# Patient Record
Sex: Female | Born: 1940
Health system: Southern US, Community
[De-identification: ages and names within clinical notes are randomized; demographics above are authoritative.]

## PROBLEM LIST (undated history)

## (undated) DIAGNOSIS — C801 Malignant (primary) neoplasm, unspecified: Secondary | ICD-10-CM

## (undated) DIAGNOSIS — C189 Malignant neoplasm of colon, unspecified: Secondary | ICD-10-CM

## (undated) DIAGNOSIS — D649 Anemia, unspecified: Secondary | ICD-10-CM

## (undated) DIAGNOSIS — Z923 Personal history of irradiation: Secondary | ICD-10-CM

## (undated) DIAGNOSIS — C50919 Malignant neoplasm of unspecified site of unspecified female breast: Secondary | ICD-10-CM

## (undated) DIAGNOSIS — C20 Malignant neoplasm of rectum: Secondary | ICD-10-CM

## (undated) DIAGNOSIS — F329 Major depressive disorder, single episode, unspecified: Secondary | ICD-10-CM

## (undated) DIAGNOSIS — Z9221 Personal history of antineoplastic chemotherapy: Secondary | ICD-10-CM

## (undated) DIAGNOSIS — F32A Depression, unspecified: Secondary | ICD-10-CM

## (undated) DIAGNOSIS — M1990 Unspecified osteoarthritis, unspecified site: Secondary | ICD-10-CM

## (undated) DIAGNOSIS — I10 Essential (primary) hypertension: Secondary | ICD-10-CM

## (undated) HISTORY — DX: Depression, unspecified: F32.A

## (undated) HISTORY — DX: Essential (primary) hypertension: I10

## (undated) HISTORY — PX: TUMOR EXCISION: SHX421

## (undated) HISTORY — PX: ABDOMINAL HYSTERECTOMY: SHX81

## (undated) HISTORY — DX: Unspecified osteoarthritis, unspecified site: M19.90

## (undated) HISTORY — DX: Major depressive disorder, single episode, unspecified: F32.9

## (undated) HISTORY — DX: Malignant neoplasm of rectum: C20

## (undated) HISTORY — DX: Anemia, unspecified: D64.9

## (undated) HISTORY — DX: Malignant (primary) neoplasm, unspecified: C80.1

---

## 1999-08-14 HISTORY — PX: MASTECTOMY MODIFIED RADICAL: SUR848

## 1999-08-14 HISTORY — PX: BREAST SURGERY: SHX581

## 2000-03-13 DIAGNOSIS — C50919 Malignant neoplasm of unspecified site of unspecified female breast: Secondary | ICD-10-CM

## 2000-03-13 HISTORY — DX: Malignant neoplasm of unspecified site of unspecified female breast: C50.919

## 2001-08-13 HISTORY — PX: MENISCUS DEBRIDEMENT: SHX5178

## 2002-04-13 LAB — HM COLONOSCOPY

## 2002-08-13 HISTORY — PX: BREAST BIOPSY: SHX20

## 2004-12-28 LAB — HM DEXA SCAN

## 2014-08-13 DIAGNOSIS — Z87898 Personal history of other specified conditions: Secondary | ICD-10-CM | POA: Diagnosis not present

## 2014-09-13 DIAGNOSIS — Z87898 Personal history of other specified conditions: Secondary | ICD-10-CM | POA: Diagnosis not present

## 2014-10-12 DIAGNOSIS — Z87898 Personal history of other specified conditions: Secondary | ICD-10-CM | POA: Diagnosis not present

## 2014-11-12 DIAGNOSIS — Z87898 Personal history of other specified conditions: Secondary | ICD-10-CM | POA: Diagnosis not present

## 2014-12-03 DIAGNOSIS — F329 Major depressive disorder, single episode, unspecified: Secondary | ICD-10-CM | POA: Diagnosis not present

## 2014-12-03 DIAGNOSIS — F909 Attention-deficit hyperactivity disorder, unspecified type: Secondary | ICD-10-CM | POA: Diagnosis not present

## 2014-12-12 DIAGNOSIS — Z87898 Personal history of other specified conditions: Secondary | ICD-10-CM | POA: Diagnosis not present

## 2015-01-06 DIAGNOSIS — H2513 Age-related nuclear cataract, bilateral: Secondary | ICD-10-CM | POA: Diagnosis not present

## 2015-01-06 DIAGNOSIS — H02229 Mechanical lagophthalmos unspecified eye, unspecified eyelid: Secondary | ICD-10-CM | POA: Diagnosis not present

## 2015-01-06 DIAGNOSIS — H524 Presbyopia: Secondary | ICD-10-CM | POA: Diagnosis not present

## 2015-01-06 DIAGNOSIS — H16213 Exposure keratoconjunctivitis, bilateral: Secondary | ICD-10-CM | POA: Diagnosis not present

## 2015-01-12 DIAGNOSIS — Z87898 Personal history of other specified conditions: Secondary | ICD-10-CM | POA: Diagnosis not present

## 2015-02-11 DIAGNOSIS — Z87898 Personal history of other specified conditions: Secondary | ICD-10-CM | POA: Diagnosis not present

## 2015-03-14 DIAGNOSIS — Z87898 Personal history of other specified conditions: Secondary | ICD-10-CM | POA: Diagnosis not present

## 2015-04-14 DIAGNOSIS — Z87898 Personal history of other specified conditions: Secondary | ICD-10-CM | POA: Diagnosis not present

## 2015-05-14 DIAGNOSIS — Z87898 Personal history of other specified conditions: Secondary | ICD-10-CM | POA: Diagnosis not present

## 2015-06-14 DIAGNOSIS — Z87898 Personal history of other specified conditions: Secondary | ICD-10-CM | POA: Diagnosis not present

## 2015-07-14 DIAGNOSIS — Z87898 Personal history of other specified conditions: Secondary | ICD-10-CM | POA: Diagnosis not present

## 2015-08-14 HISTORY — PX: CATARACT EXTRACTION: SUR2

## 2016-02-21 DIAGNOSIS — H01009 Unspecified blepharitis unspecified eye, unspecified eyelid: Secondary | ICD-10-CM | POA: Diagnosis not present

## 2016-02-21 DIAGNOSIS — H25813 Combined forms of age-related cataract, bilateral: Secondary | ICD-10-CM | POA: Diagnosis not present

## 2016-02-21 DIAGNOSIS — H1131 Conjunctival hemorrhage, right eye: Secondary | ICD-10-CM | POA: Diagnosis not present

## 2016-02-21 DIAGNOSIS — H524 Presbyopia: Secondary | ICD-10-CM | POA: Diagnosis not present

## 2016-03-19 DIAGNOSIS — H2511 Age-related nuclear cataract, right eye: Secondary | ICD-10-CM | POA: Diagnosis not present

## 2016-03-19 DIAGNOSIS — H2513 Age-related nuclear cataract, bilateral: Secondary | ICD-10-CM | POA: Diagnosis not present

## 2016-05-01 DIAGNOSIS — Z961 Presence of intraocular lens: Secondary | ICD-10-CM | POA: Diagnosis not present

## 2016-05-01 DIAGNOSIS — H2511 Age-related nuclear cataract, right eye: Secondary | ICD-10-CM | POA: Diagnosis not present

## 2016-05-02 DIAGNOSIS — H2512 Age-related nuclear cataract, left eye: Secondary | ICD-10-CM | POA: Diagnosis not present

## 2016-05-08 DIAGNOSIS — Z961 Presence of intraocular lens: Secondary | ICD-10-CM | POA: Diagnosis not present

## 2016-05-08 DIAGNOSIS — H2512 Age-related nuclear cataract, left eye: Secondary | ICD-10-CM | POA: Diagnosis not present

## 2016-05-10 ENCOUNTER — Encounter: Payer: Self-pay | Admitting: Family Medicine

## 2016-05-10 ENCOUNTER — Ambulatory Visit (INDEPENDENT_AMBULATORY_CARE_PROVIDER_SITE_OTHER): Payer: Medicare Other | Admitting: Family Medicine

## 2016-05-10 VITALS — BP 168/95 | HR 88 | Ht 62.0 in | Wt 131.2 lb

## 2016-05-10 DIAGNOSIS — F329 Major depressive disorder, single episode, unspecified: Secondary | ICD-10-CM | POA: Diagnosis not present

## 2016-05-10 DIAGNOSIS — F32A Depression, unspecified: Secondary | ICD-10-CM

## 2016-05-10 DIAGNOSIS — C801 Malignant (primary) neoplasm, unspecified: Secondary | ICD-10-CM

## 2016-05-10 DIAGNOSIS — R030 Elevated blood-pressure reading, without diagnosis of hypertension: Secondary | ICD-10-CM

## 2016-05-10 DIAGNOSIS — Q8789 Other specified congenital malformation syndromes, not elsewhere classified: Secondary | ICD-10-CM

## 2016-05-10 DIAGNOSIS — H905 Unspecified sensorineural hearing loss: Secondary | ICD-10-CM | POA: Insufficient documentation

## 2016-05-10 DIAGNOSIS — I10 Essential (primary) hypertension: Secondary | ICD-10-CM | POA: Insufficient documentation

## 2016-05-10 DIAGNOSIS — G608 Other hereditary and idiopathic neuropathies: Secondary | ICD-10-CM | POA: Insufficient documentation

## 2016-05-10 DIAGNOSIS — M25511 Pain in right shoulder: Secondary | ICD-10-CM

## 2016-05-10 DIAGNOSIS — Q789 Osteochondrodysplasia, unspecified: Secondary | ICD-10-CM

## 2016-05-10 DIAGNOSIS — M25519 Pain in unspecified shoulder: Secondary | ICD-10-CM | POA: Insufficient documentation

## 2016-05-10 DIAGNOSIS — F339 Major depressive disorder, recurrent, unspecified: Secondary | ICD-10-CM | POA: Insufficient documentation

## 2016-05-10 DIAGNOSIS — Q120 Congenital cataract: Secondary | ICD-10-CM

## 2016-05-10 MED ORDER — AMPHETAMINE-DEXTROAMPHETAMINE 10 MG PO TABS: 10.0000 mg | ORAL_TABLET | Freq: Two times a day (BID) | ORAL | 0 refills | Status: DC

## 2016-05-10 MED ORDER — SERTRALINE HCL 25 MG PO TABS: 12.5000 mg | ORAL_TABLET | Freq: Every day | ORAL | 0 refills | Status: DC

## 2016-05-10 NOTE — Assessment & Plan Note (Signed)
She has a history of right shoulder pain-chronic. Had injections in the remote past. Not too bad now. We'll readdress in the future as needed.

## 2016-05-10 NOTE — Assessment & Plan Note (Addendum)
Meds refilled. We will start slow and go up as needed.  Verbal contract entered between [place] that she will not hurt herself. If she has any of these feelings show down 911 or call our office if we are available. She agrees not to hurt herself in any way or another.  Labs ordered.

## 2016-05-10 NOTE — Patient Instructions (Addendum)
[employer]- silver sneakers program.  Denise Wolfe.   Please check BP's at home if you can  Walk daily to a goal of 38min - even if it's 44min Twice daily.     Persistent Depressive Disorder Persistent Depressive Disorder (PDD), also known as long-term (chronic) depression, is depression that lasts for at least 2 years in adults and at least 1 year in children and adolescents. PDD varies in severity from mild chronic depression (previously called dysthymia) to more severe chronic depression (previously called chronic major depressive disorder).  Mild PDD usually starts in the teenage years. People with mild PDD usually enjoy some or most aspects of their life even though they have low self-esteem and some physical symptoms of depression. Mild PDD can interfere with personal relationships, performance at school or work, and physical health. Mild PDD is not associated with thoughts of harming oneself. However, more than one half of people with mild PDD develop more severe PDD. More severe PDD usually starts in middle adulthood. It often is triggered by a negative life event (for example, loss of a loved one or loss of a job). People with more severe PDD usually have trouble enjoying life and having fun. More severe PDD usually interferes with personal relationships, school or work, and physical health. More severe PDD is often associated with thoughts or attempts of harming oneself or killing oneself (suicide). Some people with the most severe PDD lose touch with reality (psychosis). They may have false beliefs that others are dangerous (paranoid delusions). They may also see or hear things that are not real (hallucinations). CAUSES The exact cause of PDD is not known. However, often one or more of the following factors are associated with people with PDD:   A family history of depression.  Abnormally low levels of certain brain chemicals.   Traumatic events in childhood, especially the loss of a  parent or emotional abuse.  Being under a lot of stress (in the form of upsetting life experiences or losses).   History of a chronic physical illness. SYMPTOMS Symptoms of PDD vary according to the severity. A diagnosis of mild PDD requires depressed mood, most of the day more days than not, and two or more of the following symptoms:   Eating too much or not eating enough.  Low energy or fatigue.   Sleeping too much or not sleeping enough.   Feelings of hopelessness.   Difficulty thinking clearly or making decisions.   Low self-esteem.  People with more severe PDD experience depressed mood, most of the day nearly every day, and also have one or more of the following symptoms:  Inability to have fun or experience pleasure.  Feeling of restless and anxiousness rather than tiredness or fatigue.   Feelings of worthlessness or excessive guilt.  Recurring thoughts of death or suicidal ideation with or without a specific plan. DIAGNOSIS The diagnosis of PDD is based on the length of your depressive symptoms. Your caregiver will ask you questions to determine the severity of your chronic depressive symptoms. Your caregiver also may order blood tests and ask questions about your medical history and use of drugs and alcohol to rule out specific causes of your depressive symptoms. Your caregiver may refer you to a mental health care professional for further evaluation and treatment. TREATMENT Most people with PDD get better with treatment. Treatment for PDD may include medicine or talk therapy (psychotherapy). People with mild PDD often get better with psychotherapy alone. People with more severe PDD usually  do better with a combination of medicine and psychotherapy.   This information is not intended to replace advice given to you by your health care provider. Make sure you discuss any questions you have with your health care provider.   Document Released: 07/16/2012 Document  Revised: 08/20/2014 Document Reviewed: 07/16/2012 Elsevier Interactive Patient Education [employer].

## 2016-05-10 NOTE — Progress Notes (Signed)
New patient office visit note:  Impression and Recommendations:    1. Elevated blood pressure (not hypertension)   2. History of breast Cancer- radical mastectomy left ([place])   3. Depression   4. Cataract   5. Pain in joint of right shoulder      History of breast Cancer mother- died breast Ca in 22's.  Patient had breast cancer, diagnosed at age 75- status post chemo and radiation- had a radical L mastectomy   Elevated blood pressure (not hypertension) Patient denies history of hypertension, asked to check at home and we will reassess at next office visit. Especially since patient is very emotional in the office today, likely mediated by emotions and sympathetic response.  Depression Meds refilled. We will start slow and go up as needed.  Labs ordered.  The patient was counseled, risk factors were discussed, anticipatory guidance given.  Gross side effects, risk and benefits, and alternatives of medications discussed with patient.  Patient is aware that all medications have potential side effects and we are unable to predict every side effect or drug-drug interaction that may occur.  Expresses verbal understanding and consents to current therapy plan and treatment regimen.  Orders Placed This Encounter  Procedures  . CBC with Differential/Platelet  . COMPLETE METABOLIC PANEL WITH GFR  . Lipid panel  . Hemoglobin A1c  . T4, free  . TSH  . VITAMIN D 25 Hydroxy (Vit-D Deficiency, Fractures)  . Vitamin B12    New Prescriptions   AMPHETAMINE-DEXTROAMPHETAMINE (ADDERALL) 10 MG TABLET    Take 1 tablet (10 mg total) by mouth 2 (two) times daily.   SERTRALINE (ZOLOFT) 25 MG TABLET    Take 0.5 tablets (12.5 mg total) by mouth daily. For one week then inc to 1 tab QD    Modified Medications   No medications on file    Discontinued Medications   No medications on file    Return in about 4 weeks (around 06/07/2016) for come for fastign wrk-bldwrk and then OV with  me 4wks current medical issues- reck BP and mood meds.  Please see AVS handed out to patient at the end of our visit for further patient instructions/ counseling done pertaining to today's office visit.    Note: This document was prepared using Dragon voice recognition software and may include unintentional dictation errors.  ----------------------------------------------------------------------------------------------------------------------    Subjective:   Chief complaint: Depression  HPI: Denise Wolfe is a pleasant 75 y.o. female who presents to [place] today to review their medical history with me and establish care.   I asked the patient to review their chronic problem list with me to ensure everything was updated and accurate.    From [place]- married 34 yrs- lost husband April '16; lives alone, retird fashion designer  Patient denies she ever had a history of hypertension in the past.  Severely Depressed: cbt- in past for 4-5 yrs---> many yrs ago.  Has a long history of depression --> multiple different meds---> been through several- not sure of the names.  Last she can recall she was on Sertraline 100mg  QD and adderall 10 mg twice a day to help with energy since she did not even feel like getting out of bed. At least the Adderall did help and motivate her to want to move/ to "get up and go".   None since APril '17.  She has been worsening in her depression especially within 8 weeks  after stopping the medicine. She actually did have one thought of killing herself, driving into the middle of a train track and waiting to the train cane. However this was fleeting and patient denies that she has these thoughts currently.  PHQ9 performed  Depression screen PHQ 2/9 05/10/2016  Decreased Interest 3  Down, Depressed, Hopeless 3  PHQ - 2 Score 6  Altered sleeping 3  Tired, decreased energy 3  Change in appetite 3  Feeling bad or failure about yourself  0    Trouble concentrating 3  Moving slowly or fidgety/restless 0  Suicidal thoughts 1  PHQ-9 Score 19  Difficult doing work/chores Somewhat difficult    PCP- Dr Sharilyn Sites- [place], [place],     Wt Readings from Last 3 Encounters:  05/10/16 131 lb 3.2 oz (59.5 kg)   BP Readings from Last 3 Encounters:  05/10/16 (!) 168/95   Pulse Readings from Last 3 Encounters:  05/10/16 88   BMI Readings from Last 3 Encounters:  05/10/16 24.00 kg/m    Patient Active Problem List   Diagnosis Date Noted  . History of breast Cancer 05/10/2016  . Depression 05/10/2016  . Cataract 05/10/2016  . Pain in joint, shoulder region 05/10/2016  . Elevated blood pressure (not hypertension) 05/10/2016     Past Medical History:  Diagnosis Date  . Anemia    during childhood  . Cancer [facility])    breast, age 19  . Depression      Past Surgical History:  Procedure Laterality Date  . ABDOMINAL HYSTERECTOMY    . BREAST SURGERY    . CATARACT EXTRACTION Bilateral   . MASTECTOMY MODIFIED RADICAL Left   . MENISCUS DEBRIDEMENT Right   . TUMOR EXCISION Right    thigh area     Family History  Problem Relation Age of Onset  . Cancer Mother 62    breast  . Heart disease Father      History  Drug Use No    History  Alcohol Use  . 8.4 oz/week  . 14 Glasses of wine per week    History  Smoking Status  . Never Smoker  Smokeless Tobacco  . Never Used    Patient's Medications  New Prescriptions   AMPHETAMINE-DEXTROAMPHETAMINE (ADDERALL) 10 MG TABLET    Take 1 tablet (10 mg total) by mouth 2 (two) times daily.   SERTRALINE (ZOLOFT) 25 MG TABLET    Take 0.5 tablets (12.5 mg total) by mouth daily. For one week then inc to 1 tab QD  Previous Medications   BESIVANCE 0.6 % SUSP    Place 1 drop into both eyes 3 (three) times daily.   DUREZOL 0.05 % EMUL    Place 1 drop into both eyes 3 (three) times daily.   PROLENSA 0.07 % SOLN    Place 1 drop into both eyes at bedtime.  Modified Medications    No medications on file  Discontinued Medications   No medications on file    Allergies: Ibuprofen  Review of Systems  Constitutional: Positive for weight loss. Negative for chills, diaphoresis, fever and malaise/fatigue.  HENT: Negative.  Negative for congestion, sore throat and tinnitus.   Eyes: Negative.  Negative for blurred vision, double vision and photophobia.  Respiratory: Negative.  Negative for cough and wheezing.   Cardiovascular: Negative.  Negative for chest pain and palpitations.  Gastrointestinal: Negative.  Negative for blood in stool, diarrhea, nausea and vomiting.  Genitourinary: Negative.  Negative for dysuria, frequency and urgency.  Musculoskeletal: Positive for joint pain and myalgias.  Skin: Negative.  Negative for itching and rash.  Neurological: Negative.  Negative for dizziness, focal weakness, weakness and headaches.  Endo/Heme/Allergies: Negative.  Negative for environmental allergies and polydipsia. Does not bruise/bleed easily.  Psychiatric/Behavioral: Positive for depression. Negative for memory loss. The patient is not nervous/anxious and does not have insomnia.        Sleep disturbance     Objective:   Blood pressure (!) 168/95, pulse 88, height 5\' 2"  (1.575 m), weight 131 lb 3.2 oz (59.5 kg). Body mass index is 24 kg/m. General: Well Developed, well nourished, and in no acute distress.  Neuro: Alert and oriented x3, extra-ocular muscles intact, sensation grossly intact.  HEENT: Normocephalic, atraumatic, pupils equal round reactive to light, neck supple Skin: no gross suspicious lesions or rashes  Cardiac: Regular rate and rhythm, no murmurs rubs or gallops.  Respiratory: Essentially clear to auscultation bilaterally. Not using accessory muscles, speaking in full sentences.  Abdominal: Soft, not grossly distended Musculoskeletal: Ambulates w/o diff, FROM * 4 ext.  Vasc: less 2 sec cap RF, warm and pink  Psych:  No HI/SI, depressed mood,  tearful in the office, flat affect

## 2016-05-10 NOTE — Assessment & Plan Note (Addendum)
mother- died breast Ca in 31's.  Patient had breast cancer, diagnosed at age 75- status post chemo and radiation- had a radical L mastectomy

## 2016-05-10 NOTE — Assessment & Plan Note (Signed)
Patient denies history of hypertension, asked to check at home and we will reassess at next office visit. Especially since patient is very emotional in the office today, likely mediated by emotions and sympathetic response.

## 2016-05-10 NOTE — Assessment & Plan Note (Deleted)
cbt- in past for 4-5 yrs---> many yrs ago.   meds---> been through several-  Sertraline 100mg  QD and adderall.  None since APril.

## 2016-07-11 ENCOUNTER — Other Ambulatory Visit (INDEPENDENT_AMBULATORY_CARE_PROVIDER_SITE_OTHER): Payer: Medicare Other

## 2016-07-11 DIAGNOSIS — C801 Malignant (primary) neoplasm, unspecified: Secondary | ICD-10-CM

## 2016-07-11 DIAGNOSIS — F329 Major depressive disorder, single episode, unspecified: Secondary | ICD-10-CM

## 2016-07-11 DIAGNOSIS — E559 Vitamin D deficiency, unspecified: Secondary | ICD-10-CM | POA: Diagnosis not present

## 2016-07-11 DIAGNOSIS — F32A Depression, unspecified: Secondary | ICD-10-CM

## 2016-07-11 DIAGNOSIS — E785 Hyperlipidemia, unspecified: Secondary | ICD-10-CM | POA: Diagnosis not present

## 2016-07-11 LAB — CBC WITH DIFFERENTIAL/PLATELET
BASOS ABS: 46 {cells}/uL (ref 0–200)
BASOS PCT: 1 %
EOS ABS: 138 {cells}/uL (ref 15–500)
EOS PCT: 3 %
HCT: 42.7 % (ref 35.0–45.0)
Hemoglobin: 14.8 g/dL (ref 11.7–15.5)
LYMPHS PCT: 34 %
Lymphs Abs: 1564 cells/uL (ref 850–3900)
MCH: 31.8 pg (ref 27.0–33.0)
MCHC: 34.7 g/dL (ref 32.0–36.0)
MCV: 91.8 fL (ref 80.0–100.0)
MONOS PCT: 11 %
MPV: 9.8 fL (ref 7.5–12.5)
Monocytes Absolute: 506 cells/uL (ref 200–950)
Neutro Abs: 2346 cells/uL (ref 1500–7800)
Neutrophils Relative %: 51 %
PLATELETS: 348 10*3/uL (ref 140–400)
RBC: 4.65 MIL/uL (ref 3.80–5.10)
RDW: 13.4 % (ref 11.0–15.0)
WBC: 4.6 10*3/uL (ref 3.8–10.8)

## 2016-07-12 LAB — COMPLETE METABOLIC PANEL WITH GFR
ALT: 10 U/L (ref 6–29)
AST: 15 U/L (ref 10–35)
Albumin: 4.5 g/dL (ref 3.6–5.1)
Alkaline Phosphatase: 45 U/L (ref 33–130)
BUN: 20 mg/dL (ref 7–25)
CHLORIDE: 105 mmol/L (ref 98–110)
CO2: 26 mmol/L (ref 20–31)
Calcium: 9.7 mg/dL (ref 8.6–10.4)
Creat: 0.76 mg/dL (ref 0.60–0.93)
GFR, EST NON AFRICAN AMERICAN: 78 mL/min (ref >=60)
Glucose, Bld: 89 mg/dL (ref 65–99)
Potassium: 4.4 mmol/L (ref 3.5–5.3)
SODIUM: 140 mmol/L (ref 135–146)
Total Bilirubin: 0.4 mg/dL (ref 0.2–1.2)
Total Protein: 7.1 g/dL (ref 6.1–8.1)

## 2016-07-12 LAB — VITAMIN D 25 HYDROXY (VIT D DEFICIENCY, FRACTURES): VIT D 25 HYDROXY: 29 ng/mL — AB (ref 30–100)

## 2016-07-12 LAB — TSH: TSH: 2.57 mIU/L

## 2016-07-12 LAB — LIPID PANEL
CHOL/HDL RATIO: 2.9 ratio (ref <5.0)
Cholesterol: 272 mg/dL — ABNORMAL HIGH (ref <200)
HDL: 95 mg/dL (ref >50)
LDL CALC: 167 mg/dL — AB (ref <100)
TRIGLYCERIDES: 51 mg/dL (ref <150)
VLDL: 10 mg/dL (ref <30)

## 2016-07-12 LAB — T4, FREE: FREE T4: 1.1 ng/dL (ref 0.8–1.8)

## 2016-07-12 LAB — HEMOGLOBIN A1C
Hgb A1c MFr Bld: 5.1 % (ref <5.7)
Mean Plasma Glucose: 100 mg/dL

## 2016-07-12 LAB — VITAMIN B12: Vitamin B-12: 389 pg/mL (ref 200–1100)

## 2016-07-19 ENCOUNTER — Encounter: Payer: Self-pay | Admitting: Family Medicine

## 2016-07-19 ENCOUNTER — Ambulatory Visit (INDEPENDENT_AMBULATORY_CARE_PROVIDER_SITE_OTHER): Payer: Medicare Other | Admitting: Family Medicine

## 2016-07-19 VITALS — BP 177/90 | HR 74 | Ht 62.0 in | Wt 133.6 lb

## 2016-07-19 DIAGNOSIS — E7800 Pure hypercholesterolemia, unspecified: Secondary | ICD-10-CM | POA: Diagnosis not present

## 2016-07-19 DIAGNOSIS — R079 Chest pain, unspecified: Secondary | ICD-10-CM

## 2016-07-19 DIAGNOSIS — Z87898 Personal history of other specified conditions: Secondary | ICD-10-CM | POA: Diagnosis not present

## 2016-07-19 DIAGNOSIS — E785 Hyperlipidemia, unspecified: Secondary | ICD-10-CM | POA: Insufficient documentation

## 2016-07-19 DIAGNOSIS — E559 Vitamin D deficiency, unspecified: Secondary | ICD-10-CM | POA: Diagnosis not present

## 2016-07-19 DIAGNOSIS — I10 Essential (primary) hypertension: Secondary | ICD-10-CM

## 2016-07-19 DIAGNOSIS — F32A Depression, unspecified: Secondary | ICD-10-CM

## 2016-07-19 DIAGNOSIS — E782 Mixed hyperlipidemia: Secondary | ICD-10-CM | POA: Diagnosis not present

## 2016-07-19 DIAGNOSIS — M19011 Primary osteoarthritis, right shoulder: Secondary | ICD-10-CM | POA: Insufficient documentation

## 2016-07-19 DIAGNOSIS — F329 Major depressive disorder, single episode, unspecified: Secondary | ICD-10-CM

## 2016-07-19 DIAGNOSIS — Z23 Encounter for immunization: Secondary | ICD-10-CM

## 2016-07-19 DIAGNOSIS — M25511 Pain in right shoulder: Secondary | ICD-10-CM

## 2016-07-19 DIAGNOSIS — E7889 Other lipoprotein metabolism disorders: Secondary | ICD-10-CM | POA: Insufficient documentation

## 2016-07-19 MED ORDER — ATORVASTATIN CALCIUM 80 MG PO TABS: 40.0000 mg | ORAL_TABLET | Freq: Every day | ORAL | 0 refills | Status: DC

## 2016-07-19 MED ORDER — LOSARTAN POTASSIUM-HCTZ 50-12.5 MG PO TABS: ORAL_TABLET | ORAL | 0 refills | Status: DC

## 2016-07-19 MED ORDER — SERTRALINE HCL 50 MG PO TABS: 50.0000 mg | ORAL_TABLET | Freq: Every day | ORAL | 1 refills | Status: DC

## 2016-07-19 MED ORDER — AMPHETAMINE-DEXTROAMPHETAMINE 10 MG PO TABS: 10.0000 mg | ORAL_TABLET | Freq: Two times a day (BID) | ORAL | 0 refills | Status: DC

## 2016-07-19 NOTE — Patient Instructions (Addendum)
Goal blood pressure is to come down slowly to under 140/90 on a regular basis.   Please check her blood pressure at home after sitting quietly for 15-20 minutes.   Write it down. Do not go up on your blood pressure medicine if a half a tablet brings it down into your goal region.   Call with any questions concerns

## 2016-07-19 NOTE — Progress Notes (Signed)
Assessment and plan:  1. Pain in joint of right shoulder   2. Degenerative joint disease (DJD) of sternoclavicular joint, right   3. Vitamin D insufficiency   4. Pure hypercholesterolemia   5. HTN (hypertension), benign   6. Retrosternal chest pain   7. Need for prophylactic vaccination and inoculation against influenza   8. Elevated HDL:  >er 90   9. Nonspecific chest pain   10. Depression, unspecified depression type       HLD (hyperlipidemia) Patient's 10 year risk per first heart attack or stroke with blood pressure 122/72 and taking medicines is 16%.  With blood pressure as it is today at 170s or more->   current risk is over 30%.  --- We will start blood pressure medicines and cholesterol medicines.  --> Lipitor 40 q hs to start--> inc to 80 as tolerated  --- Risks and benefits discussed with patient.     Retrosternal chest pain HPI:    Upon leaving office today, pt mentions that she has "some chest pains that she would like to talk to me about".  - Non-exertional.   Not currently having.  Occurs with sedentary activities- sit/ lying around.  Occurring sev months now- ever since husband died  - Plan: - EKG in office today- reassurring and w/o ABN. - pt will consider ESE in future- she wants to think about it - Red flag sx d/c pt; if sx occur dial --> 911     Depression Last OV- restarted pt on her meds.    Mood- now much more stable; more energy but would like to inc dose.  Changed from Zoloft 25 to '50MG'$  today.   -  Rec CBT, [employer]/ exercise and community socialization activities      Elevated HDL:  >er 90 Importance of keeping active and HDL up d/c pt.    Counseling done  All questions answered  Handouts given if patient desired them      HTN (hypertension), benign Start meds- Hyzaar- 1/2 tab po qd--> slwly inc as tolerated to goal bp less than 140/90    Also, risks and  benefits of medications discussed with patient, including alternative treatments.   Encouraged patient to read drug information handouts to further educate self about the medicine prior to starting it.   Lifestyle changes such as dash diet and engaging in a regular exercise program discussed with patient.  -  Educational handouts provided  Ambulatory BP monitoring encouraged. Keep log and bring in next OV     Pain in joint, shoulder region After d/c pt, she wishes to go to a Sports Med/ Ortho doc for further eval and txmnt.  Obtain x-ray to help expedite to px at sports med office.   - referral placed today to sprts med- [place]     Degenerative joint disease (DJD) of sternoclavicular joint, right OTC medicines and ICE shoulder as often as 15-54mn every hour d/c pt.  Declined PT today- says she will d/c sprts med/ Ortho      Vitamin D insufficiency Importance of supplementation of at least 50 K IU vitamin D3 on a daily basis and 1200 of calcium daily discussed with patient.  Advised she come in in the near future for yearly physical in which we would discuss screening tests including bone density etc.     Stressed importance.      New Prescriptions   ATORVASTATIN (LIPITOR) 80 MG TABLET    Take 0.5 tablets (  40 mg total) by mouth daily at 6 PM.   LOSARTAN-HYDROCHLOROTHIAZIDE (HYZAAR) 50-12.5 MG TABLET    Start with one half tablet daily for 1 week, then 1 full tablet daily    Modified Medications   Modified Medication Previous Medication   AMPHETAMINE-DEXTROAMPHETAMINE (ADDERALL) 10 MG TABLET amphetamine-dextroamphetamine (ADDERALL) 10 MG tablet      Take 1 tablet (10 mg total) by mouth 2 (two) times daily.    Take 1 tablet (10 mg total) by mouth 2 (two) times daily.   SERTRALINE (ZOLOFT) 50 MG TABLET sertraline (ZOLOFT) 25 MG tablet      Take 1 tablet (50 mg total) by mouth daily.    Take 25 mg by mouth daily.    Discontinued Medications   BESIVANCE 0.6 % SUSP    Place  1 drop into both eyes 3 (three) times daily.   DUREZOL 0.05 % EMUL    Place 1 drop into both eyes 3 (three) times daily.   PROLENSA 0.07 % SOLN    Place 1 drop into both eyes at bedtime.   SERTRALINE (ZOLOFT) 25 MG TABLET    Take 0.5 tablets (12.5 mg total) by mouth daily. For one week then inc to 1 tab QD     Return for 2-4wks for BP reck, after starting BP and chol med today.     Anticipatory guidance and routine counseling done re: condition, txmnt options and need for follow up. All questions of patient's were answered.   Gross side effects, risk and benefits, and alternatives of medications discussed with patient.  Patient is aware that all medications have potential side effects and we are unable to predict every sideeffect or drug-drug interaction that may occur.  Expresses verbal understanding and consents to current therapy plan and treatment regiment.  Please see AVS handed out to patient at the end of our visit for additional patient instructions/ counseling done pertaining to today's office visit.  Note: This document was prepared using Dragon voice recognition software and may include unintentional dictation errors.   ----------------------------------------------------------------------------------------------------------------------  Subjective:   CC:   Denise Wolfe is a 75 y.o. female who presents to [facility] today for review and discussion of recent bloodwork that was done and review of new meds that were started last OV--> 1st time with me.   1. All recent blood work that we ordered was reviewed with patient today.  Patient was counseled on all abnormalities and we discussed dietary and lifestyle changes that could help those values (also medications when appropriate).  Extensive health counseling performed and all patient's concerns/ questions were addressed.   2. Mood-  Last ov 05/10/16 started her back on her zoloft and adderall.  Doing  much better.  Still has not painted nor exercised / nor gone to the [facility];  Nor has she sought out counselor but doing better.  3. R shoulder pain and degeneration-  had many yrs now.  Reports steroid shots have helped a lot in the past.    Pain with overhead activities and esp pain at night.   She had an MRI 6-7 yrs ago- I do not have those results.  No loss strength/ fxn or  sensory abn.  4. BP - still above goal, pt ASX and telling me it has to do with her mood, shoulder pain etc.     Wt Readings from Last 3 Encounters:  08/08/16 132 lb 8 oz (60.1 kg)  07/24/16 133 lb (60.3 kg)  07/19/16 133 lb 9.6 oz (60.6 kg)   BP Readings from Last 3 Encounters:  08/08/16 (!) 153/84  07/24/16 (!) 167/79  07/19/16 (!) 177/90   Pulse Readings from Last 3 Encounters:  08/08/16 85  07/24/16 73  07/19/16 74   BMI Readings from Last 3 Encounters:  08/08/16 24.23 kg/m  07/24/16 24.33 kg/m  07/19/16 24.44 kg/m     Patient Care Team    Relationship Specialty Notifications Start End  [name], DO PCP - General Family Medicine  05/10/16   Silverio Decamp, MD Consulting Physician Family Medicine  07/25/16    Comment: OA - shoulder injeciton    Full medical history updated and reviewed in the office today  Patient Active Problem List   Diagnosis Date Noted  . HLD (hyperlipidemia) 07/19/2016    Priority: High  . Elevated HDL:  >er 90 07/19/2016    Priority: High  . History of breast Cancer 05/10/2016    Priority: High  . HTN (hypertension), benign 05/10/2016    Priority: High  . 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 Depression 05/10/2016    Priority: Medium  . Vitamin D insufficiency 07/19/2016    Priority: Low  . Degenerative joint disease (DJD) of sternoclavicular joint, right 07/19/2016    Priority: Low  . Pain in joint, shoulder region 05/10/2016    Priority: Low  . Nonspecific chest pain 08/08/2016  . Retrosternal chest pain  07/19/2016  . Cataract 05/10/2016    Past Medical History:  Diagnosis Date  . Anemia    during childhood  . Cancer [facility])    breast, age 66  . Depression     Past Surgical History:  Procedure Laterality Date  . ABDOMINAL HYSTERECTOMY     With bilateral oophorectomy. 2002.  [facility] Kitchen BREAST SURGERY    . CATARACT EXTRACTION Bilateral   . MASTECTOMY MODIFIED RADICAL Left   . MENISCUS DEBRIDEMENT Right   . TUMOR EXCISION Right    thigh area    Social History  Substance Use Topics  . Smoking status: Never Smoker  . Smokeless tobacco: Never Used  . Alcohol use 8.4 oz/week    14 Glasses of wine per week    Family Hx: Family History  Problem Relation Age of Onset  . Cancer Mother 38    breast  . Heart disease Father      Medications: Current Outpatient Prescriptions  Medication Sig Dispense Refill  . amphetamine-dextroamphetamine (ADDERALL) 10 MG tablet Take 1 tablet (10 mg total) by mouth 2 (two) times daily. 60 tablet 0  . amLODipine (NORVASC) 5 MG tablet Take 1 tablet (5 mg total) by mouth daily. 90 tablet 3  . atorvastatin (LIPITOR) 80 MG tablet Take 0.5 tablets (40 mg total) by mouth daily at 6 PM. 45 tablet 3  . losartan-hydrochlorothiazide (HYZAAR) 50-12.5 MG tablet Start with one half tablet daily for 1 week, then 1 full tablet daily 90 tablet 3  . Multiple Vitamins-Minerals (MULTIVITAMIN WOMEN 50+) TABS Take 1 tablet by mouth daily.    . sertraline (ZOLOFT) 50 MG tablet Take 1 tablet (50 mg total) by mouth daily. 90 tablet 3  . Vitamin D, Ergocalciferol, (DRISDOL) 50000 units CAPS capsule Take 1 capsule (50,000 Units total) by mouth every 7 (seven) days. 12 capsule 10   No current facility-administered medications for this visit.     Allergies:  Allergies  Allergen Reactions  . Ibuprofen Rash     ROS: Review  of Systems  Constitutional: Negative for chills and fever.  HENT: Negative for congestion, ear discharge and sinus pain.   Eyes: Negative for blurred  vision and double vision.  Respiratory: Negative for cough, shortness of breath and wheezing.   Cardiovascular: Positive for chest pain. Negative for palpitations, orthopnea, claudication, leg swelling and PND.  Gastrointestinal: Negative for abdominal pain, blood in stool, constipation, diarrhea, heartburn, nausea and vomiting.  Genitourinary: Negative for frequency and hematuria.  Musculoskeletal: Positive for joint pain. Negative for falls.       Chronic jt pains  Skin: Negative for rash.  Neurological: Negative for dizziness, sensory change, focal weakness and loss of consciousness.  Endo/Heme/Allergies: Negative for polydipsia.  Psychiatric/Behavioral: Positive for depression. Negative for suicidal ideas. The patient is nervous/anxious.    Objective:  Blood pressure (!) 177/90, pulse 74, height '5\' 2"'$  (1.575 m), weight 133 lb 9.6 oz (60.6 kg). Body mass index is 24.44 kg/m. Gen:   Well NAD, A and O *3 HEENT:    [place]/AT, EOMI,  MMM, OP- clr, no bruits b/l Lungs:   Normal work of breathing. CTA B/L, no Wh, rhonchi Heart:   RRR, S1, S2 WNL's, no MRG Abd:   No gross distention Exts:    warm, pink,  Brisk capillary refill, warm and well perfused.  Psych:    No HI/SI, judgement and insight good, Euthymic mood. Full Affect. Shoulder: Inspection reveals no abnormalities, atrophy or asymmetry. Palpation is normal with no tenderness over AC joint, supraspinatus tendon  insertion point or bicipital groove. ROM is essentially full in all planes. + signs of impingement with + Neer and Hawkin's tests,  + sings of supraspinatus tendon instability with + empty can  No biceps tendon pathology with Negative Speed's test Some ABN scapular function observed, with dec reach of affected shoulder. No painful arc and no drop arm sign.    Recent Results (from the past 2160 hour(s))  CBC with Differential/Platelet     Status: None   Collection Time: 07/11/16  9:23 AM  Result Value Ref Range   WBC 4.6  3.8 - 10.8 K/uL   RBC 4.65 3.80 - 5.10 MIL/uL   Hemoglobin 14.8 11.7 - 15.5 g/dL   HCT 42.7 35.0 - 45.0 %   MCV 91.8 80.0 - 100.0 fL   MCH 31.8 27.0 - 33.0 pg   MCHC 34.7 32.0 - 36.0 g/dL   RDW 13.4 11.0 - 15.0 %   Platelets 348 140 - 400 K/uL   MPV 9.8 7.5 - 12.5 fL   Neutro Abs 2,346 1,500 - 7,800 cells/uL   Lymphs Abs 1,564 850 - 3,900 cells/uL   Monocytes Absolute 506 200 - 950 cells/uL   Eosinophils Absolute 138 15 - 500 cells/uL   Basophils Absolute 46 0 - 200 cells/uL   Neutrophils Relative % 51 %   Lymphocytes Relative 34 %   Monocytes Relative 11 %   Eosinophils Relative 3 %   Basophils Relative 1 %   Smear Review Criteria for review not met   COMPLETE METABOLIC PANEL WITH GFR     Status: None   Collection Time: 07/11/16  9:23 AM  Result Value Ref Range   Sodium 140 135 - 146 mmol/L   Potassium 4.4 3.5 - 5.3 mmol/L   Chloride 105 98 - 110 mmol/L   CO2 26 20 - 31 mmol/L   Glucose, Bld 89 65 - 99 mg/dL   BUN 20 7 - 25 mg/dL   Creat 0.76 0.60 -  0.93 mg/dL    Comment:   For patients > or = 73 years of age: The upper reference limit for Creatinine is approximately 13% higher for people identified as African-American.      Total Bilirubin 0.4 0.2 - 1.2 mg/dL   Alkaline Phosphatase 45 33 - 130 U/L   AST 15 10 - 35 U/L   ALT 10 6 - 29 U/L   Total Protein 7.1 6.1 - 8.1 g/dL   Albumin 4.5 3.6 - 5.1 g/dL   Calcium 9.7 8.6 - 10.4 mg/dL   GFR, Est African American >89 >=60 mL/min   GFR, Est Non African American 78 >=60 mL/min  Lipid panel     Status: Abnormal   Collection Time: 07/11/16  9:23 AM  Result Value Ref Range   Cholesterol 272 (H) <200 mg/dL    Comment: ** Please note change in reference range(s). **      Triglycerides 51 <150 mg/dL    Comment: ** Please note change in reference range(s). **      HDL 95 >50 mg/dL    Comment: ** Please note change in reference range(s). **      Total CHOL/HDL Ratio 2.9 <5.0 Ratio   VLDL 10 <30 mg/dL   LDL  Cholesterol 167 (H) <100 mg/dL    Comment: ** Please note change in reference range(s). **     Hemoglobin A1c     Status: None   Collection Time: 07/11/16  9:23 AM  Result Value Ref Range   Hgb A1c MFr Bld 5.1 <5.7 %    Comment:   For the purpose of screening for the presence of diabetes:   <5.7%       Consistent with the absence of diabetes 5.7-6.4 %   Consistent with increased risk for diabetes (prediabetes) >=6.5 %     Consistent with diabetes   This assay result is consistent with a decreased risk of diabetes.   Currently, no consensus exists regarding use of hemoglobin A1c for diagnosis of diabetes in children.   According to American Diabetes Association (ADA) guidelines, hemoglobin A1c <7.0% represents optimal control in non-pregnant diabetic patients. Different metrics may apply to specific patient populations. Standards of Medical Care in Diabetes (ADA).      Mean Plasma Glucose 100 mg/dL  T4, free     Status: None   Collection Time: 07/11/16  9:23 AM  Result Value Ref Range   Free T4 1.1 0.8 - 1.8 ng/dL  TSH     Status: None   Collection Time: 07/11/16  9:23 AM  Result Value Ref Range   TSH 2.57 mIU/L    Comment:   Reference Range   > or = 20 Years  0.40-4.50   Pregnancy Range First trimester  0.26-2.66 Second trimester 0.55-2.73 Third trimester  0.43-2.91     VITAMIN D 25 Hydroxy (Vit-D Deficiency, Fractures)     Status: Abnormal   Collection Time: 07/11/16  9:23 AM  Result Value Ref Range   Vit D, 25-Hydroxy 29 (L) 30 - 100 ng/mL    Comment: Vitamin D Status           25-OH Vitamin D        Deficiency                <20 ng/mL        Insufficiency         20 - 29 ng/mL        Optimal             >  or = 30 ng/mL   For 25-OH Vitamin D testing on patients on D2-supplementation and patients for whom quantitation of D2 and D3 fractions is required, the QuestAssureD 25-OH VIT D, (D2,D3), LC/MS/MS is recommended: order code [phone] (patients > 2 yrs).     Vitamin B12     Status: None   Collection Time: 07/11/16  9:23 AM  Result Value Ref Range   Vitamin B-12 389 200 - 1,100 pg/mL

## 2016-07-19 NOTE — Assessment & Plan Note (Addendum)
Patient's 10 year risk per first heart attack or stroke with blood pressure 122/72 and taking medicines is 16%.  With blood pressure as it is today at 170s or more->   current risk is over 30%.  --- We will start blood pressure medicines and cholesterol medicines.  --> Lipitor 40 q hs to start--> inc to 80 as tolerated  --- Risks and benefits discussed with patient.

## 2016-07-19 NOTE — Assessment & Plan Note (Addendum)
HPI:    Upon leaving office today, pt mentions that she has "some chest pains that she would like to talk to me about".  - Non-exertional.   Not currently having.  Occurs with sedentary activities- sit/ lying around.  Occurring sev months now- ever since husband died  - Plan: - EKG in office today- reassurring and w/o ABN. - pt will consider ESE in future- she wants to think about it - Red flag sx d/c pt; if sx occur dial --> 911

## 2016-07-24 ENCOUNTER — Encounter: Payer: Self-pay | Admitting: Sports Medicine

## 2016-07-24 ENCOUNTER — Ambulatory Visit (INDEPENDENT_AMBULATORY_CARE_PROVIDER_SITE_OTHER): Payer: Medicare Other | Admitting: Sports Medicine

## 2016-07-24 ENCOUNTER — Ambulatory Visit (INDEPENDENT_AMBULATORY_CARE_PROVIDER_SITE_OTHER): Payer: Medicare Other

## 2016-07-24 DIAGNOSIS — M25511 Pain in right shoulder: Secondary | ICD-10-CM

## 2016-07-24 DIAGNOSIS — M25811 Other specified joint disorders, right shoulder: Secondary | ICD-10-CM | POA: Diagnosis not present

## 2016-07-24 DIAGNOSIS — M19011 Primary osteoarthritis, right shoulder: Secondary | ICD-10-CM

## 2016-07-24 NOTE — Progress Notes (Signed)
Subjective:    I'm seeing this patient as a consultation for:  Dr. Raliegh Scarlet  CC:  Right Shoulder pain  HPI: This is a pleasant 75 year old female, she comes in with a years long history of pain that she localizes over the anterior joint line of her right shoulder, also over the deltoid and worse with overhead activities. She has had a progressive decrease in her ability to achieve full range of motion. Pain is moderate, persistent, no radiation down past the elbow, no paresthesias into the hand. She desires interventional treatment today.  Past medical history:  Negative.  See flowsheet/record as well for more information.  Surgical history: Negative.  See flowsheet/record as well for more information.  Family history: Negative.  See flowsheet/record as well for more information.  Social history: Negative.  See flowsheet/record as well for more information.  Allergies, and medications have been entered into the medical record, reviewed, and no changes needed.   Review of Systems: No headache, visual changes, nausea, vomiting, diarrhea, constipation, dizziness, abdominal pain, skin rash, fevers, chills, night sweats, weight loss, swollen lymph nodes, body aches, joint swelling, muscle aches, chest pain, shortness of breath, mood changes, visual or auditory hallucinations.   Objective:   General: Well Developed, well nourished, and in no acute distress.  Neuro/Psych: Alert and oriented x3, extra-ocular muscles intact, able to move all 4 extremities, sensation grossly intact. Skin: Warm and dry, no rashes noted.  Respiratory: Not using accessory muscles, speaking in full sentences, trachea midline.  Cardiovascular: Pulses palpable, no extremity edema. Abdomen: Does not appear distended. Right Shoulder: Inspection reveals no abnormalities, atrophy or asymmetry. Palpation is normal with no tenderness over AC joint or bicipital groove. ROM is full in all planes. Rotator cuff strength normal  throughout. Positive Neer and Hawkin's tests, empty can. Speeds and Yergason's tests normal. Positive Obrien's, positive crank, negative clunk, and good stability. Normal scapular function observed. No painful arc and no drop arm sign. No apprehension sign  Procedure: Real-time Ultrasound Guided Injection of right subacromial bursa Device: GE Logiq E  Verbal informed consent obtained.  Time-out conducted.  Noted no overlying erythema, induration, or other signs of local infection.  Skin prepped in a sterile fashion.  Local anesthesia: Topical Ethyl chloride.  With sterile technique and under real time ultrasound guidance:  1 mL kenalog 40, 1 mL lidocaine, 1 mL Marcaine injected easily into the bursa taking care to avoid injection into the rotator cuff. Completed without difficulty  Pain immediately resolved suggesting accurate placement of the medication.  Advised to call if fevers/chills, erythema, induration, drainage, or persistent bleeding.  Images permanently stored and available for review in the ultrasound unit.  Impression: Technically successful ultrasound guided injection.  Procedure: Real-time Ultrasound Guided Injection of right glenohumeral joint Device: GE Logiq E  Verbal informed consent obtained.  Time-out conducted.  Noted no overlying erythema, induration, or other signs of local infection.  Skin prepped in a sterile fashion.  Local anesthesia: Topical Ethyl chloride.  With sterile technique and under real time ultrasound guidance:  Using a 22-gauge spinal needle advanced into the glenohumeral joint in care to avoid the labrum. 1 mL kenalog 40, 2 mL lidocaine, 2 mL Marcaine was injected easily. Completed without difficulty  Pain immediately resolved suggesting accurate placement of the medication.  Advised to call if fevers/chills, erythema, induration, drainage, or persistent bleeding.  Images permanently stored and available for review in the ultrasound unit.    Impression: Technically successful ultrasound guided injection.  Impression  and Recommendations:   This case required medical decision making of moderate complexity.  Pain in joint, shoulder region Patient has some signs of adhesive capsulitis but also shoulder impingement. Injections placed into the joint and the subacromial bursa. X-rays. Formal physical therapy. Return to see me in one month.

## 2016-07-24 NOTE — Assessment & Plan Note (Signed)
Patient has some signs of adhesive capsulitis but also shoulder impingement. Injections placed into the joint and the subacromial bursa. X-rays. Formal physical therapy. Return to see me in one month.

## 2016-08-03 ENCOUNTER — Ambulatory Visit (INDEPENDENT_AMBULATORY_CARE_PROVIDER_SITE_OTHER): Payer: Medicare Other | Admitting: Physical Therapy

## 2016-08-03 ENCOUNTER — Encounter: Payer: Self-pay | Admitting: Physical Therapy

## 2016-08-03 DIAGNOSIS — M25511 Pain in right shoulder: Secondary | ICD-10-CM

## 2016-08-03 DIAGNOSIS — M25611 Stiffness of right shoulder, not elsewhere classified: Secondary | ICD-10-CM | POA: Diagnosis not present

## 2016-08-03 DIAGNOSIS — G8929 Other chronic pain: Secondary | ICD-10-CM

## 2016-08-03 DIAGNOSIS — M6281 Muscle weakness (generalized): Secondary | ICD-10-CM | POA: Diagnosis not present

## 2016-08-03 NOTE — Patient Instructions (Signed)
   Cane Active Shoulder Flexion in supine  Grasp the cane about shoulder width. Bring it up over your head, trying to stretch the affected shoulder, holding for a 2 count and then bring the cane back to the resting position.      Supine Shoulder Abduction  Start lying on your back. Using the unaffected arm, raise the affected arm from your hip as high as your can up towards your head. Maintain a pain free range of motion.    Perform on each arm.       Perform each exercise 10 times, 2 sets, twice a day.

## 2016-08-03 NOTE — Therapy (Addendum)
[place], [place], [address] Phone: [phone]   Fax:  [phone]  Physical Therapy Evaluation  Patient Details  Name: Denise Wolfe MRN: [id number] Date of Birth: [birth year] Referring Provider: Aundria Mems, MD  Encounter Date: 08/03/2016      PT End of Session - 08/03/16 1349    Visit Number 1   Number of Visits 12   Date for PT Re-Evaluation 10/04/16   Authorization Type KX modifier at visit 15   PT Start Time 1335   PT Stop Time 1415   PT Time Calculation (min) 40 min   Activity Tolerance Patient tolerated treatment well   Behavior During Therapy [facility] for tasks assessed/performed      Past Medical History:  Diagnosis Date  . Anemia    during childhood  . Cancer [facility])    breast, age 73  . Depression     Past Surgical History:  Procedure Laterality Date  . ABDOMINAL HYSTERECTOMY     With bilateral oophorectomy. 2002.  Marland Kitchen BREAST SURGERY    . CATARACT EXTRACTION Bilateral   . MASTECTOMY MODIFIED RADICAL Left   . MENISCUS DEBRIDEMENT Right   . TUMOR EXCISION Right    thigh area    There were no vitals filed for this visit.       Subjective Assessment - 08/03/16 1342    Subjective pt reporting chronic R shoulder pain. Which began years ago. pt reporting over the last 6 months her pain is worsening and waking her up at night. Pt reporting pain 2-3/10 at rest. pt reporting pain is intermitten throughout the day.    How long can you sit comfortably? unlimited   How long can you stand comfortably? unlimited   How long can you walk comfortably? unlimited   Diagnostic tests X-ray, Dec 2017   Patient Stated Goals Stop hurting, be able sleep at night without waking up in pain   Currently in Pain? Yes   Pain Score 3    Pain Location Shoulder   Pain Orientation Right   Pain Descriptors / Indicators Aching  can be stabbing with certain movements   Pain Type Chronic pain   Pain Onset More than a month ago   Pain Frequency Intermittent   Aggravating Factors  sleeping, reaching overhead   Pain Relieving Factors resting, changing positions   Multiple Pain Sites No            [facility] PT Assessment - 08/03/16 0001      Assessment   Medical Diagnosis r shoulder pain   Referring Provider Aundria Mems, MD   Onset Date/Surgical Date --  worsening the last 6 months   Hand Dominance Right   Next MD Visit January 2018   Prior Therapy yes about 5 years ago     Precautions   Precautions None     Restrictions   Weight Bearing Restrictions No     Balance Screen   Has the patient fallen in the past 6 months No   Has the patient had a decrease in activity level because of a fear of falling?  No   Is the patient reluctant to leave their home because of a fear of falling?  No     Home Environment   Living Environment Private residence   Living Arrangements Alone   Available Help at Discharge Family   Type of [place] to enter   Entrance Stairs-Number of Steps 4  Entrance Stairs-Rails Left     Prior Function   Level of Independence Independent   Vocation Retired   Leisure wood working, she is an Scientist, forensic Status Within Functional Limits for tasks assessed     Observation/Other Assessments   Focus on Therapeutic Outcomes (FOTO)  43% limitation     Functional Tests   Functional tests Sit to Stand     Sit to Stand   Comments pt able to perform sit to stand without UE support from a standard chair.      Posture/Postural Control   Posture/Postural Control Postural limitations   Postural Limitations Rounded Shoulders;Forward head     ROM / Strength   AROM / PROM / Strength AROM;Strength     AROM   AROM Assessment Site Shoulder   Right/Left Shoulder Right   Right Shoulder Extension 50 Degrees   Right Shoulder Flexion 130 Degrees   Right Shoulder ABduction 82 Degrees   Right  Shoulder Internal Rotation 70 Degrees  L1-2 reaching up the back with thumb   Right Shoulder External Rotation 60 Degrees  mild pain at end range     Strength   Strength Assessment Site Shoulder   Right/Left Shoulder Right   Right Shoulder Flexion 3-/5   Right Shoulder Extension 5/5   Right Shoulder ABduction 4/5   Right Shoulder Internal Rotation 5/5   Right Shoulder External Rotation 5/5     Palpation   Palpation comment pt with tenderness noted to posterior deltoid and middle trap     Special Tests    Special Tests Rotator Cuff Impingement   Rotator Cuff Impingment tests Empty Can test;Full Can test     Empty Can test   Findings Negative   Side Right     Full Can test   Findings Positive   Side Right     Ambulation/Gait   Gait Comments Pt with decrease arm swing on R side.                            PT Education - 08/03/16 1348    Education provided Yes   Education Details HEP   Person(s) Educated Patient   Methods Explanation;Demonstration;Tactile cues;Verbal cues   Comprehension Verbalized understanding;Returned demonstration;Verbal cues required          PT Short Term Goals - 08/03/16 1350      PT SHORT TERM GOAL #1   Title Pt will be independ in her HEP.    Time 3   Period Weeks   Status New           PT Long Term Goals - 08/03/16 1352      PT LONG TERM GOAL #1   Title Pt will improve her FOTO score from 43% limitation to </= 33% limitation.    Time 6   Period Weeks   Status New     PT LONG TERM GOAL #2   Title Pt will improve her R shoulder flexion to >/= 150 degreees in order to improve function.    Time 6   Period Weeks   Status New     PT LONG TERM GOAL #3   Title pt will improve her R shoulder flexion and abduction strength to 5/5 in order to improve functional use.    Time 6   Period Weeks   Status New     PT LONG TERM GOAL #4  Title Pt will report being able to sleep throughout the night without waking up  due to pain.    Time 6   Period Weeks   Status New               Plan - 08/03/16 1438    Clinical Impression Statement Pt presents to therapy complaining of R shoulder pain which is chronic and has been worsening over the last 6 months. Pt with limited R shoulder flexion and abduction and IR. pt with weakness noted in shoulder flexion and abduction. pt also reporting inability to sleep and perform leisure activities or wood working. Skilled PT needed to address the impairments with the below interventions.    Rehab Potential Good   PT Frequency 2x / week   PT Duration 6 weeks   PT Treatment/Interventions ADLs/Self Care Home Management;Moist Heat;Therapeutic activities;Therapeutic exercise;Patient/family education;Passive range of motion;Manual techniques;Dry needling;Taping;Cryotherapy;Electrical Stimulation;Iontophoresis 4mg /ml Dexamethasone;Ultrasound;Functional mobility training   PT Next Visit Plan Review HEP, continue with R shoulder ROM and strengtheing exercises, modalities as appropriate   PT Home Exercise Plan shoulder flexion and abduction with cane   Consulted and Agree with Plan of Care Patient      Patient will benefit from skilled therapeutic intervention in order to improve the following deficits and impairments:  Decreased range of motion, Impaired UE functional use, Pain, Impaired perceived functional ability, Decreased mobility, Decreased strength  Visit Diagnosis: Chronic right shoulder pain - Plan: PT plan of care cert/re-cert  Stiffness of right shoulder, not elsewhere classified - Plan: PT plan of care cert/re-cert  Muscle weakness (generalized) - Plan: PT plan of care cert/re-cert     Problem List Patient Active Problem List   Diagnosis Date Noted  . Vitamin D insufficiency 07/19/2016  . HLD (hyperlipidemia) 07/19/2016  . History of retrosternal chest pain 07/19/2016  . Degenerative joint disease (DJD) of sternoclavicular joint, right 07/19/2016  .  History of breast Cancer 05/10/2016  . Depression 05/10/2016  . Cataract 05/10/2016  . Pain in joint, shoulder region 05/10/2016  . HTN (hypertension), benign 05/10/2016    Oretha Caprice , MPT 08/03/2016, 2:43 PM  [facility] [place], [place], [address] Phone: [phone]   Fax:  [phone]  Name: Adar Ferdon MRN: [id number] Date of Birth: [birth year]

## 2016-08-08 ENCOUNTER — Ambulatory Visit (INDEPENDENT_AMBULATORY_CARE_PROVIDER_SITE_OTHER): Payer: Medicare Other | Admitting: Family Medicine

## 2016-08-08 ENCOUNTER — Encounter: Payer: Self-pay | Admitting: Family Medicine

## 2016-08-08 VITALS — BP 153/84 | HR 85 | Ht 62.0 in | Wt 132.5 lb

## 2016-08-08 DIAGNOSIS — F341 Dysthymic disorder: Secondary | ICD-10-CM

## 2016-08-08 DIAGNOSIS — R079 Chest pain, unspecified: Secondary | ICD-10-CM | POA: Diagnosis not present

## 2016-08-08 DIAGNOSIS — I10 Essential (primary) hypertension: Secondary | ICD-10-CM

## 2016-08-08 DIAGNOSIS — C801 Malignant (primary) neoplasm, unspecified: Secondary | ICD-10-CM | POA: Diagnosis not present

## 2016-08-08 DIAGNOSIS — Z87898 Personal history of other specified conditions: Secondary | ICD-10-CM | POA: Diagnosis not present

## 2016-08-08 DIAGNOSIS — F430 Acute stress reaction: Secondary | ICD-10-CM | POA: Diagnosis not present

## 2016-08-08 DIAGNOSIS — E7800 Pure hypercholesterolemia, unspecified: Secondary | ICD-10-CM | POA: Diagnosis not present

## 2016-08-08 DIAGNOSIS — E782 Mixed hyperlipidemia: Secondary | ICD-10-CM | POA: Diagnosis not present

## 2016-08-08 DIAGNOSIS — Z23 Encounter for immunization: Secondary | ICD-10-CM

## 2016-08-08 MED ORDER — AMLODIPINE BESYLATE 5 MG PO TABS: 5.0000 mg | ORAL_TABLET | Freq: Every day | ORAL | 3 refills | Status: DC

## 2016-08-08 MED ORDER — SERTRALINE HCL 50 MG PO TABS: 50.0000 mg | ORAL_TABLET | Freq: Every day | ORAL | 3 refills | Status: DC

## 2016-08-08 MED ORDER — ATORVASTATIN CALCIUM 80 MG PO TABS: 40.0000 mg | ORAL_TABLET | Freq: Every day | ORAL | 3 refills | Status: DC

## 2016-08-08 MED ORDER — LOSARTAN POTASSIUM-HCTZ 50-12.5 MG PO TABS: ORAL_TABLET | ORAL | 3 refills | Status: DC

## 2016-08-08 NOTE — Patient Instructions (Addendum)
We added on a second blood pressure medicine, amlodipine at 5 mg daily. Please continue to monitor your blood pressure at home and please write it down. Bring in that log next office visit.    You know your goal blood pressure should be regularly under 140-150/ 90.   Come in sooner than planned if your blood pressure is not at goal after 2-3 weeks of taking the new medicine in addition to your existing ones.  4 your cholesterol, we will continue at the same dose that you take nightly, but we will need to check your liver function as well as other labs around January 24. Please make a separate appointment for this lab only visit.  Also for your nonspecific chest pain we will send you for a myocardial perfusion study. This is in exercise based study to strain your heart to make sure there is nothing wrong or showing abnormalities of the muscle of the heart.  Will also please try to go up on your Zoloft from 50 to 75 mg if you would like-if after 6-8 weeks of one dose you still don't feel your best,  please go to a counselor if you will agree to that  Please try to walk 30 minutes daily if possible.  Please take note of that handout I gave you of causes that are free for seniors at [place]. Please call to inquire about them   Guidelines for a Low Sodium Diet   Low Sodium Diet A main source of sodium is table salt. The average American eats five or more teaspoons of salt each day. This is about 20 times as much as the body needs. In fact, your body needs only 1/4 teaspoon of salt every day. Sodium is found naturally in foods, but a lot of it is added during processing and preparation. Many foods that do not taste salty may still be high in sodium. Large amounts of sodium can be hidden in canned, processed and convenience foods. And sodium can be found in many foods that are served at [employer].  Sodium controls fluid balance in our bodies and maintains blood volume and blood pressure.  Eating too much sodium may raise blood pressure and cause fluid retention, which could lead to swelling of the legs and feet or other health issues.  When limiting sodium in your diet, a common target is to eat less than 2,000 milligrams of sodium per day.   General Guidelines for Cutting Down on Salt Eliminate salty foods from your diet and reduce the amount of salt used in cooking. Sea salt is no better than regular salt.  Choose low sodium foods. Many salt-free or reduced salt products are available. When reading food labels, low sodium is defined as 140 mg of sodium per serving.  Salt substitutes are sometimes made from potassium, so read the label. If you are on a low potassium diet, then check with your doctor before using those salt substitutes.  Be creative and season your foods with spices, herbs, lemon, garlic, ginger, vinegar and pepper. Remove the salt shaker from the table.  Read ingredient labels to identify foods high in sodium. Items with 400 mg or more of sodium are high in sodium. High sodium food additives include salt, brine, or other items that say sodium, such as monosodium glutamate.  Eat more home-cooked meals. Foods cooked from scratch are naturally lower in sodium than most instant and boxed mixes.  Don't use softened water for cooking and drinking  since it contains added salt.  Avoid medications which contain sodium such as [employer].  For more information; food composition books are available which tell how much sodium is in food. Online sources such as www.calorieking.com also list amounts.     Meats, Poultry, Fish, Legumes, Eggs and Nuts  High-Sodium Foods: Smoked, cured, salted or canned meat, fish or poultry including bacon, cold cuts, ham, frankfurters, sausage, sardines, caviar and anchovies Frozen breaded meats and dinners, such as burritos and pizza Canned entrees, such as ravioli, spam and chili Salted nuts Beans canned with  salt added  Low-Sodium Alternatives: Any fresh or frozen beef, lamb, pork, poultry and fish Eggs and egg substitutes Low-sodium peanut butter Dry peas and beans (not canned) Low-sodium canned fish Drained, water or oil packed canned fish or poultry   Dairy Products  High-Sodium Foods: Buttermilk Regular and processed cheese, cheese spreads and sauces Cottage cheese  Low-Sodium Alternatives: Milk, yogurt, ice cream and ice milk Low-sodium cheeses, cream cheese, ricotta cheese and mozzarella   Breads, Grains and Cereals  High-Sodium Foods: Bread and rolls with salted tops Quick breads, self-rising flour, biscuit, pancake and waffle mixes Pizza, croutons and salted crackers Prepackaged, processed mixes for potatoes, rice, pasta and stuffing  Low-Sodium Alternatives: Breads, bagels and rolls without salted tops Muffins and most ready-to-eat cereals All rice and pasta, but do not to add salt when cooking Low-sodium corn and flour tortillas and noodles Low-sodium crackers and breadsticks Unsalted popcorn, chips and pretzels      Vegetables and Fruits  High-Sodium Foods: Regular canned vegetables and vegetable juices Olives, pickles, sauerkraut and other pickled vegetables Vegetables made with ham, bacon or salted pork Packaged mixes, such as scalloped or au gratin potatoes, frozen hash browns and Tater Tots Commercially prepared pasta and tomato sauces and salsa  Low-Sodium Alternatives: Fresh and frozen vegetables without sauces Low-sodium canned vegetables, sauces and juices Fresh potatoes, frozen [place] fries and instant mashed potatoes Low-salt tomato or V-8 juice. Most fresh, frozen and canned fruit Dried fruits   Soups  High-Sodium Foods: Regular canned and dehydrated soup, broth and bouillon Cup of noodles and seasoned ramen mixes  Low-Sodium Alternatives: Low-sodium canned and dehydrated soups, broth and bouillon Homemade soups without added  salt   Fats, Desserts and Sweets  High-Sodium Foods: Soy sauce, seasoning salt, other sauces and marinades Bottled salad dressings, regular salad dressing with bacon bits Salted butter or margarine Instant pudding and cake Large portions of ketchup, mustard  Low-Sodium Alternatives: Vinegar, unsalted butter or margarine Vegetable oils and low sodium sauces and salad dressings Mayonnaise All desserts made without salt

## 2016-08-08 NOTE — Progress Notes (Signed)
Impression and Recommendations:    1. History of retrosternal chest pain   2. Nonspecific chest pain   3. Mixed disorder as reaction to stress   4. Mixed hyperlipidemia   5. HTN (hypertension), benign   6. History of breast Cancer   7. Need for prophylactic vaccination and inoculation against influenza   8. Dysthymia   9. Pure hypercholesterolemia     We added on a second blood pressure medicine, amlodipine at 5 mg daily. Please continue to monitor your blood pressure at home and please write it down. Bring in that log next office visit.  4 your cholesterol, we will continue at the same dose that you take nightly, but we will need to check your liver function as well as other labs around January 24. Please make a separate appointment for this lab only visit.  Also for your nonspecific chest pain we will send you for a myocardial perfusion study. This is in exercise based study to strain your heart to make sure there is nothing wrong or showing abnormalities of the muscle of the heart.  Will also please try to go up on your Zoloft from 50 to 75 mg if you would like-if after 6-8 weeks of one dose you still don't feel your best,  please go to a counselor if you will agree to that  Please try to walk 30 minutes daily if possible.  Please take note of that handout I gave you of causes that are free for seniors at [facility]. Please call to inquire about them    Education and routine counseling performed. Handouts provided.  Orders Placed This Encounter  Procedures  . Basic metabolic panel  . Calcium  . ALT  . Myocardial Perfusion Imaging     New Prescriptions   AMLODIPINE (NORVASC) 5 MG TABLET    Take 1 tablet (5 mg total) by mouth daily.   MULTIPLE VITAMINS-MINERALS (MULTIVITAMIN WOMEN 50+) TABS    Take 1 tablet by mouth daily.   VITAMIN D, ERGOCALCIFEROL, (DRISDOL) 50000 UNITS CAPS CAPSULE    Take 1 capsule (50,000 Units total) by mouth every 7 (seven) days.    Modified  Medications   Modified Medication Previous Medication   ATORVASTATIN (LIPITOR) 80 MG TABLET atorvastatin (LIPITOR) 80 MG tablet      Take 0.5 tablets (40 mg total) by mouth daily at 6 PM.    Take 0.5 tablets (40 mg total) by mouth daily at 6 PM.   LOSARTAN-HYDROCHLOROTHIAZIDE (HYZAAR) 50-12.5 MG TABLET losartan-hydrochlorothiazide (HYZAAR) 50-12.5 MG tablet      Start with one half tablet daily for 1 week, then 1 full tablet daily    Start with one half tablet daily for 1 week, then 1 full tablet daily   SERTRALINE (ZOLOFT) 50 MG TABLET sertraline (ZOLOFT) 50 MG tablet      Take 1 tablet (50 mg total) by mouth daily.    Take 1 tablet (50 mg total) by mouth daily.    Discontinued Medications   No medications on file     Orders Placed This Encounter  Procedures  . Basic metabolic panel  . Calcium  . ALT  . Myocardial Perfusion Imaging     Return in about 4 months (around 12/07/2016) for come bldwrk around 09/05/16 as well.  The patient was counseled, risk factors were discussed, anticipatory guidance given.  Gross side effects, risk and benefits, and alternatives of medications discussed with patient.  Patient is aware that all medications  have potential side effects and we are unable to predict every side effect or drug-drug interaction that may occur.  Expresses verbal understanding and consents to current therapy plan and treatment regimen.  Please see AVS handed out to patient at the end of our visit for further patient instructions/ counseling done pertaining to today's office visit.    Note: This document was prepared using Dragon voice recognition software and may include unintentional dictation errors.     Subjective:    Chief Complaint  Patient presents with  . Hypertension    HPI: Denise Wolfe is a 75 y.o. female who presents to [place] [facility] today for follow up for  from last office visit we restarted her Hyzaar, atorvastatin and also  increased her Zoloft from 25-50.    CHol:  tol meds well of the 40mg    HTN: Home BP readings have been running in the - only took it a couple times 151/91 one time.    Pt has been tolerating meds well.   Taking as prescribed.  Denies HA, dizziness, CP- just the fullness/ discomfort, NO SOB, Visual changes, increasing pedal edema.   Exercise: no- not lately;  Diet Pattern: good;  Salt Restriction: no   Seen by Dr T recently of [facility] on 07/24/16--> she was pleased and has about 60% dec in pain and no longer waking up in pain in middle of the night.  Retrosternal pressure a feeling of fullness, -it's very vague but it is there.   started back in Oct.  NOT exercise induced.  Mostly occurs when sitting or standing.  Occurs almost everyday.  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  SHe has been under a lot more stress lately since losing husband in 4/16-  Then she lost her house, had to move etc..Marland KitchenMarland KitchenHas caused a lot of stress in her life lately.   Has bad burping spells more than usual- around the fall, aropund when her CP's came on.  Was NOT related to eating, or different foods. Did not occur with laying down etc.    Patient Care Team    Relationship Specialty Notifications Start End  [name], DO PCP - General Family Medicine  05/10/16   Silverio Decamp, MD Consulting Physician Family Medicine  07/25/16    Comment: OA - shoulder injeciton     Wt Readings from Last 3 Encounters:  08/27/16 132 lb (59.9 kg)  08/21/16 132 lb 14.4 oz (60.3 kg)  08/08/16 132 lb 8 oz (60.1 kg)    BP Readings from Last 3 Encounters:  08/21/16 119/66  08/08/16 (!) 153/84  07/24/16 (!) 167/79    Pulse Readings from Last 3 Encounters:  08/21/16 96  08/08/16 85  07/24/16 73    BMI Readings from Last 3 Encounters:  08/27/16 24.14 kg/m  08/21/16 24.31 kg/m  08/08/16 24.23 kg/m     Lab Results  Component Value Date   CREATININE 0.76  07/11/2016   BUN 20 07/11/2016   NA 140 07/11/2016   K 4.4 07/11/2016   CL 105 07/11/2016   CO2 26 07/11/2016    Lab Results  Component Value Date   CHOL 272 (H) 07/11/2016    Lab Results  Component Value Date   HDL 95 07/11/2016    Lab Results  Component Value Date   LDLCALC 167 (H) 07/11/2016    Lab Results  Component Value Date  TRIG 51 07/11/2016    Lab Results  Component Value Date   CHOLHDL 2.9 07/11/2016    No results found for: LDLDIRECT ===================================================================  Patient Active Problem List   Diagnosis Date Noted  . HLD (hyperlipidemia) 07/19/2016    Priority: High  . Elevated HDL:  >er 90 07/19/2016    Priority: High  . History of breast Cancer 05/10/2016    Priority: High  . HTN (hypertension), benign 05/10/2016    Priority: High  . 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 Depression 05/10/2016    Priority: Medium  . Vitamin D insufficiency 07/19/2016    Priority: Low  . Degenerative joint disease (DJD) of sternoclavicular joint, right 07/19/2016    Priority: Low  . Pain in joint, shoulder region 05/10/2016    Priority: Low  . Nonspecific chest pain 08/08/2016  . Retrosternal chest pain 07/19/2016  . Cataract 05/10/2016    Past Medical History:  Diagnosis Date  . Anemia    during childhood  . Cancer [facility])    breast, age 84  . Depression     Past Surgical History:  Procedure Laterality Date  . ABDOMINAL HYSTERECTOMY     With bilateral oophorectomy. 2002.  Marland Kitchen BREAST SURGERY    . CATARACT EXTRACTION Bilateral   . MASTECTOMY MODIFIED RADICAL Left   . MENISCUS DEBRIDEMENT Right   . TUMOR EXCISION Right    thigh area    Family History  Problem Relation Age of Onset  . Cancer Mother 100    breast  . Heart disease Father     History  Drug Use No  ,  History  Alcohol Use  . 8.4 oz/week  . 14 Glasses of wine per week  ,  History  Smoking Status  .  Never Smoker  Smokeless Tobacco  . Never Used  ,    Current Outpatient Prescriptions on File Prior to Visit  Medication Sig Dispense Refill  . amphetamine-dextroamphetamine (ADDERALL) 10 MG tablet Take 1 tablet (10 mg total) by mouth 2 (two) times daily. 60 tablet 0  . Multiple Vitamins-Minerals (MULTIVITAMIN WOMEN 50+) TABS Take 1 tablet by mouth daily.    . Vitamin D, Ergocalciferol, (DRISDOL) 50000 units CAPS capsule Take 1 capsule (50,000 Units total) by mouth every 7 (seven) days. 12 capsule 10   No current facility-administered medications on file prior to visit.     Allergies  Allergen Reactions  . Ibuprofen Rash    Review of Systems  Constitutional: Negative for diaphoresis and weight loss.  HENT: Negative for nosebleeds.   Eyes: Negative for blurred vision and double vision.  Respiratory: Negative for shortness of breath and wheezing.   Cardiovascular: Positive for chest pain. Negative for palpitations, orthopnea and claudication.       "discomfort" in chest x 2 months intermittently  Gastrointestinal: Negative for diarrhea, nausea and vomiting.  Musculoskeletal: Negative for falls and myalgias.  Skin: Negative for rash.  Neurological: Negative for dizziness, focal weakness and headaches.  Endo/Heme/Allergies: Negative for polydipsia.  Psychiatric/Behavioral: Negative for memory loss.    Objective:   Blood pressure (!) 153/84, pulse 85, height 5\' 2"  (1.575 m), weight 132 lb 8 oz (60.1 kg). Body mass index is 24.23 kg/m. General: Well Developed, well nourished, and in no acute distress.  HEENT: Normocephalic, atraumatic, pupils equal round reactive to light, neck supple, No carotid bruits, no JVD Skin: Warm and dry, cap RF less 2 sec Cardiac: Regular rate and rhythm,  S1, S2 WNL's, no murmurs rubs or gallops Respiratory: ECTA B/L, Not using accessory muscles, speaking in full sentences. NeuroM-Sk: Ambulates w/o assistance, moves ext * 4 w/o difficulty, sensation  grossly intact.  Ext: scant edema b/l lower ext Psych: No HI/SI, judgement and insight good, Euthymic mood. Full Affect.

## 2016-08-15 ENCOUNTER — Ambulatory Visit (INDEPENDENT_AMBULATORY_CARE_PROVIDER_SITE_OTHER): Payer: Medicare Other | Admitting: Rehabilitative and Restorative Service Providers"

## 2016-08-15 ENCOUNTER — Encounter (INDEPENDENT_AMBULATORY_CARE_PROVIDER_SITE_OTHER): Payer: Self-pay

## 2016-08-15 ENCOUNTER — Encounter: Payer: Self-pay | Admitting: Rehabilitative and Restorative Service Providers"

## 2016-08-15 DIAGNOSIS — M25611 Stiffness of right shoulder, not elsewhere classified: Secondary | ICD-10-CM | POA: Diagnosis not present

## 2016-08-15 DIAGNOSIS — M25511 Pain in right shoulder: Secondary | ICD-10-CM

## 2016-08-15 DIAGNOSIS — G8929 Other chronic pain: Secondary | ICD-10-CM

## 2016-08-15 DIAGNOSIS — M6281 Muscle weakness (generalized): Secondary | ICD-10-CM

## 2016-08-15 NOTE — Patient Instructions (Signed)
Shoulder Blade Squeeze    Rotate shoulders back, then squeeze shoulder blades down and back. Hold 10 sec Repeat _10___ times. Do _several___ sessions per day. Can use swim noodle for tactile cue  Scapula Adduction With Pectoralis Stretch: Low - Standing   Shoulders at 45 hands even with shoulders, keeping weight through legs, shift weight forward until you feel pull or stretch through the front of your chest. Hold _30__ seconds. Do _3__ times, _2-4__ times per day.   Scapula Adduction With Pectoralis Stretch: Mid-Range - Standing   Shoulders at 90 elbows even with shoulders, keeping weight through legs, shift weight forward until you feel pull or strength through the front of your chest. Hold __30_ seconds. Do _3__ times, __2-4_ times per day.   Scapula Adduction With Pectoralis Stretch: High - Standing   Shoulders at 120 hands up high on the doorway, keeping weight on feet, shift weight forward until you feel pull or stretch through the front of your chest. Hold _30__ seconds. Do _3__ times, _2-3__ times per day.

## 2016-08-15 NOTE — Therapy (Signed)
[place], [place], [address] Phone: [phone]   Fax:  [phone]  Physical Therapy Treatment  Patient Details  Name: Denise Wolfe MRN: [id number] Date of Birth: 21-Jan-1941 Referring Provider: Aundria Mems, MD  Encounter Date: 08/15/2016      PT End of Session - 08/15/16 1407    Visit Number 2   Number of Visits 12   Date for PT Re-Evaluation 10/04/16   PT Start Time [id number]   PT Stop Time 1451   PT Time Calculation (min) 48 min   Activity Tolerance Patient tolerated treatment well      Past Medical History:  Diagnosis Date  . Anemia    during childhood  . Cancer [facility])    breast, age 56  . Depression     Past Surgical History:  Procedure Laterality Date  . ABDOMINAL HYSTERECTOMY     With bilateral oophorectomy. 2002.  Marland Kitchen BREAST SURGERY    . CATARACT EXTRACTION Bilateral   . MASTECTOMY MODIFIED RADICAL Left   . MENISCUS DEBRIDEMENT Right   . TUMOR EXCISION Right    thigh area    There were no vitals filed for this visit.      Subjective Assessment - 08/15/16 1407    Subjective Patient reports that her shoulder is feeling better following the injection. She is sleeping better. Doing exercises some at home.    Currently in Pain? Yes   Pain Score 3    Pain Location Shoulder   Pain Orientation Right   Pain Descriptors / Indicators Aching;Dull   Pain Onset More than a month ago   Pain Frequency Intermittent                         [facility] Adult PT Treatment/Exercise - 08/15/16 0001      Therapeutic Activites    Therapeutic Activities --  myofacial ball release work Rt shd girdle      Shoulder Exercises: Pulleys   Flexion --  10 sec x 10   ABduction --  10 sec x 10      Shoulder Exercises: Stretch   Other Shoulder Stretches 3 way doorway stretch 30 sec x 2 each position    Other Shoulder Stretches supine on noodle and flat arms resting at side x  1-2 min each position arms ~ 60-80 deg      Moist Heat Therapy   Number Minutes Moist Heat 15 Minutes   Moist Heat Location Shoulder  Rt     Manual Therapy   Manual therapy comments pt supine    Soft tissue mobilization Rt pecs/anterior shoulder    Passive ROM Rt shoulder flexion/abd/ER/IR                PT Education - 08/15/16 1423    Education provided Yes   Education Details HEP   Person(s) Educated Patient   Methods Explanation;Demonstration;Tactile cues;Verbal cues;Handout   Comprehension Verbalized understanding;Returned demonstration;Verbal cues required;Tactile cues required          PT Short Term Goals - 08/15/16 1413      PT SHORT TERM GOAL #1   Title Pt will be independ in her HEP.    Time 3   Period Weeks   Status On-going           PT Long Term Goals - 08/15/16 1406      PT LONG TERM GOAL #1   Title Pt will improve her  FOTO score from 43% limitation to </= 33% limitation.    Time 6   Period Weeks   Status On-going     PT LONG TERM GOAL #2   Title Pt will improve her R shoulder flexion to >/= 150 degreees in order to improve function.    Time 6   Period Weeks   Status On-going     PT LONG TERM GOAL #3   Title pt will improve her R shoulder flexion and abduction strength to 5/5 in order to improve functional use.    Time 6   Period Weeks   Status On-going     PT LONG TERM GOAL #4   Title Pt will report being able to sleep throughout the night without waking up due to pain.    Time 6   Period Weeks   Status On-going               Plan - 08/15/16 1409    Clinical Impression Statement Improving Rt shoulder pain, now sleeping better and having less pain. She has been inconsistent with HEP. Progressing well with exercise in the clinic. Working toward stated goals of therapy.    PT Frequency 2x / week   PT Duration 6 weeks   PT Treatment/Interventions ADLs/Self Care Home Management;Moist Heat;Therapeutic activities;Therapeutic  exercise;Patient/family education;Passive range of motion;Manual techniques;Dry needling;Taping;Cryotherapy;Electrical Stimulation;Iontophoresis 4mg /ml Dexamethasone;Ultrasound;Functional mobility training   PT Next Visit Plan Review HEP, continue with R shoulder ROM and strengtheing exercises, modalities as appropriate   PT Home Exercise Plan HEP   Consulted and Agree with Plan of Care Patient      Patient will benefit from skilled therapeutic intervention in order to improve the following deficits and impairments:  Decreased range of motion, Impaired UE functional use, Pain, Impaired perceived functional ability, Decreased mobility, Decreased strength  Visit Diagnosis: Chronic right shoulder pain  Stiffness of right shoulder, not elsewhere classified  Muscle weakness (generalized)     Problem List Patient Active Problem List   Diagnosis Date Noted  . Nonspecific chest pain 08/08/2016  . 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 Vitamin D insufficiency 07/19/2016  . HLD (hyperlipidemia) 07/19/2016  . History of retrosternal chest pain 07/19/2016  . Degenerative joint disease (DJD) of sternoclavicular joint, right 07/19/2016  . History of breast Cancer 05/10/2016  . Depression 05/10/2016  . Cataract 05/10/2016  . Pain in joint, shoulder region 05/10/2016  . HTN (hypertension), benign 05/10/2016    Cody Albus Nilda Simmer PT, MPH  08/15/2016, 2:36 PM  [facility] [place], [place], [address] Phone: [phone]   Fax:  [phone]  Name: Denise Wolfe MRN: [id number] Date of Birth: 30-Sep-1940

## 2016-08-17 ENCOUNTER — Ambulatory Visit (INDEPENDENT_AMBULATORY_CARE_PROVIDER_SITE_OTHER): Payer: Medicare Other | Admitting: Physical Therapy

## 2016-08-17 DIAGNOSIS — G8929 Other chronic pain: Secondary | ICD-10-CM | POA: Diagnosis not present

## 2016-08-17 DIAGNOSIS — M25611 Stiffness of right shoulder, not elsewhere classified: Secondary | ICD-10-CM

## 2016-08-17 DIAGNOSIS — M25511 Pain in right shoulder: Secondary | ICD-10-CM | POA: Diagnosis not present

## 2016-08-17 DIAGNOSIS — M6281 Muscle weakness (generalized): Secondary | ICD-10-CM | POA: Diagnosis not present

## 2016-08-17 NOTE — Therapy (Signed)
[place], [place], [address] Phone: [phone]   Fax:  [phone]  Physical Therapy Treatment  Patient Details  Name: Denise Wolfe MRN: [id number] Date of Birth: [birth year] Referring Provider: Aundria Mems, MD  Encounter Date: 08/17/2016      PT End of Session - 08/17/16 1409    Visit Number 3   Number of Visits 12   Date for PT Re-Evaluation 10/04/16   Authorization Type KX modifier at visit 15   PT Start Time 1408  pt brought back late   PT Stop Time 1446   PT Time Calculation (min) 38 min   Activity Tolerance Patient tolerated treatment well;No increased pain   Behavior During Therapy WFL for tasks assessed/performed      Past Medical History:  Diagnosis Date  . Anemia    during childhood  . Cancer [facility])    breast, age 60  . Depression     Past Surgical History:  Procedure Laterality Date  . ABDOMINAL HYSTERECTOMY     With bilateral oophorectomy. 2002.  Marland Kitchen BREAST SURGERY    . CATARACT EXTRACTION Bilateral   . MASTECTOMY MODIFIED RADICAL Left   . MENISCUS DEBRIDEMENT Right   . TUMOR EXCISION Right    thigh area    There were no vitals filed for this visit.      Subjective Assessment - 08/17/16 1409    Subjective Pt reports her Rt shoulder feels about the same, maybe a little looser.     Currently in Pain? Yes   Pain Score 3    Pain Location Shoulder   Pain Orientation Right   Pain Descriptors / Indicators Aching   Aggravating Factors  sleeping, reaching overhead   Pain Relieving Factors resting, changing positions.             [facility] PT Assessment - 08/17/16 0001      Assessment   Medical Diagnosis Rt shoulder pain   Onset Date/Surgical Date --  worsening the last 6 months   Hand Dominance Right   Next MD Visit 08/21/16     AROM   Right Shoulder Extension 50 Degrees  standing   Right Shoulder Flexion 143 Degrees  standing.  155 deg in supine   Right Shoulder ABduction 142 Degrees  standing    Right Shoulder Internal Rotation 70 Degrees  L1-2 reaching up the back with thumb   Right Shoulder External Rotation 60 Degrees  supine, shoulder abdct 70 deg     Strength   Right Shoulder Flexion 4+/5   Right Shoulder ABduction --  5-/5                     [facility] Adult PT Treatment/Exercise - 08/17/16 0001      Shoulder Exercises: Supine   Other Supine Exercises on green pool noodle:  snow angels to ~80 deg x 10 reps.  shoulder flexion stretch x 5 sec x 10 reps      Shoulder Exercises: Pulleys   Flexion --  10 sec x 10   ABduction --  10 sec x 10      Shoulder Exercises: Stretch   Internal Rotation Stretch --  10 reps x 5 sec hold.    Other Shoulder Stretches 3 way doorway stretch 30 sec x 2 each position      Modalities   Modalities --  pt declined.      Manual Therapy   Soft tissue mobilization Rt pecs/anterior  shoulder, subscap, infraspinatus, lat.    Passive ROM Rt Shoulder Abdct, ER                   PT Short Term Goals - 08/15/16 1413      PT SHORT TERM GOAL #1   Title Pt will be independ in her HEP.    Time 3   Period Weeks   Status On-going           PT Long Term Goals - 08/15/16 1406      PT LONG TERM GOAL #1   Title Pt will improve her FOTO score from 43% limitation to </= 33% limitation.    Time 6   Period Weeks   Status On-going     PT LONG TERM GOAL #2   Title Pt will improve her R shoulder flexion to >/= 150 degreees in order to improve function.    Time 6   Period Weeks   Status On-going     PT LONG TERM GOAL #3   Title pt will improve her R shoulder flexion and abduction strength to 5/5 in order to improve functional use.    Time 6   Period Weeks   Status On-going     PT LONG TERM GOAL #4   Title Pt will report being able to sleep throughout the night without waking up due to pain.    Time 6   Period Weeks   Status On-going             Patient  will benefit from skilled therapeutic intervention in order to improve the following deficits and impairments:     Visit Diagnosis: Chronic right shoulder pain  Stiffness of right shoulder, not elsewhere classified  Muscle weakness (generalized)     Problem List Patient Active Problem List   Diagnosis Date Noted  . Nonspecific chest pain 08/08/2016  . 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 Vitamin D insufficiency 07/19/2016  . HLD (hyperlipidemia) 07/19/2016  . History of retrosternal chest pain 07/19/2016  . Degenerative joint disease (DJD) of sternoclavicular joint, right 07/19/2016  . History of breast Cancer 05/10/2016  . Depression 05/10/2016  . Cataract 05/10/2016  . Pain in joint, shoulder region 05/10/2016  . HTN (hypertension), benign 05/10/2016   Kerin Perna, PTA 08/17/16 4:42 PM  [facility] [place], [place], [address] Phone: [phone]   Fax:  [phone]  Name: Denise Wolfe MRN: [id number] Date of Birth: 1941-05-09

## 2016-08-18 MED ORDER — VITAMIN D (ERGOCALCIFEROL) 1.25 MG (50000 UNIT) PO CAPS: 50000.0000 [IU] | ORAL_CAPSULE | ORAL | 10 refills | Status: DC

## 2016-08-18 MED ORDER — PROSIGHT PO TABS: 1.0000 | ORAL_TABLET | Freq: Every day | ORAL | Status: DC

## 2016-08-18 MED ORDER — MULTIVITAMIN WOMEN 50+ PO TABS: 1.0000 | ORAL_TABLET | Freq: Every day | ORAL | Status: DC

## 2016-08-18 NOTE — Assessment & Plan Note (Addendum)
Start meds- Hyzaar- 1/2 tab po qd--> slwly inc as tolerated to goal bp less than 140/90    Also, risks and benefits of medications discussed with patient, including alternative treatments.   Encouraged patient to read drug information handouts to further educate self about the medicine prior to starting it.   Lifestyle changes such as dash diet and engaging in a regular exercise program discussed with patient.  -  Educational handouts provided  Ambulatory BP monitoring encouraged. Keep log and bring in next OV

## 2016-08-18 NOTE — Assessment & Plan Note (Addendum)
Importance of keeping active and HDL up d/c pt.    Counseling done  All questions answered  Handouts given if patient desired them

## 2016-08-18 NOTE — Assessment & Plan Note (Addendum)
Importance of supplementation of at least 50 K IU vitamin D3 on a daily basis and 1200 of calcium daily discussed with patient.  Advised she come in in the near future for yearly physical in which we would discuss screening tests including bone density etc.     Stressed importance.

## 2016-08-18 NOTE — Assessment & Plan Note (Addendum)
Last OV- restarted pt on her meds.    Mood- now much more stable; more energy but would like to inc dose.  Changed from Zoloft 25 to 50MG  today.   -  Rec CBT, [employer]/ exercise and community socialization activities

## 2016-08-18 NOTE — Assessment & Plan Note (Addendum)
After d/c pt, she wishes to go to a Sports Med/ Ortho doc for further eval and txmnt.  Obtain x-ray to help expedite to px at sports med office.   - referral placed today to sprts med- [place]

## 2016-08-18 NOTE — Assessment & Plan Note (Addendum)
OTC medicines and ICE shoulder as often as 15-27min every hour d/c pt.  Declined PT today- says she will d/c sprts med/ Ortho

## 2016-08-20 ENCOUNTER — Encounter: Payer: Medicare Other | Admitting: Physical Therapy

## 2016-08-21 ENCOUNTER — Encounter: Payer: Self-pay | Admitting: Sports Medicine

## 2016-08-21 ENCOUNTER — Ambulatory Visit (INDEPENDENT_AMBULATORY_CARE_PROVIDER_SITE_OTHER): Payer: Medicare Other | Admitting: Sports Medicine

## 2016-08-21 ENCOUNTER — Ambulatory Visit (INDEPENDENT_AMBULATORY_CARE_PROVIDER_SITE_OTHER): Payer: Medicare Other | Admitting: Physical Therapy

## 2016-08-21 DIAGNOSIS — M25611 Stiffness of right shoulder, not elsewhere classified: Secondary | ICD-10-CM | POA: Diagnosis not present

## 2016-08-21 DIAGNOSIS — G8929 Other chronic pain: Secondary | ICD-10-CM | POA: Diagnosis not present

## 2016-08-21 DIAGNOSIS — M25511 Pain in right shoulder: Secondary | ICD-10-CM | POA: Diagnosis not present

## 2016-08-21 DIAGNOSIS — M6281 Muscle weakness (generalized): Secondary | ICD-10-CM

## 2016-08-21 NOTE — Progress Notes (Signed)
  Subjective:    CC: Follow-up  HPI: This is a pleasant 76 year old female, she had pain referable to both the glenohumeral and subacromial spaces at the last visit, we injected both the structures and she returns pain-free. She has been doing physical therapy aggressively. Overall happy with results.  Past medical history:  Negative.  See flowsheet/record as well for more information.  Surgical history: Negative.  See flowsheet/record as well for more information.  Family history: Negative.  See flowsheet/record as well for more information.  Social history: Negative.  See flowsheet/record as well for more information.  Allergies, and medications have been entered into the medical record, reviewed, and no changes needed.   Review of Systems: No fevers, chills, night sweats, weight loss, chest pain, or shortness of breath.   Objective:    General: Well Developed, well nourished, and in no acute distress.  Neuro: Alert and oriented x3, extra-ocular muscles intact, sensation grossly intact.  HEENT: Normocephalic, atraumatic, pupils equal round reactive to light, neck supple, no masses, no lymphadenopathy, thyroid nonpalpable.  Skin: Warm and dry, no rashes. Cardiac: Regular rate and rhythm, no murmurs rubs or gallops, no lower extremity edema.  Respiratory: Clear to auscultation bilaterally. Not using accessory muscles, speaking in full sentences.   Impression and Recommendations:    Pain in joint, shoulder region Denise Wolfe did very well with a glenohumeral and subacromial injection at the last visit, has worked hard in physical therapy and is rewarded with being pain-free. Return as needed. I have encouraged her to continue doing her exercises at least every other day on both sides.

## 2016-08-21 NOTE — Therapy (Signed)
[place], [place], [address] Phone: [phone]   Fax:  [phone]  Physical Therapy Treatment  Patient Details  Name: Denise Wolfe MRN: [id number] Date of Birth: 1941/02/07 Referring Provider: Aundria Mems, MD  Encounter Date: 08/21/2016      PT End of Session - 08/21/16 1227    Visit Number 4   Number of Visits 12   Date for PT Re-Evaluation 10/04/16   Authorization Type KX modifier at visit 15   PT Start Time 1148   PT Stop Time 1238  MHP last 12 min   PT Time Calculation (min) 50 min   Activity Tolerance Patient tolerated treatment well   Behavior During Therapy [facility] for tasks assessed/performed      Past Medical History:  Diagnosis Date  . Anemia    during childhood  . Cancer [facility])    breast, age 79  . Depression     Past Surgical History:  Procedure Laterality Date  . ABDOMINAL HYSTERECTOMY     With bilateral oophorectomy. 2002.  Marland Kitchen BREAST SURGERY    . CATARACT EXTRACTION Bilateral   . MASTECTOMY MODIFIED RADICAL Left   . MENISCUS DEBRIDEMENT Right   . TUMOR EXCISION Right    thigh area    There were no vitals filed for this visit.      Subjective Assessment - 08/21/16 1153    Subjective Pt reports her shoulder is no longer waking her in the night.     Patient Stated Goals Stop hurting, be able sleep at night without waking up in pain   Currently in Pain? No/denies   Pain Score 0-No pain   Pain Location Shoulder   Pain Orientation Right            [facility] PT Assessment - 08/21/16 0001      Assessment   Medical Diagnosis Rt shoulder pain   Hand Dominance Right   Next MD Visit 08/21/16     AROM   Right Shoulder Flexion 147 Degrees   Right Shoulder Internal Rotation --  L1-2 reaching up the back with thumb, painful   Right Shoulder External Rotation 60 Degrees  supine, shoulder abdct 70 deg     Strength   Right Shoulder Flexion 4+/5   Right Shoulder  ABduction --  5-/5          [facility] Adult PT Treatment/Exercise - 08/21/16 0001      Shoulder Exercises: Supine   Horizontal ABduction Strengthening;Both;15 reps;Theraband   Theraband Level (Shoulder Horizontal ABduction) Level 3 (Green)   External Rotation Strengthening   Theraband Level (Shoulder External Rotation) Level 2 (Red)  x 15 reps (BUE)   Flexion Strengthening;Both;15 reps;Theraband   Theraband Level (Shoulder Flexion) Level 2 (Red)   Other Supine Exercises hands behind head, pressing elbows down x 5 sec x 8 reps   Other Supine Exercises sash with green band x 15 reps RUE     Shoulder Exercises: Standing   Flexion Strengthening;Right;10 reps;Weights   Shoulder Flexion Weight (lbs) 2   Flexion Limitations 3, 4, 5# x 2 reps to overhead shelf.      Shoulder Exercises: Pulleys   Flexion --  10 sec x 10   ABduction --  10 sec x 10      Shoulder Exercises: Stretch   Internal Rotation Stretch --  10 reps x 5 sec hold.    External Rotation Stretch --  8 reps, holding 10 sec  Moist Heat Therapy   Number Minutes Moist Heat 12 Minutes   Moist Heat Location Shoulder                  PT Short Term Goals - 08/21/16 1234      PT SHORT TERM GOAL #1   Title Pt will be independent in her HEP.    Time 3   Period Weeks   Status On-going           PT Long Term Goals - 08/21/16 1154      PT LONG TERM GOAL #1   Title Pt will improve her FOTO score from 43% limitation to </= 33% limitation.    Time 6   Period Weeks   Status On-going     PT LONG TERM GOAL #2   Title Pt will improve her R shoulder flexion to >/= 150 degreees in order to improve function.    Time 6   Period Weeks   Status On-going  near meeting     PT [place] #3   Title pt will improve her R shoulder flexion and abduction strength to 5/5 in order to improve functional use.    Time 6   Period Weeks   Status On-going     PT LONG TERM GOAL #4   Title Pt will report being  able to sleep throughout the night without waking up due to pain.    Time 6   Period Weeks   Status Achieved               Plan - 08/21/16 1230    Clinical Impression Statement Pt's Rt shoulder ROM improving; close to meeting ROM goall. She is no longer having pain when sleeping; has met LTG #4.  She tolerated new strengthening exercises well, with min to no increase in pain.  Rt shoulder IR/ER stretches continue to be painful; pt encouraged to perform to tolerance. Progressing well towards established goals.    Rehab Potential Good   PT Frequency 2x / week   PT Duration 6 weeks   PT Treatment/Interventions ADLs/Self Care Home Management;Moist Heat;Therapeutic activities;Therapeutic exercise;Patient/family education;Passive range of motion;Manual techniques;Dry needling;Taping;Cryotherapy;Electrical Stimulation;Iontophoresis '4mg'$ /ml Dexamethasone;Ultrasound;Functional mobility training   PT Next Visit Plan Review HEP, continue with R shoulder ROM and strengtheing exercises, modalities as appropriate   Consulted and Agree with Plan of Care Patient      Patient will benefit from skilled therapeutic intervention in order to improve the following deficits and impairments:  Decreased range of motion, Impaired UE functional use, Pain, Impaired perceived functional ability, Decreased mobility, Decreased strength  Visit Diagnosis: Chronic right shoulder pain  Stiffness of right shoulder, not elsewhere classified  Muscle weakness (generalized)     Problem List Patient Active Problem List   Diagnosis Date Noted  . Nonspecific chest pain 08/08/2016  . 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 Vitamin D insufficiency 07/19/2016  . HLD (hyperlipidemia) 07/19/2016  . Retrosternal chest pain 07/19/2016  . Degenerative joint disease (DJD) of sternoclavicular joint, right 07/19/2016  . Elevated HDL:  >er 90 07/19/2016  . History of breast Cancer 05/10/2016  . Depression  05/10/2016  . Cataract 05/10/2016  . Pain in joint, shoulder region 05/10/2016  . HTN (hypertension), benign 05/10/2016   Kerin Perna, PTA 08/21/16 12:54 PM  [place] [address] [place], [place], [address] Phone: [phone]   Fax:  [phone]  Name: Denise Wolfe MRN: [id number] Date  of Birth: 06-Jun-1941

## 2016-08-21 NOTE — Assessment & Plan Note (Addendum)
Denise Wolfe did very well with a glenohumeral and subacromial injection at the last visit, has worked hard in physical therapy and is rewarded with being pain-free. Return as needed. I have encouraged her to continue doing her exercises at least every other day on both sides.

## 2016-08-21 NOTE — Patient Instructions (Signed)
Elbow Press    Interlace fingers; bring hands underneath head. Press elbows down. Hold _10__ seconds. Relax arms. Repeat _5__ times.   BAND EXERCISES: 2-3 sets of 10 repetitions, 3x/wk Over Head Pull: Narrow Grip        On back, knees bent, feet flat, band across thighs, elbows straight but relaxed. Pull hands apart (start). Keeping elbows straight, bring arms up and over head, hands toward floor. Keep pull steady on band. Hold momentarily. Return slowly, keeping pull steady, back to start. Repeat _10__ times. Band color _red_____ ,   Sash   On back, knees bent, feet flat, left hand on left hip, right hand above left. Pull right arm DIAGONALLY (hip to shoulder) across chest. Bring right arm along head toward floor. Hold momentarily. Slowly return to starting position. Repeat _10__ times. Do with left arm. Band color __green____   Shoulder Rotation: Double Arm   On back, knees bent, feet flat, elbows tucked at sides, bent 90, hands palms up. Pull hands apart and down toward floor, keeping elbows near sides. Hold momentarily. Slowly return to starting position. Repeat __10_ times. Band color _red_____    [facility] [place], [place] [address]  [phone] (office) [phone] (fax)

## 2016-08-23 ENCOUNTER — Telehealth (HOSPITAL_COMMUNITY): Payer: Self-pay

## 2016-08-23 ENCOUNTER — Telehealth (HOSPITAL_COMMUNITY): Payer: Self-pay | Admitting: *Deleted

## 2016-08-23 ENCOUNTER — Ambulatory Visit (INDEPENDENT_AMBULATORY_CARE_PROVIDER_SITE_OTHER): Payer: Medicare Other | Admitting: Physical Therapy

## 2016-08-23 DIAGNOSIS — G8929 Other chronic pain: Secondary | ICD-10-CM

## 2016-08-23 DIAGNOSIS — M25611 Stiffness of right shoulder, not elsewhere classified: Secondary | ICD-10-CM

## 2016-08-23 DIAGNOSIS — M6281 Muscle weakness (generalized): Secondary | ICD-10-CM

## 2016-08-23 DIAGNOSIS — M25511 Pain in right shoulder: Secondary | ICD-10-CM | POA: Diagnosis not present

## 2016-08-23 NOTE — Telephone Encounter (Signed)
Patient given detailed instructions per Myocardial Perfusion Study Information Sheet for the test on 08/27/16 at 1000. Patient notified to arrive 15 minutes early and that it is imperative to arrive on time for appointment to keep from having the test rescheduled.  If you need to cancel or reschedule your appointment, please call the office within 24 hours of your appointment. Failure to do so may result in a cancellation of your appointment, and a $50 no show fee. Patient verbalized understanding. Janifer Adie, CNMT, RT-N

## 2016-08-23 NOTE — Therapy (Addendum)
[place], [place], [address] Phone: [phone]   Fax:  [phone]  Physical Therapy Treatment  Patient Details  Name: Stewart Pimenta MRN: [id number] Date of Birth: [birth year] Referring Provider: Dr. Dianah Field  Encounter Date: 08/23/2016      PT End of Session - 08/23/16 1407    Visit Number 5   Number of Visits 12   Date for PT Re-Evaluation 10/04/16   PT Start Time 1402   PT Stop Time 1500  last 12 min MHP   PT Time Calculation (min) 58 min   Activity Tolerance Patient tolerated treatment well;No increased pain   Behavior During Therapy WFL for tasks assessed/performed      Past Medical History:  Diagnosis Date  . Anemia    during childhood  . Cancer [facility])    breast, age 82  . Depression     Past Surgical History:  Procedure Laterality Date  . ABDOMINAL HYSTERECTOMY     With bilateral oophorectomy. 2002.  Marland Kitchen BREAST SURGERY    . CATARACT EXTRACTION Bilateral   . MASTECTOMY MODIFIED RADICAL Left   . MENISCUS DEBRIDEMENT Right   . TUMOR EXCISION Right    thigh area    There were no vitals filed for this visit.      Subjective Assessment - 08/23/16 1407    Subjective Pt reports no new changes since last visit.    Currently in Pain? Yes   Pain Score 0-No pain   Pain Location Shoulder            [facility] PT Assessment - 08/23/16 0001      Assessment   Medical Diagnosis Rt shoulder pain   Referring Provider Dr. Dianah Field   Hand Dominance Right   Next MD Visit PRN     AROM   Right Shoulder Flexion 147 Degrees  supine            [facility] Adult PT Treatment/Exercise - 08/23/16 0001      Shoulder Exercises: Supine   Horizontal ABduction Strengthening;Both;15 reps;Theraband   Theraband Level (Shoulder Horizontal ABduction) Level 3 (Green)   External Rotation Strengthening   Theraband Level (Shoulder External Rotation) Level 2 (Red)  2 sets   Flexion  Strengthening;Both;15 reps;Theraband   Theraband Level (Shoulder Flexion) Level 3 (Green)   Other Supine Exercises hands behind head, pressing elbows down x 5 sec x 8 reps   Other Supine Exercises sash with green band x 15 reps RUE     Shoulder Exercises: Standing   Extension Both;10 reps;Theraband   Theraband Level (Shoulder Extension) Level 2 (Red)   Row Strengthening;Both;10 reps;Theraband   Theraband Level (Shoulder Row) Level 3 (Green)     Shoulder Exercises: ROM/Strengthening   UBE (Upper Arm Bike) L1: 1 min each direction      Shoulder Exercises: Stretch   Internal Rotation Stretch 20 seconds   Other Shoulder Stretches 3 way doorway stretch 30 sec x 2 each position      Moist Heat Therapy   Number Minutes Moist Heat 12 Minutes   Moist Heat Location Shoulder     Manual Therapy   Manual therapy comments pt supine    Soft tissue mobilization Rt pecs/anterior shoulder, subscap, infraspinatus, lat.                   PT Short Term Goals - 08/21/16 1234      PT SHORT TERM GOAL #1   Title Pt will be independent  in her HEP.    Time 3   Period Weeks   Status On-going           PT Long Term Goals - 08/21/16 1154      PT LONG TERM GOAL #1   Title Pt will improve her FOTO score from 43% limitation to </= 33% limitation.    Time 6   Period Weeks   Status On-going     PT LONG TERM GOAL #2   Title Pt will improve her R shoulder flexion to >/= 150 degreees in order to improve function.    Time 6   Period Weeks   Status On-going  near meeting     PT [place] #3   Title pt will improve her R shoulder flexion and abduction strength to 5/5 in order to improve functional use.    Time 6   Period Weeks   Status On-going     PT LONG TERM GOAL #4   Title Pt will report being able to sleep throughout the night without waking up due to pain.    Time 6   Period Weeks   Status Achieved               Plan - 08/23/16 1451    Clinical Impression  Statement Pt tolerated exercises well, without increase in symptoms. She had some tightness in Rt pec and tenderness in infraspinatus with manual therapy.  Progressing well towards goals.    Rehab Potential Good   PT Frequency 2x / week   PT Duration 6 weeks   PT Next Visit Plan continue with Rt shoulder ROM and strengtheing exercises, modalities as appropriate   Consulted and Agree with Plan of Care Patient      Patient will benefit from skilled therapeutic intervention in order to improve the following deficits and impairments:  Decreased range of motion, Impaired UE functional use, Pain, Impaired perceived functional ability, Decreased mobility, Decreased strength  Visit Diagnosis: Chronic right shoulder pain  Stiffness of right shoulder, not elsewhere classified  Muscle weakness (generalized)     Problem List Patient Active Problem List   Diagnosis Date Noted  . Nonspecific chest pain 08/08/2016  . 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 Vitamin D insufficiency 07/19/2016  . HLD (hyperlipidemia) 07/19/2016  . Retrosternal chest pain 07/19/2016  . Degenerative joint disease (DJD) of sternoclavicular joint, right 07/19/2016  . Elevated HDL:  >er 90 07/19/2016  . History of breast Cancer 05/10/2016  . Depression 05/10/2016  . Cataract 05/10/2016  . Pain in joint, shoulder region 05/10/2016  . HTN (hypertension), benign 05/10/2016   Kerin Perna, PTA 08/23/16 3:03 PM   [place] [address] [place], [place], [address] Phone: [phone]   Fax:  [phone]  Name: Lorina Duffner MRN: [id number] Date of Birth: 07-07-1941  PHYSICAL THERAPY DISCHARGE SUMMARY  Visits from Start of Care: 5  Current functional level related to goals / functional outcomes: See last progress note for discharge status   Remaining deficits: See note   Education / Equipment: HEP Plan: Patient agrees to  discharge.  Patient goals were not met. Patient is being discharged due to not returning since the last visit.  ?????    Celyn P. Helene Kelp PT, MPH 09/21/16 10:01 AM

## 2016-08-23 NOTE — Telephone Encounter (Signed)
Left message on voicemail in reference to upcoming appointment scheduled for 08/27/16. Phone number given for a call back so details instructions can be given. Kirstie Peri

## 2016-08-27 ENCOUNTER — Ambulatory Visit (HOSPITAL_COMMUNITY): Payer: Medicare Other | Attending: Cardiovascular Disease

## 2016-08-27 ENCOUNTER — Encounter: Payer: Self-pay | Admitting: Rehabilitative and Restorative Service Providers"

## 2016-08-27 DIAGNOSIS — I10 Essential (primary) hypertension: Secondary | ICD-10-CM

## 2016-08-27 DIAGNOSIS — E782 Mixed hyperlipidemia: Secondary | ICD-10-CM | POA: Diagnosis not present

## 2016-08-27 DIAGNOSIS — C801 Malignant (primary) neoplasm, unspecified: Secondary | ICD-10-CM | POA: Diagnosis not present

## 2016-08-27 DIAGNOSIS — F430 Acute stress reaction: Secondary | ICD-10-CM | POA: Diagnosis not present

## 2016-08-27 DIAGNOSIS — Z87898 Personal history of other specified conditions: Secondary | ICD-10-CM

## 2016-08-27 DIAGNOSIS — R079 Chest pain, unspecified: Secondary | ICD-10-CM

## 2016-08-27 LAB — MYOCARDIAL PERFUSION IMAGING
CHL CUP MPHR: 147 {beats}/min
CHL CUP NUCLEAR SSS: 5
CSEPEDS: 0 s
Estimated workload: 7 METS
Exercise duration (min): 5 min
LVDIAVOL: 63 mL (ref 46–106)
LVSYSVOL: 22 mL
NUC STRESS TID: 0.97
Peak HR: 153 {beats}/min
Percent HR: 104 %
RATE: 0.27
Rest HR: 82 {beats}/min
SDS: 2
SRS: 3

## 2016-08-27 MED ORDER — TECHNETIUM TC 99M TETROFOSMIN IV KIT
10.1000 | PACK | Freq: Once | INTRAVENOUS | Status: AC | PRN
  Administered 2016-08-27: 10.1 via INTRAVENOUS
  Filled 2016-08-27: qty 11

## 2016-08-27 MED ORDER — TECHNETIUM TC 99M TETROFOSMIN IV KIT
31.7000 | PACK | Freq: Once | INTRAVENOUS | Status: AC | PRN
  Administered 2016-08-27: 31.7 via INTRAVENOUS
  Filled 2016-08-27: qty 32

## 2016-08-30 ENCOUNTER — Encounter: Payer: Medicare Other | Admitting: Physical Therapy

## 2016-09-05 ENCOUNTER — Other Ambulatory Visit (INDEPENDENT_AMBULATORY_CARE_PROVIDER_SITE_OTHER): Payer: Medicare Other

## 2016-09-05 DIAGNOSIS — I10 Essential (primary) hypertension: Secondary | ICD-10-CM

## 2016-09-05 DIAGNOSIS — R079 Chest pain, unspecified: Secondary | ICD-10-CM

## 2016-09-05 DIAGNOSIS — F341 Dysthymic disorder: Secondary | ICD-10-CM

## 2016-09-05 DIAGNOSIS — E782 Mixed hyperlipidemia: Secondary | ICD-10-CM

## 2016-09-05 DIAGNOSIS — E7800 Pure hypercholesterolemia, unspecified: Secondary | ICD-10-CM

## 2016-09-08 LAB — BASIC METABOLIC PANEL
BUN / CREAT RATIO: 20 (ref 12–28)
BUN: 15 mg/dL (ref 8–27)
CHLORIDE: 99 mmol/L (ref 96–106)
CO2: 26 mmol/L (ref 18–29)
Calcium: 9.9 mg/dL (ref 8.7–10.3)
Creatinine, Ser: 0.75 mg/dL (ref 0.57–1.00)
GFR calc non Af Amer: 79 mL/min/{1.73_m2} (ref >59)
GFR, EST AFRICAN AMERICAN: 91 mL/min/{1.73_m2} (ref >59)
GLUCOSE: 83 mg/dL (ref 65–99)
Potassium: 5 mmol/L (ref 3.5–5.2)
SODIUM: 142 mmol/L (ref 134–144)

## 2016-09-08 LAB — SPECIMEN STATUS REPORT

## 2016-09-08 LAB — AMBIG ABBREV BMP8 DEFAULT

## 2016-09-08 LAB — ALT: ALT: 17 IU/L (ref 0–32)

## 2016-10-29 ENCOUNTER — Other Ambulatory Visit: Payer: Self-pay

## 2016-10-29 DIAGNOSIS — I10 Essential (primary) hypertension: Secondary | ICD-10-CM

## 2016-10-30 ENCOUNTER — Other Ambulatory Visit: Payer: Self-pay | Admitting: Family Medicine

## 2016-10-30 MED ORDER — AMPHETAMINE-DEXTROAMPHETAMINE 10 MG PO TABS: 10.0000 mg | ORAL_TABLET | Freq: Two times a day (BID) | ORAL | 0 refills | Status: DC

## 2016-10-30 NOTE — Telephone Encounter (Signed)
Please review refill request for Adderall and authorize if appropriate.  Charyl Bigger, CMA

## 2016-10-30 NOTE — Telephone Encounter (Signed)
Pt called states need Amphetamine/ ADDERALL 10 MG (taken 2xs daily --  [place] @ [facility] [place] [place]--  Please let pt know by phone or Mychart. --glh

## 2016-11-13 ENCOUNTER — Telehealth: Payer: Self-pay

## 2016-11-13 DIAGNOSIS — E782 Mixed hyperlipidemia: Secondary | ICD-10-CM

## 2016-11-13 NOTE — Telephone Encounter (Signed)
RX refill for atorvastatin denied.  Too early for additional refills.  Charyl Bigger, CMA

## 2016-12-05 ENCOUNTER — Ambulatory Visit: Payer: Medicare Other | Admitting: Family Medicine

## 2016-12-27 ENCOUNTER — Encounter: Payer: Self-pay | Admitting: Family Medicine

## 2016-12-27 ENCOUNTER — Ambulatory Visit (INDEPENDENT_AMBULATORY_CARE_PROVIDER_SITE_OTHER): Payer: Medicare Other | Admitting: Family Medicine

## 2016-12-27 VITALS — BP 134/77 | HR 96 | Ht 62.0 in | Wt 131.7 lb

## 2016-12-27 DIAGNOSIS — C801 Malignant (primary) neoplasm, unspecified: Secondary | ICD-10-CM

## 2016-12-27 DIAGNOSIS — Z23 Encounter for immunization: Secondary | ICD-10-CM

## 2016-12-27 DIAGNOSIS — F430 Acute stress reaction: Secondary | ICD-10-CM | POA: Diagnosis not present

## 2016-12-27 DIAGNOSIS — E559 Vitamin D deficiency, unspecified: Secondary | ICD-10-CM | POA: Diagnosis not present

## 2016-12-27 DIAGNOSIS — F988 Other specified behavioral and emotional disorders with onset usually occurring in childhood and adolescence: Secondary | ICD-10-CM | POA: Diagnosis not present

## 2016-12-27 DIAGNOSIS — I10 Essential (primary) hypertension: Secondary | ICD-10-CM

## 2016-12-27 DIAGNOSIS — D229 Melanocytic nevi, unspecified: Secondary | ICD-10-CM

## 2016-12-27 DIAGNOSIS — F329 Major depressive disorder, single episode, unspecified: Secondary | ICD-10-CM

## 2016-12-27 DIAGNOSIS — F32A Depression, unspecified: Secondary | ICD-10-CM

## 2016-12-27 DIAGNOSIS — Z1211 Encounter for screening for malignant neoplasm of colon: Secondary | ICD-10-CM | POA: Diagnosis not present

## 2016-12-27 DIAGNOSIS — E782 Mixed hyperlipidemia: Secondary | ICD-10-CM | POA: Diagnosis not present

## 2016-12-27 DIAGNOSIS — Z1231 Encounter for screening mammogram for malignant neoplasm of breast: Secondary | ICD-10-CM | POA: Diagnosis not present

## 2016-12-27 DIAGNOSIS — Z1239 Encounter for other screening for malignant neoplasm of breast: Secondary | ICD-10-CM

## 2016-12-27 MED ORDER — AMPHETAMINE-DEXTROAMPHETAMINE 10 MG PO TABS: 10.0000 mg | ORAL_TABLET | Freq: Two times a day (BID) | ORAL | 0 refills | Status: DC

## 2016-12-27 NOTE — Progress Notes (Signed)
Impression and Recommendations:    1. HTN (hypertension), benign   2. 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 History of breast Cancer   4. Depression, unspecified depression type   5. Mixed hyperlipidemia   6. Screening for colon cancer   7. Need for pneumococcal vaccination   8. Screening for breast cancer   9. Vitamin D insufficiency   10. Attention deficit disorder, unspecified hyperactivity presence   11. Multiple atypical nevi    - f/up near future for skin bx of two atypical nevi-> L shoulder and R wrist. - HTN- cont meds- well controlled - Mood-  Cont meds at current dose; encouraged to walk; exercise - Need for Mammogram and colonoscopy stressed to pt. Referrals placed - Need for CPE d/c pt - reck labs next OV--> vit D, chol, CMP,  Etc. - mentions flare of her chornic R shoulder pain at end of OV--> will ICE, declines PT, she will f/up with ortho for injection as she did in Dec   The patient was counseled, risk factors were discussed, anticipatory guidance given.    Meds ordered this encounter  Medications  . amphetamine-dextroamphetamine (ADDERALL) 10 MG tablet    Sig: Take 1 tablet (10 mg total) by mouth 2 (two) times daily.    Dispense:  60 tablet    Refill:  0     Discontinued Medications   MULTIPLE VITAMINS-MINERALS (MULTIVITAMIN WOMEN 50+) TABS    Take 1 tablet by mouth daily.      Orders Placed This Encounter  Procedures  . MM Digital Screening  . Pneumococcal conjugate vaccine 13-valent  . Ambulatory referral to Gastroenterology     Gross side effects, risk and benefits, and alternatives of medications and treatment plan in general discussed with patient.  Patient is aware that all medications have potential side effects and we are unable to predict every side effect or drug-drug interaction that may occur.   Patient will call with any questions prior to using medication if they have concerns.  Expresses verbal understanding and consents  to current therapy and treatment regimen.  No barriers to understanding were identified.  Red flag symptoms and signs discussed in detail.  Patient expressed understanding regarding what to do in case of emergency\urgent symptoms  Please see AVS handed out to patient at the end of our visit for further patient instructions/ counseling done pertaining to today's office visit.   Return in about 2 weeks (around 01/10/2017), or 2-3 wks for skin bx * 2, for 17mo for compete physical, come fasting- for bldwrk.     Note: This document was prepared using Dragon voice recognition software and may include unintentional dictation errors.  Denise Wolfe 2:30 PM --------------------------------------------------------------------------------------------------------------------------------------------------------------------------------------------------------------------------------------------    Subjective:    CC:  Chief Complaint  Patient presents with  . Hypertension  . Hyperlipidemia    HPI: Denise Wolfe is a 76 y.o. female who presents to [place] [facility] today for issues as discussed below   HTN- well controlled on meds, no complaints, no sx.  tol well,    Lack of focus: W since chemo txmnt many yrs ago. Needs RF adderall-- really helps her get through her day and focu on chores etc.  No S-E, tol well. Understadns risks meds.  Trying to exercise  Mood:  Winter is tough on her, but stable, no need change in med    Wt Readings from Last 3 Encounters:  12/27/16 131 lb  11.2 oz (59.7 kg)  08/27/16 132 lb (59.9 kg)  08/21/16 132 lb 14.4 oz (60.3 kg)   BP Readings from Last 3 Encounters:  12/27/16 134/77  08/21/16 119/66  08/08/16 (!) 153/84   Pulse Readings from Last 3 Encounters:  12/27/16 96  08/21/16 96  08/08/16 85   BMI Readings from Last 3 Encounters:  12/27/16 24.09 kg/m  08/27/16 24.14 kg/m  08/21/16 24.31 kg/m     Patient Care Team     Relationship Specialty Notifications Start End  [name], DO PCP - General Family Medicine  05/10/16   Silverio Decamp, MD Consulting Physician Family Medicine  07/25/16    Comment: OA - shoulder injeciton     Patient Active Problem List   Diagnosis Date Noted  . HLD (hyperlipidemia) 07/19/2016    Priority: High  . Elevated HDL:  >er 90 07/19/2016    Priority: High  . History of breast Cancer 05/10/2016    Priority: High  . HTN (hypertension), benign 05/10/2016    Priority: High  . 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 Depression 05/10/2016    Priority: Medium  . Vitamin D insufficiency 07/19/2016    Priority: Low  . Degenerative joint disease (DJD) of sternoclavicular joint, right 07/19/2016    Priority: Low  . Pain in joint, shoulder region 05/10/2016    Priority: Low  . Attention deficit disorder 12/27/2016  . Multiple atypical nevi 12/27/2016  . Nonspecific chest pain 08/08/2016  . Retrosternal chest pain 07/19/2016  . Cataract 05/10/2016    Past Medical history, Surgical history, Family history, Social history, Allergies and Medications have been entered into the medical record, reviewed and changed as needed.    Current Meds  Medication Sig  . amLODipine (NORVASC) 5 MG tablet Take 1 tablet (5 mg total) by mouth daily.  Marland Kitchen amphetamine-dextroamphetamine (ADDERALL) 10 MG tablet Take 1 tablet (10 mg total) by mouth 2 (two) times daily.  Marland Kitchen atorvastatin (LIPITOR) 80 MG tablet Take 0.5 tablets (40 mg total) by mouth daily at 6 PM.  . losartan-hydrochlorothiazide (HYZAAR) 50-12.5 MG tablet Start with one half tablet daily for 1 week, then 1 full tablet daily (Patient taking differently: Take 1 tablet by mouth daily. Start with one half tablet daily for 1 week, then 1 full tablet daily)  . sertraline (ZOLOFT) 50 MG tablet Take 1 tablet (50 mg total) by mouth daily.  . Vitamin D, Ergocalciferol, (DRISDOL) 50000 units CAPS capsule  Take 1 capsule (50,000 Units total) by mouth every 7 (seven) days.  . [DISCONTINUED] amphetamine-dextroamphetamine (ADDERALL) 10 MG tablet Take 1 tablet (10 mg total) by mouth 2 (two) times daily.    Allergies:  Allergies  Allergen Reactions  . Ibuprofen Rash     Review of Systems: General:   Denies fever, chills, unexplained weight loss.  Optho/Auditory:   Denies visual changes, blurred vision/LOV Respiratory:   Denies wheeze, DOE more than baseline levels.  Cardiovascular:   Denies chest pain, palpitations, new onset peripheral edema  Gastrointestinal:   Denies nausea, vomiting, diarrhea, abd pain.  Genitourinary: Denies dysuria, freq/ urgency, flank pain or discharge from genitals.  Endocrine:     Denies hot or cold intolerance, polyuria, polydipsia. Musculoskeletal:   Denies unexplained myalgias, joint swelling, unexplained arthralgias, gait problems.  Skin:  Denies new onset rash, suspicious lesions Neurological:     Denies dizziness, unexplained weakness, numbness  Psychiatric/Behavioral:   Denies mood changes, suicidal or homicidal ideations, hallucinations  Objective:   Blood pressure 134/77, pulse 96, height 5\' 2"  (1.575 m), weight 131 lb 11.2 oz (59.7 kg). Body mass index is 24.09 kg/m. General:  Well Developed, well nourished, appropriate for stated age.  Neuro:  Alert and oriented,  extra-ocular muscles intact  HEENT:  Normocephalic, atraumatic, neck supple, no carotid bruits appreciated  Skin:  no gross rash, warm, pink.  L shoulder with hyperpigmented 0.89mm lesion and R wrist- hyperpigmented- irreg shapped lesion- 0.9cm * 0.8 CM diam.  Cardiac:  RRR, S1 S2 Respiratory:  ECTA B/L and A/P, Not using accessory muscles, speaking in full sentences- unlabored. Vascular:  Ext warm, no cyanosis apprec.; cap RF less 2 sec. Psych:  No HI/SI, judgement and insight good, Euthymic mood. Full Affect.

## 2016-12-27 NOTE — Patient Instructions (Signed)
Actinic Keratosis An actinic keratosis is a precancerous growth on the skin. This means that it could develop into skin cancer if it is not treated. About 1% of these growths (actinic keratoses) turn into skin cancer within one year if they are not treated. It is important to have all of these growths evaluated to determine the best treatment approach. What are the causes? This condition is caused by getting too much ultraviolet (UV) radiation from the sun or other UV light sources. What increases the risk? The following factors may make you more likely to develop this condition:  Having light-colored skin and blue eyes.  Having blonde or red hair.  Spending a lot of time in the sun.  Inadequate skin protection when outdoors. This may include:  Not using sunscreen properly.  Not covering up skin that is exposed to sunlight.  Aging. The risk of developing an actinic keratosis increases with age. What are the signs or symptoms? Actinic keratoses look like scaly, rough spots of skin.They can be as small as a pinhead or as big as a quarter. They may itch, hurt, or feel sensitive. In most cases, the growths become red. In some cases, they may be skin-colored, light tan, dark tan, pink, or a combination of any of these colors. There may be a small piece of pink or gray skin (skin tag) growing from the actinic keratosis. In some cases, it may be easier to notice actinic keratoses by feeling them, rather than seeing them. Actinic keratoses appear most often on areas of skin that get a lot of sun exposure, including the scalp, face, ears, lips, upper back, forearms, and the backs of the hands. Sometimes, actinic keratoses disappear, but many reappear a few days to a few weeks later. How is this diagnosed? This condition is usually diagnosed with a physical exam. A tissue sample may be removed from the actinic keratosis and examined under a microscope (biopsy). How is this treated?   Treatment for  this condition may include:  Scraping off the actinic keratosis (curettage).  Freezing the actinic keratosis with liquid nitrogen (cryosurgery). This causes the growth to eventually fall off the skin.  Applying medicated creams or gels to destroy the cells in the growth.  Applying chemicals to the actinic keratosis to make the outer layers of skin peel off (chemical peel).  Photodynamic therapy. In this procedure, medicated cream is applied to the actinic keratosis. This cream increases your skin's sensitivity to light. Then, a strong light is aimed at the actinic keratosis to destroy cells in the growth. Follow these instructions at home: Skin care   Apply cool, wet cloths (cool compresses) to the affected areas.  Do not scratch your skin.  Check your skin regularly for any growths, especially growths that:  Start to itch or bleed.  Change in size, shape, or color. Caring for the treated area   Keep the treated area clean and dry as told by your health care provider.  Do not apply any medicine, cream, or lotion to the treated area unless your health care provider tells you to do that.  Do not pick at blisters or try to break them open. This can cause infection and scarring.  If you have red or irritated skin after treatment, follow instructions from your health care provider about how to take care of the treated area. Make sure you:  Wash your hands with soap and water before you change your bandage (dressing). If soap and water are not available,   use hand sanitizer.  Change your dressing as told by your health care provider.  If you have red or irritated skin after treatment, check your treated area every day for signs of infection. Check for:  Swelling, pain, or more redness.  Fluid or blood.  Warmth.  Pus or a bad smell. General instructions   Take over-the-counter and prescription medicines only as told by your health care provider.  Return to your normal  activities as told by your health care provider. Ask your health care provider what activities are safe for you.  Do not use any tobacco products, such as cigarettes, chewing tobacco, and e-cigarettes. If you need help quitting, ask your health care provider.  Have a skin exam done every year by a health care provider who is a skin conditions specialist (dermatologist).  Keep all follow-up visits as told by your health care provider. This is important. How is this prevented?  Do not get sunburns.  Try to avoid the sun between 10:00 a.m. and 4:00 p.m. This is when the UV light is the strongest.  Use a sunscreen or sunblock with SPF 30 (sun protection factor 30) or greater.  Apply sunscreen before you are exposed to sunlight, and reapply periodically as often as directed by the instructions on the sunscreen container.  Always wear sunglasses that have UV protection, and always wear hats and clothing to protect your skin from sunlight.  When possible, avoid medicines that increase your sensitivity to sunlight. These include:  Certain antibiotic medicines.  Certain water pills (diuretics).  Certain prescription medicines that are used to treat acne (retinoids).  Do not use tanning beds or other indoor tanning devices. Contact a health care provider if:  You notice any changes or new growths on your skin.  You have swelling, pain, or more redness around your treated area.  You have fluid or blood coming from your treated area.  Your treated area feels warm to the touch.  You have pus or a bad smell coming from your treated area.  You have a fever.  You have a blister that becomes large and painful. This information is not intended to replace advice given to you by your health care provider. Make sure you discuss any questions you have with your health care provider. Document Released: 10/26/2008 Document Revised: 03/30/2016 Document Reviewed: 04/09/2015 Elsevier Interactive  Patient Education  2017 [employer]  

## 2017-01-10 ENCOUNTER — Encounter: Payer: Self-pay | Admitting: Family Medicine

## 2017-01-10 ENCOUNTER — Ambulatory Visit: Payer: Medicare Other | Admitting: Family Medicine

## 2017-01-10 VITALS — BP 116/77 | HR 85 | Ht 62.0 in | Wt 131.0 lb

## 2017-01-10 DIAGNOSIS — D229 Melanocytic nevi, unspecified: Secondary | ICD-10-CM

## 2017-01-10 DIAGNOSIS — L814 Other melanin hyperpigmentation: Secondary | ICD-10-CM | POA: Diagnosis not present

## 2017-01-10 DIAGNOSIS — L821 Other seborrheic keratosis: Secondary | ICD-10-CM | POA: Diagnosis not present

## 2017-01-10 NOTE — Patient Instructions (Addendum)
- Contact [facility] to schedule appointment for colonoscopy screen [phone].  Denise Wolfe can you please let patient know where we sent the mammogram referral to last office visit.  I gave her information for [facility].        Skin biopsy  Overview A skin biopsy removes cells or skin samples from the surface of your body. The sample taken from a skin biopsy is examined to provide information about your medical condition. A doctor uses a skin biopsy to diagnose or rule out certain skin conditions and diseases.  Three main types of skin biopsies are:  Shave biopsy. A doctor uses a tool similar to a razor to remove a small section of the top layers of skin (epidermis and a portion of the dermis). Punch biopsy. A doctor uses a circular tool to remove a small section of skin including deeper layers (epidermis, dermis and superficial fat). Excisional biopsy. A doctor uses a small knife (scalpel) to remove an entire lump or an area of abnormal skin, including a portion of normal skin down to or through the fatty layer of skin. Why it's done A skin biopsy is used to diagnose or rule out skin conditions and diseases. It may also be used to remove skin lesions.  A skin biopsy may be necessary to diagnose or to help treat skin conditions and diseases, including:  Actinic keratosis Bullous pemphigoid and other blistering skin disorders Dermatitis, psoriasis and other inflammatory skin conditions Skin cancers, including basal cell carcinoma, squamous cell carcinoma and melanoma Skin infection Skin tags Suspicious moles or other growths Warts Risks A skin biopsy is a generally safe procedure, but complications can occur, including:  Bleeding Bruising Scarring Infection Allergic reaction to the topical antibiotic How you prepare Before the skin biopsy, tell your doctor if you:  Have been diagnosed with a bleeding disorder Have experienced excessive  bleeding after other medical procedures Are taking blood-thinning medications, such as aspirin, aspiring-containing medications, warfarin (Coumadin) or heparin Have a history of skin infections, including impetigo Are taking medications that suppress the immune system, such as diabetes medications or medications used after an organ transplant What you can expect Depending on the location of the skin biopsy, you may be asked to undress and change into a clean gown. A doctor or nurse then cleans the area of the skin to be biopsied. Your skin may be marked with a surgical marker or marking pen to outline the biopsy area.  You then receive a local anesthetic to numb the biopsy site. This is usually given by injection with a thin needle. The numbing medication can cause a burning sensation in the skin for a few seconds. Afterward, the biopsy site is numb and you shouldn't feel any pain or discomfort during the skin biopsy.  During the skin biopsy What you can expect during your skin biopsy depends on the type of biopsy you'll undergo.  For a shave biopsy, your doctor uses a sharp tool, double-edged razor or scalpel to cut the tissue. The depth of the incision varies depending on the type of biopsy and the part of the body being biopsied. A shave biopsy causes bleeding. Bleeding is stopped by applying pressure to the area or by a combination of pressure and a topical medication applied to the biopsy site. For a punch biopsy or an excisional biopsy, the procedure involves cutting into the top layer of fat beneath the skin, so stitches may be needed to close the wound. A  dressing or adhesive bandage is then placed over the site to protect the wound and prevent bleeding. A skin biopsy typically takes about 15 minutes total, including the preparation time, dressing the wound and instructions for at-home care.  After the skin biopsy Your doctor may instruct you to keep the bandage over the biopsy site until  the next day. Occasionally, the biopsy site bleeds after you leave the doctor's office. This is more likely in people taking blood-thinning medications. If this occurs, apply direct pressure to the wound for 10 to 20 minutes. If bleeding continues, contact your health care provider.  All biopsies cause a small scar. Some people develop a prominent, raised scar. The risk of this is increased when a biopsy is done on the neck or upper torso, such as the back or chest. Initially, the scar will be pink and then fade to white or sometimes brown. Scars fade gradually. The scar's permanent color will be evident one or two years after the biopsy.  Try not to bump the biopsy site area or do activities that might stretch the skin. Stretching the skin could cause the wound to bleed or enlarge the scar.  Healing of the wound can take several weeks, but is usually complete within two months. Wounds on legs and feet tend to heal slower than those on other areas of the body.  How to care for the biopsy site while it heals:  Wash your hands with soap and water before touching the biopsy site. Wash the biopsy site with soap and water. If the biopsy site is on your scalp, use shampoo. Rinse the site well. Pat the site dry with a clean towel. Cover the site with an adhesive bandage that allows the skin to ventilate. Continue caring for the biopsy site until the stitches are removed. For shave biopsies that don't require stitches, continue wound care until the skin is healed.  Results After the biopsy procedure, your doctor sends the sample to a laboratory for testing. Depending on the skin condition, type of biopsy and the laboratory procedures, results may take several days or a couple of weeks. Results of biopsies for metabolic or genetic testing can take several months or more.  Your doctor may schedule an office appointment to discuss the results of the test. If possible, bring along a family member or friend.  It can be difficult to absorb all the information provided during an appointment. The person who accompanies you may remember something that you forgot or missed.  Write down questions that you want to ask your doctor. Don't be afraid to ask questions or to speak up when you don't understand something. Questions you may want to ask include:  Based on the results, what are my next steps? What kind of follow-up, if any, should I expect? Are there any factors that might have affected the results of this test and, therefore, may have altered the results? Will I need to repeat the test at some point? If the skin biopsy showed skin cancer, was all of the cancer removed, or will I need additional treatment?

## 2017-01-10 NOTE — Progress Notes (Signed)
Pt here for an acute care OV today   Impression and Recommendations:    1. Multiple atypical nevi      No problem-specific Assessment & Plan notes found for this encounter.   The patient was counseled, risk factors were discussed, anticipatory guidance given.    No orders of the defined types were placed in this encounter.    Discontinued Medications   AMLODIPINE (NORVASC) 5 MG TABLET    Take 1 tablet (5 mg total) by mouth daily.   AMPHETAMINE-DEXTROAMPHETAMINE (ADDERALL) 10 MG TABLET    Take 1 tablet (10 mg total) by mouth 2 (two) times daily.   LOSARTAN-HYDROCHLOROTHIAZIDE (HYZAAR) 50-12.5 MG TABLET    Start with one half tablet daily for 1 week, then 1 full tablet daily   SERTRALINE (ZOLOFT) 50 MG TABLET    Take 1 tablet (50 mg total) by mouth daily.   VITAMIN D, ERGOCALCIFEROL, (DRISDOL) 50000 UNITS CAPS CAPSULE    Take 1 capsule (50,000 Units total) by mouth every 7 (seven) days.      No orders of the defined types were placed in this encounter.    Gross side effects, risk and benefits, and alternatives of medications and treatment plan in general discussed with patient.  Patient is aware that all medications have potential side effects and we are unable to predict every side effect or drug-drug interaction that may occur.   Patient will call with any questions prior to using medication if they have concerns.  Expresses verbal understanding and consents to current therapy and treatment regimen.  No barriers to understanding were identified.  Red flag symptoms and signs discussed in detail.  Patient expressed understanding regarding what to do in case of emergency\urgent symptoms  Please see AVS handed out to patient at the end of our visit for further patient instructions/ counseling done pertaining to today's office visit.   Return if symptoms worsen or fail to improve, for Follow-up chronic disease management as previously discussed..     Note: This document  was prepared using Dragon voice recognition software and may include unintentional dictation errors.  Denise Wolfe 2:12 PM --------------------------------------------------------------------------------------------------------------------------------------------------------------------------------------------------------------------------------------------    Subjective:    CC:  Chief Complaint  Patient presents with  . Skin Problem    HPI: Denise Wolfe is a 76 y.o. female who presents to [place] at [facility] today for issues as discussed below.   Lesion on R wrist- radial side- been there 5-7 yrs or so.  Pt denies change but daughter thinks it has increased in size.  NO other sx.   --> skin cancer in MOM-  Melanoma she thinks but not sure at all. Died of something else--> breast CA at age 75.    L shoulder hyperpigmented skin lesion- been there- not sure b/c never noticed it.    Wt Readings from Last 3 Encounters:  09/30/18 131 lb (59.4 kg)  09/03/18 130 lb 9.6 oz (59.2 kg)  06/26/18 128 lb 11.2 oz (58.4 kg)   BP Readings from Last 3 Encounters:  09/30/18 139/83  09/03/18 (!) 149/101  06/26/18 128/76   BMI Readings from Last 3 Encounters:  09/30/18 23.96 kg/m  09/03/18 23.89 kg/m  06/26/18 23.54 kg/m     Patient Care Team    Relationship Specialty Notifications Start End  Denise Dance, DO PCP - General Family Medicine  05/10/16   Silverio Decamp, MD Consulting Physician Family Medicine  07/25/16    Comment: OA - shoulder injeciton  Truitt Merle, MD Consulting Physician Hematology  19/37/90   Leighton Ruff, MD Consulting Physician General Surgery  07/23/17      Patient Active Problem List   Diagnosis Date Noted  . Squamous cell carcinoma of rectum (HCC) 02/14/2017    Priority: High  . HLD (hyperlipidemia) 07/19/2016    Priority: High  . Elevated HDL:  >er 90 07/19/2016    Priority: High  . History of breast Cancer  05/10/2016    Priority: High  . HTN (hypertension), benign 05/10/2016    Priority: High  . 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 Depression, recurrent ([place]) 05/10/2016    Priority: Medium  . Chemotherapy induced neutropenia (HCC) 04/14/2017    Priority: Low  . Vitamin D insufficiency 07/19/2016    Priority: Low  . Degenerative joint disease (DJD) of sternoclavicular joint, right 07/19/2016    Priority: Low  . Pain in joint, shoulder region 05/10/2016    Priority: Low  . Personal history of radiation therapy 09/30/2018  . Personal history of chemotherapy 09/30/2018  . Anemia due to antineoplastic chemotherapy 05/14/2017  . Pneumonia of left lower lobe due to infectious organism ([place]) 05/09/2017  . Difficulty sleeping 05/09/2017  . Burning sensation- scalp esp at night 05/09/2017  . Wheeze 05/09/2017  . Colon cancer ([place]) 02/11/2017  . Attention deficit disorder 12/27/2016  . Multiple atypical nevi 12/27/2016  . Nonspecific chest pain 08/08/2016  . Retrosternal chest pain 07/19/2016  . Cataract 05/10/2016  . Breast cancer ([place]) 03/13/2000    Past Medical history, Surgical history, Family history, Social history, Allergies and Medications have been entered into the medical record, reviewed and changed as needed.    Current Meds  Medication Sig  . atorvastatin (LIPITOR) 80 MG tablet Take 0.5 tablets (40 mg total) by mouth daily at 6 PM.  . [DISCONTINUED] amLODipine (NORVASC) 5 MG tablet Take 1 tablet (5 mg total) by mouth daily.  . [DISCONTINUED] amphetamine-dextroamphetamine (ADDERALL) 10 MG tablet Take 1 tablet (10 mg total) by mouth 2 (two) times daily.  . [DISCONTINUED] losartan-hydrochlorothiazide (HYZAAR) 50-12.5 MG tablet Start with one half tablet daily for 1 week, then 1 full tablet daily (Patient taking differently: Take 1 tablet by mouth daily. Start with one half tablet daily for 1 week, then 1 full tablet daily)  . [DISCONTINUED]  sertraline (ZOLOFT) 50 MG tablet Take 1 tablet (50 mg total) by mouth daily.  . [DISCONTINUED] Vitamin D, Ergocalciferol, (DRISDOL) 50000 units CAPS capsule Take 1 capsule (50,000 Units total) by mouth every 7 (seven) days. (Patient not taking: Reported on 01/30/2017)   Current Facility-Administered Medications for the 01/10/17 encounter (Procedure visit) with Denise Dance, DO  Medication  . 0.9 %  sodium chloride infusion    Allergies:  Allergies  Allergen Reactions  . Ibuprofen Rash     Review of Systems: General:   Denies fever, chills, unexplained weight loss.  Optho/Auditory:   Denies visual changes, blurred vision/LOV Respiratory:   Denies wheeze, DOE more than baseline levels.  Cardiovascular:   Denies chest pain, palpitations, new onset peripheral edema  Gastrointestinal:   Denies nausea, vomiting, diarrhea, abd pain.  Genitourinary: Denies dysuria, freq/ urgency, flank pain or discharge from genitals.  Endocrine:     Denies hot or cold intolerance, polyuria, polydipsia. Musculoskeletal:   Denies unexplained myalgias, joint swelling, unexplained arthralgias, gait problems.  Skin:  Denies new onset rash, suspicious lesions Neurological:     Denies dizziness, unexplained weakness, numbness  Psychiatric/Behavioral:   Denies mood changes, suicidal or homicidal ideations, hallucinations    Objective:   Blood pressure 116/77, pulse 85, height 5\' 2"  (1.575 m), weight 131 lb (59.4 kg), SpO2 97 %. Body mass index is 23.96 kg/m. General:  Well Developed, well nourished, appropriate for stated age.  Neuro:  Alert and oriented,  extra-ocular muscles intact  HEENT:  Normocephalic, atraumatic, neck supple Skin:  no gross rash, warm, pink. Cardiac:  RRR, S1 S2 Respiratory:  ECTA B/L and A/P, Not using accessory muscles, speaking in full sentences- unlabored. Vascular:  Ext warm, no cyanosis apprec.; cap RF less 2 sec. Psych:  No HI/SI, judgement and insight good, Euthymic mood.  Full Affect.

## 2017-01-23 ENCOUNTER — Telehealth: Payer: Self-pay

## 2017-01-23 NOTE — Telephone Encounter (Signed)
Pharmacy sent refill request for Sertraline 50mg . Reviewed medication in chart, patient is not due for refill.  Called pharmacy and verified that patient has refill on medication.  MPulliam, CMA

## 2017-01-24 ENCOUNTER — Encounter: Payer: Self-pay | Admitting: Internal Medicine

## 2017-01-30 ENCOUNTER — Ambulatory Visit (AMBULATORY_SURGERY_CENTER): Payer: Self-pay | Admitting: *Deleted

## 2017-01-30 VITALS — Ht 62.0 in | Wt 130.8 lb

## 2017-01-30 DIAGNOSIS — Z1211 Encounter for screening for malignant neoplasm of colon: Secondary | ICD-10-CM

## 2017-01-30 NOTE — Progress Notes (Signed)
No allergies to eggs or soy. Anesthesia problem with hysterectomy 2002; aspiration pneumonia  Pt given Emmi instructions for colonoscopy  No oxygen use  No diet drug use

## 2017-02-11 ENCOUNTER — Encounter: Payer: Self-pay | Admitting: Internal Medicine

## 2017-02-11 ENCOUNTER — Ambulatory Visit (AMBULATORY_SURGERY_CENTER): Payer: Medicare Other | Admitting: Internal Medicine

## 2017-02-11 VITALS — BP 161/65 | HR 78 | Temp 98.0°F | Resp 15 | Ht 62.0 in | Wt 130.0 lb

## 2017-02-11 DIAGNOSIS — Z1212 Encounter for screening for malignant neoplasm of rectum: Secondary | ICD-10-CM

## 2017-02-11 DIAGNOSIS — C189 Malignant neoplasm of colon, unspecified: Secondary | ICD-10-CM

## 2017-02-11 DIAGNOSIS — Z1211 Encounter for screening for malignant neoplasm of colon: Secondary | ICD-10-CM

## 2017-02-11 DIAGNOSIS — D124 Benign neoplasm of descending colon: Secondary | ICD-10-CM

## 2017-02-11 DIAGNOSIS — C20 Malignant neoplasm of rectum: Secondary | ICD-10-CM | POA: Diagnosis not present

## 2017-02-11 DIAGNOSIS — D128 Benign neoplasm of rectum: Secondary | ICD-10-CM

## 2017-02-11 DIAGNOSIS — D129 Benign neoplasm of anus and anal canal: Secondary | ICD-10-CM

## 2017-02-11 HISTORY — DX: Malignant neoplasm of colon, unspecified: C18.9

## 2017-02-11 MED ORDER — SODIUM CHLORIDE 0.9 % IV SOLN: 500.0000 mL | INTRAVENOUS | Status: DC

## 2017-02-11 NOTE — Progress Notes (Signed)
A/ox3 pleased with MAC, report to Karen RN 

## 2017-02-11 NOTE — Op Note (Signed)
[place] Patient Name: Denise Wolfe Procedure Date: 02/11/2017 9:28 AM MRN: [id number] Endoscopist: [name] , MD Age: 76 Referring MD:  Date of Birth: 30-Jun-1941 Gender: Female Account #: [id number] Procedure:                Colonoscopy Indications:              Screening for colorectal malignant neoplasm Medicines:                Propofol per Anesthesia, Monitored Anesthesia Care Procedure:                Pre-Anesthesia Assessment:                           - Prior to the procedure, a History and Physical                            was performed, and patient medications and                            allergies were reviewed. The patient's tolerance of                            previous anesthesia was also reviewed. The risks                            and benefits of the procedure and the sedation                            options and risks were discussed with the patient.                            All questions were answered, and informed consent                            was obtained. Prior Anticoagulants: The patient has                            taken no previous anticoagulant or antiplatelet                            agents. ASA Grade Assessment: II - A patient with                            mild systemic disease. After reviewing the risks                            and benefits, the patient was deemed in                            satisfactory condition to undergo the procedure.                           After obtaining informed consent, the colonoscope  was passed under direct vision. Throughout the                            procedure, the patient's blood pressure, pulse, and                            oxygen saturations were monitored continuously. The                            Colonoscope was introduced through the anus and                            advanced to the the cecum, identified by   appendiceal orifice and ileocecal valve. The                            colonoscopy was performed without difficulty. The                            patient tolerated the procedure well. The quality                            of the bowel preparation was excellent. The bowel                            preparation used was Miralax. The ileocecal valve,                            appendiceal orifice, and rectum were photographed. Scope In: 9:37:58 AM Scope Out: 9:53:55 AM Scope Withdrawal Time: 0 hours 11 minutes 26 seconds  Total Procedure Duration: 0 hours 15 minutes 57 seconds  Findings:                 The perianal examination was normal.                           The digital rectal exam revealed a 1 -1.5 cm                            (diameter) firm rectal mass. The mass was                            non-circumferential and located predominantly at                            the anterior bowel wall.                           A 12 to 15 mm polyp was found in the rectum. The                            polyp was semi-sessile. Biopsies were taken with a  cold forceps for histology. Verification of patient                            identification for the specimen was done. Estimated                            blood loss was minimal.                           A 2 mm polyp was found in the descending colon. The                            polyp was sessile. The polyp was removed with a                            cold biopsy forceps. Resection and retrieval were                            complete. Verification of patient identification                            for the specimen was done. Estimated blood loss was                            minimal.                           The exam was otherwise without abnormality on                            direct and retroflexion views. Complications:            No immediate complications. Estimated Blood Loss:     Estimated  blood loss was minimal. Impression:               - One 12 to 15 mm polyp in the rectum (vs proximal                            anus). Biopsied. Firm, - just inside anal verge or                            on it?                           - One 2 mm polyp in the descending colon, removed                            with a cold biopsy forceps. Resected and retrieved.                           - The examination was otherwise normal on direct                            and retroflexion views. Recommendation:           -  Patient has a contact number available for                            emergencies. The signs and symptoms of potential                            delayed complications were discussed with the                            patient. Return to normal activities tomorrow.                            Written discharge instructions were provided to the                            patient.                           - Resume previous diet.                           - Continue present medications.                           - Await pathology results.                           - Repeat colonoscopy is recommended. The                            colonoscopy date will be determined after pathology                            results from today's exam become available for                            review. [name], MD 02/11/2017 10:05:10 AM This report has been signed electronically.

## 2017-02-11 NOTE — Progress Notes (Signed)
Called to room to assist during endoscopic procedure.  Patient ID and intended procedure confirmed with present staff. Received instructions for my participation in the procedure from the performing physician.  

## 2017-02-11 NOTE — Patient Instructions (Addendum)
I found and removed one polyp and biopsied a larger polyp in the rectum. The polyp in the rectum will probably need to be removed surgically - it could possibly be malignant. I will let you know. Hopefully by tomorrow if not by Thurdsay.  I appreciate the opportunity to care for you. [name], MD, [facility]  HANDOUT GIVEN: POLYPS  YOU HAD AN ENDOSCOPIC PROCEDURE TODAY AT [place]:   Refer to the procedure report that was given to you for any specific questions about what was found during the examination.  If the procedure report does not answer your questions, please call your gastroenterologist to clarify.  If you requested that your care partner not be given the details of your procedure findings, then the procedure report has been included in a sealed envelope for you to review at your convenience later.  YOU SHOULD EXPECT: Some feelings of bloating in the abdomen. Passage of more gas than usual.  Walking can help get rid of the air that was put into your GI tract during the procedure and reduce the bloating. If you had a lower endoscopy (such as a colonoscopy or flexible sigmoidoscopy) you may notice spotting of blood in your stool or on the toilet paper. If you underwent a bowel prep for your procedure, you may not have a normal bowel movement for a few days.  Please Note:  You might notice some irritation and congestion in your nose or some drainage.  This is from the oxygen used during your procedure.  There is no need for concern and it should clear up in a day or so.  SYMPTOMS TO REPORT IMMEDIATELY:   Following lower endoscopy (colonoscopy or flexible sigmoidoscopy):  Excessive amounts of blood in the stool  Significant tenderness or worsening of abdominal pains  Swelling of the abdomen that is new, acute  Fever of 100F or higher   For urgent or emergent issues, a gastroenterologist can be reached at any hour by calling [phone].   DIET:   We do recommend a small meal at first, but then you may proceed to your regular diet.  Drink plenty of fluids but you should avoid alcoholic beverages for 24 hours.  ACTIVITY:  You should plan to take it easy for the rest of today and you should NOT DRIVE or use heavy machinery until tomorrow (because of the sedation medicines used during the test).    FOLLOW UP: Our staff will call the number listed on your records the next business day following your procedure to check on you and address any questions or concerns that you may have regarding the information given to you following your procedure. If we do not reach you, we will leave a message.  However, if you are feeling well and you are not experiencing any problems, there is no need to return our call.  We will assume that you have returned to your regular daily activities without incident.  If any biopsies were taken you will be contacted by phone or by letter within the next 1-3 weeks.  Please call us at [phone] if you have not heard about the biopsies in 3 weeks.    SIGNATURES/CONFIDENTIALITY: You and/or your care partner have signed paperwork which will be entered into your electronic medical record.  These signatures attest to the fact that that the information above on your After Visit Summary has been reviewed and is understood.  Full responsibility of the confidentiality  of this discharge information lies with you and/or your care-partner.

## 2017-02-11 NOTE — Progress Notes (Signed)
Per Dr. Carlean Purl send specimens [facility].  Gilford Rile was notified to pick up specimen. maw

## 2017-02-11 NOTE — Progress Notes (Signed)
Pt's states no medical or surgical changes since previsit or office visit. 

## 2017-02-12 ENCOUNTER — Telehealth: Payer: Self-pay | Admitting: *Deleted

## 2017-02-12 NOTE — Telephone Encounter (Signed)
  Follow up Call-  Call back number 02/11/2017  Post procedure Call Back phone  # [phone]  Permission to leave phone message Yes  Some recent data might be hidden     Patient questions:  Do you have a fever, pain , or abdominal swelling? No. Pain Score  0 *  Have you tolerated food without any problems? Yes.    Have you been able to return to your normal activities? Yes.    Do you have any questions about your discharge instructions: Diet   No. Medications  No. Follow up visit  No.  Do you have questions or concerns about your Care? No.  Actions: * If pain score is 4 or above: No action needed, pain <4.

## 2017-02-14 ENCOUNTER — Encounter: Payer: Self-pay | Admitting: Internal Medicine

## 2017-02-14 ENCOUNTER — Encounter: Payer: Self-pay | Admitting: Radiation Oncology

## 2017-02-14 ENCOUNTER — Other Ambulatory Visit: Payer: Self-pay

## 2017-02-14 DIAGNOSIS — C20 Malignant neoplasm of rectum: Secondary | ICD-10-CM

## 2017-02-14 HISTORY — DX: Malignant neoplasm of rectum: C20

## 2017-02-14 NOTE — Progress Notes (Signed)
I spoke to patient and told her she has squamous cell carcinoma of rectum 1) CBC, CMET CEA 2) Refer to Medical Oncology Dr. Burr Medico or Benay Spice 3) Refer to Dr. Lisbeth Renshaw Radiation oncology 4) refer to Dr. Johney Maine or Marcello Moores Surgery -  5) [place] - no letter - flex sig recall 1 year

## 2017-02-15 ENCOUNTER — Other Ambulatory Visit (INDEPENDENT_AMBULATORY_CARE_PROVIDER_SITE_OTHER): Payer: Medicare Other

## 2017-02-15 ENCOUNTER — Encounter: Payer: Self-pay | Admitting: Nurse Practitioner

## 2017-02-15 ENCOUNTER — Telehealth: Payer: Self-pay | Admitting: Nurse Practitioner

## 2017-02-15 DIAGNOSIS — C20 Malignant neoplasm of rectum: Secondary | ICD-10-CM | POA: Diagnosis not present

## 2017-02-15 LAB — COMPREHENSIVE METABOLIC PANEL
ALT: 12 U/L (ref 0–35)
AST: 15 U/L (ref 0–37)
Albumin: 4.3 g/dL (ref 3.5–5.2)
Alkaline Phosphatase: 40 U/L (ref 39–117)
BUN: 21 mg/dL (ref 6–23)
CALCIUM: 9.5 mg/dL (ref 8.4–10.5)
CHLORIDE: 103 meq/L (ref 96–112)
CO2: 28 meq/L (ref 19–32)
CREATININE: 0.8 mg/dL (ref 0.40–1.20)
GFR: 74.49 mL/min (ref >60.00)
Glucose, Bld: 107 mg/dL — ABNORMAL HIGH (ref 70–99)
Potassium: 3.9 mEq/L (ref 3.5–5.1)
SODIUM: 139 meq/L (ref 135–145)
Total Bilirubin: 0.4 mg/dL (ref 0.2–1.2)
Total Protein: 6.6 g/dL (ref 6.0–8.3)

## 2017-02-15 LAB — CBC WITH DIFFERENTIAL/PLATELET
BASOS PCT: 1.9 % (ref 0.0–3.0)
Basophils Absolute: 0.1 10*3/uL (ref 0.0–0.1)
EOS ABS: 0.2 10*3/uL (ref 0.0–0.7)
Eosinophils Relative: 5.3 % — ABNORMAL HIGH (ref 0.0–5.0)
HEMATOCRIT: 36.7 % (ref 36.0–46.0)
Hemoglobin: 13 g/dL (ref 12.0–15.0)
LYMPHS ABS: 1.7 10*3/uL (ref 0.7–4.0)
Lymphocytes Relative: 38.4 % (ref 12.0–46.0)
MCHC: 35.5 g/dL (ref 30.0–36.0)
MCV: 91.2 fl (ref 78.0–100.0)
MONO ABS: 0.6 10*3/uL (ref 0.1–1.0)
Monocytes Relative: 14.3 % — ABNORMAL HIGH (ref 3.0–12.0)
NEUTROS ABS: 1.7 10*3/uL (ref 1.4–7.7)
Neutrophils Relative %: 40.1 % — ABNORMAL LOW (ref 43.0–77.0)
PLATELETS: 356 10*3/uL (ref 150.0–400.0)
RBC: 4.02 Mil/uL (ref 3.87–5.11)
RDW: 13 % (ref 11.5–15.5)
WBC: 4.3 10*3/uL (ref 4.0–10.5)

## 2017-02-15 NOTE — Telephone Encounter (Signed)
Appt has been scheduld for the pt to see Ned Card on 7/13 at 10:15am. Left the appt with the appt date and time on the pt's vm. Letter mailed.

## 2017-02-16 LAB — CEA: CEA: 1.8 ng/mL

## 2017-02-19 ENCOUNTER — Ambulatory Visit (INDEPENDENT_AMBULATORY_CARE_PROVIDER_SITE_OTHER)
Admission: RE | Admit: 2017-02-19 | Discharge: 2017-02-19 | Disposition: A | Discharge status: Home / Self Care | Payer: Medicare Other | Source: Ambulatory Visit | Attending: Internal Medicine | Admitting: Internal Medicine

## 2017-02-19 DIAGNOSIS — C20 Malignant neoplasm of rectum: Secondary | ICD-10-CM

## 2017-02-19 DIAGNOSIS — R911 Solitary pulmonary nodule: Secondary | ICD-10-CM | POA: Diagnosis not present

## 2017-02-19 DIAGNOSIS — C218 Malignant neoplasm of overlapping sites of rectum, anus and anal canal: Secondary | ICD-10-CM | POA: Diagnosis not present

## 2017-02-19 MED ORDER — IOPAMIDOL (ISOVUE-300) INJECTION 61%
100.0000 mL | Freq: Once | INTRAVENOUS | Status: AC | PRN
  Administered 2017-02-19: 100 mL via INTRAVENOUS

## 2017-02-20 ENCOUNTER — Encounter: Payer: Self-pay | Admitting: Radiation Oncology

## 2017-02-20 NOTE — Progress Notes (Signed)
Radiation Oncology         [phone] ________________________________  Name: Denise Wolfe MRN: [id number]  Date: 02/21/2017  DOB: [birth year]  [id number], Neoma Laming, DO  [name], MD     REFERRING PHYSICIAN: [name], MD   DIAGNOSIS: The primary encounter diagnosis was Anal cancer [facility]). Diagnoses of Rectal cancer ([place]), Squamous cell carcinoma of rectum ([place]), Cancer of anal canal ([place]), and History of breast Cancer were also pertinent to this visit.   HISTORY OF PRESENT ILLNESS: Denise Wolfe is a 76 y.o. female seen at the request of Dr. 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 She presented for screening colonoscopy on 02/11/2017 with Dr. Carlean Purl which revealed a  12 to 15 mm semi-sessile polyp in the rectum (vs proximal anus) and one 2 mm sessile polyp in the descending colon. Biopsy of the descending polyp showed tubular adenoma (one fragment) and no high grade dysplasia or malignancy. Biopsy of the rectum polyp confirmed squamous cell carcinoma, basaloid-type. There are multiple fragments of rectal mucosa which are involved by poorly differentiated squamous cell carcinoma with basaloid features.  CT C/A/P on 02/19/2017 shows abnormal right pelvic sidewall lymph node measuring 1.9 x 1.6 cm, highly suspicious for metastatic disease. There is asymmetric wall thickening distal rectum, presumably the location of primary neoplasm. There is asymmetric airspace opacity in the left apex with a wedge shaped configuration on coronal imaging, this may be an area of scarring or potentially infectious/inflammatory etiology. There is a tiny right perifissural nodule in the right middle lobe, likely subpleural lymph node. Also noted were multiple low-density well-defined liver lesion, most suggestive of cysts. She comes today to discuss options of radiotherapy. She is to see Dr. Marcello Moores on 03/05/17, and Dr. Benay Spice tomorrow.  PREVIOUS RADIATION THERAPY: Yes    2001: External radiotherapy over about 6 weeks to the left chest wall and regional nodes following mastectomy   PAST MEDICAL HISTORY:  Past Medical History:  Diagnosis Date  . Anemia    during childhood  . Arthritis    hands, shoulder  . Breast cancer ([place]) 03/2000   left side modified mastecomy  . Cancer [facility])    breast, age 84; left side  . Colon cancer ([place]) 02/11/2017   rectum-squamous cell carcinoma  . Depression   . Hypertension   . Squamous cell carcinoma of rectum ([place]) 02/14/2017       PAST SURGICAL HISTORY: Past Surgical History:  Procedure Laterality Date  . ABDOMINAL HYSTERECTOMY     With bilateral oophorectomy. 2002.  Marland Kitchen BREAST BIOPSY Right 2004   benign  . BREAST SURGERY Left 2001   biopsy  . CATARACT EXTRACTION Bilateral 2017  . MASTECTOMY MODIFIED RADICAL Left 2001  . MENISCUS DEBRIDEMENT Right 2003  . TUMOR EXCISION Right    thigh area     FAMILY HISTORY:  Family History  Problem Relation Age of Onset  . Cancer Mother 72       breast  . Heart disease Father   . Colon cancer Neg Hx   . Esophageal cancer Neg Hx   . Rectal cancer Neg Hx   . Stomach cancer Neg Hx      SOCIAL HISTORY:  reports that she has never smoked. She has never used smokeless tobacco. She reports that she drinks about 8.4 oz of alcohol per week . She reports that she does not use drugs.  The patient is widowed. She's a retired Museum/gallery curator and following  retirement moved to [place] to raise [place]. She relocated to [place] to be closer to family when her husband became ill.   ALLERGIES: Ibuprofen   MEDICATIONS:  Current Outpatient Prescriptions  Medication Sig Dispense Refill  . amLODipine (NORVASC) 5 MG tablet Take 1 tablet (5 mg total) by mouth daily. 90 tablet 3  . amphetamine-dextroamphetamine (ADDERALL) 10 MG tablet Take 1 tablet (10 mg total) by mouth 2 (two) times daily. 60 tablet 0  . atorvastatin (LIPITOR) 80 MG tablet Take 0.5 tablets (40 mg total) by mouth  daily at 6 PM. 45 tablet 3  . cholecalciferol (VITAMIN D) 400 units TABS tablet Take 400 Units by mouth daily.    Marland Kitchen losartan-hydrochlorothiazide (HYZAAR) 50-12.5 MG tablet Start with one half tablet daily for 1 week, then 1 full tablet daily (Patient taking differently: Take 1 tablet by mouth daily. Start with one half tablet daily for 1 week, then 1 full tablet daily) 90 tablet 3  . sertraline (ZOLOFT) 50 MG tablet Take 1 tablet (50 mg total) by mouth daily. 90 tablet 3   Current Facility-Administered Medications  Medication Dose Route Frequency Provider Last Rate Last Dose  . 0.9 %  sodium chloride infusion  500 mL Intravenous Continuous [name], MD         REVIEW OF SYSTEMS: On review of systems, the patient reports that she is doing well overall. She reports that she has noted 5-6 episodes of rectal bleeding following a bowel movement in the last 3 months. She denies any chest pain, shortness of breath, cough, fevers, chills, night sweats, unintended weight changes. She denies any bowel or bladder disturbances, and denies abdominal pain, nausea or vomiting. She denies any new musculoskeletal or joint aches or pains. A complete review of systems is obtained and is otherwise negative.     PHYSICAL EXAM:  Wt Readings from Last 3 Encounters:  02/21/17 132 lb 9.6 oz (60.1 kg)  02/11/17 130 lb (59 kg)  01/30/17 130 lb 12.8 oz (59.3 kg)   Temp Readings from Last 3 Encounters:  02/21/17 (!) 97 F (36.1 C) (Oral)  02/11/17 98 F (36.7 C)   BP Readings from Last 3 Encounters:  02/21/17 (!) 145/81  02/11/17 (!) 161/65  01/10/17 116/77   Pulse Readings from Last 3 Encounters:  02/21/17 (!) 105  02/11/17 78  01/10/17 85   Pain Assessment Pain Score: 2  Pain Loc: Shoulder (right)/10  In general this is a well appearing caucasian female in no acute distress. She is alert and oriented x4 and appropriate throughout the examination. HEENT reveals that the patient is normocephalic,  atraumatic. EOMs are intact. PERRLA. Skin is intact without any evidence of gross lesions. Cardiovascular exam reveals a regular rate and rhythm, no clicks rubs or murmurs are auscultated. Chest is clear to auscultation bilaterally. Lymphatic assessment is performed and does not reveal any adenopathy in the cervical, supraclavicular, axillary, or inguinal chains. Abdomen has active bowel sounds in all quadrants and is intact. The abdomen is soft, non tender, non distended. Lower extremities are negative for pretibial pitting edema, deep calf tenderness, cyanosis or clubbing.   ECOG = 1  0 - Asymptomatic (Fully active, able to carry on all predisease activities without restriction)  1 - Symptomatic but completely ambulatory (Restricted in physically strenuous activity but ambulatory and able to carry out work of a light or sedentary nature. For example, light housework, office work)  2 - Symptomatic, <50% in bed during the day (Ambulatory and  capable of all self care but unable to carry out any work activities. Up and about more than 50% of waking hours)  3 - Symptomatic, >50% in bed, but not bedbound (Capable of only limited self-care, confined to bed or chair 50% or more of waking hours)  4 - Bedbound (Completely disabled. Cannot carry on any self-care. Totally confined to bed or chair)  5 - Death   Eustace Pen MM, Creech RH, Tormey DC, et al. [phone]). "Toxicity and response criteria of the [facility]". [place] Oncol. 5 (6): 649-55    LABORATORY DATA:  Lab Results  Component Value Date   WBC 4.3 02/15/2017   HGB 13.0 02/15/2017   HCT 36.7 02/15/2017   MCV 91.2 02/15/2017   PLT 356.0 02/15/2017   Lab Results  Component Value Date   NA 139 02/15/2017   K 3.9 02/15/2017   CL 103 02/15/2017   CO2 28 02/15/2017   Lab Results  Component Value Date   ALT 12 02/15/2017   AST 15 02/15/2017   ALKPHOS 40 02/15/2017   BILITOT 0.4 02/15/2017      RADIOGRAPHY: Ct  Chest W Contrast  Result Date: 02/20/2017 CLINICAL DATA:  Rectal cancer. EXAM: CT CHEST, ABDOMEN, AND PELVIS WITH CONTRAST TECHNIQUE: Multidetector CT imaging of the chest, abdomen and pelvis was performed following the standard protocol during bolus administration of intravenous contrast. CONTRAST:  173mL ISOVUE-300 IOPAMIDOL (ISOVUE-300) INJECTION 61% COMPARISON:  No comparison studies available. FINDINGS: CT CHEST FINDINGS Cardiovascular: The heart size is normal. No pericardial effusion. Coronary artery calcification is noted. Atherosclerotic calcification is noted in the wall of the thoracic aorta. Mediastinum/Nodes: No mediastinal lymphadenopathy. There is no hilar lymphadenopathy. There is no axillary lymphadenopathy. The esophagus has normal imaging features. Lungs/Pleura: Asymmetric airspace opacities identified in the left apex. 3 mm perifissural nodule identified along the minor fissure (image 65 series 3). No pulmonary edema or pleural effusion. Musculoskeletal: Bone windows reveal no worrisome lytic or sclerotic osseous lesions. CT ABDOMEN PELVIS FINDINGS Hepatobiliary: Multiple well-defined low-density lesions are identified in the liver of varying sizes scattered through both hepatic lobes. The largest lesion is in the inferior right liver and measures 3.5 x 3.8 cm. This largest lesion approaches water attenuation and has imaging features most suggestive of a cyst. Most of the other smaller lesions are also likely cysts. There is a 5 mm lesion in the lateral segment left liver (image 51 series 2) that appears cystic on the coronal reconstructions but close attention on follow-up is recommended. There is no evidence for gallstones, gallbladder wall thickening, or pericholecystic fluid. Pancreas: No focal mass lesion. No dilatation of the main duct. No intraparenchymal cyst. No peripancreatic edema. Spleen: No splenomegaly. No focal mass lesion. Adrenals/Urinary Tract: No adrenal nodule or mass.  Kidneys are unremarkable. No evidence for hydroureter. The urinary bladder appears normal for the degree of distention. Stomach/Bowel: Tiny hiatal hernia noted. Stomach otherwise unremarkable. Duodenum is normally positioned as is the ligament of Treitz. No small bowel wall thickening. No small bowel dilatation. The terminal ileum is normal. The appendix is not visualized, but there is no edema or inflammation in the region of the cecum. No gross colonic mass. No colonic wall thickening. No substantial diverticular change. Asymmetric wall thickening suggested in the distal rectum, potentially representing the patient's known neoplasm. Vascular/Lymphatic: There is abdominal aortic atherosclerosis without aneurysm. There is no gastrohepatic or hepatoduodenal ligament lymphadenopathy. No intraperitoneal or retroperitoneal lymphadenopathy. 1.9 x 1.6 cm right pelvic sidewall lymph  node seen on image 96 series 2. 8 mm short axis lymph node identified at the right iliac bifurcation. 0.9 x 1.3 cm lymph node identified along the left external iliac chain. Reproductive: Uterus is surgically absent. There is no adnexal mass. Other: No intraperitoneal free fluid. Musculoskeletal: Bone windows reveal no worrisome lytic or sclerotic osseous lesions. IMPRESSION: 1. Abnormal right pelvic sidewall lymph node measuring 1.9 x 1.6 cm. Imaging features highly suspicious for metastatic disease. PET-CT may prove helpful to further evaluate. Borderline lymph node identified at the right iliac bifurcation and an upper normal size lymph node is identified along the left external iliac artery chain. 2. Asymmetric wall thickening distal rectum, presumably the location of primary neoplasm. 3. Asymmetric airspace opacity in the left apex has a wedge shaped configuration on coronal imaging. This may be an area of scarring or potentially infectious/inflammatory etiology. Attention on follow-up recommended. 4. Tiny right perifissural nodule in the  right middle lobe, likely subpleural lymph node but attention on follow-up recommended. 5. Multiple low-density well-defined liver lesions, most suggestive of cysts. 6.  Aortic Atherosclerois (ICD10-170.0) Electronically Signed   By: Misty Stanley M.D.   On: 02/20/2017 10:31   Ct Abdomen Pelvis W Contrast  Result Date: 02/20/2017 CLINICAL DATA:  Rectal cancer. EXAM: CT CHEST, ABDOMEN, AND PELVIS WITH CONTRAST TECHNIQUE: Multidetector CT imaging of the chest, abdomen and pelvis was performed following the standard protocol during bolus administration of intravenous contrast. CONTRAST:  161mL ISOVUE-300 IOPAMIDOL (ISOVUE-300) INJECTION 61% COMPARISON:  No comparison studies available. FINDINGS: CT CHEST FINDINGS Cardiovascular: The heart size is normal. No pericardial effusion. Coronary artery calcification is noted. Atherosclerotic calcification is noted in the wall of the thoracic aorta. Mediastinum/Nodes: No mediastinal lymphadenopathy. There is no hilar lymphadenopathy. There is no axillary lymphadenopathy. The esophagus has normal imaging features. Lungs/Pleura: Asymmetric airspace opacities identified in the left apex. 3 mm perifissural nodule identified along the minor fissure (image 65 series 3). No pulmonary edema or pleural effusion. Musculoskeletal: Bone windows reveal no worrisome lytic or sclerotic osseous lesions. CT ABDOMEN PELVIS FINDINGS Hepatobiliary: Multiple well-defined low-density lesions are identified in the liver of varying sizes scattered through both hepatic lobes. The largest lesion is in the inferior right liver and measures 3.5 x 3.8 cm. This largest lesion approaches water attenuation and has imaging features most suggestive of a cyst. Most of the other smaller lesions are also likely cysts. There is a 5 mm lesion in the lateral segment left liver (image 51 series 2) that appears cystic on the coronal reconstructions but close attention on follow-up is recommended. There is no  evidence for gallstones, gallbladder wall thickening, or pericholecystic fluid. Pancreas: No focal mass lesion. No dilatation of the main duct. No intraparenchymal cyst. No peripancreatic edema. Spleen: No splenomegaly. No focal mass lesion. Adrenals/Urinary Tract: No adrenal nodule or mass. Kidneys are unremarkable. No evidence for hydroureter. The urinary bladder appears normal for the degree of distention. Stomach/Bowel: Tiny hiatal hernia noted. Stomach otherwise unremarkable. Duodenum is normally positioned as is the ligament of Treitz. No small bowel wall thickening. No small bowel dilatation. The terminal ileum is normal. The appendix is not visualized, but there is no edema or inflammation in the region of the cecum. No gross colonic mass. No colonic wall thickening. No substantial diverticular change. Asymmetric wall thickening suggested in the distal rectum, potentially representing the patient's known neoplasm. Vascular/Lymphatic: There is abdominal aortic atherosclerosis without aneurysm. There is no gastrohepatic or hepatoduodenal ligament lymphadenopathy. No intraperitoneal or retroperitoneal lymphadenopathy.  1.9 x 1.6 cm right pelvic sidewall lymph node seen on image 96 series 2. 8 mm short axis lymph node identified at the right iliac bifurcation. 0.9 x 1.3 cm lymph node identified along the left external iliac chain. Reproductive: Uterus is surgically absent. There is no adnexal mass. Other: No intraperitoneal free fluid. Musculoskeletal: Bone windows reveal no worrisome lytic or sclerotic osseous lesions. IMPRESSION: 1. Abnormal right pelvic sidewall lymph node measuring 1.9 x 1.6 cm. Imaging features highly suspicious for metastatic disease. PET-CT may prove helpful to further evaluate. Borderline lymph node identified at the right iliac bifurcation and an upper normal size lymph node is identified along the left external iliac artery chain. 2. Asymmetric wall thickening distal rectum, presumably  the location of primary neoplasm. 3. Asymmetric airspace opacity in the left apex has a wedge shaped configuration on coronal imaging. This may be an area of scarring or potentially infectious/inflammatory etiology. Attention on follow-up recommended. 4. Tiny right perifissural nodule in the right middle lobe, likely subpleural lymph node but attention on follow-up recommended. 5. Multiple low-density well-defined liver lesions, most suggestive of cysts. 6.  Aortic Atherosclerois (ICD10-170.0) Electronically Signed   By: Misty Stanley M.D.   On: 02/20/2017 10:31       IMPRESSION/PLAN: 1. Unstaged Squamous Cell Carcinoma of the proximal anus. Dr. Lisbeth Renshaw discusses the pathology findings and reviews the nature of squamous cell carcinoma of the anus. Based on her CT imaging, we are concerned for nodal involvement along the internal and external iliac vessels as well as the pelvic side wall. We will proceed with PET scan to evaluate this further, but would recommend concurrent chemotherapy with external radiotherapy. We discussed the risks, benefits, short, and long term effects of radiotherapy, and the patient is interested in proceeding. Dr. Lisbeth Renshaw discusses the delivery and logistics of radiotherapy, and would anticipate a course of 6 weeks of treatment. We will plan to simulate following her PET scan, and have requested this stat. We would anticipate starting treatment on 03/11/17. Written consent is obtained and placed in the chart, a copy was provided to the patient. 2. Remote history of locally advanced breast cancer. The patient has been NED, and will follow up with her PCP or Dr. Learta Codding with questions.   The above documentation reflects my direct findings during this shared patient visit. Please see the separate note by Dr. Lisbeth Renshaw on this date for the remainder of the patient's plan of care.    Carola Rhine, PAC

## 2017-02-21 ENCOUNTER — Ambulatory Visit
Admission: RE | Admit: 2017-02-21 | Discharge: 2017-02-21 | Disposition: A | Discharge status: Home / Self Care | Payer: Medicare Other | Source: Ambulatory Visit | Attending: Radiation Oncology | Admitting: Radiation Oncology

## 2017-02-21 ENCOUNTER — Telehealth: Payer: Self-pay | Admitting: *Deleted

## 2017-02-21 ENCOUNTER — Encounter: Payer: Self-pay | Admitting: Radiation Oncology

## 2017-02-21 VITALS — BP 145/81 | HR 105 | Temp 97.0°F | Resp 18 | Ht 62.0 in | Wt 132.6 lb

## 2017-02-21 DIAGNOSIS — Z803 Family history of malignant neoplasm of breast: Secondary | ICD-10-CM | POA: Diagnosis not present

## 2017-02-21 DIAGNOSIS — C20 Malignant neoplasm of rectum: Secondary | ICD-10-CM

## 2017-02-21 DIAGNOSIS — Z853 Personal history of malignant neoplasm of breast: Secondary | ICD-10-CM | POA: Diagnosis not present

## 2017-02-21 DIAGNOSIS — C210 Malignant neoplasm of anus, unspecified: Secondary | ICD-10-CM

## 2017-02-21 DIAGNOSIS — F329 Major depressive disorder, single episode, unspecified: Secondary | ICD-10-CM | POA: Insufficient documentation

## 2017-02-21 DIAGNOSIS — C44520 Squamous cell carcinoma of anal skin: Secondary | ICD-10-CM | POA: Diagnosis not present

## 2017-02-21 DIAGNOSIS — Z888 Allergy status to other drugs, medicaments and biological substances status: Secondary | ICD-10-CM | POA: Insufficient documentation

## 2017-02-21 DIAGNOSIS — Z8249 Family history of ischemic heart disease and other diseases of the circulatory system: Secondary | ICD-10-CM | POA: Diagnosis not present

## 2017-02-21 DIAGNOSIS — Z90722 Acquired absence of ovaries, bilateral: Secondary | ICD-10-CM | POA: Diagnosis not present

## 2017-02-21 DIAGNOSIS — Z90710 Acquired absence of both cervix and uterus: Secondary | ICD-10-CM | POA: Insufficient documentation

## 2017-02-21 DIAGNOSIS — C211 Malignant neoplasm of anal canal: Secondary | ICD-10-CM | POA: Diagnosis not present

## 2017-02-21 DIAGNOSIS — Z79899 Other long term (current) drug therapy: Secondary | ICD-10-CM | POA: Insufficient documentation

## 2017-02-21 DIAGNOSIS — Z9012 Acquired absence of left breast and nipple: Secondary | ICD-10-CM | POA: Diagnosis not present

## 2017-02-21 DIAGNOSIS — M1990 Unspecified osteoarthritis, unspecified site: Secondary | ICD-10-CM | POA: Insufficient documentation

## 2017-02-21 DIAGNOSIS — Z510 Encounter for antineoplastic radiation therapy: Secondary | ICD-10-CM | POA: Insufficient documentation

## 2017-02-21 DIAGNOSIS — I10 Essential (primary) hypertension: Secondary | ICD-10-CM | POA: Diagnosis not present

## 2017-02-21 DIAGNOSIS — Z923 Personal history of irradiation: Secondary | ICD-10-CM | POA: Diagnosis not present

## 2017-02-21 DIAGNOSIS — C801 Malignant (primary) neoplasm, unspecified: Secondary | ICD-10-CM

## 2017-02-21 HISTORY — DX: Malignant neoplasm of colon, unspecified: C18.9

## 2017-02-21 HISTORY — DX: Malignant neoplasm of unspecified site of unspecified female breast: C50.919

## 2017-02-21 NOTE — Progress Notes (Signed)
MyChart message to patient.  

## 2017-02-21 NOTE — Progress Notes (Signed)
Please see the Nurse Progress Note in the MD Initial Consult Encounter for this patient. 

## 2017-02-21 NOTE — Telephone Encounter (Signed)
CALLED PATIENT TO INFORM OF PET SCAN FOR 02-25-17- ARRIVAL TIME - 7 AM @ [facility], PT TO BE NPO AFTER MIDNIGHT, SPOKE WITH PATIENT AND SHE IS AWARE OF THIS TEST

## 2017-02-22 ENCOUNTER — Ambulatory Visit (HOSPITAL_BASED_OUTPATIENT_CLINIC_OR_DEPARTMENT_OTHER): Payer: Medicare Other | Admitting: Nurse Practitioner

## 2017-02-22 ENCOUNTER — Telehealth: Payer: Self-pay | Admitting: Nurse Practitioner

## 2017-02-22 VITALS — BP 139/69 | HR 92 | Temp 98.3°F | Resp 20 | Ht 62.0 in | Wt 132.9 lb

## 2017-02-22 DIAGNOSIS — Z853 Personal history of malignant neoplasm of breast: Secondary | ICD-10-CM

## 2017-02-22 DIAGNOSIS — I10 Essential (primary) hypertension: Secondary | ICD-10-CM

## 2017-02-22 DIAGNOSIS — Z803 Family history of malignant neoplasm of breast: Secondary | ICD-10-CM | POA: Diagnosis not present

## 2017-02-22 DIAGNOSIS — F329 Major depressive disorder, single episode, unspecified: Secondary | ICD-10-CM

## 2017-02-22 DIAGNOSIS — C211 Malignant neoplasm of anal canal: Secondary | ICD-10-CM

## 2017-02-22 DIAGNOSIS — C20 Malignant neoplasm of rectum: Secondary | ICD-10-CM

## 2017-02-22 MED ORDER — PROCHLORPERAZINE MALEATE 10 MG PO TABS: 10.0000 mg | ORAL_TABLET | Freq: Four times a day (QID) | ORAL | 0 refills | Status: DC | PRN

## 2017-02-22 MED ORDER — ONDANSETRON HCL 8 MG PO TABS: 8.0000 mg | ORAL_TABLET | Freq: Three times a day (TID) | ORAL | 0 refills | Status: DC | PRN

## 2017-02-22 NOTE — Telephone Encounter (Signed)
Scheduled appt per 7/13 los - Gave patient AVS and calender per los.  

## 2017-02-22 NOTE — Progress Notes (Addendum)
[place]  [phone] Fax:([phone]) [name] Note   Patient Care Team: [name], DO as PCP - General (Family Medicine) Silverio Decamp, MD as Consulting Physician (Family Medicine) 02/22/2017  CHIEF COMPLAINTS/PURPOSE OF CONSULTATION:  Squamous cell carcinoma rectum versus proximal anus  HISTORY OF PRESENTING ILLNESS:  Denise Wolfe 76 y.o. 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 She underwent a screening colonoscopy 02/11/2017. Findings included a 12-15 mm polyp in the rectum, semi-sessile. A 2 mm polyp was found in the descending colon. Pathology on the rectal polyp showed squamous cell carcinoma, basaloid type. The descending colon polyp showed tubular adenoma. CT scans of the chest/abdomen/pelvis 02/19/2017 showed an abnormal right pelvic sidewall lymph node measuring 1.9 x 1.6 cm; borderline lymph node identified at the right iliac bifurcation and an upper normal size lymph node identified along the left external iliac artery chain. There was asymmetric wall thickening at the distal rectum. There was an area of asymmetric airspace opacity in the left lung apex; a tiny right perifissural nodule in the right middle lobe, likely subpleural lymph node. Multiple low-density well-defined liver lesions were noted felt to likely be cysts.  She has been referred for a PET scan, scheduled to be done 02/25/2017. She has seen Dr. Lisbeth Renshaw, radiation oncology, with plans to begin the course of radiation on 03/11/2017.  MEDICAL HISTORY:  Past Medical History:  Diagnosis Date  . Anemia    during childhood  . Arthritis    hands, shoulder  . Breast cancer (Denise Wolfe) 03/2000   left side modified mastecomy  . Cancer [facility])    breast, age 56; left side  . Colon cancer (Denise Wolfe) 02/11/2017   rectum-squamous cell carcinoma  . Depression   . Hypertension   . Squamous cell carcinoma of  rectum (Denise Wolfe) 02/14/2017    SURGICAL HISTORY: Past Surgical History:  Procedure Laterality Date  . ABDOMINAL HYSTERECTOMY     With bilateral oophorectomy. 2002.  Marland Kitchen BREAST BIOPSY Right 2004   benign  . BREAST SURGERY Left 2001   biopsy  . CATARACT EXTRACTION Bilateral 2017  . MASTECTOMY MODIFIED RADICAL Left 2001  . MENISCUS DEBRIDEMENT Right 2003  . TUMOR EXCISION Right    thigh area    SOCIAL HISTORY: She lives in [place]. She is a widow. She has 2 children, 1 son who lives in [place] and a daughter who lives in [place]. She is a retired Museum/gallery curator. No tobacco use. Alcohol intake reported at 2 glasses of wine per day.  FAMILY HISTORY: Her mother died at age 18 of breast cancer. Father deceased of heart problems. She has no siblings.  ALLERGIES:  is allergic to ibuprofen.  MEDICATIONS:  Current Outpatient Prescriptions  Medication Sig Dispense Refill  . amLODipine (NORVASC) 5 MG tablet Take 1 tablet (5 mg total) by mouth daily. 90 tablet 3  . amphetamine-dextroamphetamine (ADDERALL) 10 MG tablet Take 1 tablet (10 mg total) by mouth 2 (two) times daily. 60 tablet 0  . atorvastatin (LIPITOR) 80 MG tablet Take 0.5 tablets (40 mg total) by mouth daily at 6 PM. 45 tablet 3  . cholecalciferol (VITAMIN D) 400 units TABS tablet Take 400 Units by mouth daily.    Marland Kitchen losartan-hydrochlorothiazide (HYZAAR) 50-12.5 MG tablet Start with one half tablet daily for 1 week, then 1 full tablet daily (Patient taking differently: Take 1 tablet by mouth daily. Start with one half tablet daily for 1  week, then 1 full tablet daily) 90 tablet 3  . sertraline (ZOLOFT) 50 MG tablet Take 1 tablet (50 mg total) by mouth daily. 90 tablet 3   Current Facility-Administered Medications  Medication Dose Route Frequency Provider Last Rate Last Dose  . 0.9 %  sodium chloride infusion  500 mL Intravenous Continuous [name], MD        REVIEW OF SYSTEMS:   Constitutional: Denies fevers,  chills or abnormal night sweats. She has a good appetite. No weight loss. Eyes: Denies blurriness of vision, double vision or watery eyes Ears, nose, mouth, throat, and face: Denies mucositis or sore throat Respiratory: Denies cough, dyspnea or wheezes Cardiovascular: Denies palpitation. She has intermittent mild chest discomfort and reports a negative "stress test" in January of this year. Gastrointestinal:  Denies nausea, heartburn or change in bowel habits. No rectal pain. Over the past few months she has noted "sporadic" mild rectal bleeding similar to hemorrhoidal bleeding she has had in the past. Skin: Denies abnormal skin rashes Lymphatics: Denies new lymphadenopathy or easy bruising Neurological:Denies numbness, tingling or new weaknesses Behavioral/Psych: Mood is stable, no new changes  All other systems were reviewed with the patient and are negative.  PHYSICAL EXAMINATION: ECOG PERFORMANCE STATUS: 0  Vitals:   02/22/17 1001  BP: 139/69  Pulse: 92  Resp: 20  Temp: 98.3 F (36.8 C)   Filed Weights   02/22/17 1001  Weight: 132 lb 14.4 oz (60.3 kg)    GENERAL:alert, no distress and comfortable SKIN: skin color, texture, turgor are normal, no rashes or significant lesions EYES: normal, conjunctiva are pink and non-injected, sclera clear OROPHARYNX:no exudate, no erythema and lips, buccal mucosa, and tongue normal  NECK: supple, thyroid normal size, non-tender, without nodularity LYMPH:  no palpable lymphadenopathy in the cervical, axillary or inguinal BREAST: Status post left mastectomy. No evidence of chest wall tumor recurrence. No mass palpated in the right breast. LUNGS: clear to auscultation and percussion with normal breathing effort HEART: regular rate & rhythm and no murmurs and no lower extremity edema ABDOMEN:abdomen soft, non-tender and normal bowel sounds. No hepatosplenomegaly. Musculoskeletal:no cyanosis of digits and no clubbing  PSYCH: alert & oriented x 3  with fluent speech NEURO: no focal motor/sensory deficits RECTAL: rectal exam showed a 1.5cm low anterior rectal mass, firm, non-bleeding    LABORATORY DATA:  I have reviewed the data as listed CBC Latest Ref Rng & Units 02/15/2017 07/11/2016  WBC 4.0 - 10.5 K/uL 4.3 4.6  Hemoglobin 12.0 - 15.0 g/dL 13.0 14.8  Hematocrit 36.0 - 46.0 % 36.7 42.7  Platelets 150.0 - 400.0 K/uL 356.0 348  02/15/2017 CEA 1.8   RADIOGRAPHIC STUDIES: I have personally reviewed the radiological images as listed and agreed with the findings in the report. Ct Chest W Contrast  Result Date: 02/20/2017 CLINICAL DATA:  Rectal cancer. EXAM: CT CHEST, ABDOMEN, AND PELVIS WITH CONTRAST TECHNIQUE: Multidetector CT imaging of the chest, abdomen and pelvis was performed following the standard protocol during bolus administration of intravenous contrast. CONTRAST:  173mL ISOVUE-300 IOPAMIDOL (ISOVUE-300) INJECTION 61% COMPARISON:  No comparison studies available. FINDINGS: CT CHEST FINDINGS Cardiovascular: The heart size is normal. No pericardial effusion. Coronary artery calcification is noted. Atherosclerotic calcification is noted in the wall of the thoracic aorta. Mediastinum/Nodes: No mediastinal lymphadenopathy. There is no hilar lymphadenopathy. There is no axillary lymphadenopathy. The esophagus has normal imaging features. Lungs/Pleura: Asymmetric airspace opacities identified in the left apex. 3 mm perifissural nodule identified along the minor fissure (  image 65 series 3). No pulmonary edema or pleural effusion. Musculoskeletal: Bone windows reveal no worrisome lytic or sclerotic osseous lesions. CT ABDOMEN PELVIS FINDINGS Hepatobiliary: Multiple well-defined low-density lesions are identified in the liver of varying sizes scattered through both hepatic lobes. The largest lesion is in the inferior right liver and measures 3.5 x 3.8 cm. This largest lesion approaches water attenuation and has imaging features most suggestive of  a cyst. Most of the other smaller lesions are also likely cysts. There is a 5 mm lesion in the lateral segment left liver (image 51 series 2) that appears cystic on the coronal reconstructions but close attention on follow-up is recommended. There is no evidence for gallstones, gallbladder wall thickening, or pericholecystic fluid. Pancreas: No focal mass lesion. No dilatation of the main duct. No intraparenchymal cyst. No peripancreatic edema. Spleen: No splenomegaly. No focal mass lesion. Adrenals/Urinary Tract: No adrenal nodule or mass. Kidneys are unremarkable. No evidence for hydroureter. The urinary bladder appears normal for the degree of distention. Stomach/Bowel: Tiny hiatal hernia noted. Stomach otherwise unremarkable. Duodenum is normally positioned as is the ligament of Treitz. No small bowel wall thickening. No small bowel dilatation. The terminal ileum is normal. The appendix is not visualized, but there is no edema or inflammation in the region of the cecum. No gross colonic mass. No colonic wall thickening. No substantial diverticular change. Asymmetric wall thickening suggested in the distal rectum, potentially representing the patient's known neoplasm. Vascular/Lymphatic: There is abdominal aortic atherosclerosis without aneurysm. There is no gastrohepatic or hepatoduodenal ligament lymphadenopathy. No intraperitoneal or retroperitoneal lymphadenopathy. 1.9 x 1.6 cm right pelvic sidewall lymph node seen on image 96 series 2. 8 mm short axis lymph node identified at the right iliac bifurcation. 0.9 x 1.3 cm lymph node identified along the left external iliac chain. Reproductive: Uterus is surgically absent. There is no adnexal mass. Other: No intraperitoneal free fluid. Musculoskeletal: Bone windows reveal no worrisome lytic or sclerotic osseous lesions. IMPRESSION: 1. Abnormal right pelvic sidewall lymph node measuring 1.9 x 1.6 cm. Imaging features highly suspicious for metastatic disease. PET-CT  may prove helpful to further evaluate. Borderline lymph node identified at the right iliac bifurcation and an upper normal size lymph node is identified along the left external iliac artery chain. 2. Asymmetric wall thickening distal rectum, presumably the location of primary neoplasm. 3. Asymmetric airspace opacity in the left apex has a wedge shaped configuration on coronal imaging. This may be an area of scarring or potentially infectious/inflammatory etiology. Attention on follow-up recommended. 4. Tiny right perifissural nodule in the right middle lobe, likely subpleural lymph node but attention on follow-up recommended. 5. Multiple low-density well-defined liver lesions, most suggestive of cysts. 6.  Aortic Atherosclerois (ICD10-170.0) Electronically Signed   By: Misty Stanley M.D.   On: 02/20/2017 10:31   Ct Abdomen Pelvis W Contrast  Result Date: 02/20/2017 CLINICAL DATA:  Rectal cancer. EXAM: CT CHEST, ABDOMEN, AND PELVIS WITH CONTRAST TECHNIQUE: Multidetector CT imaging of the chest, abdomen and pelvis was performed following the standard protocol during bolus administration of intravenous contrast. CONTRAST:  136mL ISOVUE-300 IOPAMIDOL (ISOVUE-300) INJECTION 61% COMPARISON:  No comparison studies available. FINDINGS: CT CHEST FINDINGS Cardiovascular: The heart size is normal. No pericardial effusion. Coronary artery calcification is noted. Atherosclerotic calcification is noted in the wall of the thoracic aorta. Mediastinum/Nodes: No mediastinal lymphadenopathy. There is no hilar lymphadenopathy. There is no axillary lymphadenopathy. The esophagus has normal imaging features. Lungs/Pleura: Asymmetric airspace opacities identified in the left apex. 3  mm perifissural nodule identified along the minor fissure (image 65 series 3). No pulmonary edema or pleural effusion. Musculoskeletal: Bone windows reveal no worrisome lytic or sclerotic osseous lesions. CT ABDOMEN PELVIS FINDINGS Hepatobiliary: Multiple  well-defined low-density lesions are identified in the liver of varying sizes scattered through both hepatic lobes. The largest lesion is in the inferior right liver and measures 3.5 x 3.8 cm. This largest lesion approaches water attenuation and has imaging features most suggestive of a cyst. Most of the other smaller lesions are also likely cysts. There is a 5 mm lesion in the lateral segment left liver (image 51 series 2) that appears cystic on the coronal reconstructions but close attention on follow-up is recommended. There is no evidence for gallstones, gallbladder wall thickening, or pericholecystic fluid. Pancreas: No focal mass lesion. No dilatation of the main duct. No intraparenchymal cyst. No peripancreatic edema. Spleen: No splenomegaly. No focal mass lesion. Adrenals/Urinary Tract: No adrenal nodule or mass. Kidneys are unremarkable. No evidence for hydroureter. The urinary bladder appears normal for the degree of distention. Stomach/Bowel: Tiny hiatal hernia noted. Stomach otherwise unremarkable. Duodenum is normally positioned as is the ligament of Treitz. No small bowel wall thickening. No small bowel dilatation. The terminal ileum is normal. The appendix is not visualized, but there is no edema or inflammation in the region of the cecum. No gross colonic mass. No colonic wall thickening. No substantial diverticular change. Asymmetric wall thickening suggested in the distal rectum, potentially representing the patient's known neoplasm. Vascular/Lymphatic: There is abdominal aortic atherosclerosis without aneurysm. There is no gastrohepatic or hepatoduodenal ligament lymphadenopathy. No intraperitoneal or retroperitoneal lymphadenopathy. 1.9 x 1.6 cm right pelvic sidewall lymph node seen on image 96 series 2. 8 mm short axis lymph node identified at the right iliac bifurcation. 0.9 x 1.3 cm lymph node identified along the left external iliac chain. Reproductive: Uterus is surgically absent. There is  no adnexal mass. Other: No intraperitoneal free fluid. Musculoskeletal: Bone windows reveal no worrisome lytic or sclerotic osseous lesions. IMPRESSION: 1. Abnormal right pelvic sidewall lymph node measuring 1.9 x 1.6 cm. Imaging features highly suspicious for metastatic disease. PET-CT may prove helpful to further evaluate. Borderline lymph node identified at the right iliac bifurcation and an upper normal size lymph node is identified along the left external iliac artery chain. 2. Asymmetric wall thickening distal rectum, presumably the location of primary neoplasm. 3. Asymmetric airspace opacity in the left apex has a wedge shaped configuration on coronal imaging. This may be an area of scarring or potentially infectious/inflammatory etiology. Attention on follow-up recommended. 4. Tiny right perifissural nodule in the right middle lobe, likely subpleural lymph node but attention on follow-up recommended. 5. Multiple low-density well-defined liver lesions, most suggestive of cysts. 6.  Aortic Atherosclerois (ICD10-170.0) Electronically Signed   By: Misty Stanley M.D.   On: 02/20/2017 10:31    ASSESSMENT & PLAN:  1. Squamous cell carcinoma of the rectum versus proximal anus.   Colonoscopy 02/11/2017 showed a 12-15 mm polyp in the rectum, semi-sessile. A 2 mm polyp was found in the descending colon. Pathology on the rectal polyp showed squamous cell carcinoma, basaloid type. The descending colon polyp pathology showed tubular adenoma.   CT scans of the chest/abdomen/pelvis 02/19/2017 showed an abnormal right pelvic sidewall lymph node measuring 1.9 x 1.6 cm; borderline lymph node identified at the right iliac bifurcation and an upper normal size lymph node identified along the left external iliac artery chain. There was asymmetric wall thickening at the  distal rectum. There was an area of asymmetric airspace opacity in the left lung apex; a tiny right perifissural nodule in the right middle lobe, likely  subpleural lymph node. Multiple low-density well-defined liver lesions were noted felt to likely be cysts.  PET scan scheduled 02/25/2017  Anticipated radiation start date 03/11/2017 2. History of left breast cancer 2001 diagnosed and treated in [place] status post mastectomy with patient report of 2 of 17 positive lymph nodes. She completed chemotherapy and radiation. 3. Hypertension currently on Norvasc and Hyzaar. 4. Depression currently on Zoloft. 5. Hypercholesterolemia on Lipitor. 6. Intermittent chest discomfort status post nuclear stress test 08/27/2016-normal study. 7. Personal and family history of breast cancer. Mother diagnosed at age 32. Declines referral to genetics counselor.  Ms. Borchard has been diagnosed with squamous cell carcinoma of the rectum versus proximal anus. She is scheduled for a staging PET scan next week. She has seen Dr. Lisbeth Renshaw with plans to begin radiation 03/11/2017. Dr. Burr Medico recommends concurrent chemotherapy with mitomycin C and 5-fluorouracil. We reviewed potential toxicities including bone marrow toxicity, nausea, mouth sores, diarrhea, hair loss. We discussed the hemolytic uremic syndrome associated with mitomycin. We discussed potential toxicities associated with 5-fluorouracil including skin rash, hand-foot syndrome, increased sensitivity to sun, conjunctivitis. She is agreeable to proceed. She will attend a chemotherapy education class.  She will be scheduled for placement of a PICC line on 03/11/2017 to be followed by labs and an appointment with Dr. Burr Medico prior to proceeding with the first cycle of mitomycin C/5 fluorouracil. She will contact the office in the interim with any problems.  Patient seen with Dr. Burr Medico.     Ned Card, NP 02/22/2017 10:21 AM  Addendum I have seen the patient, examined her. I agree with the assessment and and plan and have edited the notes.   76 year old female with past medical history of left breast cancer in [place]  1, treated with mastectomy and axillary lymph node dissection, adjuvant chemotherapy, radiation and tamoxifen, HTN, depression and arthritis, presented with screening colonoscopy discovered low rectal mass, biopsy confirmed squamous cell carcinoma. CT scan showed a possible pelvic node metastasis, no distant metastasis. We recommend staging PET scan, if no distant metastasis, she would be a candidate for concurrent chemotherapy and radiation with 5-FU and mitomycin regimen for definitive treatment. We discussed the cure rate of chemoradiation for rectal squamous cell carcinoma (similar to anal cancer) is very high, and surgery is only reserved for residual or recurrent disease. Potential benefits and side effects were discussed with patient, chemotherapy consent obtained, we'll likely start treatment in a few weeks. I plan to see her back on her first day of treatment.  Truitt Merle  03/12/2017

## 2017-02-25 ENCOUNTER — Ambulatory Visit (HOSPITAL_COMMUNITY)
Admission: RE | Admit: 2017-02-25 | Discharge: 2017-02-25 | Disposition: A | Discharge status: Home / Self Care | Payer: Medicare Other | Source: Ambulatory Visit | Attending: Radiation Oncology | Admitting: Radiation Oncology

## 2017-02-25 DIAGNOSIS — C210 Malignant neoplasm of anus, unspecified: Secondary | ICD-10-CM | POA: Insufficient documentation

## 2017-02-25 LAB — GLUCOSE, CAPILLARY: Glucose-Capillary: 99 mg/dL (ref 65–99)

## 2017-02-25 MED ORDER — FLUDEOXYGLUCOSE F - 18 (FDG) INJECTION
6.5600 | Freq: Once | INTRAVENOUS | Status: AC | PRN
  Administered 2017-02-25: 6.56 via INTRAVENOUS

## 2017-02-26 ENCOUNTER — Other Ambulatory Visit: Payer: Medicare Other

## 2017-02-27 ENCOUNTER — Telehealth: Payer: Self-pay | Admitting: Radiation Oncology

## 2017-02-27 NOTE — Telephone Encounter (Signed)
I called the patient to discuss her PET scan results. Her stage is a IIA, [id number] squamous cell of the anus with involvement of bilateral external iliac nodes. She is scheduled for simulation on 7/25 and for chemo and radiation start on 7/30. She will call if she has questions or concerns prior to these visits.

## 2017-03-05 DIAGNOSIS — C211 Malignant neoplasm of anal canal: Secondary | ICD-10-CM | POA: Diagnosis not present

## 2017-03-06 ENCOUNTER — Ambulatory Visit
Admission: RE | Admit: 2017-03-06 | Discharge: 2017-03-06 | Disposition: A | Discharge status: Home / Self Care | Payer: Medicare Other | Source: Ambulatory Visit | Attending: Radiation Oncology | Admitting: Radiation Oncology

## 2017-03-06 DIAGNOSIS — Z853 Personal history of malignant neoplasm of breast: Secondary | ICD-10-CM | POA: Diagnosis not present

## 2017-03-06 DIAGNOSIS — I10 Essential (primary) hypertension: Secondary | ICD-10-CM | POA: Diagnosis not present

## 2017-03-06 DIAGNOSIS — Z510 Encounter for antineoplastic radiation therapy: Secondary | ICD-10-CM | POA: Diagnosis not present

## 2017-03-06 DIAGNOSIS — C218 Malignant neoplasm of overlapping sites of rectum, anus and anal canal: Secondary | ICD-10-CM | POA: Diagnosis not present

## 2017-03-06 DIAGNOSIS — C211 Malignant neoplasm of anal canal: Secondary | ICD-10-CM | POA: Diagnosis not present

## 2017-03-06 DIAGNOSIS — Z923 Personal history of irradiation: Secondary | ICD-10-CM | POA: Diagnosis not present

## 2017-03-06 DIAGNOSIS — F329 Major depressive disorder, single episode, unspecified: Secondary | ICD-10-CM | POA: Diagnosis not present

## 2017-03-07 ENCOUNTER — Other Ambulatory Visit: Payer: Self-pay | Admitting: Family Medicine

## 2017-03-07 DIAGNOSIS — Z510 Encounter for antineoplastic radiation therapy: Secondary | ICD-10-CM | POA: Diagnosis not present

## 2017-03-07 DIAGNOSIS — Z923 Personal history of irradiation: Secondary | ICD-10-CM | POA: Diagnosis not present

## 2017-03-07 DIAGNOSIS — C211 Malignant neoplasm of anal canal: Secondary | ICD-10-CM | POA: Diagnosis not present

## 2017-03-07 DIAGNOSIS — Z853 Personal history of malignant neoplasm of breast: Secondary | ICD-10-CM | POA: Diagnosis not present

## 2017-03-07 DIAGNOSIS — I10 Essential (primary) hypertension: Secondary | ICD-10-CM | POA: Diagnosis not present

## 2017-03-07 DIAGNOSIS — C218 Malignant neoplasm of overlapping sites of rectum, anus and anal canal: Secondary | ICD-10-CM | POA: Diagnosis not present

## 2017-03-07 DIAGNOSIS — F329 Major depressive disorder, single episode, unspecified: Secondary | ICD-10-CM | POA: Diagnosis not present

## 2017-03-07 MED ORDER — AMPHETAMINE-DEXTROAMPHETAMINE 10 MG PO TABS: 10.0000 mg | ORAL_TABLET | Freq: Two times a day (BID) | ORAL | 0 refills | Status: DC

## 2017-03-07 NOTE — Telephone Encounter (Signed)
Adderall refilled.  Patient to make CPE appointment.

## 2017-03-07 NOTE — Progress Notes (Signed)
  Radiation Oncology         [phone] ________________________________  Name: Denise Wolfe MRN: [id number]  Date: 03/06/2017  DOB: 13-Apr-1941  SIMULATION AND TREATMENT PLANNING NOTE   DIAGNOSIS:     ICD-10-CM   1. Cancer of anal canal ([place]) C21.1      CONSENT VERIFIED: yes   SET UP: Patient is set-up supine   IMMOBILIZATION: The following immobilization is used: Customized VAC lock bag. This complex treatment device will be used on a daily basis during the patient's treatment.   Diagnosis: Anal cancer   NARRATIVE: The patient was brought to the [facility]. Identity was confirmed. All relevant records and images related to the planned course of therapy were reviewed. Then, the patient was positioned in a stable reproducible clinical set-up for radiation therapy using a customized vac lock bag. Skin markings were placed. The CT images were loaded into the planning software where the target and avoidance structures were contoured.The radiation prescription was entered and confirmed.   The patient will receive 54 Gy in 30 fractions to the high-dose target region.  Daily image guidance is ordered, and this will be used on a daily basis. This is necessary to ensure accurate and precise localization of the target in addition to accurate alignment of the normal tissue structures in this region. This is particularly important given the possible motion of the high-dose target.  Treatment planning then occurred.   I have requested : Intensity Modulated Radiotherapy (IMRT) is medically necessary for this case for the following reason: Dose homogeneity; the target is in close proximity to critical normal structures, including the femoral heads, bladder, and small bowel. IMRT is thus medically to appropriately treat the patient.   Special treatment procedure  The patient will receive chemotherapy during the course of radiation treatment. The patient may experience increased  or overlapping toxicity due to this combined-modality approach and the patient will be monitored for such problems. This may include extra lab  work as necessary. This therefore constitutes a special treatment procedure.     ________________________________  Jodelle Gross, MD, PhD

## 2017-03-11 ENCOUNTER — Encounter (HOSPITAL_COMMUNITY): Payer: Self-pay | Admitting: Radiology

## 2017-03-11 ENCOUNTER — Ambulatory Visit: Payer: Medicare Other

## 2017-03-11 ENCOUNTER — Ambulatory Visit (HOSPITAL_BASED_OUTPATIENT_CLINIC_OR_DEPARTMENT_OTHER): Payer: Medicare Other

## 2017-03-11 ENCOUNTER — Other Ambulatory Visit (HOSPITAL_BASED_OUTPATIENT_CLINIC_OR_DEPARTMENT_OTHER): Payer: Medicare Other

## 2017-03-11 ENCOUNTER — Other Ambulatory Visit: Payer: Self-pay | Admitting: Nurse Practitioner

## 2017-03-11 ENCOUNTER — Ambulatory Visit (HOSPITAL_BASED_OUTPATIENT_CLINIC_OR_DEPARTMENT_OTHER): Payer: Medicare Other | Admitting: Hematology

## 2017-03-11 ENCOUNTER — Ambulatory Visit (HOSPITAL_COMMUNITY)
Admission: RE | Admit: 2017-03-11 | Discharge: 2017-03-11 | Disposition: A | Discharge status: Home / Self Care | Payer: Medicare Other | Source: Ambulatory Visit | Attending: Nurse Practitioner | Admitting: Nurse Practitioner

## 2017-03-11 ENCOUNTER — Ambulatory Visit
Admission: RE | Admit: 2017-03-11 | Discharge: 2017-03-11 | Disposition: A | Discharge status: Home / Self Care | Payer: Medicare Other | Source: Ambulatory Visit | Attending: Radiation Oncology | Admitting: Radiation Oncology

## 2017-03-11 VITALS — BP 178/71 | HR 81 | Temp 97.9°F | Resp 18 | Ht 62.0 in | Wt 132.3 lb

## 2017-03-11 DIAGNOSIS — C210 Malignant neoplasm of anus, unspecified: Secondary | ICD-10-CM | POA: Insufficient documentation

## 2017-03-11 DIAGNOSIS — C20 Malignant neoplasm of rectum: Secondary | ICD-10-CM | POA: Diagnosis not present

## 2017-03-11 DIAGNOSIS — C211 Malignant neoplasm of anal canal: Secondary | ICD-10-CM

## 2017-03-11 DIAGNOSIS — C801 Malignant (primary) neoplasm, unspecified: Secondary | ICD-10-CM

## 2017-03-11 DIAGNOSIS — I10 Essential (primary) hypertension: Secondary | ICD-10-CM

## 2017-03-11 DIAGNOSIS — Z853 Personal history of malignant neoplasm of breast: Secondary | ICD-10-CM

## 2017-03-11 DIAGNOSIS — F329 Major depressive disorder, single episode, unspecified: Secondary | ICD-10-CM | POA: Diagnosis not present

## 2017-03-11 DIAGNOSIS — Z5111 Encounter for antineoplastic chemotherapy: Secondary | ICD-10-CM | POA: Diagnosis present

## 2017-03-11 DIAGNOSIS — Z95828 Presence of other vascular implants and grafts: Secondary | ICD-10-CM

## 2017-03-11 DIAGNOSIS — C218 Malignant neoplasm of overlapping sites of rectum, anus and anal canal: Secondary | ICD-10-CM | POA: Diagnosis not present

## 2017-03-11 DIAGNOSIS — E559 Vitamin D deficiency, unspecified: Secondary | ICD-10-CM

## 2017-03-11 DIAGNOSIS — C44520 Squamous cell carcinoma of anal skin: Secondary | ICD-10-CM | POA: Diagnosis not present

## 2017-03-11 DIAGNOSIS — Z510 Encounter for antineoplastic radiation therapy: Secondary | ICD-10-CM | POA: Diagnosis not present

## 2017-03-11 DIAGNOSIS — Z923 Personal history of irradiation: Secondary | ICD-10-CM | POA: Diagnosis not present

## 2017-03-11 HISTORY — PX: IR US GUIDE VASC ACCESS RIGHT: IMG2390

## 2017-03-11 HISTORY — PX: IR FLUORO GUIDE CV LINE RIGHT: IMG2283

## 2017-03-11 LAB — CBC WITH DIFFERENTIAL/PLATELET
BASO%: 0.2 % (ref 0.0–2.0)
Basophils Absolute: 0 10*3/uL (ref 0.0–0.1)
EOS%: 3.9 % (ref 0.0–7.0)
Eosinophils Absolute: 0.2 10*3/uL (ref 0.0–0.5)
HCT: 36.8 % (ref 34.8–46.6)
HGB: 12.7 g/dL (ref 11.6–15.9)
LYMPH%: 37.5 % (ref 14.0–49.7)
MCH: 31.8 pg (ref 25.1–34.0)
MCHC: 34.4 g/dL (ref 31.5–36.0)
MCV: 92.4 fL (ref 79.5–101.0)
MONO#: 0.5 10*3/uL (ref 0.1–0.9)
MONO%: 11 % (ref 0.0–14.0)
NEUT%: 47.4 % (ref 38.4–76.8)
NEUTROS ABS: 2.1 10*3/uL (ref 1.5–6.5)
PLATELETS: 375 10*3/uL (ref 145–400)
RBC: 3.98 10*6/uL (ref 3.70–5.45)
RDW: 13.8 % (ref 11.2–14.5)
WBC: 4.4 10*3/uL (ref 3.9–10.3)
lymph#: 1.6 10*3/uL (ref 0.9–3.3)

## 2017-03-11 LAB — COMPREHENSIVE METABOLIC PANEL
ALT: 16 U/L (ref 0–55)
ANION GAP: 8 meq/L (ref 3–11)
AST: 21 U/L (ref 5–34)
Albumin: 4.2 g/dL (ref 3.5–5.0)
Alkaline Phosphatase: 42 U/L (ref 40–150)
BILIRUBIN TOTAL: 0.63 mg/dL (ref 0.20–1.20)
BUN: 16.3 mg/dL (ref 7.0–26.0)
CHLORIDE: 106 meq/L (ref 98–109)
CO2: 27 meq/L (ref 22–29)
CREATININE: 0.7 mg/dL (ref 0.6–1.1)
Calcium: 10.2 mg/dL (ref 8.4–10.4)
EGFR: 86 mL/min/{1.73_m2} — ABNORMAL LOW (ref >90)
GLUCOSE: 90 mg/dL (ref 70–140)
Potassium: 3.8 mEq/L (ref 3.5–5.1)
SODIUM: 141 meq/L (ref 136–145)
TOTAL PROTEIN: 6.9 g/dL (ref 6.4–8.3)

## 2017-03-11 MED ORDER — PROCHLORPERAZINE MALEATE 10 MG PO TABS
10.0000 mg | ORAL_TABLET | Freq: Once | ORAL | Status: AC
  Administered 2017-03-11: 10 mg via ORAL

## 2017-03-11 MED ORDER — SODIUM CHLORIDE 0.9 % IV SOLN
Freq: Once | INTRAVENOUS | Status: AC
  Administered 2017-03-11: 16:00:00 via INTRAVENOUS

## 2017-03-11 MED ORDER — LIDOCAINE HCL 1 % IJ SOLN
INTRAMUSCULAR | Status: AC
  Filled 2017-03-11: qty 20

## 2017-03-11 MED ORDER — PROCHLORPERAZINE MALEATE 10 MG PO TABS
ORAL_TABLET | ORAL | Status: AC
  Filled 2017-03-11: qty 1

## 2017-03-11 MED ORDER — SODIUM CHLORIDE 0.9% FLUSH
10.0000 mL | INTRAVENOUS | Status: DC | PRN
  Administered 2017-03-11: 10 mL via INTRAVENOUS
  Filled 2017-03-11: qty 10

## 2017-03-11 MED ORDER — FLUOROURACIL CHEMO INJECTION 5 GM/100ML
1000.0000 mg/m2/d | INTRAVENOUS | Status: DC
  Administered 2017-03-11: 6500 mg via INTRAVENOUS
  Filled 2017-03-11: qty 130

## 2017-03-11 MED ORDER — MITOMYCIN CHEMO IV INJECTION 20 MG
10.0000 mg/m2 | Freq: Once | INTRAVENOUS | Status: AC
  Administered 2017-03-11: 16 mg via INTRAVENOUS
  Filled 2017-03-11: qty 32

## 2017-03-11 NOTE — Progress Notes (Signed)
1800

## 2017-03-11 NOTE — Progress Notes (Signed)
Patient tolerated mitomycin push and 57fu infusion well. No reports of adverse reactions. Patient sat for 48mins post pump initiation to ensure that medication was tolerated well. Discharge instructions provided. Patient and daughter voiced understanding.  Wylene Simmer, BSN, RN 03/11/2017 6:16 PM

## 2017-03-11 NOTE — Patient Instructions (Signed)
[facility] Discharge Instructions for Patients Receiving Chemotherapy  Today you received the following chemotherapy agents: Mitomycin, 28fu  To help prevent nausea and vomiting after your treatment, we encourage you to take your nausea medication as prescribed.   If you develop nausea and vomiting that is not controlled by your nausea medication, call the clinic.   BELOW ARE SYMPTOMS THAT SHOULD BE REPORTED IMMEDIATELY:  *FEVER GREATER THAN 100.5 F  *CHILLS WITH OR WITHOUT FEVER  NAUSEA AND VOMITING THAT IS NOT CONTROLLED WITH YOUR NAUSEA MEDICATION  *UNUSUAL SHORTNESS OF BREATH  *UNUSUAL BRUISING OR BLEEDING  TENDERNESS IN MOUTH AND THROAT WITH OR WITHOUT PRESENCE OF ULCERS  *URINARY PROBLEMS  *BOWEL PROBLEMS  UNUSUAL RASH Items with * indicate a potential emergency and should be followed up as soon as possible.  Feel free to call the clinic you have any questions or concerns. The clinic phone number is [phone].  Please show the [facility] at check-in to the Emergency Department and triage nurse.  Mitomycin injection What is this medicine? MITOMYCIN (mye toe MYE sin) is a chemotherapy drug. This medicine is used to treat cancer of the stomach and pancreas. This medicine may be used for other purposes; ask your health care provider or pharmacist if you have questions. COMMON BRAND NAME(S): Mutamycin What should I tell my health care provider before I take this medicine? They need to know if you have any of these conditions: -anemia -bleeding disorder -infection (especially a virus infection such as chickenpox, cold sores, or herpes) -kidney disease -low blood counts like low platelets, red blood cells, white blood cells -recent radiation therapy -an unusual or allergic reaction to mitomycin, other chemotherapy agents, other medicines, foods, dyes, or preservatives -pregnant or trying to get pregnant -breast-feeding How should I use this  medicine? This drug is given as an injection or infusion into a vein. It is administered in a hospital or clinic by a specially trained health care professional. Talk to your pediatrician regarding the use of this medicine in children. Special care may be needed. Overdosage: If you think you have taken too much of this medicine contact a poison control center or emergency room at once. NOTE: This medicine is only for you. Do not share this medicine with others. What if I miss a dose? It is important not to miss your dose. Call your doctor or health care professional if you are unable to keep an appointment. What may interact with this medicine? -medicines to increase blood counts like filgrastim, pegfilgrastim, sargramostim -vaccines This list may not describe all possible interactions. Give your health care provider a list of all the medicines, herbs, non-prescription drugs, or dietary supplements you use. Also tell them if you smoke, drink alcohol, or use illegal drugs. Some items may interact with your medicine. What should I watch for while using this medicine? Your condition will be monitored carefully while you are receiving this medicine. You will need important blood work done while you are taking this medicine. This drug may make you feel generally unwell. This is not uncommon, as chemotherapy can affect healthy cells as well as cancer cells. Report any side effects. Continue your course of treatment even though you feel ill unless your doctor tells you to stop. Call your doctor or health care professional for advice if you get a fever, chills or sore throat, or other symptoms of a cold or flu. Do not treat yourself. This drug decreases your body's ability to fight  infections. Try to avoid being around people who are sick. This medicine may increase your risk to bruise or bleed. Call your doctor or health care professional if you notice any unusual bleeding. Be careful brushing and flossing  your teeth or using a toothpick because you may get an infection or bleed more easily. If you have any dental work done, tell your dentist you are receiving this medicine. Avoid taking products that contain aspirin, acetaminophen, ibuprofen, naproxen, or ketoprofen unless instructed by your doctor. These medicines may hide a fever. Do not become pregnant while taking this medicine. Women should inform their doctor if they wish to become pregnant or think they might be pregnant. There is a potential for serious side effects to an unborn child. Talk to your health care professional or pharmacist for more information. Do not breast-feed an infant while taking this medicine. What side effects may I notice from receiving this medicine? Side effects that you should report to your doctor or health care professional as soon as possible: -allergic reactions like skin rash, itching or hives, swelling of the face, lips, or tongue -low blood counts - this medicine may decrease the number of white blood cells, red blood cells and platelets. You may be at increased risk for infections and bleeding. -signs of infection - fever or chills, cough, sore throat, pain or difficulty passing urine -signs of decreased platelets or bleeding - bruising, pinpoint red spots on the skin, black, tarry stools, blood in the urine -signs of decreased red blood cells - unusually weak or tired, fainting spells, lightheadedness -breathing problems -changes in vision -chest pain -confusion -dry cough -high blood pressure -mouth sores -pain, swelling, redness at site where injected -pain, tingling, numbness in the hands or feet -seizures -swelling of the ankles, feet, hands -trouble passing urine or change in the amount of urine Side effects that usually do not require medical attention (report to your doctor or health care professional if they continue or are bothersome): -diarrhea -green to blue color of urine -hair  loss -loss of appetite -nausea, vomiting This list may not describe all possible side effects. Call your doctor for medical advice about side effects. You may report side effects to FDA at 1-800-FDA-1088. Where should I keep my medicine? This drug is given in a hospital or clinic and will not be stored at home. NOTE: This sheet is a summary. It may not cover all possible information. If you have questions about this medicine, talk to your doctor, pharmacist, or health care provider.  2018 [employer] (2008-02-05 11:16:23) Fluorouracil, 5-FU injection What is this medicine? FLUOROURACIL, 5-FU (flure oh YOOR a sil) is a chemotherapy drug. It slows the growth of cancer cells. This medicine is used to treat many types of cancer like breast cancer, colon or rectal cancer, pancreatic cancer, and stomach cancer. This medicine may be used for other purposes; ask your health care provider or pharmacist if you have questions. COMMON BRAND NAME(S): Adrucil What should I tell my health care provider before I take this medicine? They need to know if you have any of these conditions: -blood disorders -dihydropyrimidine dehydrogenase (DPD) deficiency -infection (especially a virus infection such as chickenpox, cold sores, or herpes) -kidney disease -liver disease -malnourished, poor nutrition -recent or ongoing radiation therapy -an unusual or allergic reaction to fluorouracil, other chemotherapy, other medicines, foods, dyes, or preservatives -pregnant or trying to get pregnant -breast-feeding How should I use this medicine? This drug is given as an infusion or injection  into a vein. It is administered in a hospital or clinic by a specially trained health care professional. Talk to your pediatrician regarding the use of this medicine in children. Special care may be needed. Overdosage: If you think you have taken too much of this medicine contact a poison control center or emergency room at  once. NOTE: This medicine is only for you. Do not share this medicine with others. What if I miss a dose? It is important not to miss your dose. Call your doctor or health care professional if you are unable to keep an appointment. What may interact with this medicine? -allopurinol -cimetidine -dapsone -digoxin -hydroxyurea -leucovorin -levamisole -medicines for seizures like ethotoin, fosphenytoin, phenytoin -medicines to increase blood counts like filgrastim, pegfilgrastim, sargramostim -medicines that treat or prevent blood clots like warfarin, enoxaparin, and dalteparin -methotrexate -metronidazole -pyrimethamine -some other chemotherapy drugs like busulfan, cisplatin, estramustine, vinblastine -trimethoprim -trimetrexate -vaccines Talk to your doctor or health care professional before taking any of these medicines: -acetaminophen -aspirin -ibuprofen -ketoprofen -naproxen This list may not describe all possible interactions. Give your health care provider a list of all the medicines, herbs, non-prescription drugs, or dietary supplements you use. Also tell them if you smoke, drink alcohol, or use illegal drugs. Some items may interact with your medicine. What should I watch for while using this medicine? Visit your doctor for checks on your progress. This drug may make you feel generally unwell. This is not uncommon, as chemotherapy can affect healthy cells as well as cancer cells. Report any side effects. Continue your course of treatment even though you feel ill unless your doctor tells you to stop. In some cases, you may be given additional medicines to help with side effects. Follow all directions for their use. Call your doctor or health care professional for advice if you get a fever, chills or sore throat, or other symptoms of a cold or flu. Do not treat yourself. This drug decreases your body's ability to fight infections. Try to avoid being around people who are  sick. This medicine may increase your risk to bruise or bleed. Call your doctor or health care professional if you notice any unusual bleeding. Be careful brushing and flossing your teeth or using a toothpick because you may get an infection or bleed more easily. If you have any dental work done, tell your dentist you are receiving this medicine. Avoid taking products that contain aspirin, acetaminophen, ibuprofen, naproxen, or ketoprofen unless instructed by your doctor. These medicines may hide a fever. Do not become pregnant while taking this medicine. Women should inform their doctor if they wish to become pregnant or think they might be pregnant. There is a potential for serious side effects to an unborn child. Talk to your health care professional or pharmacist for more information. Do not breast-feed an infant while taking this medicine. Men should inform their doctor if they wish to father a child. This medicine may lower sperm counts. Do not treat diarrhea with over the counter products. Contact your doctor if you have diarrhea that lasts more than 2 days or if it is severe and watery. This medicine can make you more sensitive to the sun. Keep out of the sun. If you cannot avoid being in the sun, wear protective clothing and use sunscreen. Do not use sun lamps or tanning beds/booths. What side effects may I notice from receiving this medicine? Side effects that you should report to your doctor or health care professional as soon as  possible: -allergic reactions like skin rash, itching or hives, swelling of the face, lips, or tongue -low blood counts - this medicine may decrease the number of white blood cells, red blood cells and platelets. You may be at increased risk for infections and bleeding. -signs of infection - fever or chills, cough, sore throat, pain or difficulty passing urine -signs of decreased platelets or bleeding - bruising, pinpoint red spots on the skin, black, tarry stools,  blood in the urine -signs of decreased red blood cells - unusually weak or tired, fainting spells, lightheadedness -breathing problems -changes in vision -chest pain -mouth sores -nausea and vomiting -pain, swelling, redness at site where injected -pain, tingling, numbness in the hands or feet -redness, swelling, or sores on hands or feet -stomach pain -unusual bleeding Side effects that usually do not require medical attention (report to your doctor or health care professional if they continue or are bothersome): -changes in finger or toe nails -diarrhea -dry or itchy skin -hair loss -headache -loss of appetite -sensitivity of eyes to the light -stomach upset -unusually teary eyes This list may not describe all possible side effects. Call your doctor for medical advice about side effects. You may report side effects to FDA at 1-800-FDA-1088. Where should I keep my medicine? This drug is given in a hospital or clinic and will not be stored at home. NOTE: This sheet is a summary. It may not cover all possible information. If you have questions about this medicine, talk to your doctor, pharmacist, or health care provider.  2018 [employer] Standard (2007-12-03 13:53:16)

## 2017-03-11 NOTE — Procedures (Signed)
Ultrasound/fluoroscopic guided placement of single-lumen right basilic vein PICC. Length 36 cm. Tip SVC/ right atrial junction. No immediate complications. Okay to use. Medications given-1% lidocaine to skin and subcutaneous tissue.

## 2017-03-11 NOTE — Progress Notes (Signed)
Denise Wolfe  [phone] [phone]  Clinic Follow up Note   Patient Care Team: [name], DO as PCP - General (Family Medicine) Silverio Decamp, MD as Consulting Physician (Family Medicine) 03/11/2017  SUMMARY OF ONCOLOGIC HISTORY:   Squamous cell carcinoma of rectum ([place])   02/11/2017 Procedure    Colonoscopy 02/11/17 IMPRESSION - One 12 to 15 mm polyp in the rectum (vs proximal anus). Biopsied. Firm, - just inside anal verge or on it? - One 2 mm polyp in the descending colon, removed with a cold biopsy forceps. Resected and retrieved. - The examination was otherwise normal on direct and retroflexion views.       02/11/2017 Pathology Results    Diagnosis 02/11/17 1. Colon, biopsy, descending, polyp - TUBULAR ADENOMA (ONE FRAGMENT). - NO HIGH GRADE DYSPLASIA OR MALIGNANCY. 2. Colon, polyp(s), rectum, polyp - biopsy only - SQUAMOUS CELL CARCINOMA, BASALOID-TYPE. - SEE MICROSCOPIC DESCRIPTION. Microscopic Comment 2. There are multiple fragments of rectal mucosa which are involved by poorly differentiated squamous cell carcinoma with basaloid features.      02/14/2017 Initial Diagnosis    Squamous cell carcinoma of rectum ([place])     02/20/2017 Imaging    CT CAP W Contrast 02/20/17 IMPRESSION: 1. Abnormal right pelvic sidewall lymph node measuring 1.9 x 1.6 cm. Imaging features highly suspicious for metastatic disease. PET-CT may prove helpful to further evaluate. Borderline lymph node identified at the right iliac bifurcation and an upper normal size lymph node is identified along the left external iliac artery chain. 2. Asymmetric wall thickening distal rectum, presumably the location of primary neoplasm. 3. Asymmetric airspace opacity in the left apex has a wedge shaped configuration on coronal imaging. This may be an area of scarring or potentially infectious/inflammatory etiology. Attention on follow-up recommended. 4. Tiny  right perifissural nodule in the right middle lobe, likely subpleural lymph node but attention on follow-up recommended. 5. Multiple low-density well-defined liver lesions, most suggestive of cysts. 6.  Aortic Atherosclerois (ICD10-170.0)      02/25/2017 PET scan    PET 02/25/17 IMPRESSION: 1. Focal accentuated activity in the anus as detailed above, with bilateral external iliac hypermetabolic adenopathy indicating metastatic disease. No hypermetabolic lesion of the liver or other findings of metastatic disease. 2. The tiny nodule adjacent to the minor fissure does not demonstrate hypermetabolic activity but is below sensitive PET-CT size thresholds. This may warrant surveillance. 3. Other imaging findings of potential clinical significance: Aortic Atherosclerosis (ICD10-I70.0). Coronary atherosclerosis. Asymmetric right degenerative glenohumeral arthropathy. Small calcified thyroid nodules are present but not hypermetabolic.      03/11/2017 -  Radiation Therapy    Concurrent Chemo Radiation with Dr. Lisbeth Renshaw      03/11/2017 -  Chemotherapy    Concurrent Chemo radiation with with mitomycin C and 5-fluorouracil.         CURRENT THERAPY:  Concurrent chemotherapoy and Radiation with mitomycin C on Day 1 and 29 and 5-fluorouracil on day 1-5, 29-33   INTERVAL HISTORY:  Deboraha Goar is here for a follow up and start chemo and radiation.  She presents to the clinic today with her daughter. They wonder if this cancer is related to her breast cancer. She wonders what she does after chemo and radiation. With her previous breast cancer she had tumor markers regularly ran. She wants to know if testing tumor markers will help. She denies any new pain. She denies bleeding from her rectum. She has had chest pain last fall. She did not  see cardiologist, but saw PCP. She had a stress test and echo and nothing showed.    REVIEW OF SYSTEMS:   Constitutional: Denies fevers, chills or abnormal  weight loss Eyes: Denies blurriness of vision Ears, nose, mouth, throat, and face: Denies mucositis or sore throat Respiratory: Denies cough, dyspnea or wheezes Cardiovascular: Denies palpitation, chest discomfort or lower extremity swelling Gastrointestinal:  Denies nausea, heartburn or change in bowel habits Skin: Denies abnormal skin rashes Lymphatics: Denies new lymphadenopathy or easy bruising Neurological:Denies numbness, tingling or new weaknesses Behavioral/Psych: Mood is stable, no new changes  All other systems were reviewed with the patient and are negative.   MEDICAL HISTORY:  Past Medical History:  Diagnosis Date  . Anemia    during childhood  . Arthritis    hands, shoulder  . Breast cancer ([place]) 03/2000   left side modified mastecomy  . Cancer [facility])    breast, age 53; left side  . Colon cancer ([place]) 02/11/2017   rectum-squamous cell carcinoma  . Depression   . Hypertension   . Squamous cell carcinoma of rectum ([place]) 02/14/2017    SURGICAL HISTORY: Past Surgical History:  Procedure Laterality Date  . ABDOMINAL HYSTERECTOMY     With bilateral oophorectomy. 2002.  Marland Kitchen BREAST BIOPSY Right 2004   benign  . BREAST SURGERY Left 2001   biopsy  . CATARACT EXTRACTION Bilateral 2017  . IR FLUORO GUIDE CV LINE RIGHT  03/11/2017  . IR US GUIDE VASC ACCESS RIGHT  03/11/2017  . MASTECTOMY MODIFIED RADICAL Left 2001  . MENISCUS DEBRIDEMENT Right 2003  . TUMOR EXCISION Right    thigh area    I have reviewed the social history and family history with the patient and they are unchanged from previous note.  ALLERGIES:  is allergic to ibuprofen.  MEDICATIONS:  Current Outpatient Prescriptions  Medication Sig Dispense Refill  . amLODipine (NORVASC) 5 MG tablet Take 1 tablet (5 mg total) by mouth daily. 90 tablet 3  . amphetamine-dextroamphetamine (ADDERALL) 10 MG tablet Take 1 tablet (10 mg total) by mouth 2 (two) times daily. 60 tablet 0  . atorvastatin (LIPITOR) 80 MG  tablet Take 0.5 tablets (40 mg total) by mouth daily at 6 PM. 45 tablet 3  . cholecalciferol (VITAMIN D) 400 units TABS tablet Take 400 Units by mouth daily.    Marland Kitchen losartan-hydrochlorothiazide (HYZAAR) 50-12.5 MG tablet Start with one half tablet daily for 1 week, then 1 full tablet daily (Patient taking differently: Take 1 tablet by mouth daily. Start with one half tablet daily for 1 week, then 1 full tablet daily) 90 tablet 3  . ondansetron (ZOFRAN) 8 MG tablet Take 1 tablet (8 mg total) by mouth every 8 (eight) hours as needed for nausea or vomiting. 20 tablet 0  . prochlorperazine (COMPAZINE) 10 MG tablet Take 1 tablet (10 mg total) by mouth every 6 (six) hours as needed for nausea or vomiting. 30 tablet 0  . sertraline (ZOLOFT) 50 MG tablet Take 1 tablet (50 mg total) by mouth daily. 90 tablet 3   Current Facility-Administered Medications  Medication Dose Route Frequency Provider Last Rate Last Dose  . 0.9 %  sodium chloride infusion  500 mL Intravenous Continuous [name], MD       Facility-Administered Medications Ordered in Other Visits  Medication Dose Route Frequency Provider Last Rate Last Dose  . lidocaine (XYLOCAINE) 1 % (with pres) injection             PHYSICAL EXAMINATION:  ECOG PERFORMANCE STATUS: 0 - Asymptomatic  Vitals:   03/11/17 1514  BP: (!) 178/71  Pulse: 81  Resp: 18  Temp: 97.9 F (36.6 C)   Filed Weights   03/11/17 1514  Weight: 132 lb 4.8 oz (60 kg)    GENERAL:alert, no distress and comfortable SKIN: skin color, texture, turgor are normal, no rashes or significant lesions EYES: normal, Conjunctiva are pink and non-injected, sclera clear OROPHARYNX:no exudate, no erythema and lips, buccal mucosa, and tongue normal  NECK: supple, thyroid normal size, non-tender, without nodularity LYMPH:  no palpable lymphadenopathy in the cervical, axillary or inguinal LUNGS: clear to auscultation and percussion with normal breathing effort HEART: regular rate &  rhythm and no murmurs and no lower extremity edema ABDOMEN:abdomen soft, non-tender and normal bowel sounds, No local megaly. Digital exam deferred today, PSA showed a 1.5 cm anterior low rectum mass. Musculoskeletal:no cyanosis of digits and no clubbing  NEURO: alert & oriented x 3 with fluent speech, no focal motor/sensory deficits  LABORATORY DATA:  I have reviewed the data as listed CBC Latest Ref Rng & Units 03/11/2017 02/15/2017 07/11/2016  WBC 3.9 - 10.3 10e3/uL 4.4 4.3 4.6  Hemoglobin 11.6 - 15.9 g/dL 12.7 13.0 14.8  Hematocrit 34.8 - 46.6 % 36.8 36.7 42.7  Platelets 145 - 400 10e3/uL 375 356.0 348     CMP Latest Ref Rng & Units 03/11/2017 02/15/2017 09/05/2016  Glucose 70 - 140 mg/dl 90 107(H) 83  BUN 7.0 - 26.0 mg/dL 16.3 21 15   Creatinine 0.6 - 1.1 mg/dL 0.7 0.80 0.75  Sodium 136 - 145 mEq/L 141 139 142  Potassium 3.5 - 5.1 mEq/L 3.8 3.9 5.0  Chloride 96 - 112 mEq/L - 103 99  CO2 22 - 29 mEq/L 27 28 26   Calcium 8.4 - 10.4 mg/dL 10.2 9.5 9.9  Total Protein 6.4 - 8.3 g/dL 6.9 6.6 -  Total Bilirubin 0.20 - 1.20 mg/dL 0.63 0.4 -  Alkaline Phos 40 - 150 U/L 42 40 -  AST 5 - 34 U/L 21 15 -  ALT 0 - 55 U/L 16 12 17    PATHOLOGY Diagnosis 02/11/17 1. Colon, biopsy, descending, polyp - TUBULAR ADENOMA (ONE FRAGMENT). - NO HIGH GRADE DYSPLASIA OR MALIGNANCY. 2. Colon, polyp(s), rectum, polyp - biopsy only - SQUAMOUS CELL CARCINOMA, BASALOID-TYPE. - SEE MICROSCOPIC DESCRIPTION. Microscopic Comment 2. There are multiple fragments of rectal mucosa which are involved by poorly differentiated squamous cell carcinoma with basaloid features.   PROCEDURES Colonoscopy 02/11/17 IMPRESSION - One 12 to 15 mm polyp in the rectum (vs proximal anus). Biopsied. Firm, - just inside anal verge or on it? - One 2 mm polyp in the descending colon, removed with a cold biopsy forceps. Resected and retrieved. - The examination was otherwise normal on direct and retroflexion views.   RADIOGRAPHIC  STUDIES: I have personally reviewed the radiological images as listed and agreed with the findings in the report. Ir Fluoro Guide Cv Line Right  Result Date: 03/11/2017 INDICATION: Patient with history of squamous cell carcinoma of anal canal; central venous access requested for chemotherapy. EXAM: RIGHT UPPER EXTREMITY PICC LINE PLACEMENT WITH ULTRASOUND AND FLUOROSCOPIC GUIDANCE MEDICATIONS: 1% lidocaine to skin and subcutaneous tissue ANESTHESIA/SEDATION: None : Fluoroscopy Time:  18 seconds (2 mGy). COMPLICATIONS: None immediate. PROCEDURE: The patient was advised of the possible risks and complications and agreed to undergo the procedure. The patient was then brought to the angiographic suite for the procedure. The right arm was prepped with chlorhexidine, draped in  the usual sterile fashion using maximum barrier technique (cap and mask, sterile gown, sterile gloves, large sterile sheet, hand hygiene and cutaneous antisepsis) and infiltrated locally with 1% Lidocaine. Ultrasound demonstrated patency of the right basilic vein, and this was documented with an image. Under real-time ultrasound guidance, this vein was accessed with a 21 gauge micropuncture needle and image documentation was performed. A 0.018 wire was introduced in to the vein. Over this, a 5 [place] single lumen power injectable PICC was advanced to the lower SVC/right atrial junction. Fluoroscopy during the procedure and fluoro spot radiograph confirms appropriate catheter position. The catheter was flushed and covered with a sterile dressing. Catheter length:  36 cm IMPRESSION: Successful right arm Power PICC line placement with ultrasound and fluoroscopic guidance. The catheter is ready for use. Read by: Rowe Robert, PA-C Electronically Signed   By: Corrie Mckusick D.O.   On: 03/11/2017 13:44   Ir US Guide Vasc Access Right  Result Date: 03/11/2017 INDICATION: Patient with history of squamous cell carcinoma of anal canal; central venous  access requested for chemotherapy. EXAM: RIGHT UPPER EXTREMITY PICC LINE PLACEMENT WITH ULTRASOUND AND FLUOROSCOPIC GUIDANCE MEDICATIONS: 1% lidocaine to skin and subcutaneous tissue ANESTHESIA/SEDATION: None : Fluoroscopy Time:  18 seconds (2 mGy). COMPLICATIONS: None immediate. PROCEDURE: The patient was advised of the possible risks and complications and agreed to undergo the procedure. The patient was then brought to the angiographic suite for the procedure. The right arm was prepped with chlorhexidine, draped in the usual sterile fashion using maximum barrier technique (cap and mask, sterile gown, sterile gloves, large sterile sheet, hand hygiene and cutaneous antisepsis) and infiltrated locally with 1% Lidocaine. Ultrasound demonstrated patency of the right basilic vein, and this was documented with an image. Under real-time ultrasound guidance, this vein was accessed with a 21 gauge micropuncture needle and image documentation was performed. A 0.018 wire was introduced in to the vein. Over this, a 5 [place] single lumen power injectable PICC was advanced to the lower SVC/right atrial junction. Fluoroscopy during the procedure and fluoro spot radiograph confirms appropriate catheter position. The catheter was flushed and covered with a sterile dressing. Catheter length:  36 cm IMPRESSION: Successful right arm Power PICC line placement with ultrasound and fluoroscopic guidance. The catheter is ready for use. Read by: Rowe Robert, PA-C Electronically Signed   By: Corrie Mckusick D.O.   On: 03/11/2017 13:44     ASSESSMENT & PLAN:  Denisse Whitenack is here for a follow up. She has a past medical history of breast cancer in 2001, Anemia, Arthritis and HTN.    1. Squamous Cell Carcinoma of Rectum, [id number] -She underwent a screening colonoscopy 02/11/2017. Findings included a 12-15 mm polyp in the rectum, semi-sessile. A 2 mm polyp was found in the descending colon. Pathology on the rectal polyp showed squamous cell  carcinoma, basaloid type. The descending colon polyp showed tubular adenoma -CT scans of the chest/abdomen/pelvis 02/19/2017 showed an abnormal right pelvic sidewall lymph node measuring 1.9 x 1.6 cm -We discussed her PET from 02/25/17. The PET shows enlarged hypermetabolic bilateral external iliac lymph nodes (2), no distant metastasis.  -Patient had a question if her pelvic and inguinal metastasis from her previous breast cancer. Were discussed this is unlikely, given the distribution of the lymph nodes which is more consistent with rectal cancer metastasis. Her previous breast cancer was diagnosed 17 years ago, hormonal sensitive, and she received a standard treatment. No evidence of recurrence since then. -Although she has locally advanced  disease, this is still considered curable with concurrent chemotherapy and irradiation. -I recommend a standard chemotherapy with 5-FU and mitomycin regimen, starting today. -I recommend concurrent chemotherapy with mitomycin C and 5-fluorouracil.  --Chemotherapy consent: Side effects including but does not not limited to, fatigue, nausea, vomiting, diarrhea, hair loss, neuropathy, fluid retention, renal and kidney dysfunction, neutropenic fever, needed for blood transfusion, bleeding, coronary artery spasm and heart attack, heart failure, were discussed with patient in great detail. She agrees to proceed. -The goal of therapy is curative. We discussed the risk is that surgery is preserved for residual disease or recurrent disease. -Started radiation with Dr. Lisbeth Renshaw on 03/11/17 and had PICC lined placed today for chemo  -We discussed cancer surveillance, we will plan to repeat a PET scan in 3-4 months after she completes chemotherapy and radiation. She will be followed routinely with lab, exam and surveillance PET scan. I encouraged her to do follow-up visits with her surgeon Dr. Marcello Moores and me alternatively.  -She will proceed with chemo today and she knows to contact  us if needed.  -Lab and follow-up weekly during chemotherapy treatment   2. Hx of left Breast Cancer, stage 3 -Diagnosed in 2001 -Had mastectomy and ALND, adjuvant chemo therapy and radiation, took tamoxifen and aromasin -Mother diagnosed at age 74. Declines referral to genetics counselor. -We discussed that it is not likely that breast cancer has occurred in her rectum. -I recommend genetic testing due to her family history and personal history of breast cancer. She will think about it.   2. Hypertension - currently on Norvasc and Hyzaar.  3. Depression - currently on Zoloft.  4. Hypercholesterolemia -on Lipitor.   PLAN:  -Labs reviewed and adequate to start concurrent chemo radiation today.  -F/u with lab and infusion weekly.    Orders Placed This Encounter  Procedures  . CBC with Differential    Standing Status:   Standing    Number of Occurrences:   50    Standing Expiration Date:   03/10/2022  . Comprehensive metabolic panel    Standing Status:   Standing    Number of Occurrences:   50    Standing Expiration Date:   03/10/2022   All questions were answered. The patient knows to call the clinic with any problems, questions or concerns. No barriers to learning was detected.  I spent 25 minutes counseling the patient face to face. The total time spent in the appointment was 30 minutes and more than 50% was on counseling and review of test results     Truitt Merle, MD 03/11/2017

## 2017-03-12 ENCOUNTER — Ambulatory Visit
Admission: RE | Admit: 2017-03-12 | Discharge: 2017-03-12 | Disposition: A | Discharge status: Home / Self Care | Payer: Medicare Other | Source: Ambulatory Visit | Attending: Radiation Oncology | Admitting: Radiation Oncology

## 2017-03-12 ENCOUNTER — Encounter: Payer: Self-pay | Admitting: Nurse Practitioner

## 2017-03-12 DIAGNOSIS — Z853 Personal history of malignant neoplasm of breast: Secondary | ICD-10-CM | POA: Diagnosis not present

## 2017-03-12 DIAGNOSIS — C211 Malignant neoplasm of anal canal: Secondary | ICD-10-CM | POA: Diagnosis not present

## 2017-03-12 DIAGNOSIS — Z510 Encounter for antineoplastic radiation therapy: Secondary | ICD-10-CM | POA: Diagnosis not present

## 2017-03-12 DIAGNOSIS — C218 Malignant neoplasm of overlapping sites of rectum, anus and anal canal: Secondary | ICD-10-CM | POA: Diagnosis not present

## 2017-03-12 DIAGNOSIS — Z923 Personal history of irradiation: Secondary | ICD-10-CM | POA: Diagnosis not present

## 2017-03-12 DIAGNOSIS — F329 Major depressive disorder, single episode, unspecified: Secondary | ICD-10-CM | POA: Diagnosis not present

## 2017-03-12 DIAGNOSIS — I10 Essential (primary) hypertension: Secondary | ICD-10-CM | POA: Diagnosis not present

## 2017-03-12 NOTE — Progress Notes (Signed)
Pt education done,  anal , pelvis, discussed side effects ,skin irritation, nausea, vomiting, diarrhea, urinary frequency, fatigue, pain, rectal discomfort, dysuria,  Use of sitz bath prn for soothing, baby wipes without alcohol,  imodium prn diarrhea, low fiber diet if having diarrhea, may need to eat smaller meals 5-6  And snacks in between for nuasea, rx for nausea if needed, increase protein in diet, stay hydrated, drink plenty water,  Mild soap unscented such as dove, , sees MD weekly and prn,

## 2017-03-13 ENCOUNTER — Ambulatory Visit
Admission: RE | Admit: 2017-03-13 | Discharge: 2017-03-13 | Disposition: A | Discharge status: Home / Self Care | Payer: Medicare Other | Source: Ambulatory Visit | Attending: Radiation Oncology | Admitting: Radiation Oncology

## 2017-03-13 DIAGNOSIS — Z853 Personal history of malignant neoplasm of breast: Secondary | ICD-10-CM | POA: Diagnosis not present

## 2017-03-13 DIAGNOSIS — I10 Essential (primary) hypertension: Secondary | ICD-10-CM | POA: Diagnosis not present

## 2017-03-13 DIAGNOSIS — Z923 Personal history of irradiation: Secondary | ICD-10-CM | POA: Diagnosis not present

## 2017-03-13 DIAGNOSIS — Z510 Encounter for antineoplastic radiation therapy: Secondary | ICD-10-CM | POA: Diagnosis not present

## 2017-03-13 DIAGNOSIS — F329 Major depressive disorder, single episode, unspecified: Secondary | ICD-10-CM | POA: Diagnosis not present

## 2017-03-13 DIAGNOSIS — C211 Malignant neoplasm of anal canal: Secondary | ICD-10-CM | POA: Diagnosis not present

## 2017-03-13 DIAGNOSIS — C218 Malignant neoplasm of overlapping sites of rectum, anus and anal canal: Secondary | ICD-10-CM | POA: Diagnosis not present

## 2017-03-14 ENCOUNTER — Ambulatory Visit
Admission: RE | Admit: 2017-03-14 | Discharge: 2017-03-14 | Disposition: A | Discharge status: Home / Self Care | Payer: Medicare Other | Source: Ambulatory Visit | Attending: Radiation Oncology | Admitting: Radiation Oncology

## 2017-03-14 DIAGNOSIS — C218 Malignant neoplasm of overlapping sites of rectum, anus and anal canal: Secondary | ICD-10-CM | POA: Diagnosis not present

## 2017-03-14 DIAGNOSIS — C211 Malignant neoplasm of anal canal: Secondary | ICD-10-CM | POA: Diagnosis not present

## 2017-03-14 DIAGNOSIS — I10 Essential (primary) hypertension: Secondary | ICD-10-CM | POA: Diagnosis not present

## 2017-03-14 DIAGNOSIS — Z510 Encounter for antineoplastic radiation therapy: Secondary | ICD-10-CM | POA: Diagnosis not present

## 2017-03-14 DIAGNOSIS — Z853 Personal history of malignant neoplasm of breast: Secondary | ICD-10-CM | POA: Diagnosis not present

## 2017-03-14 DIAGNOSIS — Z923 Personal history of irradiation: Secondary | ICD-10-CM | POA: Diagnosis not present

## 2017-03-14 DIAGNOSIS — F329 Major depressive disorder, single episode, unspecified: Secondary | ICD-10-CM | POA: Diagnosis not present

## 2017-03-15 ENCOUNTER — Ambulatory Visit: Payer: Medicare Other

## 2017-03-15 ENCOUNTER — Ambulatory Visit (HOSPITAL_BASED_OUTPATIENT_CLINIC_OR_DEPARTMENT_OTHER): Payer: Medicare Other

## 2017-03-15 VITALS — BP 118/74 | HR 97 | Temp 98.7°F | Resp 20

## 2017-03-15 DIAGNOSIS — C20 Malignant neoplasm of rectum: Secondary | ICD-10-CM

## 2017-03-15 MED ORDER — HEPARIN SOD (PORK) LOCK FLUSH 100 UNIT/ML IV SOLN
500.0000 [IU] | Freq: Once | INTRAVENOUS | Status: AC | PRN
  Administered 2017-03-15: 500 [IU]
  Filled 2017-03-15: qty 5

## 2017-03-15 MED ORDER — SODIUM CHLORIDE 0.9% FLUSH
10.0000 mL | INTRAVENOUS | Status: DC | PRN
  Administered 2017-03-15: 10 mL
  Filled 2017-03-15: qty 10

## 2017-03-15 NOTE — Patient Instructions (Signed)
PICC Removal, Care After Refer to this sheet in the next few weeks. These instructions provide you with information on caring for yourself after your procedure. Your health care provider may also give you more specific instructions. Your treatment has been planned according to current medical practices, but problems sometimes occur. Call your health care provider if you have any problems or questions after your procedure. What can I expect after the procedure? After your procedure, it is typical to have mild discomfort at the insertion site. This should not last for more than a day. Follow these instructions at home: You may remove the bandage after 24 hours. The PICC insertion site is very small. A small scab may develop over the insertion site. It is okay to wash the site gently with soap and water. Be careful not to remove or pick off the scab. Gently pat the site dry after washing it. You do not need to put another bandage over the insertion site. Do not lift anything heavy or do strenuous physical activity for 24 hours after the PICC is removed. This includes:  Weight lifting.  Strenuous yard work.  Any physical activity with repetitive arm movement.  Contact a health care provider if:  You have swelling or puffiness in your arm at the PICC insertion site.  You have increasing tenderness at the PICC insertion site. Get help right away if:  You have numbness or tingling in your fingers, hand, or arm.  Your arm looks blue and feels cold.  You have redness around the insertion site or a red streak goes up your arm.  You have any type of drainage from the PICC insertion site. This includes drainage such as: ? Bleeding from the insertion site. If this happens, apply firm, direct pressure to the PICC insertion site with a clean towel. ? Drainage that is yellow or tan.  You have a fever. This information is not intended to replace advice given to you by your health care provider. Make  sure you discuss any questions you have with your health care provider. Document Released: 08/04/2013 Document Revised: 01/05/2016 Document Reviewed: 05/22/2013 Elsevier Interactive Patient Education  2017 [employer]  

## 2017-03-15 NOTE — Progress Notes (Signed)
Patient here today for pump d/c and PICC line removal.  Upon removal catheter line measures 36 cm and intact. Vasoline and 4 X 4 Gauze applied with CoFlex for pressure. Patient tolerated removal with no complaints.  Patient observation, and lying flat for 30 minutes post removal.  Post vital signs stable.  PICC line removal discharge instructions reviewed with patient.  Patient verified understanding. Patient discharged home with daughter.

## 2017-03-18 ENCOUNTER — Other Ambulatory Visit (HOSPITAL_BASED_OUTPATIENT_CLINIC_OR_DEPARTMENT_OTHER): Payer: Medicare Other

## 2017-03-18 ENCOUNTER — Ambulatory Visit
Admission: RE | Admit: 2017-03-18 | Discharge: 2017-03-18 | Disposition: A | Discharge status: Home / Self Care | Payer: Medicare Other | Source: Ambulatory Visit | Attending: Radiation Oncology | Admitting: Radiation Oncology

## 2017-03-18 ENCOUNTER — Ambulatory Visit (HOSPITAL_BASED_OUTPATIENT_CLINIC_OR_DEPARTMENT_OTHER): Payer: Medicare Other | Admitting: Nurse Practitioner

## 2017-03-18 ENCOUNTER — Other Ambulatory Visit: Payer: Self-pay | Admitting: *Deleted

## 2017-03-18 VITALS — BP 115/71 | HR 92 | Temp 97.8°F | Resp 17 | Ht 62.0 in | Wt 132.3 lb

## 2017-03-18 DIAGNOSIS — C20 Malignant neoplasm of rectum: Secondary | ICD-10-CM

## 2017-03-18 DIAGNOSIS — K1230 Oral mucositis (ulcerative), unspecified: Secondary | ICD-10-CM | POA: Diagnosis not present

## 2017-03-18 DIAGNOSIS — C218 Malignant neoplasm of overlapping sites of rectum, anus and anal canal: Secondary | ICD-10-CM | POA: Diagnosis not present

## 2017-03-18 DIAGNOSIS — D709 Neutropenia, unspecified: Secondary | ICD-10-CM

## 2017-03-18 DIAGNOSIS — I10 Essential (primary) hypertension: Secondary | ICD-10-CM

## 2017-03-18 DIAGNOSIS — Z923 Personal history of irradiation: Secondary | ICD-10-CM | POA: Diagnosis not present

## 2017-03-18 DIAGNOSIS — Z853 Personal history of malignant neoplasm of breast: Secondary | ICD-10-CM

## 2017-03-18 DIAGNOSIS — Z510 Encounter for antineoplastic radiation therapy: Secondary | ICD-10-CM | POA: Diagnosis not present

## 2017-03-18 DIAGNOSIS — C211 Malignant neoplasm of anal canal: Secondary | ICD-10-CM | POA: Diagnosis not present

## 2017-03-18 DIAGNOSIS — F329 Major depressive disorder, single episode, unspecified: Secondary | ICD-10-CM | POA: Diagnosis not present

## 2017-03-18 LAB — CBC WITH DIFFERENTIAL/PLATELET
BASO%: 0.8 % (ref 0.0–2.0)
BASOS ABS: 0 10*3/uL (ref 0.0–0.1)
EOS%: 6.9 % (ref 0.0–7.0)
Eosinophils Absolute: 0.1 10*3/uL (ref 0.0–0.5)
HEMATOCRIT: 32.2 % — AB (ref 34.8–46.6)
HEMOGLOBIN: 11.2 g/dL — AB (ref 11.6–15.9)
LYMPH#: 0.5 10*3/uL — AB (ref 0.9–3.3)
LYMPH%: 33.7 % (ref 14.0–49.7)
MCH: 32.4 pg (ref 25.1–34.0)
MCHC: 34.7 g/dL (ref 31.5–36.0)
MCV: 93.3 fL (ref 79.5–101.0)
MONO#: 0.1 10*3/uL (ref 0.1–0.9)
MONO%: 5.3 % (ref 0.0–14.0)
NEUT#: 0.8 10*3/uL — ABNORMAL LOW (ref 1.5–6.5)
NEUT%: 53.3 % (ref 38.4–76.8)
Platelets: 271 10*3/uL (ref 145–400)
RBC: 3.45 10*6/uL — ABNORMAL LOW (ref 3.70–5.45)
RDW: 13.2 % (ref 11.2–14.5)
WBC: 1.4 10*3/uL — ABNORMAL LOW (ref 3.9–10.3)

## 2017-03-18 LAB — COMPREHENSIVE METABOLIC PANEL
ALBUMIN: 3.6 g/dL (ref 3.5–5.0)
ALK PHOS: 41 U/L (ref 40–150)
ALT: 12 U/L (ref 0–55)
AST: 15 U/L (ref 5–34)
Anion Gap: 7 mEq/L (ref 3–11)
BILIRUBIN TOTAL: 0.53 mg/dL (ref 0.20–1.20)
BUN: 19.1 mg/dL (ref 7.0–26.0)
CO2: 27 mEq/L (ref 22–29)
CREATININE: 0.7 mg/dL (ref 0.6–1.1)
Calcium: 9.2 mg/dL (ref 8.4–10.4)
Chloride: 105 mEq/L (ref 98–109)
EGFR: 86 mL/min/{1.73_m2} — ABNORMAL LOW (ref >90)
GLUCOSE: 75 mg/dL (ref 70–140)
Potassium: 4 mEq/L (ref 3.5–5.1)
SODIUM: 139 meq/L (ref 136–145)
TOTAL PROTEIN: 6.3 g/dL — AB (ref 6.4–8.3)

## 2017-03-18 MED ORDER — MAGIC MOUTHWASH: 5.0000 mL | Freq: Four times a day (QID) | ORAL | 1 refills | Status: DC | PRN

## 2017-03-18 MED ORDER — MAGIC MOUTHWASH: ORAL | 1 refills | Status: DC

## 2017-03-18 NOTE — Progress Notes (Signed)
[place] OFFICE PROGRESS NOTE     Squamous cell carcinoma of rectum ([place])   02/11/2017 Procedure    Colonoscopy 02/11/17 IMPRESSION - One 12 to 15 mm polyp in the rectum (vs proximal anus). Biopsied. Firm, - just inside anal verge or on it? - One 2 mm polyp in the descending colon, removed with a cold biopsy forceps. Resected and retrieved. - The examination was otherwise normal on direct and retroflexion views.       02/11/2017 Pathology Results    Diagnosis 02/11/17 1. Colon, biopsy, descending, polyp - TUBULAR ADENOMA (ONE FRAGMENT). - NO HIGH GRADE DYSPLASIA OR MALIGNANCY. 2. Colon, polyp(s), rectum, polyp - biopsy only - SQUAMOUS CELL CARCINOMA, BASALOID-TYPE. - SEE MICROSCOPIC DESCRIPTION. Microscopic Comment 2. There are multiple fragments of rectal mucosa which are involved by poorly differentiated squamous cell carcinoma with basaloid features.      02/14/2017 Initial Diagnosis    Squamous cell carcinoma of rectum ([place])     02/20/2017 Imaging    CT CAP W Contrast 02/20/17 IMPRESSION: 1. Abnormal right pelvic sidewall lymph node measuring 1.9 x 1.6 cm. Imaging features highly suspicious for metastatic disease. PET-CT may prove helpful to further evaluate. Borderline lymph node identified at the right iliac bifurcation and an upper normal size lymph node is identified along the left external iliac artery chain. 2. Asymmetric wall thickening distal rectum, presumably the location of primary neoplasm. 3. Asymmetric airspace opacity in the left apex has a wedge shaped configuration on coronal imaging. This may be an area of scarring or potentially infectious/inflammatory etiology. Attention on follow-up recommended. 4. Tiny right perifissural nodule in the right middle lobe, likely subpleural lymph node but attention on follow-up recommended. 5. Multiple low-density well-defined liver lesions, most suggestive of cysts. 6.  Aortic  Atherosclerois (ICD10-170.0)      02/25/2017 PET scan    PET 02/25/17 IMPRESSION: 1. Focal accentuated activity in the anus as detailed above, with bilateral external iliac hypermetabolic adenopathy indicating metastatic disease. No hypermetabolic lesion of the liver or other findings of metastatic disease. 2. The tiny nodule adjacent to the minor fissure does not demonstrate hypermetabolic activity but is below sensitive PET-CT size thresholds. This may warrant surveillance. 3. Other imaging findings of potential clinical significance: Aortic Atherosclerosis (ICD10-I70.0). Coronary atherosclerosis. Asymmetric right degenerative glenohumeral arthropathy. Small calcified thyroid nodules are present but not hypermetabolic.      03/11/2017 -  Radiation Therapy    Concurrent Chemo Radiation with Dr. Lisbeth Renshaw      03/11/2017 -  Chemotherapy    Concurrent Chemo radiation with with mitomycin C and 5-fluorouracil.      CURRENT THERAPY:  Concurrent chemotherapy and Radiation with mitomycin C on Day 1 and 29 and 5-fluorouracil on day 1-5, 29-33  INTERVAL HISTORY:   Denise Wolfe returns as scheduled. She began radiation and cycle 1 mitomycin-C/5-FU 03/11/2017. She denies significant nausea. She developed mouth sores over the weekend. She notes a sore throat. She is tolerating fluids without difficulty. She has had a few episodes of loose stools. No fever. No shaking chills.  Objective:  Vital signs in last 24 hours:  Blood pressure 115/71, pulse 92, temperature 97.8 F (36.6 C), temperature source Oral, resp. rate 17, height 5\' 2"  (1.575 m), weight 132 lb 4.8 oz (60 kg), SpO2 100 %.    HEENT: Erythema/ulceration at the right posterior upper buccal mucosa. Mild white coating over tongue. Resp: Lungs clear bilaterally. Cardio: Regular rate and rhythm. GI: Abdomen soft and nontender. No  hepatomegaly. Vascular: No leg edema. Skin: External hemorrhoid. No erythema or skin  breakdown at the perineum.    Lab Results:  Lab Results  Component Value Date   WBC 1.4 (L) 03/18/2017   HGB 11.2 (L) 03/18/2017   HCT 32.2 (L) 03/18/2017   MCV 93.3 03/18/2017   PLT 271 03/18/2017   NEUTROABS 0.8 (L) 03/18/2017    Imaging:  No results found.  Medications: I have reviewed the patient's current medications.  Assessment/Plan: 1. Squamous cell carcinoma of the rectum versus proximal anus.   Colonoscopy 02/11/2017 showed a 12-15 mm polyp in the rectum, semi-sessile. A 2 mm polyp was found in the descending colon. Pathology on the rectal polyp showed squamous cell carcinoma, basaloid type. The descending colon polyp pathology showed tubular adenoma.   CT scans of the chest/abdomen/pelvis 02/19/2017 showed an abnormal right pelvic sidewall lymph node measuring 1.9 x 1.6 cm; borderline lymph node identified at the right iliac bifurcation and an upper normal size lymph node identified along the left external iliac artery chain. There was asymmetric wall thickening at the distal rectum. There was an area of asymmetric airspace opacity in the left lung apex; a tiny right perifissural nodule in the right middle lobe, likely subpleural lymph node. Multiple low-density well-defined liver lesions were noted felt to likely be cysts.  PET scan scheduled 02/25/2017  Initiation of radiation 03/11/2017; cycle 1 mitomycin-C/5-FU 03/11/2017 2. History of left breast cancer 2001 diagnosed and treated in [place] status post mastectomy with patient report of 2 of 17 positive lymph nodes. She completed chemotherapy and radiation. 3. Hypertension currently on Norvasc and Hyzaar. 4. Depression currently on Zoloft. 5. Hypercholesterolemia on Lipitor. 6. Intermittent chest discomfort status post nuclear stress test 08/27/2016-normal study. 7. Personal and family history of breast cancer. Mother diagnosed at age 11. Declines referral to genetics counselor. 8. Mucositis secondary to  chemotherapy.   Disposition: Ms. Edmunds appears stable. She began radiation and cycle 1 mitomycin-C/5-FU 03/11/2017.   She has developed mild mucositis. We sent a prescription to her pharmacy for Magic mouthwash. She understands to contact the office if she is unable to maintain adequate hydration orally.  We reviewed today's labs. She has neutropenia. We reviewed neutropenic precautions. She understands to contact the office with fever, chills, other signs of infection.  She will return for labs and a follow-up visit in one week. She will contact the office in the interim as outlined above or with any other problems.  Ned Card ANP/GNP-BC   03/18/2017  10:42 AM

## 2017-03-19 ENCOUNTER — Ambulatory Visit
Admission: RE | Admit: 2017-03-19 | Discharge: 2017-03-19 | Disposition: A | Discharge status: Home / Self Care | Payer: Medicare Other | Source: Ambulatory Visit | Attending: Radiation Oncology | Admitting: Radiation Oncology

## 2017-03-19 DIAGNOSIS — Z923 Personal history of irradiation: Secondary | ICD-10-CM | POA: Diagnosis not present

## 2017-03-19 DIAGNOSIS — Z853 Personal history of malignant neoplasm of breast: Secondary | ICD-10-CM | POA: Diagnosis not present

## 2017-03-19 DIAGNOSIS — F329 Major depressive disorder, single episode, unspecified: Secondary | ICD-10-CM | POA: Diagnosis not present

## 2017-03-19 DIAGNOSIS — Z510 Encounter for antineoplastic radiation therapy: Secondary | ICD-10-CM | POA: Diagnosis not present

## 2017-03-19 DIAGNOSIS — C218 Malignant neoplasm of overlapping sites of rectum, anus and anal canal: Secondary | ICD-10-CM | POA: Diagnosis not present

## 2017-03-19 DIAGNOSIS — I10 Essential (primary) hypertension: Secondary | ICD-10-CM | POA: Diagnosis not present

## 2017-03-19 DIAGNOSIS — C211 Malignant neoplasm of anal canal: Secondary | ICD-10-CM | POA: Diagnosis not present

## 2017-03-20 ENCOUNTER — Ambulatory Visit
Admission: RE | Admit: 2017-03-20 | Discharge: 2017-03-20 | Disposition: A | Discharge status: Home / Self Care | Payer: Medicare Other | Source: Ambulatory Visit | Attending: Radiation Oncology | Admitting: Radiation Oncology

## 2017-03-20 DIAGNOSIS — Z853 Personal history of malignant neoplasm of breast: Secondary | ICD-10-CM | POA: Diagnosis not present

## 2017-03-20 DIAGNOSIS — C211 Malignant neoplasm of anal canal: Secondary | ICD-10-CM | POA: Diagnosis not present

## 2017-03-20 DIAGNOSIS — I10 Essential (primary) hypertension: Secondary | ICD-10-CM | POA: Diagnosis not present

## 2017-03-20 DIAGNOSIS — Z510 Encounter for antineoplastic radiation therapy: Secondary | ICD-10-CM | POA: Diagnosis not present

## 2017-03-20 DIAGNOSIS — C218 Malignant neoplasm of overlapping sites of rectum, anus and anal canal: Secondary | ICD-10-CM | POA: Diagnosis not present

## 2017-03-20 DIAGNOSIS — Z923 Personal history of irradiation: Secondary | ICD-10-CM | POA: Diagnosis not present

## 2017-03-20 DIAGNOSIS — F329 Major depressive disorder, single episode, unspecified: Secondary | ICD-10-CM | POA: Diagnosis not present

## 2017-03-21 ENCOUNTER — Ambulatory Visit
Admission: RE | Admit: 2017-03-21 | Discharge: 2017-03-21 | Disposition: A | Discharge status: Home / Self Care | Payer: Medicare Other | Source: Ambulatory Visit | Attending: Radiation Oncology | Admitting: Radiation Oncology

## 2017-03-21 ENCOUNTER — Telehealth: Payer: Self-pay | Admitting: *Deleted

## 2017-03-21 DIAGNOSIS — I10 Essential (primary) hypertension: Secondary | ICD-10-CM | POA: Diagnosis not present

## 2017-03-21 DIAGNOSIS — C218 Malignant neoplasm of overlapping sites of rectum, anus and anal canal: Secondary | ICD-10-CM | POA: Diagnosis not present

## 2017-03-21 DIAGNOSIS — F329 Major depressive disorder, single episode, unspecified: Secondary | ICD-10-CM | POA: Diagnosis not present

## 2017-03-21 DIAGNOSIS — Z510 Encounter for antineoplastic radiation therapy: Secondary | ICD-10-CM | POA: Diagnosis not present

## 2017-03-21 DIAGNOSIS — C211 Malignant neoplasm of anal canal: Secondary | ICD-10-CM | POA: Diagnosis not present

## 2017-03-21 DIAGNOSIS — Z853 Personal history of malignant neoplasm of breast: Secondary | ICD-10-CM | POA: Diagnosis not present

## 2017-03-21 DIAGNOSIS — Z923 Personal history of irradiation: Secondary | ICD-10-CM | POA: Diagnosis not present

## 2017-03-21 MED ORDER — FLUCONAZOLE 100 MG PO TABS: 100.0000 mg | ORAL_TABLET | Freq: Every day | ORAL | 0 refills | Status: DC

## 2017-03-21 NOTE — Telephone Encounter (Signed)
Patient presented to [facility] with complaints her mouth was sore and the thrush has increased in severity. Discussed with Lattie Haw, NP. Telephone call to patient- Diflucan was sent to the pharmacy. Patient will need to hold lipitor while taking diflucan. Patient stated she has not been taking her cholesterol medication because it hurts when she swallows it. She will continue to hold it for 1 week after starting the antifungal today. Patient understands to call this office with any further concerns or questions.

## 2017-03-22 ENCOUNTER — Ambulatory Visit
Admission: RE | Admit: 2017-03-22 | Discharge: 2017-03-22 | Disposition: A | Discharge status: Home / Self Care | Payer: Medicare Other | Source: Ambulatory Visit | Attending: Radiation Oncology | Admitting: Radiation Oncology

## 2017-03-22 DIAGNOSIS — C218 Malignant neoplasm of overlapping sites of rectum, anus and anal canal: Secondary | ICD-10-CM | POA: Diagnosis not present

## 2017-03-22 DIAGNOSIS — I10 Essential (primary) hypertension: Secondary | ICD-10-CM | POA: Diagnosis not present

## 2017-03-22 DIAGNOSIS — Z853 Personal history of malignant neoplasm of breast: Secondary | ICD-10-CM | POA: Diagnosis not present

## 2017-03-22 DIAGNOSIS — Z510 Encounter for antineoplastic radiation therapy: Secondary | ICD-10-CM | POA: Diagnosis not present

## 2017-03-22 DIAGNOSIS — F329 Major depressive disorder, single episode, unspecified: Secondary | ICD-10-CM | POA: Diagnosis not present

## 2017-03-22 DIAGNOSIS — Z923 Personal history of irradiation: Secondary | ICD-10-CM | POA: Diagnosis not present

## 2017-03-22 DIAGNOSIS — C211 Malignant neoplasm of anal canal: Secondary | ICD-10-CM | POA: Diagnosis not present

## 2017-03-22 NOTE — Progress Notes (Signed)
[place]  [phone] [phone]  Clinic Follow up Note   Patient Care Team: [name], DO as PCP - General (Family Medicine) Silverio Decamp, MD as Consulting Physician (Family Medicine) 03/25/2017  SUMMARY OF ONCOLOGIC HISTORY:   Squamous cell carcinoma of rectum ([place])   02/11/2017 Procedure    Colonoscopy 02/11/17 IMPRESSION - One 12 to 15 mm polyp in the rectum (vs proximal anus). Biopsied. Firm, - just inside anal verge or on it? - One 2 mm polyp in the descending colon, removed with a cold biopsy forceps. Resected and retrieved. - The examination was otherwise normal on direct and retroflexion views.       02/11/2017 Pathology Results    Diagnosis 02/11/17 1. Colon, biopsy, descending, polyp - TUBULAR ADENOMA (ONE FRAGMENT). - NO HIGH GRADE DYSPLASIA OR MALIGNANCY. 2. Colon, polyp(s), rectum, polyp - biopsy only - SQUAMOUS CELL CARCINOMA, BASALOID-TYPE. - SEE MICROSCOPIC DESCRIPTION. Microscopic Comment 2. There are multiple fragments of rectal mucosa which are involved by poorly differentiated squamous cell carcinoma with basaloid features.      02/14/2017 Initial Diagnosis    Squamous cell carcinoma of rectum ([place])     02/20/2017 Imaging    CT CAP W Contrast 02/20/17 IMPRESSION: 1. Abnormal right pelvic sidewall lymph node measuring 1.9 x 1.6 cm. Imaging features highly suspicious for metastatic disease. PET-CT may prove helpful to further evaluate. Borderline lymph node identified at the right iliac bifurcation and an upper normal size lymph node is identified along the left external iliac artery chain. 2. Asymmetric wall thickening distal rectum, presumably the location of primary neoplasm. 3. Asymmetric airspace opacity in the left apex has a wedge shaped configuration on coronal imaging. This may be an area of scarring or potentially infectious/inflammatory etiology. Attention on follow-up recommended. 4. Tiny  right perifissural nodule in the right middle lobe, likely subpleural lymph node but attention on follow-up recommended. 5. Multiple low-density well-defined liver lesions, most suggestive of cysts. 6.  Aortic Atherosclerois (ICD10-170.0)      02/25/2017 PET scan    PET 02/25/17 IMPRESSION: 1. Focal accentuated activity in the anus as detailed above, with bilateral external iliac hypermetabolic adenopathy indicating metastatic disease. No hypermetabolic lesion of the liver or other findings of metastatic disease. 2. The tiny nodule adjacent to the minor fissure does not demonstrate hypermetabolic activity but is below sensitive PET-CT size thresholds. This may warrant surveillance. 3. Other imaging findings of potential clinical significance: Aortic Atherosclerosis (ICD10-I70.0). Coronary atherosclerosis. Asymmetric right degenerative glenohumeral arthropathy. Small calcified thyroid nodules are present but not hypermetabolic.      03/11/2017 -  Radiation Therapy    Concurrent Chemo Radiation with Dr. Lisbeth Renshaw      03/11/2017 -  Chemotherapy    Concurrent Chemo radiation with with mitomycin C and 5-fluorouracil.         CURRENT THERAPY:  Concurrent chemotherapoy and Radiation with mitomycin C on Day 1 and 29 and 5-fluorouracil on day 1-5, 29-33   INTERVAL HISTORY:   [name] is here for a follow up. She presents to the clinic today with her daughter. She reports she had radiation for her third week done today, She reports she is alright. She has mouth sores under control with magic mouthwash that did not really help so she used die She is starting to get rectal are irritation and on her buttocks. No skin breakdown or bleeding iwth BM. Her BM is soft and with gas but has not had on two days.  She has no incontinence.  The lab will not take blood from her PICC line.  She shared her concerns with her schedule so she doe snot run too far behind.    REVIEW OF SYSTEMS:     Constitutional: Denies fevers, chills or abnormal weight loss Eyes: Denies blurriness of vision Ears, nose, mouth, throat, and face: Denies mucositis or sore throat Respiratory: Denies cough, dyspnea or wheezes Cardiovascular: Denies palpitation, chest discomfort or lower extremity swelling Gastrointestinal:  Denies nausea, heartburn or change in bowel habits Skin: Denies abnormal skin rashes Lymphatics: Denies new lymphadenopathy or easy bruising Neurological:Denies numbness, tingling or new weaknesses Behavioral/Psych: Mood is stable, no new changes  All other systems were reviewed with the patient and are negative.   MEDICAL HISTORY:  Past Medical History:  Diagnosis Date  . Anemia    during childhood  . Arthritis    hands, shoulder  . Breast cancer ([place]) 03/2000   left side modified mastecomy  . Cancer [facility])    breast, age 86; left side  . Colon cancer ([place]) 02/11/2017   rectum-squamous cell carcinoma  . Depression   . Hypertension   . Squamous cell carcinoma of rectum ([place]) 02/14/2017    SURGICAL HISTORY: Past Surgical History:  Procedure Laterality Date  . ABDOMINAL HYSTERECTOMY     With bilateral oophorectomy. 2002.  Marland Kitchen BREAST BIOPSY Right 2004   benign  . BREAST SURGERY Left 2001   biopsy  . CATARACT EXTRACTION Bilateral 2017  . IR FLUORO GUIDE CV LINE RIGHT  03/11/2017  . IR US GUIDE VASC ACCESS RIGHT  03/11/2017  . MASTECTOMY MODIFIED RADICAL Left 2001  . MENISCUS DEBRIDEMENT Right 2003  . TUMOR EXCISION Right    thigh area    I have reviewed the social history and family history with the patient and they are unchanged from previous note.  ALLERGIES:  is allergic to ibuprofen.  MEDICATIONS:  Current Outpatient Prescriptions  Medication Sig Dispense Refill  . amLODipine (NORVASC) 5 MG tablet Take 1 tablet (5 mg total) by mouth daily. 90 tablet 3  . amphetamine-dextroamphetamine (ADDERALL) 10 MG tablet Take 1 tablet (10 mg total) by mouth 2 (two) times  daily. 60 tablet 0  . atorvastatin (LIPITOR) 80 MG tablet Take 0.5 tablets (40 mg total) by mouth daily at 6 PM. 45 tablet 3  . cholecalciferol (VITAMIN D) 400 units TABS tablet Take 400 Units by mouth daily.    Marland Kitchen losartan-hydrochlorothiazide (HYZAAR) 50-12.5 MG tablet Start with one half tablet daily for 1 week, then 1 full tablet daily (Patient taking differently: Take 1 tablet by mouth daily. Start with one half tablet daily for 1 week, then 1 full tablet daily) 90 tablet 3  . sertraline (ZOLOFT) 50 MG tablet Take 1 tablet (50 mg total) by mouth daily. 90 tablet 3  . magic mouthwash SOLN Swish and spit 5-10 mls by mouth four times daily as needed. (Patient not taking: Reported on 03/25/2017) 240 mL 1  . ondansetron (ZOFRAN) 8 MG tablet Take 1 tablet (8 mg total) by mouth every 8 (eight) hours as needed for nausea or vomiting. (Patient not taking: Reported on 03/25/2017) 20 tablet 0  . prochlorperazine (COMPAZINE) 10 MG tablet Take 1 tablet (10 mg total) by mouth every 6 (six) hours as needed for nausea or vomiting. (Patient not taking: Reported on 03/25/2017) 30 tablet 0   Current Facility-Administered Medications  Medication Dose Route Frequency Provider Last Rate Last Dose  . 0.9 %  sodium chloride  infusion  500 mL Intravenous Continuous [name], MD        PHYSICAL EXAMINATION: ECOG PERFORMANCE STATUS: 0 - Asymptomatic  Vitals:   03/25/17 1401  BP: 131/64  Pulse: 87  Resp: 17  Temp: 98 F (36.7 C)  SpO2: 100%   Filed Weights   03/25/17 1401  Weight: 131 lb 9.6 oz (59.7 kg)    GENERAL:alert, no distress and comfortable SKIN: skin color, texture, turgor are normal, no rashes or significant lesions EYES: normal, Conjunctiva are pink and non-injected, sclera clear OROPHARYNX:no exudate, no erythema and lips, buccal mucosa, and tongue normal  NECK: supple, thyroid normal size, non-tender, without nodularity LYMPH:  no palpable lymphadenopathy in the cervical, axillary or  inguinal LUNGS: clear to auscultation and percussion with normal breathing effort HEART: regular rate & rhythm and no murmurs and no lower extremity edema ABDOMEN:abdomen soft, non-tender and normal bowel sounds, No local megaly. Digital exam deferred today, PSA showed a 1.5 cm anterior low rectum mass. Musculoskeletal:no cyanosis of digits and no clubbing  NEURO: alert & oriented x 3 with fluent speech, no focal motor/sensory deficits Breast: (+) s/p mastectomy in left breast, Surgical scar well-healed. No palpable mass or adenopathy. Non-tender. Right breast exam normal.    LABORATORY DATA:  I have reviewed the data as listed CBC Latest Ref Rng & Units 03/25/2017 03/18/2017 03/11/2017  WBC 3.9 - 10.3 10e3/uL 1.5(L) 1.4(L) 4.4  Hemoglobin 11.6 - 15.9 g/dL 10.5(L) 11.2(L) 12.7  Hematocrit 34.8 - 46.6 % 30.2(L) 32.2(L) 36.8  Platelets 145 - 400 10e3/uL 201 271 375     CMP Latest Ref Rng & Units 03/25/2017 03/18/2017 03/11/2017  Glucose 70 - 140 mg/dl 91 75 90  BUN 7.0 - 26.0 mg/dL 14.9 19.1 16.3  Creatinine 0.6 - 1.1 mg/dL 0.7 0.7 0.7  Sodium 136 - 145 mEq/L 139 139 141  Potassium 3.5 - 5.1 mEq/L 3.7 4.0 3.8  Chloride 96 - 112 mEq/L - - -  CO2 22 - 29 mEq/L 29 27 27   Calcium 8.4 - 10.4 mg/dL 9.5 9.2 10.2  Total Protein 6.4 - 8.3 g/dL 6.5 6.3(L) 6.9  Total Bilirubin 0.20 - 1.20 mg/dL 0.38 0.53 0.63  Alkaline Phos 40 - 150 U/L 39(L) 41 42  AST 5 - 34 U/L 15 15 21   ALT 0 - 55 U/L 10 12 16    PATHOLOGY Diagnosis 02/11/17 1. Colon, biopsy, descending, polyp - TUBULAR ADENOMA (ONE FRAGMENT). - NO HIGH GRADE DYSPLASIA OR MALIGNANCY. 2. Colon, polyp(s), rectum, polyp - biopsy only - SQUAMOUS CELL CARCINOMA, BASALOID-TYPE. - SEE MICROSCOPIC DESCRIPTION. Microscopic Comment 2. There are multiple fragments of rectal mucosa which are involved by poorly differentiated squamous cell carcinoma with basaloid features.   PROCEDURES Colonoscopy 02/11/17 IMPRESSION - One 12 to 15 mm polyp in the  rectum (vs proximal anus). Biopsied. Firm, - just inside anal verge or on it? - One 2 mm polyp in the descending colon, removed with a cold biopsy forceps. Resected and retrieved. - The examination was otherwise normal on direct and retroflexion views.   RADIOGRAPHIC STUDIES: I have personally reviewed the radiological images as listed and agreed with the findings in the report. No results found.   PET 02/25/17 IMPRESSION: 1. Focal accentuated activity in the anus as detailed above, with bilateral external iliac hypermetabolic adenopathy indicating metastatic disease. No hypermetabolic lesion of the liver or other findings of metastatic disease. 2. The tiny nodule adjacent to the minor fissure does not demonstrate hypermetabolic activity but is  below sensitive PET-CT size thresholds. This may warrant surveillance. 3. Other imaging findings of potential clinical significance: Aortic Atherosclerosis (ICD10-I70.0). Coronary atherosclerosis. Asymmetric right degenerative glenohumeral arthropathy. Small calcified thyroid nodules are present but not hypermetabolic.  ASSESSMENT & PLAN:  Denise Wolfe is here for a follow up. She has a past medical history of breast cancer in 2001, Anemia, Arthritis and HTN.    1. Squamous Cell Carcinoma of Rectum, [id number] -She underwent a screening colonoscopy 02/11/2017. Findings included a 12-15 mm polyp in the rectum, semi-sessile. A 2 mm polyp was found in the descending colon. Pathology on the rectal polyp showed squamous cell carcinoma, basaloid type. The descending colon polyp showed tubular adenoma -CT scans of the chest/abdomen/pelvis 02/19/2017 showed an abnormal right pelvic sidewall lymph node measuring 1.9 x 1.6 cm -We discussed her PET from 02/25/17. The PET shows enlarged hypermetabolic bilateral external iliac lymph nodes (2), no distant metastasis.  -Patient had a question if her pelvic and inguinal metastasis from her previous breast cancer.  Were discussed this is unlikely, given the distribution of the lymph nodes which is more consistent with rectal cancer metastasis. Her previous breast cancer was diagnosed 17 years ago, hormonal sensitive, and she received a standard treatment. No evidence of recurrence since then. -Although she has locally advanced disease, this is still considered curable with concurrent chemotherapy and irradiation. -I recommend a standard chemotherapy with 5-FU and mitomycin regimen, starting today. -I recommend concurrent chemotherapy with mitomycin C and 5-fluorouracil. -This is week 3 of her chemo and radiation, she is doing well, tolerating treatment well. Her mucositis has resolved. Labs reviewed and her [place] is 0.5. Will check again to see if she needs to postpone radiation tomorrow. Her Hg is 10.6 but does not need blood transfusion. Will send note to Dr. Lisbeth Renshaw about [place], and repeat lab in 2 days, to see if she needs to hold radiation due to neutropenia. -Lab and follow-up weekly during chemotherapy treatment -She is scheduled to have second round chemotherapy on 04/08/2017, with PICC line placed by radiology in the morning.   2. Hx of left Breast Cancer, stage 3 -Diagnosed in 2001 -Had mastectomy and ALND, adjuvant chemo therapy and radiation, took tamoxifen and aromasin -Mother diagnosed at age 60. Declines referral to genetics counselor. -We discussed that it is not likely that breast cancer has occurred in her rectum. -I recommend genetic testing due to her family history and personal history of breast cancer. She will think about it. -She is overdue for her mammogram, I will order an screening mammogram after her current treatment. Breast exam was normal on 03/25/2017  3. Hypertension - currently on Norvasc and Hyzaar.  4. Depression - currently on Zoloft.  5. Hypercholesterolemia -on Lipitor.  6. Anemia and neutropenia -Second to chemotherapy, neutropenia precautions reviewed with patient  again. -Continue lab weekly -We'll decide if chemotherapy dose reduction on August 27 based on her nadir blood counts   7. Mucositis, secondary to chemotherapy -Resolved.  PLAN:  -Send note to Dr. Lisbeth Renshaw about her neutropenia and repeating lab in 2 days  -Lab on 8/15 morning  -Please move her lab from 1:15pm to 10am on 8/27, before her PICC line placement     No orders of the defined types were placed in this encounter.  All questions were answered. The patient knows to call the clinic with any problems, questions or concerns. No barriers to learning was detected.  I spent 25 minutes counseling the patient face to face. The total time  spent in the appointment was 30 minutes and more than 50% was on counseling and review of test results  This document serves as a record of services personally performed by Truitt Merle, MD. It was created on her behalf by Joslyn Devon, a trained medical scribe. The creation of this record is based on the scribe's personal observations and the provider's statements to them. This document has been checked and approved by the attending provider.      Truitt Merle, MD 03/25/2017

## 2017-03-25 ENCOUNTER — Ambulatory Visit
Admission: RE | Admit: 2017-03-25 | Discharge: 2017-03-25 | Disposition: A | Discharge status: Home / Self Care | Payer: Medicare Other | Source: Ambulatory Visit | Attending: Radiation Oncology | Admitting: Radiation Oncology

## 2017-03-25 ENCOUNTER — Ambulatory Visit (HOSPITAL_BASED_OUTPATIENT_CLINIC_OR_DEPARTMENT_OTHER): Payer: Medicare Other | Admitting: Hematology

## 2017-03-25 ENCOUNTER — Encounter: Payer: Self-pay | Admitting: Hematology

## 2017-03-25 ENCOUNTER — Other Ambulatory Visit (HOSPITAL_BASED_OUTPATIENT_CLINIC_OR_DEPARTMENT_OTHER): Payer: Medicare Other

## 2017-03-25 ENCOUNTER — Telehealth: Payer: Self-pay | Admitting: Hematology

## 2017-03-25 VITALS — BP 131/64 | HR 87 | Temp 98.0°F | Resp 17 | Ht 62.0 in | Wt 131.6 lb

## 2017-03-25 DIAGNOSIS — I10 Essential (primary) hypertension: Secondary | ICD-10-CM

## 2017-03-25 DIAGNOSIS — C778 Secondary and unspecified malignant neoplasm of lymph nodes of multiple regions: Secondary | ICD-10-CM

## 2017-03-25 DIAGNOSIS — C20 Malignant neoplasm of rectum: Secondary | ICD-10-CM | POA: Diagnosis not present

## 2017-03-25 DIAGNOSIS — Z853 Personal history of malignant neoplasm of breast: Secondary | ICD-10-CM

## 2017-03-25 DIAGNOSIS — F329 Major depressive disorder, single episode, unspecified: Secondary | ICD-10-CM | POA: Diagnosis not present

## 2017-03-25 DIAGNOSIS — D6481 Anemia due to antineoplastic chemotherapy: Secondary | ICD-10-CM | POA: Diagnosis not present

## 2017-03-25 DIAGNOSIS — Z923 Personal history of irradiation: Secondary | ICD-10-CM | POA: Diagnosis not present

## 2017-03-25 DIAGNOSIS — D701 Agranulocytosis secondary to cancer chemotherapy: Secondary | ICD-10-CM | POA: Diagnosis not present

## 2017-03-25 DIAGNOSIS — C211 Malignant neoplasm of anal canal: Secondary | ICD-10-CM | POA: Diagnosis not present

## 2017-03-25 DIAGNOSIS — C801 Malignant (primary) neoplasm, unspecified: Secondary | ICD-10-CM

## 2017-03-25 DIAGNOSIS — Z510 Encounter for antineoplastic radiation therapy: Secondary | ICD-10-CM | POA: Diagnosis not present

## 2017-03-25 DIAGNOSIS — C218 Malignant neoplasm of overlapping sites of rectum, anus and anal canal: Secondary | ICD-10-CM | POA: Diagnosis not present

## 2017-03-25 LAB — CBC WITH DIFFERENTIAL/PLATELET
BASO%: 0.5 % (ref 0.0–2.0)
BASOS ABS: 0 10*3/uL (ref 0.0–0.1)
EOS ABS: 0.2 10*3/uL (ref 0.0–0.5)
EOS%: 12.9 % — ABNORMAL HIGH (ref 0.0–7.0)
HCT: 30.2 % — ABNORMAL LOW (ref 34.8–46.6)
HGB: 10.5 g/dL — ABNORMAL LOW (ref 11.6–15.9)
LYMPH%: 24.6 % (ref 14.0–49.7)
MCH: 32 pg (ref 25.1–34.0)
MCHC: 34.9 g/dL (ref 31.5–36.0)
MCV: 91.7 fL (ref 79.5–101.0)
MONO#: 0.4 10*3/uL (ref 0.1–0.9)
MONO%: 27.2 % — ABNORMAL HIGH (ref 0.0–14.0)
NEUT#: 0.5 10*3/uL — CL (ref 1.5–6.5)
NEUT%: 34.8 % — AB (ref 38.4–76.8)
PLATELETS: 201 10*3/uL (ref 145–400)
RBC: 3.29 10*6/uL — ABNORMAL LOW (ref 3.70–5.45)
RDW: 13.3 % (ref 11.2–14.5)
WBC: 1.5 10*3/uL — ABNORMAL LOW (ref 3.9–10.3)
lymph#: 0.4 10*3/uL — ABNORMAL LOW (ref 0.9–3.3)

## 2017-03-25 LAB — COMPREHENSIVE METABOLIC PANEL
ALT: 10 U/L (ref 0–55)
ANION GAP: 7 meq/L (ref 3–11)
AST: 15 U/L (ref 5–34)
Albumin: 3.7 g/dL (ref 3.5–5.0)
Alkaline Phosphatase: 39 U/L — ABNORMAL LOW (ref 40–150)
BUN: 14.9 mg/dL (ref 7.0–26.0)
CALCIUM: 9.5 mg/dL (ref 8.4–10.4)
CO2: 29 meq/L (ref 22–29)
Chloride: 103 mEq/L (ref 98–109)
Creatinine: 0.7 mg/dL (ref 0.6–1.1)
EGFR: 84 mL/min/{1.73_m2} — ABNORMAL LOW (ref >90)
Glucose: 91 mg/dl (ref 70–140)
POTASSIUM: 3.7 meq/L (ref 3.5–5.1)
SODIUM: 139 meq/L (ref 136–145)
Total Bilirubin: 0.38 mg/dL (ref 0.20–1.20)
Total Protein: 6.5 g/dL (ref 6.4–8.3)

## 2017-03-25 NOTE — Telephone Encounter (Signed)
Scheduled appt per 8/13 los - patient my chart active did not want calender or AVS

## 2017-03-26 ENCOUNTER — Ambulatory Visit: Admission: RE | Admit: 2017-03-26 | Payer: Medicare Other | Source: Ambulatory Visit

## 2017-03-26 ENCOUNTER — Ambulatory Visit
Admission: RE | Admit: 2017-03-26 | Discharge: 2017-03-26 | Disposition: A | Discharge status: Home / Self Care | Payer: Medicare Other | Source: Ambulatory Visit | Attending: Radiation Oncology | Admitting: Radiation Oncology

## 2017-03-26 DIAGNOSIS — Z510 Encounter for antineoplastic radiation therapy: Secondary | ICD-10-CM | POA: Diagnosis not present

## 2017-03-26 DIAGNOSIS — I10 Essential (primary) hypertension: Secondary | ICD-10-CM | POA: Diagnosis not present

## 2017-03-26 DIAGNOSIS — Z923 Personal history of irradiation: Secondary | ICD-10-CM | POA: Diagnosis not present

## 2017-03-26 DIAGNOSIS — F329 Major depressive disorder, single episode, unspecified: Secondary | ICD-10-CM | POA: Diagnosis not present

## 2017-03-26 DIAGNOSIS — C218 Malignant neoplasm of overlapping sites of rectum, anus and anal canal: Secondary | ICD-10-CM | POA: Diagnosis not present

## 2017-03-26 DIAGNOSIS — Z853 Personal history of malignant neoplasm of breast: Secondary | ICD-10-CM | POA: Diagnosis not present

## 2017-03-26 DIAGNOSIS — C211 Malignant neoplasm of anal canal: Secondary | ICD-10-CM | POA: Diagnosis not present

## 2017-03-27 ENCOUNTER — Other Ambulatory Visit (HOSPITAL_BASED_OUTPATIENT_CLINIC_OR_DEPARTMENT_OTHER): Payer: Medicare Other

## 2017-03-27 ENCOUNTER — Telehealth: Payer: Self-pay | Admitting: *Deleted

## 2017-03-27 ENCOUNTER — Telehealth: Payer: Self-pay | Admitting: Hematology

## 2017-03-27 ENCOUNTER — Ambulatory Visit: Admission: RE | Admit: 2017-03-27 | Payer: Medicare Other | Source: Ambulatory Visit

## 2017-03-27 DIAGNOSIS — C778 Secondary and unspecified malignant neoplasm of lymph nodes of multiple regions: Secondary | ICD-10-CM | POA: Diagnosis not present

## 2017-03-27 DIAGNOSIS — C20 Malignant neoplasm of rectum: Secondary | ICD-10-CM | POA: Diagnosis present

## 2017-03-27 LAB — CBC WITH DIFFERENTIAL/PLATELET
BASO%: 3.1 % — AB (ref 0.0–2.0)
Basophils Absolute: 0 10*3/uL (ref 0.0–0.1)
EOS%: 15.5 % — AB (ref 0.0–7.0)
Eosinophils Absolute: 0.2 10*3/uL (ref 0.0–0.5)
HEMATOCRIT: 28.7 % — AB (ref 34.8–46.6)
HGB: 10.1 g/dL — ABNORMAL LOW (ref 11.6–15.9)
LYMPH%: 28.7 % (ref 14.0–49.7)
MCH: 31.8 pg (ref 25.1–34.0)
MCHC: 35.2 g/dL (ref 31.5–36.0)
MCV: 90.3 fL (ref 79.5–101.0)
MONO#: 0.4 10*3/uL (ref 0.1–0.9)
MONO%: 28.7 % — ABNORMAL HIGH (ref 0.0–14.0)
NEUT#: 0.3 10*3/uL — CL (ref 1.5–6.5)
NEUT%: 24 % — AB (ref 38.4–76.8)
Platelets: 151 10*3/uL (ref 145–400)
RBC: 3.18 10*6/uL — ABNORMAL LOW (ref 3.70–5.45)
RDW: 12.7 % (ref 11.2–14.5)
WBC: 1.3 10*3/uL — ABNORMAL LOW (ref 3.9–10.3)
lymph#: 0.4 10*3/uL — ABNORMAL LOW (ref 0.9–3.3)
nRBC: 0 % (ref 0–0)

## 2017-03-27 LAB — COMPREHENSIVE METABOLIC PANEL
ALT: 10 U/L (ref 0–55)
ANION GAP: 7 meq/L (ref 3–11)
AST: 14 U/L (ref 5–34)
Albumin: 3.5 g/dL (ref 3.5–5.0)
Alkaline Phosphatase: 38 U/L — ABNORMAL LOW (ref 40–150)
BILIRUBIN TOTAL: 0.3 mg/dL (ref 0.20–1.20)
BUN: 19.7 mg/dL (ref 7.0–26.0)
CALCIUM: 9.3 mg/dL (ref 8.4–10.4)
CHLORIDE: 103 meq/L (ref 98–109)
CO2: 28 meq/L (ref 22–29)
CREATININE: 0.7 mg/dL (ref 0.6–1.1)
EGFR: 82 mL/min/{1.73_m2} — AB (ref >90)
Glucose: 92 mg/dl (ref 70–140)
Potassium: 4.1 mEq/L (ref 3.5–5.1)
SODIUM: 138 meq/L (ref 136–145)
TOTAL PROTEIN: 6.3 g/dL — AB (ref 6.4–8.3)

## 2017-03-27 NOTE — Telephone Encounter (Signed)
lvm to inform pt of lab/inj appts 8/16 and 8/17 per sch msg

## 2017-03-27 NOTE — Telephone Encounter (Signed)
Gave patient mask for immune sytem low, radiation treatment was cancelled today,  and called and spoke with daughter per Dr. Burr Medico, patient has labs Tomorrow, Friday and Monday at 1030am, before rad tx, we will check results to see if patient can have radiation those days,  Patient knows to be at [facility] tomorrow at 10:15 am for 10:30 am lab, thanked thi Rn for calling so soon 11:14 AM ,

## 2017-03-27 NOTE — Addendum Note (Signed)
Addended by: Truitt Merle on: 03/27/2017 11:41 AM   Modules accepted: Orders

## 2017-03-27 NOTE — Telephone Encounter (Signed)
Spoke with pt and informed pt re:  Per Dr. Burr Medico, pt will have repeated lab for next 2 days.  However, if [place] still low on Friday 03/29/17, and pt will not be able to take radiation, then pt will receive Granix injection.   Pt voiced understanding.

## 2017-03-28 ENCOUNTER — Ambulatory Visit: Admission: RE | Admit: 2017-03-28 | Payer: Medicare Other | Source: Ambulatory Visit

## 2017-03-28 ENCOUNTER — Other Ambulatory Visit (HOSPITAL_BASED_OUTPATIENT_CLINIC_OR_DEPARTMENT_OTHER): Payer: Medicare Other

## 2017-03-28 DIAGNOSIS — C20 Malignant neoplasm of rectum: Secondary | ICD-10-CM | POA: Diagnosis present

## 2017-03-28 DIAGNOSIS — C778 Secondary and unspecified malignant neoplasm of lymph nodes of multiple regions: Secondary | ICD-10-CM

## 2017-03-28 LAB — CBC WITH DIFFERENTIAL/PLATELET
BASO%: 2.6 % — AB (ref 0.0–2.0)
Basophils Absolute: 0 10*3/uL (ref 0.0–0.1)
EOS%: 15.7 % — ABNORMAL HIGH (ref 0.0–7.0)
Eosinophils Absolute: 0.2 10*3/uL (ref 0.0–0.5)
HEMATOCRIT: 30.3 % — AB (ref 34.8–46.6)
HEMOGLOBIN: 10.6 g/dL — AB (ref 11.6–15.9)
LYMPH#: 0.3 10*3/uL — AB (ref 0.9–3.3)
LYMPH%: 25.8 % (ref 14.0–49.7)
MCH: 32.6 pg (ref 25.1–34.0)
MCHC: 35.1 g/dL (ref 31.5–36.0)
MCV: 92.9 fL (ref 79.5–101.0)
MONO#: 0.5 10*3/uL (ref 0.1–0.9)
MONO%: 35 % — ABNORMAL HIGH (ref 0.0–14.0)
NEUT#: 0.3 10*3/uL — CL (ref 1.5–6.5)
NEUT%: 20.9 % — AB (ref 38.4–76.8)
PLATELETS: 183 10*3/uL (ref 145–400)
RBC: 3.26 10*6/uL — ABNORMAL LOW (ref 3.70–5.45)
RDW: 13.4 % (ref 11.2–14.5)
WBC: 1.3 10*3/uL — ABNORMAL LOW (ref 3.9–10.3)

## 2017-03-28 NOTE — Progress Notes (Signed)
[place]  Telephone:([phone]) [phone] Fax:(336) [phone]  Clinic Follow up Note   Patient Care Team: [name], DO as PCP - General (Family Medicine) Silverio Decamp, MD as Consulting Physician (Family Medicine) 04/01/2017  SUMMARY OF ONCOLOGIC HISTORY:   Squamous cell carcinoma of rectum ([place])   02/11/2017 Procedure    Colonoscopy 02/11/17 IMPRESSION - One 12 to 15 mm polyp in the rectum (vs proximal anus). Biopsied. Firm, - just inside anal verge or on it? - One 2 mm polyp in the descending colon, removed with a cold biopsy forceps. Resected and retrieved. - The examination was otherwise normal on direct and retroflexion views.       02/11/2017 Pathology Results    Diagnosis 02/11/17 1. Colon, biopsy, descending, polyp - TUBULAR ADENOMA (ONE FRAGMENT). - NO HIGH GRADE DYSPLASIA OR MALIGNANCY. 2. Colon, polyp(s), rectum, polyp - biopsy only - SQUAMOUS CELL CARCINOMA, BASALOID-TYPE. - SEE MICROSCOPIC DESCRIPTION. Microscopic Comment 2. There are multiple fragments of rectal mucosa which are involved by poorly differentiated squamous cell carcinoma with basaloid features.      02/14/2017 Initial Diagnosis    Squamous cell carcinoma of rectum ([place])     02/20/2017 Imaging    CT CAP W Contrast 02/20/17 IMPRESSION: 1. Abnormal right pelvic sidewall lymph node measuring 1.9 x 1.6 cm. Imaging features highly suspicious for metastatic disease. PET-CT may prove helpful to further evaluate. Borderline lymph node identified at the right iliac bifurcation and an upper normal size lymph node is identified along the left external iliac artery chain. 2. Asymmetric wall thickening distal rectum, presumably the location of primary neoplasm. 3. Asymmetric airspace opacity in the left apex has a wedge shaped configuration on coronal imaging. This may be an area of scarring or potentially infectious/inflammatory etiology. Attention on follow-up recommended. 4. Tiny  right perifissural nodule in the right middle lobe, likely subpleural lymph node but attention on follow-up recommended. 5. Multiple low-density well-defined liver lesions, most suggestive of cysts. 6.  Aortic Atherosclerois (ICD10-170.0)      02/25/2017 PET scan    PET 02/25/17 IMPRESSION: 1. Focal accentuated activity in the anus as detailed above, with bilateral external iliac hypermetabolic adenopathy indicating metastatic disease. No hypermetabolic lesion of the liver or other findings of metastatic disease. 2. The tiny nodule adjacent to the minor fissure does not demonstrate hypermetabolic activity but is below sensitive PET-CT size thresholds. This may warrant surveillance. 3. Other imaging findings of potential clinical significance: Aortic Atherosclerosis (ICD10-I70.0). Coronary atherosclerosis. Asymmetric right degenerative glenohumeral arthropathy. Small calcified thyroid nodules are present but not hypermetabolic.      03/11/2017 -  Radiation Therapy    Concurrent Chemo Radiation with Dr. Lisbeth Renshaw      03/11/2017 -  Chemotherapy    Concurrent Chemo radiation with with mitomycin C and 5-fluorouracil.         CURRENT THERAPY:  Concurrent chemotherapoy and Radiation with mitomycin C on Day 1 and 29 and 5-fluorouracil on day 1-5, 29-33   INTERVAL HISTORY:   Denise Wolfe is here for a follow up and accompanied by her daughter. She is very frustrated today, believing something should have been done prophylactically to keep her WBC from falling, ultimately causing her radiation and chemotherapy to be postponed. She feels as though her care team is not communicating well with each other. I spoke with the patient about medication to stabilize WBC is not covered by insurance prophylactically. I assured the patient that I am in constant email communication with her care  team. Labs reviewed. We discussed that she will continue with chemotherapy and radiation treatment.   She denies  diarrhea or rectal bleeding. She actually was running a little constipated. She did not take anything for this and instead "waited it out". She reports she felt sick yesterday which was most likely stress related. She reports fatigue. She denies fever. Appetite is good and she is drinking fluids well. Her weight is stable. She has Magic Mouthwash at home to use as needed. She does not need any refills at this time.    REVIEW OF SYSTEMS:   Constitutional: Denies fevers, chills or abnormal weight loss (+) fatigue Eyes: Denies blurriness of vision Ears, nose, mouth, throat, and face: Denies mucositis or sore throat Respiratory: Denies cough, dyspnea or wheezes Cardiovascular: Denies palpitation, chest discomfort or lower extremity swelling Gastrointestinal:  Denies nausea, heartburn or change in bowel habits Skin: Denies abnormal skin rashes Lymphatics: Denies new lymphadenopathy or easy bruising Neurological:Denies numbness, tingling or new weaknesses Behavioral/Psych: Mood is stable, no new changes  All other systems were reviewed with the patient and are negative.   MEDICAL HISTORY:  Past Medical History:  Diagnosis Date  . Anemia    during childhood  . Arthritis    hands, shoulder  . Breast cancer ([place]) 03/2000   left side modified mastecomy  . Cancer [facility])    breast, age 65; left side  . Colon cancer ([place]) 02/11/2017   rectum-squamous cell carcinoma  . Depression   . Hypertension   . Squamous cell carcinoma of rectum ([place]) 02/14/2017    SURGICAL HISTORY: Past Surgical History:  Procedure Laterality Date  . ABDOMINAL HYSTERECTOMY     With bilateral oophorectomy. 2002.  Marland Kitchen BREAST BIOPSY Right 2004   benign  . BREAST SURGERY Left 2001   biopsy  . CATARACT EXTRACTION Bilateral 2017  . IR FLUORO GUIDE CV LINE RIGHT  03/11/2017  . IR US GUIDE VASC ACCESS RIGHT  03/11/2017  . MASTECTOMY MODIFIED RADICAL Left 2001  . MENISCUS DEBRIDEMENT Right 2003  . TUMOR EXCISION Right     thigh area    I have reviewed the social history and family history with the patient and they are unchanged from previous note.  ALLERGIES:  is allergic to ibuprofen.  MEDICATIONS:  Current Outpatient Prescriptions  Medication Sig Dispense Refill  . amLODipine (NORVASC) 5 MG tablet Take 1 tablet (5 mg total) by mouth daily. 90 tablet 3  . amphetamine-dextroamphetamine (ADDERALL) 10 MG tablet Take 1 tablet (10 mg total) by mouth 2 (two) times daily. 60 tablet 0  . atorvastatin (LIPITOR) 80 MG tablet Take 0.5 tablets (40 mg total) by mouth daily at 6 PM. 45 tablet 3  . cholecalciferol (VITAMIN D) 400 units TABS tablet Take 400 Units by mouth daily.    Marland Kitchen losartan-hydrochlorothiazide (HYZAAR) 50-12.5 MG tablet Start with one half tablet daily for 1 week, then 1 full tablet daily (Patient taking differently: Take 1 tablet by mouth daily. Start with one half tablet daily for 1 week, then 1 full tablet daily) 90 tablet 3  . magic mouthwash SOLN Swish and spit 5-10 mls by mouth four times daily as needed. (Patient not taking: Reported on 03/25/2017) 240 mL 1  . ondansetron (ZOFRAN) 8 MG tablet Take 1 tablet (8 mg total) by mouth every 8 (eight) hours as needed for nausea or vomiting. (Patient not taking: Reported on 03/25/2017) 20 tablet 0  . prochlorperazine (COMPAZINE) 10 MG tablet Take 1 tablet (10 mg total) by  mouth every 6 (six) hours as needed for nausea or vomiting. (Patient not taking: Reported on 03/25/2017) 30 tablet 0  . sertraline (ZOLOFT) 50 MG tablet Take 1 tablet (50 mg total) by mouth daily. 90 tablet 3   Current Facility-Administered Medications  Medication Dose Route Frequency Provider Last Rate Last Dose  . 0.9 %  sodium chloride infusion  500 mL Intravenous Continuous [name], MD        PHYSICAL EXAMINATION: ECOG PERFORMANCE STATUS: 1  Vitals:   04/01/17 1344  BP: (!) 120/58  Pulse: 88  Resp: 20  Temp: 98.3 F (36.8 C)  SpO2: 100%   Filed Weights   04/01/17 1344   Weight: 131 lb 14.4 oz (59.8 kg)    GENERAL:alert, no distress and comfortable SKIN: skin color, texture, turgor are normal, no rashes or significant lesions EYES: normal, Conjunctiva are pink and non-injected, sclera clear OROPHARYNX:no exudate, no erythema and lips, buccal mucosa, and tongue normal  NECK: supple, thyroid normal size, non-tender, without nodularity LYMPH:  no palpable lymphadenopathy in the cervical, axillary or inguinal LUNGS: clear to auscultation and percussion with normal breathing effort HEART: regular rate & rhythm and no murmurs and no lower extremity edema ABDOMEN:abdomen soft, non-tender and normal bowel sounds, No local megaly. Digital exam deferred today, scan showed a 1.5 cm anterior low rectum mass.  Musculoskeletal:no cyanosis of digits and no clubbing  NEURO: alert & oriented x 3 with fluent speech, no focal motor/sensory deficits   LABORATORY DATA:  I have reviewed the data as listed CBC Latest Ref Rng & Units 04/01/2017 03/29/2017 03/28/2017  WBC 3.9 - 10.3 10e3/uL 8.2 1.3(L) 1.3(L)  Hemoglobin 11.6 - 15.9 g/dL 10.4(L) 10.5(L) 10.6(L)  Hematocrit 34.8 - 46.6 % 29.5(L) 30.1(L) 30.3(L)  Platelets 145 - 400 10e3/uL 190 197 183     CMP Latest Ref Rng & Units 04/01/2017 03/29/2017 03/27/2017  Glucose 70 - 140 mg/dl 90 84 92  BUN 7.0 - 26.0 mg/dL 15.7 21.6 19.7  Creatinine 0.6 - 1.1 mg/dL 0.7 0.7 0.7  Sodium 136 - 145 mEq/L 139 138 138  Potassium 3.5 - 5.1 mEq/L 3.7 4.1 4.1  Chloride 96 - 112 mEq/L - - -  CO2 22 - 29 mEq/L 29 29 28   Calcium 8.4 - 10.4 mg/dL 9.3 9.4 9.3  Total Protein 6.4 - 8.3 g/dL 6.4 6.3(L) 6.3(L)  Total Bilirubin 0.20 - 1.20 mg/dL 0.29 0.29 0.30  Alkaline Phos 40 - 150 U/L 58 39(L) 38(L)  AST 5 - 34 U/L 16 14 14   ALT 0 - 55 U/L 12 10 10    PATHOLOGY Diagnosis 02/11/17 1. Colon, biopsy, descending, polyp - TUBULAR ADENOMA (ONE FRAGMENT). - NO HIGH GRADE DYSPLASIA OR MALIGNANCY. 2. Colon, polyp(s), rectum, polyp - biopsy only -  SQUAMOUS CELL CARCINOMA, BASALOID-TYPE. - SEE MICROSCOPIC DESCRIPTION. Microscopic Comment 2. There are multiple fragments of rectal mucosa which are involved by poorly differentiated squamous cell carcinoma with basaloid features.   PROCEDURES Colonoscopy 02/11/17 IMPRESSION - One 12 to 15 mm polyp in the rectum (vs proximal anus). Biopsied. Firm, - just inside anal verge or on it? - One 2 mm polyp in the descending colon, removed with a cold biopsy forceps. Resected and retrieved. - The examination was otherwise normal on direct and retroflexion views.   RADIOGRAPHIC STUDIES: I have personally reviewed the radiological images as listed and agreed with the findings in the report. No results found.   PET 02/25/17 IMPRESSION: 1. Focal accentuated activity in the anus  as detailed above, with bilateral external iliac hypermetabolic adenopathy indicating metastatic disease. No hypermetabolic lesion of the liver or other findings of metastatic disease. 2. The tiny nodule adjacent to the minor fissure does not demonstrate hypermetabolic activity but is below sensitive PET-CT size thresholds. This may warrant surveillance. 3. Other imaging findings of potential clinical significance: Aortic Atherosclerosis (ICD10-I70.0). Coronary atherosclerosis. Asymmetric right degenerative glenohumeral arthropathy. Small calcified thyroid nodules are present but not hypermetabolic.  ASSESSMENT & PLAN:  Denise Wolfe is here for a follow up. She has a past medical history of breast cancer in 2001, Anemia, Arthritis and HTN.    1. Squamous Cell Carcinoma of Rectum, [id number] -She underwent a screening colonoscopy 02/11/2017. Findings included a 12-15 mm polyp in the rectum, semi-sessile. A 2 mm polyp was found in the descending colon. Pathology on the rectal polyp showed squamous cell carcinoma, basaloid type. The descending colon polyp showed tubular adenoma -CT scans of the chest/abdomen/pelvis  02/19/2017 showed an abnormal right pelvic sidewall lymph node measuring 1.9 x 1.6 cm -We discussed her PET from 02/25/17. The PET shows enlarged hypermetabolic bilateral external iliac lymph nodes (2), no distant metastasis.  -Patient had a question if her pelvic and inguinal metastasis from her previous breast cancer. Were discussed this is unlikely, given the distribution of the lymph nodes which is more consistent with rectal cancer metastasis. Her previous breast cancer was diagnosed 17 years ago, hormonal sensitive, and she received a standard treatment. No evidence of recurrence since then. -Although she has locally advanced disease, this is still considered curable with concurrent chemotherapy and irradiation. -I recommend a standard chemotherapy with 5-FU and mitomycin regimen, with concurrent radiation  -she tolerated the first week chemo well, he did develop severe neutropenia, and radiation was held for 3 days last week. -Lab reviewed, her neutropenia has resolved after 2 granix injections -This is week 4 of her chemo and radiation, she is doing well, tolerating treatment well. Labs reviewed, her counts are much better this week. Her ANC is 6.1. Her Hgb is 10.4. She will continue with treatment today. Blood counts will be checked again on 04/04/2017. -Patient feels very frustrated that her radiation was held last week due to neutropenia, I explained to her that this is more for safety issue, and will not compromise her cancer treatment outcome much  -if her blood counts are adequate, will plan to start second cycle chemo next Monday 8/27   2. Hx of left Breast Cancer, stage 3 -Diagnosed in 2001 -Had mastectomy and ALND, adjuvant chemo therapy and radiation, took tamoxifen and aromasin -Mother diagnosed at age 79. Declines referral to genetics counselor. -We discussed that it is not likely that breast cancer has occurred in her rectum. -I recommend genetic testing due to her family history  and personal history of breast cancer. She will think about it. -She is overdue for her mammogram, I will order an screening mammogram after her current treatment. Breast exam was normal on 03/25/2017  3. Hypertension - currently on Norvasc and Hyzaar.  4. Depression - currently on Zoloft.  5. Hypercholesterolemia -on Lipitor.  6. Anemia and neutropenia -Second to chemotherapy, neutropenia precautions reviewed with patient again. -Continue lab weekly -We'll decide if chemotherapy dose reduction on August 27 based on her nadir blood counts   7. Mucositis, secondary to chemotherapy -Resolved.  PLAN:  - Labs reviewed. Her counts are much better this week. She will continue with treatment.  Blood counts will be checked again on 04/04/2017. - She  is scheduled to see Ned Card NP on 04/08/2017 before cycle 2 chemo. - I will see her again on 04/17/2017.    No orders of the defined types were placed in this encounter.  All questions were answered. The patient knows to call the clinic with any problems, questions or concerns. No barriers to learning was detected.  I spent 25 minutes counseling the patient face to face. The total time spent in the appointment was 30 minutes and more than 50% was on counseling and review of test results  This document serves as a record of services personally performed by Truitt Merle, MD. It was created on her behalf by Arlyce Harman, a trained medical scribe. The creation of this record is based on the scribe's personal observations and the provider's statements to them. This document has been checked and approved by the attending provider.    Truitt Merle, MD 04/01/2017    Addendum Her [place] dropped again to 1.0 on 04/04/2017, will repeat lab on Monday morning 9:30am, if ANC<1.5 will postpone chemo to 9/4. I called pt and spoke with her daughter, she agrees.  Truitt Merle  04/06/2017

## 2017-03-29 ENCOUNTER — Encounter: Payer: Self-pay | Admitting: *Deleted

## 2017-03-29 ENCOUNTER — Ambulatory Visit (HOSPITAL_BASED_OUTPATIENT_CLINIC_OR_DEPARTMENT_OTHER): Payer: Medicare Other

## 2017-03-29 ENCOUNTER — Ambulatory Visit: Payer: Medicare Other

## 2017-03-29 ENCOUNTER — Other Ambulatory Visit (HOSPITAL_BASED_OUTPATIENT_CLINIC_OR_DEPARTMENT_OTHER): Payer: Medicare Other

## 2017-03-29 VITALS — BP 113/58 | HR 90 | Temp 98.0°F | Resp 18

## 2017-03-29 DIAGNOSIS — C20 Malignant neoplasm of rectum: Secondary | ICD-10-CM

## 2017-03-29 DIAGNOSIS — D701 Agranulocytosis secondary to cancer chemotherapy: Secondary | ICD-10-CM | POA: Diagnosis present

## 2017-03-29 DIAGNOSIS — C778 Secondary and unspecified malignant neoplasm of lymph nodes of multiple regions: Secondary | ICD-10-CM

## 2017-03-29 LAB — COMPREHENSIVE METABOLIC PANEL
ALBUMIN: 3.6 g/dL (ref 3.5–5.0)
ALT: 10 U/L (ref 0–55)
ANION GAP: 5 meq/L (ref 3–11)
AST: 14 U/L (ref 5–34)
Alkaline Phosphatase: 39 U/L — ABNORMAL LOW (ref 40–150)
BILIRUBIN TOTAL: 0.29 mg/dL (ref 0.20–1.20)
BUN: 21.6 mg/dL (ref 7.0–26.0)
CALCIUM: 9.4 mg/dL (ref 8.4–10.4)
CO2: 29 meq/L (ref 22–29)
CREATININE: 0.7 mg/dL (ref 0.6–1.1)
Chloride: 103 mEq/L (ref 98–109)
EGFR: 86 mL/min/{1.73_m2} — ABNORMAL LOW (ref >90)
Glucose: 84 mg/dl (ref 70–140)
Potassium: 4.1 mEq/L (ref 3.5–5.1)
SODIUM: 138 meq/L (ref 136–145)
TOTAL PROTEIN: 6.3 g/dL — AB (ref 6.4–8.3)

## 2017-03-29 LAB — CBC WITH DIFFERENTIAL/PLATELET
BASO%: 3.7 % — AB (ref 0.0–2.0)
Basophils Absolute: 0.1 10*3/uL (ref 0.0–0.1)
EOS ABS: 0.2 10*3/uL (ref 0.0–0.5)
EOS%: 13.2 % — ABNORMAL HIGH (ref 0.0–7.0)
HCT: 30.1 % — ABNORMAL LOW (ref 34.8–46.6)
HEMOGLOBIN: 10.5 g/dL — AB (ref 11.6–15.9)
LYMPH%: 25.1 % (ref 14.0–49.7)
MCH: 32.4 pg (ref 25.1–34.0)
MCHC: 35 g/dL (ref 31.5–36.0)
MCV: 92.6 fL (ref 79.5–101.0)
MONO#: 0.5 10*3/uL (ref 0.1–0.9)
MONO%: 39.6 % — AB (ref 0.0–14.0)
NEUT%: 18.4 % — ABNORMAL LOW (ref 38.4–76.8)
NEUTROS ABS: 0.2 10*3/uL — AB (ref 1.5–6.5)
Platelets: 197 10*3/uL (ref 145–400)
RBC: 3.25 10*6/uL — ABNORMAL LOW (ref 3.70–5.45)
RDW: 13.6 % (ref 11.2–14.5)
WBC: 1.3 10*3/uL — AB (ref 3.9–10.3)
lymph#: 0.3 10*3/uL — ABNORMAL LOW (ref 0.9–3.3)

## 2017-03-29 MED ORDER — TBO-FILGRASTIM 480 MCG/0.8ML ~~LOC~~ SOSY
480.0000 ug | PREFILLED_SYRINGE | Freq: Once | SUBCUTANEOUS | Status: AC
  Administered 2017-03-29: 480 ug via SUBCUTANEOUS
  Filled 2017-03-29: qty 0.8

## 2017-03-29 NOTE — Progress Notes (Signed)
No radiation treatment today  ANC=0.2, down from 0.3 yesterday, will recheck labs Monday   Before any radiation can be done, MD aware 11:09 AM

## 2017-03-29 NOTE — Patient Instructions (Signed)
Tbo-Filgrastim injection What is this medicine? TBO-FILGRASTIM (T B O fil GRA stim) is a granulocyte colony-stimulating factor that stimulates the growth of neutrophils, a type of white blood cell important in the body's fight against infection. It is used to reduce the incidence of fever and infection in patients with certain types of cancer who are receiving chemotherapy that affects the bone marrow. This medicine may be used for other purposes; ask your health care provider or pharmacist if you have questions. COMMON BRAND NAME(S): Granix What should I tell my health care provider before I take this medicine? They need to know if you have any of these conditions: -bone scan or tests planned -kidney disease -sickle cell anemia -an unusual or allergic reaction to tbo-filgrastim, filgrastim, pegfilgrastim, other medicines, foods, dyes, or preservatives -pregnant or trying to get pregnant -breast-feeding How should I use this medicine? This medicine is for injection under the skin. If you get this medicine at home, you will be taught how to prepare and give this medicine. Refer to the Instructions for Use that come with your medication packaging. Use exactly as directed. Take your medicine at regular intervals. Do not take your medicine more often than directed. It is important that you put your used needles and syringes in a special sharps container. Do not put them in a trash can. If you do not have a sharps container, call your pharmacist or healthcare provider to get one. Talk to your pediatrician regarding the use of this medicine in children. Special care may be needed. Overdosage: If you think you have taken too much of this medicine contact a poison control center or emergency room at once. NOTE: This medicine is only for you. Do not share this medicine with others. What if I miss a dose? It is important not to miss your dose. Call your doctor or health care professional if you miss a  dose. What may interact with this medicine? This medicine may interact with the following medications: -medicines that may cause a release of neutrophils, such as lithium This list may not describe all possible interactions. Give your health care provider a list of all the medicines, herbs, non-prescription drugs, or dietary supplements you use. Also tell them if you smoke, drink alcohol, or use illegal drugs. Some items may interact with your medicine. What should I watch for while using this medicine? You may need blood work done while you are taking this medicine. What side effects may I notice from receiving this medicine? Side effects that you should report to your doctor or health care professional as soon as possible: -allergic reactions like skin rash, itching or hives, swelling of the face, lips, or tongue -blood in the urine -dark urine -dizziness -fast heartbeat -feeling faint -shortness of breath or breathing problems -signs and symptoms of infection like fever or chills; cough; or sore throat -signs and symptoms of kidney injury like trouble passing urine or change in the amount of urine -stomach or side pain, or pain at the shoulder -sweating -swelling of the legs, ankles, or abdomen -tiredness Side effects that usually do not require medical attention (report to your doctor or health care professional if they continue or are bothersome): -bone pain -headache -muscle pain -vomiting This list may not describe all possible side effects. Call your doctor for medical advice about side effects. You may report side effects to FDA at 1-800-FDA-1088. Where should I keep my medicine? Keep out of the reach of children. Store in a refrigerator between   2 and 8 degrees C (36 and 46 degrees F). Keep in carton to protect from light. Throw away this medicine if it is left out of the refrigerator for more than 5 consecutive days. Throw away any unused medicine after the expiration  date. NOTE: This sheet is a summary. It may not cover all possible information. If you have questions about this medicine, talk to your doctor, pharmacist, or health care provider.  2018 Elsevier/Gold Standard (2015-09-19 19:07:04)  

## 2017-03-29 NOTE — Progress Notes (Signed)
Pt instructed to return to clinic tomorrow at 11.00 for another Granix injection. Pt verbalized understanding of instructions

## 2017-03-29 NOTE — Progress Notes (Signed)
She will receive Granix today and tomorrow, hope her counts will recover better by Monday   Truitt Merle MD

## 2017-03-30 ENCOUNTER — Ambulatory Visit (HOSPITAL_BASED_OUTPATIENT_CLINIC_OR_DEPARTMENT_OTHER): Payer: Medicare Other

## 2017-03-30 VITALS — BP 110/76 | HR 107 | Temp 98.2°F | Resp 17

## 2017-03-30 DIAGNOSIS — D701 Agranulocytosis secondary to cancer chemotherapy: Secondary | ICD-10-CM | POA: Diagnosis present

## 2017-03-30 DIAGNOSIS — C20 Malignant neoplasm of rectum: Secondary | ICD-10-CM

## 2017-03-30 MED ORDER — TBO-FILGRASTIM 480 MCG/0.8ML ~~LOC~~ SOSY
480.0000 ug | PREFILLED_SYRINGE | Freq: Once | SUBCUTANEOUS | Status: AC
  Administered 2017-03-30: 480 ug via SUBCUTANEOUS

## 2017-03-30 NOTE — Patient Instructions (Signed)
Tbo-Filgrastim injection What is this medicine? TBO-FILGRASTIM (T B O fil GRA stim) is a granulocyte colony-stimulating factor that stimulates the growth of neutrophils, a type of white blood cell important in the body's fight against infection. It is used to reduce the incidence of fever and infection in patients with certain types of cancer who are receiving chemotherapy that affects the bone marrow. This medicine may be used for other purposes; ask your health care provider or pharmacist if you have questions. COMMON BRAND NAME(S): Granix What should I tell my health care provider before I take this medicine? They need to know if you have any of these conditions: -bone scan or tests planned -kidney disease -sickle cell anemia -an unusual or allergic reaction to tbo-filgrastim, filgrastim, pegfilgrastim, other medicines, foods, dyes, or preservatives -pregnant or trying to get pregnant -breast-feeding How should I use this medicine? This medicine is for injection under the skin. If you get this medicine at home, you will be taught how to prepare and give this medicine. Refer to the Instructions for Use that come with your medication packaging. Use exactly as directed. Take your medicine at regular intervals. Do not take your medicine more often than directed. It is important that you put your used needles and syringes in a special sharps container. Do not put them in a trash can. If you do not have a sharps container, call your pharmacist or healthcare provider to get one. Talk to your pediatrician regarding the use of this medicine in children. Special care may be needed. Overdosage: If you think you have taken too much of this medicine contact a poison control center or emergency room at once. NOTE: This medicine is only for you. Do not share this medicine with others. What if I miss a dose? It is important not to miss your dose. Call your doctor or health care professional if you miss a  dose. What may interact with this medicine? This medicine may interact with the following medications: -medicines that may cause a release of neutrophils, such as lithium This list may not describe all possible interactions. Give your health care provider a list of all the medicines, herbs, non-prescription drugs, or dietary supplements you use. Also tell them if you smoke, drink alcohol, or use illegal drugs. Some items may interact with your medicine. What should I watch for while using this medicine? You may need blood work done while you are taking this medicine. What side effects may I notice from receiving this medicine? Side effects that you should report to your doctor or health care professional as soon as possible: -allergic reactions like skin rash, itching or hives, swelling of the face, lips, or tongue -blood in the urine -dark urine -dizziness -fast heartbeat -feeling faint -shortness of breath or breathing problems -signs and symptoms of infection like fever or chills; cough; or sore throat -signs and symptoms of kidney injury like trouble passing urine or change in the amount of urine -stomach or side pain, or pain at the shoulder -sweating -swelling of the legs, ankles, or abdomen -tiredness Side effects that usually do not require medical attention (report to your doctor or health care professional if they continue or are bothersome): -bone pain -headache -muscle pain -vomiting This list may not describe all possible side effects. Call your doctor for medical advice about side effects. You may report side effects to FDA at 1-800-FDA-1088. Where should I keep my medicine? Keep out of the reach of children. Store in a refrigerator between   2 and 8 degrees C (36 and 46 degrees F). Keep in carton to protect from light. Throw away this medicine if it is left out of the refrigerator for more than 5 consecutive days. Throw away any unused medicine after the expiration  date. NOTE: This sheet is a summary. It may not cover all possible information. If you have questions about this medicine, talk to your doctor, pharmacist, or health care provider.  2018 Elsevier/Gold Standard (2015-09-19 19:07:04)  

## 2017-03-31 ENCOUNTER — Encounter: Payer: Self-pay | Admitting: Hematology

## 2017-04-01 ENCOUNTER — Ambulatory Visit
Admission: RE | Admit: 2017-04-01 | Discharge: 2017-04-01 | Disposition: A | Discharge status: Home / Self Care | Payer: Medicare Other | Source: Ambulatory Visit | Attending: Radiation Oncology | Admitting: Radiation Oncology

## 2017-04-01 ENCOUNTER — Other Ambulatory Visit (HOSPITAL_BASED_OUTPATIENT_CLINIC_OR_DEPARTMENT_OTHER): Payer: Medicare Other

## 2017-04-01 ENCOUNTER — Telehealth: Payer: Self-pay | Admitting: Hematology

## 2017-04-01 ENCOUNTER — Telehealth: Payer: Self-pay | Admitting: *Deleted

## 2017-04-01 ENCOUNTER — Ambulatory Visit (HOSPITAL_BASED_OUTPATIENT_CLINIC_OR_DEPARTMENT_OTHER): Payer: Medicare Other | Admitting: Hematology

## 2017-04-01 VITALS — BP 120/58 | HR 88 | Temp 98.3°F | Resp 20 | Ht 62.0 in | Wt 131.9 lb

## 2017-04-01 DIAGNOSIS — Z853 Personal history of malignant neoplasm of breast: Secondary | ICD-10-CM

## 2017-04-01 DIAGNOSIS — C218 Malignant neoplasm of overlapping sites of rectum, anus and anal canal: Secondary | ICD-10-CM | POA: Diagnosis not present

## 2017-04-01 DIAGNOSIS — D6481 Anemia due to antineoplastic chemotherapy: Secondary | ICD-10-CM | POA: Diagnosis not present

## 2017-04-01 DIAGNOSIS — C211 Malignant neoplasm of anal canal: Secondary | ICD-10-CM | POA: Diagnosis not present

## 2017-04-01 DIAGNOSIS — C20 Malignant neoplasm of rectum: Secondary | ICD-10-CM

## 2017-04-01 DIAGNOSIS — F329 Major depressive disorder, single episode, unspecified: Secondary | ICD-10-CM

## 2017-04-01 DIAGNOSIS — I10 Essential (primary) hypertension: Secondary | ICD-10-CM

## 2017-04-01 DIAGNOSIS — C778 Secondary and unspecified malignant neoplasm of lymph nodes of multiple regions: Secondary | ICD-10-CM

## 2017-04-01 DIAGNOSIS — D701 Agranulocytosis secondary to cancer chemotherapy: Secondary | ICD-10-CM

## 2017-04-01 DIAGNOSIS — Z510 Encounter for antineoplastic radiation therapy: Secondary | ICD-10-CM | POA: Diagnosis not present

## 2017-04-01 DIAGNOSIS — C801 Malignant (primary) neoplasm, unspecified: Secondary | ICD-10-CM

## 2017-04-01 DIAGNOSIS — Z923 Personal history of irradiation: Secondary | ICD-10-CM | POA: Diagnosis not present

## 2017-04-01 LAB — CBC WITH DIFFERENTIAL/PLATELET
BASO%: 0.6 % (ref 0.0–2.0)
BASOS ABS: 0.1 10*3/uL (ref 0.0–0.1)
EOS ABS: 0.3 10*3/uL (ref 0.0–0.5)
EOS%: 3.7 % (ref 0.0–7.0)
HEMATOCRIT: 29.5 % — AB (ref 34.8–46.6)
HEMOGLOBIN: 10.4 g/dL — AB (ref 11.6–15.9)
LYMPH#: 0.6 10*3/uL — AB (ref 0.9–3.3)
LYMPH%: 6.7 % — ABNORMAL LOW (ref 14.0–49.7)
MCH: 32.4 pg (ref 25.1–34.0)
MCHC: 35.3 g/dL (ref 31.5–36.0)
MCV: 91.9 fL (ref 79.5–101.0)
MONO#: 1.2 10*3/uL — ABNORMAL HIGH (ref 0.1–0.9)
MONO%: 14.5 % — ABNORMAL HIGH (ref 0.0–14.0)
NEUT#: 6.1 10*3/uL (ref 1.5–6.5)
NEUT%: 74.5 % (ref 38.4–76.8)
Platelets: 190 10*3/uL (ref 145–400)
RBC: 3.21 10*6/uL — ABNORMAL LOW (ref 3.70–5.45)
RDW: 13.7 % (ref 11.2–14.5)
WBC: 8.2 10*3/uL (ref 3.9–10.3)

## 2017-04-01 LAB — COMPREHENSIVE METABOLIC PANEL
ALBUMIN: 3.7 g/dL (ref 3.5–5.0)
ALK PHOS: 58 U/L (ref 40–150)
ALT: 12 U/L (ref 0–55)
ANION GAP: 6 meq/L (ref 3–11)
AST: 16 U/L (ref 5–34)
BUN: 15.7 mg/dL (ref 7.0–26.0)
CALCIUM: 9.3 mg/dL (ref 8.4–10.4)
CO2: 29 mEq/L (ref 22–29)
Chloride: 104 mEq/L (ref 98–109)
Creatinine: 0.7 mg/dL (ref 0.6–1.1)
EGFR: 81 mL/min/{1.73_m2} — AB (ref >90)
Glucose: 90 mg/dl (ref 70–140)
POTASSIUM: 3.7 meq/L (ref 3.5–5.1)
Sodium: 139 mEq/L (ref 136–145)
Total Bilirubin: 0.29 mg/dL (ref 0.20–1.20)
Total Protein: 6.4 g/dL (ref 6.4–8.3)

## 2017-04-01 NOTE — Telephone Encounter (Signed)
Scheduled lab appt per 8/20 los - patient is aware of appt added before radiation treatment.

## 2017-04-01 NOTE — Telephone Encounter (Signed)
Called patient  To ask that she come in at 1245 for 1pm lab stat, and if results are okay she can have rd tx at 130pm, notified our [place] and spoke with [facility] therapist on tomo and they are aware of  switching her time 10:44 AM

## 2017-04-02 ENCOUNTER — Ambulatory Visit
Admission: RE | Admit: 2017-04-02 | Discharge: 2017-04-02 | Disposition: A | Discharge status: Home / Self Care | Payer: Medicare Other | Source: Ambulatory Visit | Attending: Radiation Oncology | Admitting: Radiation Oncology

## 2017-04-02 DIAGNOSIS — Z510 Encounter for antineoplastic radiation therapy: Secondary | ICD-10-CM | POA: Diagnosis not present

## 2017-04-02 DIAGNOSIS — F329 Major depressive disorder, single episode, unspecified: Secondary | ICD-10-CM | POA: Diagnosis not present

## 2017-04-02 DIAGNOSIS — I10 Essential (primary) hypertension: Secondary | ICD-10-CM | POA: Diagnosis not present

## 2017-04-02 DIAGNOSIS — Z923 Personal history of irradiation: Secondary | ICD-10-CM | POA: Diagnosis not present

## 2017-04-02 DIAGNOSIS — Z853 Personal history of malignant neoplasm of breast: Secondary | ICD-10-CM | POA: Diagnosis not present

## 2017-04-02 DIAGNOSIS — C218 Malignant neoplasm of overlapping sites of rectum, anus and anal canal: Secondary | ICD-10-CM | POA: Diagnosis not present

## 2017-04-02 DIAGNOSIS — C211 Malignant neoplasm of anal canal: Secondary | ICD-10-CM | POA: Diagnosis not present

## 2017-04-03 ENCOUNTER — Ambulatory Visit: Payer: Medicare Other | Admitting: Family Medicine

## 2017-04-03 ENCOUNTER — Ambulatory Visit
Admission: RE | Admit: 2017-04-03 | Discharge: 2017-04-03 | Disposition: A | Discharge status: Home / Self Care | Payer: Medicare Other | Source: Ambulatory Visit | Attending: Radiation Oncology | Admitting: Radiation Oncology

## 2017-04-03 DIAGNOSIS — C218 Malignant neoplasm of overlapping sites of rectum, anus and anal canal: Secondary | ICD-10-CM | POA: Diagnosis not present

## 2017-04-03 DIAGNOSIS — Z923 Personal history of irradiation: Secondary | ICD-10-CM | POA: Diagnosis not present

## 2017-04-03 DIAGNOSIS — C211 Malignant neoplasm of anal canal: Secondary | ICD-10-CM | POA: Diagnosis not present

## 2017-04-03 DIAGNOSIS — F329 Major depressive disorder, single episode, unspecified: Secondary | ICD-10-CM | POA: Diagnosis not present

## 2017-04-03 DIAGNOSIS — I10 Essential (primary) hypertension: Secondary | ICD-10-CM | POA: Diagnosis not present

## 2017-04-03 DIAGNOSIS — Z510 Encounter for antineoplastic radiation therapy: Secondary | ICD-10-CM | POA: Diagnosis not present

## 2017-04-03 DIAGNOSIS — Z853 Personal history of malignant neoplasm of breast: Secondary | ICD-10-CM | POA: Diagnosis not present

## 2017-04-04 ENCOUNTER — Other Ambulatory Visit (HOSPITAL_BASED_OUTPATIENT_CLINIC_OR_DEPARTMENT_OTHER): Payer: Medicare Other

## 2017-04-04 ENCOUNTER — Ambulatory Visit
Admission: RE | Admit: 2017-04-04 | Discharge: 2017-04-04 | Disposition: A | Discharge status: Home / Self Care | Payer: Medicare Other | Source: Ambulatory Visit | Attending: Radiation Oncology | Admitting: Radiation Oncology

## 2017-04-04 DIAGNOSIS — C211 Malignant neoplasm of anal canal: Secondary | ICD-10-CM | POA: Diagnosis not present

## 2017-04-04 DIAGNOSIS — Z510 Encounter for antineoplastic radiation therapy: Secondary | ICD-10-CM | POA: Diagnosis not present

## 2017-04-04 DIAGNOSIS — F329 Major depressive disorder, single episode, unspecified: Secondary | ICD-10-CM | POA: Diagnosis not present

## 2017-04-04 DIAGNOSIS — C20 Malignant neoplasm of rectum: Secondary | ICD-10-CM | POA: Diagnosis present

## 2017-04-04 DIAGNOSIS — I10 Essential (primary) hypertension: Secondary | ICD-10-CM | POA: Diagnosis not present

## 2017-04-04 DIAGNOSIS — Z923 Personal history of irradiation: Secondary | ICD-10-CM | POA: Diagnosis not present

## 2017-04-04 DIAGNOSIS — C778 Secondary and unspecified malignant neoplasm of lymph nodes of multiple regions: Secondary | ICD-10-CM | POA: Diagnosis not present

## 2017-04-04 DIAGNOSIS — C218 Malignant neoplasm of overlapping sites of rectum, anus and anal canal: Secondary | ICD-10-CM | POA: Diagnosis not present

## 2017-04-04 DIAGNOSIS — Z853 Personal history of malignant neoplasm of breast: Secondary | ICD-10-CM | POA: Diagnosis not present

## 2017-04-04 LAB — CBC WITH DIFFERENTIAL/PLATELET
BASO%: 2 % (ref 0.0–2.0)
Basophils Absolute: 0.1 10*3/uL (ref 0.0–0.1)
EOS ABS: 0.6 10*3/uL — AB (ref 0.0–0.5)
EOS%: 23.3 % — AB (ref 0.0–7.0)
HEMATOCRIT: 29.6 % — AB (ref 34.8–46.6)
HEMOGLOBIN: 10.3 g/dL — AB (ref 11.6–15.9)
LYMPH#: 0.6 10*3/uL — AB (ref 0.9–3.3)
LYMPH%: 24.9 % (ref 14.0–49.7)
MCH: 32.1 pg (ref 25.1–34.0)
MCHC: 34.8 g/dL (ref 31.5–36.0)
MCV: 92.2 fL (ref 79.5–101.0)
MONO#: 0.3 10*3/uL (ref 0.1–0.9)
MONO%: 11.5 % (ref 0.0–14.0)
NEUT%: 38.3 % — ABNORMAL LOW (ref 38.4–76.8)
NEUTROS ABS: 1 10*3/uL — AB (ref 1.5–6.5)
Platelets: 218 10*3/uL (ref 145–400)
RBC: 3.21 10*6/uL — ABNORMAL LOW (ref 3.70–5.45)
RDW: 13.8 % (ref 11.2–14.5)
WBC: 2.5 10*3/uL — AB (ref 3.9–10.3)

## 2017-04-05 ENCOUNTER — Ambulatory Visit
Admission: RE | Admit: 2017-04-05 | Discharge: 2017-04-05 | Disposition: A | Discharge status: Home / Self Care | Payer: Medicare Other | Source: Ambulatory Visit | Attending: Radiation Oncology | Admitting: Radiation Oncology

## 2017-04-05 DIAGNOSIS — F329 Major depressive disorder, single episode, unspecified: Secondary | ICD-10-CM | POA: Diagnosis not present

## 2017-04-05 DIAGNOSIS — Z510 Encounter for antineoplastic radiation therapy: Secondary | ICD-10-CM | POA: Diagnosis not present

## 2017-04-05 DIAGNOSIS — Z853 Personal history of malignant neoplasm of breast: Secondary | ICD-10-CM | POA: Diagnosis not present

## 2017-04-05 DIAGNOSIS — C218 Malignant neoplasm of overlapping sites of rectum, anus and anal canal: Secondary | ICD-10-CM | POA: Diagnosis not present

## 2017-04-05 DIAGNOSIS — C211 Malignant neoplasm of anal canal: Secondary | ICD-10-CM | POA: Diagnosis not present

## 2017-04-05 DIAGNOSIS — Z923 Personal history of irradiation: Secondary | ICD-10-CM | POA: Diagnosis not present

## 2017-04-05 DIAGNOSIS — I10 Essential (primary) hypertension: Secondary | ICD-10-CM | POA: Diagnosis not present

## 2017-04-06 ENCOUNTER — Encounter: Payer: Self-pay | Admitting: Hematology

## 2017-04-08 ENCOUNTER — Ambulatory Visit (HOSPITAL_BASED_OUTPATIENT_CLINIC_OR_DEPARTMENT_OTHER): Payer: Medicare Other

## 2017-04-08 ENCOUNTER — Ambulatory Visit
Admission: RE | Admit: 2017-04-08 | Discharge: 2017-04-08 | Disposition: A | Discharge status: Home / Self Care | Payer: Medicare Other | Source: Ambulatory Visit | Attending: Radiation Oncology | Admitting: Radiation Oncology

## 2017-04-08 ENCOUNTER — Ambulatory Visit (HOSPITAL_COMMUNITY): Payer: Medicare Other

## 2017-04-08 ENCOUNTER — Other Ambulatory Visit: Payer: Medicare Other

## 2017-04-08 ENCOUNTER — Telehealth: Payer: Self-pay | Admitting: Hematology

## 2017-04-08 ENCOUNTER — Ambulatory Visit: Payer: Medicare Other

## 2017-04-08 ENCOUNTER — Ambulatory Visit (HOSPITAL_BASED_OUTPATIENT_CLINIC_OR_DEPARTMENT_OTHER): Payer: Medicare Other | Admitting: Nurse Practitioner

## 2017-04-08 VITALS — BP 119/62 | HR 95 | Temp 98.1°F | Resp 18 | Wt 134.3 lb

## 2017-04-08 DIAGNOSIS — D701 Agranulocytosis secondary to cancer chemotherapy: Secondary | ICD-10-CM | POA: Diagnosis not present

## 2017-04-08 DIAGNOSIS — Z853 Personal history of malignant neoplasm of breast: Secondary | ICD-10-CM

## 2017-04-08 DIAGNOSIS — F329 Major depressive disorder, single episode, unspecified: Secondary | ICD-10-CM | POA: Diagnosis not present

## 2017-04-08 DIAGNOSIS — C20 Malignant neoplasm of rectum: Secondary | ICD-10-CM

## 2017-04-08 DIAGNOSIS — C211 Malignant neoplasm of anal canal: Secondary | ICD-10-CM | POA: Diagnosis not present

## 2017-04-08 DIAGNOSIS — I10 Essential (primary) hypertension: Secondary | ICD-10-CM | POA: Diagnosis not present

## 2017-04-08 DIAGNOSIS — C218 Malignant neoplasm of overlapping sites of rectum, anus and anal canal: Secondary | ICD-10-CM | POA: Diagnosis not present

## 2017-04-08 DIAGNOSIS — C778 Secondary and unspecified malignant neoplasm of lymph nodes of multiple regions: Secondary | ICD-10-CM

## 2017-04-08 DIAGNOSIS — Z923 Personal history of irradiation: Secondary | ICD-10-CM | POA: Diagnosis not present

## 2017-04-08 DIAGNOSIS — Z510 Encounter for antineoplastic radiation therapy: Secondary | ICD-10-CM | POA: Diagnosis not present

## 2017-04-08 LAB — COMPREHENSIVE METABOLIC PANEL
ALT: 9 U/L (ref 0–55)
ANION GAP: 8 meq/L (ref 3–11)
AST: 13 U/L (ref 5–34)
Albumin: 3.5 g/dL (ref 3.5–5.0)
Alkaline Phosphatase: 43 U/L (ref 40–150)
BUN: 21.6 mg/dL (ref 7.0–26.0)
CHLORIDE: 106 meq/L (ref 98–109)
CO2: 26 meq/L (ref 22–29)
Calcium: 9.4 mg/dL (ref 8.4–10.4)
Creatinine: 0.7 mg/dL (ref 0.6–1.1)
EGFR: 86 mL/min/{1.73_m2} — ABNORMAL LOW (ref >90)
Glucose: 91 mg/dl (ref 70–140)
Potassium: 4.2 mEq/L (ref 3.5–5.1)
Sodium: 139 mEq/L (ref 136–145)
Total Bilirubin: 0.26 mg/dL (ref 0.20–1.20)
Total Protein: 6.4 g/dL (ref 6.4–8.3)

## 2017-04-08 LAB — CBC WITH DIFFERENTIAL/PLATELET
BASO%: 0.8 % (ref 0.0–2.0)
Basophils Absolute: 0 10*3/uL (ref 0.0–0.1)
EOS ABS: 0.8 10*3/uL — AB (ref 0.0–0.5)
EOS%: 30.2 % — ABNORMAL HIGH (ref 0.0–7.0)
HCT: 28.6 % — ABNORMAL LOW (ref 34.8–46.6)
HGB: 9.9 g/dL — ABNORMAL LOW (ref 11.6–15.9)
LYMPH%: 10.9 % — AB (ref 14.0–49.7)
MCH: 32.1 pg (ref 25.1–34.0)
MCHC: 34.6 g/dL (ref 31.5–36.0)
MCV: 92.9 fL (ref 79.5–101.0)
MONO#: 0.4 10*3/uL (ref 0.1–0.9)
MONO%: 16.5 % — ABNORMAL HIGH (ref 0.0–14.0)
NEUT#: 1 10*3/uL — ABNORMAL LOW (ref 1.5–6.5)
NEUT%: 41.6 % (ref 38.4–76.8)
PLATELETS: 260 10*3/uL (ref 145–400)
RBC: 3.08 10*6/uL — AB (ref 3.70–5.45)
RDW: 14.3 % (ref 11.2–14.5)
WBC: 2.5 10*3/uL — ABNORMAL LOW (ref 3.9–10.3)
lymph#: 0.3 10*3/uL — ABNORMAL LOW (ref 0.9–3.3)

## 2017-04-08 NOTE — Telephone Encounter (Signed)
Gave patient AVS and calendar for upcoming August and September appointments.

## 2017-04-08 NOTE — Progress Notes (Signed)
[facility] OFFICE PROGRESS NOTE   Diagnosis:  Anal cancer SUMMARY OF ONCOLOGIC HISTORY:       Squamous cell carcinoma of rectum ([place])   02/11/2017 Procedure    Colonoscopy 02/11/17 IMPRESSION - One 12 to 15 mm polyp in the rectum (vs proximal anus). Biopsied. Firm, - just inside anal verge or on it? - One 2 mm polyp in the descending colon, removed with a cold biopsy forceps. Resected and retrieved. - The examination was otherwise normal on direct and retroflexion views.       02/11/2017 Pathology Results    Diagnosis 02/11/17 1. Colon, biopsy, descending, polyp - TUBULAR ADENOMA (ONE FRAGMENT). - NO HIGH GRADE DYSPLASIA OR MALIGNANCY. 2. Colon, polyp(s), rectum, polyp - biopsy only - SQUAMOUS CELL CARCINOMA, BASALOID-TYPE. - SEE MICROSCOPIC DESCRIPTION. Microscopic Comment 2. There are multiple fragments of rectal mucosa which are involved by poorly differentiated squamous cell carcinoma with basaloid features.      02/14/2017 Initial Diagnosis    Squamous cell carcinoma of rectum ([place])     02/20/2017 Imaging    CT CAP W Contrast 02/20/17 IMPRESSION: 1. Abnormal right pelvic sidewall lymph node measuring 1.9 x 1.6 cm. Imaging features highly suspicious for metastatic disease. PET-CT may prove helpful to further evaluate. Borderline lymph node identified at the right iliac bifurcation and an upper normal size lymph node is identified along the left external iliac artery chain. 2. Asymmetric wall thickening distal rectum, presumably the location of primary neoplasm. 3. Asymmetric airspace opacity in the left apex has a wedge shaped configuration on coronal imaging. This may be an area of scarring or potentially infectious/inflammatory etiology. Attention on follow-up recommended. 4. Tiny right perifissural nodule in the right middle lobe, likely subpleural lymph node but attention on follow-up recommended. 5. Multiple low-density well-defined  liver lesions, most suggestive of cysts. 6.  Aortic Atherosclerois (ICD10-170.0)      02/25/2017 PET scan    PET 02/25/17 IMPRESSION: 1. Focal accentuated activity in the anus as detailed above, with bilateral external iliac hypermetabolic adenopathy indicating metastatic disease. No hypermetabolic lesion of the liver or other findings of metastatic disease. 2. The tiny nodule adjacent to the minor fissure does not demonstrate hypermetabolic activity but is below sensitive PET-CT size thresholds. This may warrant surveillance. 3. Other imaging findings of potential clinical significance: Aortic Atherosclerosis (ICD10-I70.0). Coronary atherosclerosis. Asymmetric right degenerative glenohumeral arthropathy. Small calcified thyroid nodules are present but not hypermetabolic.      03/11/2017 -  Radiation Therapy    Concurrent Chemo Radiation with Dr. Lisbeth Renshaw      03/11/2017 -  Chemotherapy    Concurrent Chemo radiation with with mitomycin C and 5-fluorouracil.        CURRENT THERAPY:  Concurrent chemotherapoy and Radiation with mitomycin C on Day 1 and 29 and 5-fluorouracil on day 1-5, 29-33  INTERVAL HISTORY:   Ms. Wolfer returns as scheduled.She reports a recent sore throat. She thinks this is related to "postnasal drip". No fever. She reports a good appetite. No diarrhea. No rectal pain.  Objective:  Vital signs in last 24 hours:  Blood pressure 119/62, pulse 95, temperature 98.1 F (36.7 C), temperature source Oral, resp. rate 18, weight 134 lb 4.8 oz (60.9 kg), SpO2 100 %.    HEENT: Mild white coating over tongue. No buccal thrush. Small mildly erythematous area right posterior buccal region. Resp: Lungs clear bilaterally. Cardio: Regular rate and rhythm. GI: Abdomen soft and nontender. No hepatomegaly. Vascular: No leg edema. Calves soft  and nontender. Neuro: Alert and oriented.  Skin: Palms without erythema.    Lab Results:  Lab Results    Component Value Date   WBC 2.5 (L) 04/08/2017   HGB 9.9 (L) 04/08/2017   HCT 28.6 (L) 04/08/2017   MCV 92.9 04/08/2017   PLT 260 04/08/2017   NEUTROABS 1.0 (L) 04/08/2017    Imaging:  No results found.  Medications: I have reviewed the patient's current medications.  Assessment/Plan: 1. Squamous cell carcinoma of the rectum versus proximal anus.  Colonoscopy 02/11/2017 showed a 12-15 mm polyp in the rectum, semi-sessile. A 2 mm polyp was found in the descending colon. Pathology on the rectal polyp showed squamous cell carcinoma, basaloid type. The descending colon polyp pathology showed tubular adenoma.   CT scans of the chest/abdomen/pelvis 02/19/2017 showed an abnormal right pelvic sidewall lymph node measuring 1.9 x 1.6 cm; borderline lymph node identified at the right iliac bifurcation and an upper normal size lymph node identified along the left external iliac artery chain. There was asymmetric wall thickening at the distal rectum. There was an area of asymmetric airspace opacity in the left lung apex; a tiny right perifissuralnodule in the right middle lobe,likely subpleural lymph node. Multiple low-density well-defined liver lesions were noted felt to likely be cysts.  PET scan scheduled 02/25/2017  Initiation of radiation 03/11/2017; cycle 1 mitomycin-C/5-FU 03/11/2017 2. History of left breast cancer 2001 diagnosed and treated in [place] status post mastectomy with patient report of 2 of 17 positive lymph nodes. She completed chemotherapy and radiation. 3. Hypertension currently on Norvasc and Hyzaar. 4. Depression currently on Zoloft. 5. Hypercholesterolemia on Lipitor. 6. Intermittent chest discomfort status post nuclear stress test 08/27/2016-normal study. 7. Personal and family history of breast cancer. Mother diagnosed at age 44. Declines referral to genetics counselor. 8. Mucositis secondary to chemotherapy. 9. Neutropenia related to chemotherapy and  radiation therapy.   Disposition: Ms. Lamison appears stable. She continues radiation. We reviewed today's labs. She has persistent stable neutropenia. We are holding today's treatment and rescheduling for one week. Ms. Salameh and her daughter understand the rationale for this. She will return for a follow-up CBC on 04/11/2017. She is scheduled to have a PICC line placed 04/16/2017. We will have her come to the office for labs prior to the PICC line placement to ensure the neutrophil count is adequate for treatment. She will be seen in a follow-up visit that day as well.  Neutropenic precautions were again reviewed at today's visit. She understands to contact the office with fever, chills, other signs of infection.  Patient seen with Dr. Burr Medico.    Ned Card ANP/GNP-BC   04/08/2017  1:35 PM

## 2017-04-09 ENCOUNTER — Ambulatory Visit
Admission: RE | Admit: 2017-04-09 | Discharge: 2017-04-09 | Disposition: A | Discharge status: Home / Self Care | Payer: Medicare Other | Source: Ambulatory Visit | Attending: Radiation Oncology | Admitting: Radiation Oncology

## 2017-04-09 DIAGNOSIS — Z853 Personal history of malignant neoplasm of breast: Secondary | ICD-10-CM | POA: Diagnosis not present

## 2017-04-09 DIAGNOSIS — C211 Malignant neoplasm of anal canal: Secondary | ICD-10-CM | POA: Diagnosis not present

## 2017-04-09 DIAGNOSIS — F329 Major depressive disorder, single episode, unspecified: Secondary | ICD-10-CM | POA: Diagnosis not present

## 2017-04-09 DIAGNOSIS — Z923 Personal history of irradiation: Secondary | ICD-10-CM | POA: Diagnosis not present

## 2017-04-09 DIAGNOSIS — Z510 Encounter for antineoplastic radiation therapy: Secondary | ICD-10-CM | POA: Diagnosis not present

## 2017-04-09 DIAGNOSIS — C218 Malignant neoplasm of overlapping sites of rectum, anus and anal canal: Secondary | ICD-10-CM | POA: Diagnosis not present

## 2017-04-09 DIAGNOSIS — I10 Essential (primary) hypertension: Secondary | ICD-10-CM | POA: Diagnosis not present

## 2017-04-10 ENCOUNTER — Ambulatory Visit
Admission: RE | Admit: 2017-04-10 | Discharge: 2017-04-10 | Disposition: A | Discharge status: Home / Self Care | Payer: Medicare Other | Source: Ambulatory Visit | Attending: Radiation Oncology | Admitting: Radiation Oncology

## 2017-04-10 ENCOUNTER — Telehealth: Payer: Self-pay | Admitting: *Deleted

## 2017-04-10 DIAGNOSIS — Z510 Encounter for antineoplastic radiation therapy: Secondary | ICD-10-CM | POA: Diagnosis not present

## 2017-04-10 DIAGNOSIS — F329 Major depressive disorder, single episode, unspecified: Secondary | ICD-10-CM | POA: Diagnosis not present

## 2017-04-10 DIAGNOSIS — C211 Malignant neoplasm of anal canal: Secondary | ICD-10-CM | POA: Diagnosis not present

## 2017-04-10 DIAGNOSIS — Z923 Personal history of irradiation: Secondary | ICD-10-CM | POA: Diagnosis not present

## 2017-04-10 DIAGNOSIS — Z853 Personal history of malignant neoplasm of breast: Secondary | ICD-10-CM | POA: Diagnosis not present

## 2017-04-10 DIAGNOSIS — I10 Essential (primary) hypertension: Secondary | ICD-10-CM | POA: Diagnosis not present

## 2017-04-10 DIAGNOSIS — C218 Malignant neoplasm of overlapping sites of rectum, anus and anal canal: Secondary | ICD-10-CM | POA: Diagnosis not present

## 2017-04-10 NOTE — Telephone Encounter (Signed)
I called her daughter back, she has spoken to rad/onc nurse Thayer Headings who suggested Benadryl gel to her scalp. Patient also complains of difficulty sleeping due to the itchiness. I recommend her to take Benadryl 25-50 mg at night, for itchiness and sleep. She agrees. She knows to call us if her symptom persists or gets worse, I may consider low-dose steroids.  Truitt Merle MD

## 2017-04-10 NOTE — Telephone Encounter (Signed)
"  Denise Wolfe calling for my Mom who's having a weird side effect.  Her scalp won't stop itching.  Radiating the opposite end.  Itching violently throughout the day to her entire scalp but mostly the back and top/crown.  No dandruff, peeling skin, bumps or rash.  Scalp is red from her scratching.  Didn't report it but started itching when chemotherapy and radiation started on 03-11-2017.  Has gotten worse, not slept in two nights.  Tried Scalpicin, massaging scalp with witch hazel and different shampoos.  On medication for seasonal allergies to ragweed but no benadryl used.  We're here now for radiation.  Since we're here , call my cell number [phone].  [name] told me to call the symptom line because she's in clinic today."  Routing call information to collaborative nurse and provider for review.  Further patient communication through collaborative nurse.

## 2017-04-11 ENCOUNTER — Other Ambulatory Visit (HOSPITAL_BASED_OUTPATIENT_CLINIC_OR_DEPARTMENT_OTHER): Payer: Medicare Other

## 2017-04-11 ENCOUNTER — Encounter: Payer: Self-pay | Admitting: Radiation Oncology

## 2017-04-11 ENCOUNTER — Ambulatory Visit
Admission: RE | Admit: 2017-04-11 | Discharge: 2017-04-11 | Disposition: A | Discharge status: Home / Self Care | Payer: Medicare Other | Source: Ambulatory Visit | Attending: Radiation Oncology | Admitting: Radiation Oncology

## 2017-04-11 DIAGNOSIS — Z510 Encounter for antineoplastic radiation therapy: Secondary | ICD-10-CM | POA: Diagnosis not present

## 2017-04-11 DIAGNOSIS — C20 Malignant neoplasm of rectum: Secondary | ICD-10-CM | POA: Diagnosis present

## 2017-04-11 DIAGNOSIS — I10 Essential (primary) hypertension: Secondary | ICD-10-CM | POA: Diagnosis not present

## 2017-04-11 DIAGNOSIS — Z923 Personal history of irradiation: Secondary | ICD-10-CM | POA: Diagnosis not present

## 2017-04-11 DIAGNOSIS — C211 Malignant neoplasm of anal canal: Secondary | ICD-10-CM | POA: Diagnosis not present

## 2017-04-11 DIAGNOSIS — C218 Malignant neoplasm of overlapping sites of rectum, anus and anal canal: Secondary | ICD-10-CM | POA: Diagnosis not present

## 2017-04-11 DIAGNOSIS — F329 Major depressive disorder, single episode, unspecified: Secondary | ICD-10-CM | POA: Diagnosis not present

## 2017-04-11 DIAGNOSIS — Z853 Personal history of malignant neoplasm of breast: Secondary | ICD-10-CM | POA: Diagnosis not present

## 2017-04-11 DIAGNOSIS — C778 Secondary and unspecified malignant neoplasm of lymph nodes of multiple regions: Secondary | ICD-10-CM | POA: Diagnosis not present

## 2017-04-11 LAB — CBC WITH DIFFERENTIAL/PLATELET
BASO%: 1.5 % (ref 0.0–2.0)
BASOS ABS: 0 10*3/uL (ref 0.0–0.1)
EOS%: 38 % — ABNORMAL HIGH (ref 0.0–7.0)
Eosinophils Absolute: 0.7 10*3/uL — ABNORMAL HIGH (ref 0.0–0.5)
HCT: 27.7 % — ABNORMAL LOW (ref 34.8–46.6)
HGB: 9.8 g/dL — ABNORMAL LOW (ref 11.6–15.9)
LYMPH%: 14.9 % (ref 14.0–49.7)
MCH: 33.6 pg (ref 25.1–34.0)
MCHC: 35.5 g/dL (ref 31.5–36.0)
MCV: 94.5 fL (ref 79.5–101.0)
MONO#: 0.4 10*3/uL (ref 0.1–0.9)
MONO%: 19.7 % — ABNORMAL HIGH (ref 0.0–14.0)
NEUT#: 0.5 10*3/uL — CL (ref 1.5–6.5)
NEUT%: 25.9 % — AB (ref 38.4–76.8)
PLATELETS: 339 10*3/uL (ref 145–400)
RBC: 2.93 10*6/uL — AB (ref 3.70–5.45)
RDW: 14.4 % (ref 11.2–14.5)
WBC: 1.8 10*3/uL — ABNORMAL LOW (ref 3.9–10.3)
lymph#: 0.3 10*3/uL — ABNORMAL LOW (ref 0.9–3.3)

## 2017-04-12 ENCOUNTER — Ambulatory Visit
Admission: RE | Admit: 2017-04-12 | Discharge: 2017-04-12 | Disposition: A | Discharge status: Home / Self Care | Payer: Medicare Other | Source: Ambulatory Visit | Attending: Radiation Oncology | Admitting: Radiation Oncology

## 2017-04-12 ENCOUNTER — Ambulatory Visit: Payer: Medicare Other

## 2017-04-12 ENCOUNTER — Ambulatory Visit (HOSPITAL_BASED_OUTPATIENT_CLINIC_OR_DEPARTMENT_OTHER): Payer: Medicare Other | Admitting: Hematology

## 2017-04-12 ENCOUNTER — Other Ambulatory Visit: Payer: Self-pay | Admitting: *Deleted

## 2017-04-12 ENCOUNTER — Other Ambulatory Visit (HOSPITAL_BASED_OUTPATIENT_CLINIC_OR_DEPARTMENT_OTHER): Payer: Medicare Other

## 2017-04-12 ENCOUNTER — Other Ambulatory Visit: Payer: Self-pay | Admitting: General Surgery

## 2017-04-12 DIAGNOSIS — C20 Malignant neoplasm of rectum: Secondary | ICD-10-CM

## 2017-04-12 DIAGNOSIS — I10 Essential (primary) hypertension: Secondary | ICD-10-CM | POA: Diagnosis not present

## 2017-04-12 DIAGNOSIS — D6481 Anemia due to antineoplastic chemotherapy: Secondary | ICD-10-CM | POA: Diagnosis not present

## 2017-04-12 DIAGNOSIS — Z853 Personal history of malignant neoplasm of breast: Secondary | ICD-10-CM

## 2017-04-12 DIAGNOSIS — F329 Major depressive disorder, single episode, unspecified: Secondary | ICD-10-CM | POA: Diagnosis not present

## 2017-04-12 DIAGNOSIS — D701 Agranulocytosis secondary to cancer chemotherapy: Secondary | ICD-10-CM

## 2017-04-12 DIAGNOSIS — C218 Malignant neoplasm of overlapping sites of rectum, anus and anal canal: Secondary | ICD-10-CM | POA: Diagnosis not present

## 2017-04-12 DIAGNOSIS — C211 Malignant neoplasm of anal canal: Secondary | ICD-10-CM | POA: Diagnosis not present

## 2017-04-12 DIAGNOSIS — Z510 Encounter for antineoplastic radiation therapy: Secondary | ICD-10-CM | POA: Diagnosis not present

## 2017-04-12 DIAGNOSIS — C801 Malignant (primary) neoplasm, unspecified: Secondary | ICD-10-CM

## 2017-04-12 DIAGNOSIS — T451X5A Adverse effect of antineoplastic and immunosuppressive drugs, initial encounter: Secondary | ICD-10-CM

## 2017-04-12 DIAGNOSIS — Z923 Personal history of irradiation: Secondary | ICD-10-CM | POA: Diagnosis not present

## 2017-04-12 LAB — COMPREHENSIVE METABOLIC PANEL
ALBUMIN: 3.5 g/dL (ref 3.5–5.0)
ALT: 8 U/L (ref 0–55)
AST: 12 U/L (ref 5–34)
Alkaline Phosphatase: 41 U/L (ref 40–150)
Anion Gap: 7 mEq/L (ref 3–11)
BILIRUBIN TOTAL: 0.27 mg/dL (ref 0.20–1.20)
BUN: 20.4 mg/dL (ref 7.0–26.0)
CALCIUM: 9.6 mg/dL (ref 8.4–10.4)
CHLORIDE: 105 meq/L (ref 98–109)
CO2: 26 mEq/L (ref 22–29)
CREATININE: 0.7 mg/dL (ref 0.6–1.1)
EGFR: 84 mL/min/{1.73_m2} — ABNORMAL LOW (ref >90)
Glucose: 81 mg/dl (ref 70–140)
Potassium: 4.1 mEq/L (ref 3.5–5.1)
Sodium: 139 mEq/L (ref 136–145)
TOTAL PROTEIN: 6.4 g/dL (ref 6.4–8.3)

## 2017-04-12 LAB — CBC WITH DIFFERENTIAL/PLATELET
BASO%: 1.7 % (ref 0.0–2.0)
Basophils Absolute: 0 10*3/uL (ref 0.0–0.1)
EOS%: 38.4 % — AB (ref 0.0–7.0)
Eosinophils Absolute: 0.7 10*3/uL — ABNORMAL HIGH (ref 0.0–0.5)
HEMATOCRIT: 28.8 % — AB (ref 34.8–46.6)
HEMOGLOBIN: 10 g/dL — AB (ref 11.6–15.9)
LYMPH#: 0.2 10*3/uL — AB (ref 0.9–3.3)
LYMPH%: 11.4 % — ABNORMAL LOW (ref 14.0–49.7)
MCH: 33.1 pg (ref 25.1–34.0)
MCHC: 34.9 g/dL (ref 31.5–36.0)
MCV: 95.1 fL (ref 79.5–101.0)
MONO#: 0.3 10*3/uL (ref 0.1–0.9)
MONO%: 18.1 % — ABNORMAL HIGH (ref 0.0–14.0)
NEUT%: 30.4 % — ABNORMAL LOW (ref 38.4–76.8)
NEUTROS ABS: 0.5 10*3/uL — AB (ref 1.5–6.5)
Platelets: 344 10*3/uL (ref 145–400)
RBC: 3.03 10*6/uL — ABNORMAL LOW (ref 3.70–5.45)
RDW: 14.8 % — AB (ref 11.2–14.5)
WBC: 1.7 10*3/uL — ABNORMAL LOW (ref 3.9–10.3)

## 2017-04-13 ENCOUNTER — Other Ambulatory Visit: Payer: Self-pay

## 2017-04-13 ENCOUNTER — Ambulatory Visit (HOSPITAL_BASED_OUTPATIENT_CLINIC_OR_DEPARTMENT_OTHER): Payer: Medicare Other

## 2017-04-13 VITALS — BP 132/60 | HR 104 | Temp 98.3°F | Resp 18

## 2017-04-13 DIAGNOSIS — C20 Malignant neoplasm of rectum: Secondary | ICD-10-CM

## 2017-04-13 DIAGNOSIS — D701 Agranulocytosis secondary to cancer chemotherapy: Secondary | ICD-10-CM

## 2017-04-13 MED ORDER — TBO-FILGRASTIM 480 MCG/0.8ML ~~LOC~~ SOSY
480.0000 ug | PREFILLED_SYRINGE | Freq: Once | SUBCUTANEOUS | Status: AC
  Administered 2017-04-13: 480 ug via SUBCUTANEOUS

## 2017-04-13 NOTE — Patient Instructions (Signed)
Tbo-Filgrastim injection What is this medicine? TBO-FILGRASTIM (T B O fil GRA stim) is a granulocyte colony-stimulating factor that stimulates the growth of neutrophils, a type of white blood cell important in the body's fight against infection. It is used to reduce the incidence of fever and infection in patients with certain types of cancer who are receiving chemotherapy that affects the bone marrow. This medicine may be used for other purposes; ask your health care provider or pharmacist if you have questions. COMMON BRAND NAME(S): Granix What should I tell my health care provider before I take this medicine? They need to know if you have any of these conditions: -bone scan or tests planned -kidney disease -sickle cell anemia -an unusual or allergic reaction to tbo-filgrastim, filgrastim, pegfilgrastim, other medicines, foods, dyes, or preservatives -pregnant or trying to get pregnant -breast-feeding How should I use this medicine? This medicine is for injection under the skin. If you get this medicine at home, you will be taught how to prepare and give this medicine. Refer to the Instructions for Use that come with your medication packaging. Use exactly as directed. Take your medicine at regular intervals. Do not take your medicine more often than directed. It is important that you put your used needles and syringes in a special sharps container. Do not put them in a trash can. If you do not have a sharps container, call your pharmacist or healthcare provider to get one. Talk to your pediatrician regarding the use of this medicine in children. Special care may be needed. Overdosage: If you think you have taken too much of this medicine contact a poison control center or emergency room at once. NOTE: This medicine is only for you. Do not share this medicine with others. What if I miss a dose? It is important not to miss your dose. Call your doctor or health care professional if you miss a  dose. What may interact with this medicine? This medicine may interact with the following medications: -medicines that may cause a release of neutrophils, such as lithium This list may not describe all possible interactions. Give your health care provider a list of all the medicines, herbs, non-prescription drugs, or dietary supplements you use. Also tell them if you smoke, drink alcohol, or use illegal drugs. Some items may interact with your medicine. What should I watch for while using this medicine? You may need blood work done while you are taking this medicine. What side effects may I notice from receiving this medicine? Side effects that you should report to your doctor or health care professional as soon as possible: -allergic reactions like skin rash, itching or hives, swelling of the face, lips, or tongue -blood in the urine -dark urine -dizziness -fast heartbeat -feeling faint -shortness of breath or breathing problems -signs and symptoms of infection like fever or chills; cough; or sore throat -signs and symptoms of kidney injury like trouble passing urine or change in the amount of urine -stomach or side pain, or pain at the shoulder -sweating -swelling of the legs, ankles, or abdomen -tiredness Side effects that usually do not require medical attention (report to your doctor or health care professional if they continue or are bothersome): -bone pain -headache -muscle pain -vomiting This list may not describe all possible side effects. Call your doctor for medical advice about side effects. You may report side effects to FDA at 1-800-FDA-1088. Where should I keep my medicine? Keep out of the reach of children. Store in a refrigerator between   2 and 8 degrees C (36 and 46 degrees F). Keep in carton to protect from light. Throw away this medicine if it is left out of the refrigerator for more than 5 consecutive days. Throw away any unused medicine after the expiration  date. NOTE: This sheet is a summary. It may not cover all possible information. If you have questions about this medicine, talk to your doctor, pharmacist, or health care provider.  2018 Elsevier/Gold Standard (2015-09-19 19:07:04)  

## 2017-04-14 ENCOUNTER — Encounter: Payer: Self-pay | Admitting: Hematology

## 2017-04-14 DIAGNOSIS — D701 Agranulocytosis secondary to cancer chemotherapy: Secondary | ICD-10-CM | POA: Insufficient documentation

## 2017-04-14 DIAGNOSIS — T451X5A Adverse effect of antineoplastic and immunosuppressive drugs, initial encounter: Secondary | ICD-10-CM

## 2017-04-14 NOTE — Progress Notes (Signed)
[facility]  Telephone:([phone]) [phone] [phone]  Clinic Follow up Note   Patient Care Team: [name], DO as PCP - General (Family Medicine) Silverio Decamp, MD as Consulting Physician (Family Medicine) 04/12/2017  SUMMARY OF ONCOLOGIC HISTORY:   Squamous cell carcinoma of rectum ([place])   02/11/2017 Procedure    Colonoscopy 02/11/17 IMPRESSION - One 12 to 15 mm polyp in the rectum (vs proximal anus). Biopsied. Firm, - just inside anal verge or on it? - One 2 mm polyp in the descending colon, removed with a cold biopsy forceps. Resected and retrieved. - The examination was otherwise normal on direct and retroflexion views.       02/11/2017 Pathology Results    Diagnosis 02/11/17 1. Colon, biopsy, descending, polyp - TUBULAR ADENOMA (ONE FRAGMENT). - NO HIGH GRADE DYSPLASIA OR MALIGNANCY. 2. Colon, polyp(s), rectum, polyp - biopsy only - SQUAMOUS CELL CARCINOMA, BASALOID-TYPE. - SEE MICROSCOPIC DESCRIPTION. Microscopic Comment 2. There are multiple fragments of rectal mucosa which are involved by poorly differentiated squamous cell carcinoma with basaloid features.      02/14/2017 Initial Diagnosis    Squamous cell carcinoma of rectum ([place])     02/20/2017 Imaging    CT CAP W Contrast 02/20/17 IMPRESSION: 1. Abnormal right pelvic sidewall lymph node measuring 1.9 x 1.6 cm. Imaging features highly suspicious for metastatic disease. PET-CT may prove helpful to further evaluate. Borderline lymph node identified at the right iliac bifurcation and an upper normal size lymph node is identified along the left external iliac artery chain. 2. Asymmetric wall thickening distal rectum, presumably the location of primary neoplasm. 3. Asymmetric airspace opacity in the left apex has a wedge shaped configuration on coronal imaging. This may be an area of scarring or potentially infectious/inflammatory etiology. Attention on follow-up recommended. 4. Tiny  right perifissural nodule in the right middle lobe, likely subpleural lymph node but attention on follow-up recommended. 5. Multiple low-density well-defined liver lesions, most suggestive of cysts. 6.  Aortic Atherosclerois (ICD10-170.0)      02/25/2017 PET scan    PET 02/25/17 IMPRESSION: 1. Focal accentuated activity in the anus as detailed above, with bilateral external iliac hypermetabolic adenopathy indicating metastatic disease. No hypermetabolic lesion of the liver or other findings of metastatic disease. 2. The tiny nodule adjacent to the minor fissure does not demonstrate hypermetabolic activity but is below sensitive PET-CT size thresholds. This may warrant surveillance. 3. Other imaging findings of potential clinical significance: Aortic Atherosclerosis (ICD10-I70.0). Coronary atherosclerosis. Asymmetric right degenerative glenohumeral arthropathy. Small calcified thyroid nodules are present but not hypermetabolic.      03/11/2017 -  Radiation Therapy    Concurrent Chemo Radiation with Dr. Lisbeth Renshaw      03/11/2017 -  Chemotherapy    Concurrent Chemo radiation with with mitomycin C and 5-fluorouracil.         CURRENT THERAPY:  Concurrent chemotherapoy and Radiation with mitomycin C on Day 1 and 29 and 5-fluorouracil on day 1-5, 29-33   INTERVAL HISTORY:   Denise Wolfe is here for a follow up to discussed her neutropenia. She is accompanied by her daughter. Her repeated use CBC this morning showed ANC 0.5K. she is very concerned. she denies any fever or chills. She is tolerating radiation well, rectal pain is tolerable. No other new complaints.  REVIEW OF SYSTEMS:   Constitutional: Denies fevers, chills or abnormal weight loss (+) fatigue Eyes: Denies blurriness of vision Ears, nose, mouth, throat, and face: Denies mucositis or sore throat Respiratory: Denies  cough, dyspnea or wheezes Cardiovascular: Denies palpitation, chest discomfort or lower extremity  swelling Gastrointestinal:  Denies nausea, heartburn or change in bowel habits Skin: Denies abnormal skin rashes Lymphatics: Denies new lymphadenopathy or easy bruising Neurological:Denies numbness, tingling or new weaknesses Behavioral/Psych: Mood is stable, no new changes  All other systems were reviewed with the patient and are negative.   MEDICAL HISTORY:  Past Medical History:  Diagnosis Date  . Anemia    during childhood  . Arthritis    hands, shoulder  . Breast cancer ([place]) 03/2000   left side modified mastecomy  . Cancer [facility])    breast, age 62; left side  . Colon cancer ([place]) 02/11/2017   rectum-squamous cell carcinoma  . Depression   . Hypertension   . Squamous cell carcinoma of rectum ([place]) 02/14/2017    SURGICAL HISTORY: Past Surgical History:  Procedure Laterality Date  . ABDOMINAL HYSTERECTOMY     With bilateral oophorectomy. 2002.  Marland Kitchen BREAST BIOPSY Right 2004   benign  . BREAST SURGERY Left 2001   biopsy  . CATARACT EXTRACTION Bilateral 2017  . IR FLUORO GUIDE CV LINE RIGHT  03/11/2017  . IR US GUIDE VASC ACCESS RIGHT  03/11/2017  . MASTECTOMY MODIFIED RADICAL Left 2001  . MENISCUS DEBRIDEMENT Right 2003  . TUMOR EXCISION Right    thigh area    I have reviewed the social history and family history with the patient and they are unchanged from previous note.  ALLERGIES:  is allergic to ibuprofen.  MEDICATIONS:  Current Outpatient Prescriptions  Medication Sig Dispense Refill  . amLODipine (NORVASC) 5 MG tablet Take 1 tablet (5 mg total) by mouth daily. 90 tablet 3  . amphetamine-dextroamphetamine (ADDERALL) 10 MG tablet Take 1 tablet (10 mg total) by mouth 2 (two) times daily. 60 tablet 0  . atorvastatin (LIPITOR) 80 MG tablet Take 0.5 tablets (40 mg total) by mouth daily at 6 PM. 45 tablet 3  . cholecalciferol (VITAMIN D) 400 units TABS tablet Take 400 Units by mouth daily.    Marland Kitchen losartan-hydrochlorothiazide (HYZAAR) 50-12.5 MG tablet Start with one  half tablet daily for 1 week, then 1 full tablet daily (Patient taking differently: Take 1 tablet by mouth daily. Start with one half tablet daily for 1 week, then 1 full tablet daily) 90 tablet 3  . magic mouthwash SOLN Swish and spit 5-10 mls by mouth four times daily as needed. (Patient not taking: Reported on 03/25/2017) 240 mL 1  . ondansetron (ZOFRAN) 8 MG tablet Take 1 tablet (8 mg total) by mouth every 8 (eight) hours as needed for nausea or vomiting. (Patient not taking: Reported on 03/25/2017) 20 tablet 0  . prochlorperazine (COMPAZINE) 10 MG tablet Take 1 tablet (10 mg total) by mouth every 6 (six) hours as needed for nausea or vomiting. (Patient not taking: Reported on 03/25/2017) 30 tablet 0  . sertraline (ZOLOFT) 50 MG tablet Take 1 tablet (50 mg total) by mouth daily. 90 tablet 3   Current Facility-Administered Medications  Medication Dose Route Frequency Provider Last Rate Last Dose  . 0.9 %  sodium chloride infusion  500 mL Intravenous Continuous [name], MD        PHYSICAL EXAMINATION: ECOG PERFORMANCE STATUS: 1 GENERAL:alert, no distress and comfortable SKIN: skin color, texture, turgor are normal, no rashes or significant lesions EYES: normal, Conjunctiva are pink and non-injected, sclera clear OROPHARYNX:no exudate, no erythema and lips, buccal mucosa, and tongue normal  NECK: supple, thyroid normal size,  non-tender, without nodularity LYMPH:  no palpable lymphadenopathy in the cervical, axillary or inguinal LUNGS: clear to auscultation and percussion with normal breathing effort HEART: regular rate & rhythm and no murmurs and no lower extremity edema ABDOMEN:abdomen soft, non-tender and normal bowel sounds, No local megaly. Digital exam deferred today, scan showed a 1.5 cm anterior low rectum mass.  Musculoskeletal:no cyanosis of digits and no clubbing  NEURO: alert & oriented x 3 with fluent speech, no focal motor/sensory deficits   LABORATORY DATA:  I have  reviewed the data as listed CBC Latest Ref Rng & Units 04/12/2017 04/11/2017 04/08/2017  WBC 3.9 - 10.3 10e3/uL 1.7(L) 1.8(L) 2.5(L)  Hemoglobin 11.6 - 15.9 g/dL 10.0(L) 9.8(L) 9.9(L)  Hematocrit 34.8 - 46.6 % 28.8(L) 27.7(L) 28.6(L)  Platelets 145 - 400 10e3/uL 344 339 260     CMP Latest Ref Rng & Units 04/12/2017 04/08/2017 04/01/2017  Glucose 70 - 140 mg/dl 81 91 90  BUN 7.0 - 26.0 mg/dL 20.4 21.6 15.7  Creatinine 0.6 - 1.1 mg/dL 0.7 0.7 0.7  Sodium 136 - 145 mEq/L 139 139 139  Potassium 3.5 - 5.1 mEq/L 4.1 4.2 3.7  Chloride 96 - 112 mEq/L - - -  CO2 22 - 29 mEq/L 26 26 29   Calcium 8.4 - 10.4 mg/dL 9.6 9.4 9.3  Total Protein 6.4 - 8.3 g/dL 6.4 6.4 6.4  Total Bilirubin 0.20 - 1.20 mg/dL 0.27 0.26 0.29  Alkaline Phos 40 - 150 U/L 41 43 58  AST 5 - 34 U/L 12 13 16   ALT 0 - 55 U/L 8 9 12    ANC 0.5k Today   PATHOLOGY Diagnosis 02/11/17 1. Colon, biopsy, descending, polyp - TUBULAR ADENOMA (ONE FRAGMENT). - NO HIGH GRADE DYSPLASIA OR MALIGNANCY. 2. Colon, polyp(s), rectum, polyp - biopsy only - SQUAMOUS CELL CARCINOMA, BASALOID-TYPE. - SEE MICROSCOPIC DESCRIPTION. Microscopic Comment 2. There are multiple fragments of rectal mucosa which are involved by poorly differentiated squamous cell carcinoma with basaloid features.   PROCEDURES Colonoscopy 02/11/17 IMPRESSION - One 12 to 15 mm polyp in the rectum (vs proximal anus). Biopsied. Firm, - just inside anal verge or on it? - One 2 mm polyp in the descending colon, removed with a cold biopsy forceps. Resected and retrieved. - The examination was otherwise normal on direct and retroflexion views.   RADIOGRAPHIC STUDIES: I have personally reviewed the radiological images as listed and agreed with the findings in the report. No results found.   PET 02/25/17 IMPRESSION: 1. Focal accentuated activity in the anus as detailed above, with bilateral external iliac hypermetabolic adenopathy indicating metastatic disease. No  hypermetabolic lesion of the liver or other findings of metastatic disease. 2. The tiny nodule adjacent to the minor fissure does not demonstrate hypermetabolic activity but is below sensitive PET-CT size thresholds. This may warrant surveillance. 3. Other imaging findings of potential clinical significance: Aortic Atherosclerosis (ICD10-I70.0). Coronary atherosclerosis. Asymmetric right degenerative glenohumeral arthropathy. Small calcified thyroid nodules are present but not hypermetabolic.  ASSESSMENT & PLAN:  Denise Wolfe is here for a follow up. She has a past medical history of breast cancer in 2001, Anemia, Arthritis and HTN.   1. Prolonged neutropenia  -She has developed prolonged neutropenia after her first cycle chemotherapy, improved after clinics on weekend, her [place] dropped to 0.5K again this week. She has not had any infections so far. -Her neutropenia is secondary to her chemotherapy, and previous chemotherapy for breast cancer. She did have slow recovery of neutropenia after her chemotherapy for breast  cancer. -I discussed with her radiation oncologist Dr. Lisbeth Renshaw today, he will change her planned radiation twice today to once, due to her neutropenia, she received this morning. -I recommend her to get 1 dose of Granix tomorrow, to support her continue radiation, and hopefully recovers well to do chemotherapy next week. -She will went to [facility] oncologist Dr. Altamease Oiler who recommend her to continue chemo and radiation despite her neutropenia. I spoke with her today.  Patient is concerned about her treatment (radiation and or chemo) to be postponed which may compromise her cancer outcome  -We discussed the risk of neutropenia fever and infection after chemotherapy. After lengthy discussion, patient agreed to check her blood counts again net week on 9/4, if her [place] is or grater 1.0K, we will proceed with chemo next week with 5-fu, otherwise I'll postpone her chemotherapy to the week of  September 10. My concern is her neutropenia will get worse after chemotherapy the week of 9/4 and her last week radiation needs to be postponed again. She voiced good understanding and agrees with the plan. -I spoke with radiation oncologist Dr. Lisbeth Renshaw today, we'll try to keep her on radiation schedule as long as her neutropenia is not severe. I would consider prophylactic antibiotics if her [place] dropped to below 0.5K.    1. Squamous Cell Carcinoma of Rectum, [id number] -She underwent a screening colonoscopy 02/11/2017. Findings included a 12-15 mm polyp in the rectum, semi-sessile. A 2 mm polyp was found in the descending colon. Pathology on the rectal polyp showed squamous cell carcinoma, basaloid type. The descending colon polyp showed tubular adenoma -CT scans of the chest/abdomen/pelvis 02/19/2017 showed an abnormal right pelvic sidewall lymph node measuring 1.9 x 1.6 cm -We discussed her PET from 02/25/17. The PET shows enlarged hypermetabolic bilateral external iliac lymph nodes (2), no distant metastasis.  -Patient had a question if her pelvic and inguinal metastasis from her previous breast cancer. Were discussed this is unlikely, given the distribution of the lymph nodes which is more consistent with rectal cancer metastasis. Her previous breast cancer was diagnosed 17 years ago, hormonal sensitive, and she received a standard treatment. No evidence of recurrence since then. -Although she has locally advanced disease, this is still considered curable with concurrent chemotherapy and irradiation. -I recommend a standard chemotherapy with 5-FU and mitomycin regimen, with concurrent radiation  -she tolerated the first week chemo well, he did develop severe and persistent neutropenia, and radiation was held for 3 days on week 3 -Due to her persistent neutropenia, week 4 chemotherapy has been postponed --if her blood counts are adequate, will plan to start second cycle chemo next week 9/4    2. Hx of  left Breast Cancer, stage 3 -Diagnosed in 2001 -Had mastectomy and ALND, adjuvant chemo therapy and radiation, took tamoxifen and aromasin -Mother diagnosed at age 45. Declines referral to genetics counselor. -We discussed that it is not likely that breast cancer has occurred in her rectum. -I recommend genetic testing due to her family history and personal history of breast cancer. She will think about it. -She is overdue for her mammogram, I will order an screening mammogram after her current treatment. Breast exam was normal on 03/25/2017  3. Hypertension - currently on Norvasc and Hyzaar.  4. Depression - currently on Zoloft.  5. Hypercholesterolemia -on Lipitor.  6. Anemia and neutropenia -Second to chemotherapy, neutropenia precautions reviewed with patient again. -Continue lab weekly -We'll decide if chemotherapy dose reduction on August 27 based on her nadir  blood counts   7. Mucositis, secondary to chemotherapy -Resolved.  PLAN:  -she is scheduled for Granix injection tomorrow noon -lab, f/u on 9/4, if ANC>=1.0 will proceed with chemo    No orders of the defined types were placed in this encounter.  All questions were answered. The patient knows to call the clinic with any problems, questions or concerns. No barriers to learning was detected.  I spent 15 minutes counseling the patient face to face. The total time spent in the appointment was 25 minutes and more than 50% was on counseling and review of test results  This document serves as a record of services personally performed by Truitt Merle, MD. It was created on her behalf by Arlyce Harman, a trained medical scribe. The creation of this record is based on the scribe's personal observations and the provider's statements to them. This document has been checked and approved by the attending provider.    Truitt Merle, MD 04/12/2017

## 2017-04-16 ENCOUNTER — Ambulatory Visit
Admission: RE | Admit: 2017-04-16 | Discharge: 2017-04-16 | Disposition: A | Discharge status: Home / Self Care | Payer: Medicare Other | Source: Ambulatory Visit | Attending: Radiation Oncology | Admitting: Radiation Oncology

## 2017-04-16 ENCOUNTER — Ambulatory Visit (HOSPITAL_BASED_OUTPATIENT_CLINIC_OR_DEPARTMENT_OTHER): Payer: Medicare Other

## 2017-04-16 ENCOUNTER — Other Ambulatory Visit (HOSPITAL_BASED_OUTPATIENT_CLINIC_OR_DEPARTMENT_OTHER): Payer: Medicare Other

## 2017-04-16 ENCOUNTER — Encounter (HOSPITAL_COMMUNITY): Payer: Self-pay | Admitting: Interventional Radiology

## 2017-04-16 ENCOUNTER — Ambulatory Visit (HOSPITAL_COMMUNITY)
Admission: RE | Admit: 2017-04-16 | Discharge: 2017-04-16 | Disposition: A | Discharge status: Home / Self Care | Payer: Medicare Other | Source: Ambulatory Visit | Attending: Nurse Practitioner | Admitting: Nurse Practitioner

## 2017-04-16 ENCOUNTER — Telehealth: Payer: Self-pay | Admitting: Hematology

## 2017-04-16 ENCOUNTER — Ambulatory Visit (HOSPITAL_BASED_OUTPATIENT_CLINIC_OR_DEPARTMENT_OTHER): Payer: Medicare Other | Admitting: Hematology

## 2017-04-16 ENCOUNTER — Other Ambulatory Visit: Payer: Self-pay | Admitting: Nurse Practitioner

## 2017-04-16 VITALS — BP 164/59 | HR 95 | Temp 97.9°F | Resp 18 | Ht 62.0 in | Wt 134.2 lb

## 2017-04-16 DIAGNOSIS — Z923 Personal history of irradiation: Secondary | ICD-10-CM | POA: Diagnosis not present

## 2017-04-16 DIAGNOSIS — C20 Malignant neoplasm of rectum: Secondary | ICD-10-CM | POA: Insufficient documentation

## 2017-04-16 DIAGNOSIS — D6481 Anemia due to antineoplastic chemotherapy: Secondary | ICD-10-CM

## 2017-04-16 DIAGNOSIS — Z5111 Encounter for antineoplastic chemotherapy: Secondary | ICD-10-CM

## 2017-04-16 DIAGNOSIS — Z853 Personal history of malignant neoplasm of breast: Secondary | ICD-10-CM | POA: Diagnosis not present

## 2017-04-16 DIAGNOSIS — F329 Major depressive disorder, single episode, unspecified: Secondary | ICD-10-CM

## 2017-04-16 DIAGNOSIS — I10 Essential (primary) hypertension: Secondary | ICD-10-CM | POA: Diagnosis not present

## 2017-04-16 DIAGNOSIS — C218 Malignant neoplasm of overlapping sites of rectum, anus and anal canal: Secondary | ICD-10-CM | POA: Diagnosis not present

## 2017-04-16 DIAGNOSIS — D701 Agranulocytosis secondary to cancer chemotherapy: Secondary | ICD-10-CM

## 2017-04-16 DIAGNOSIS — T451X5A Adverse effect of antineoplastic and immunosuppressive drugs, initial encounter: Secondary | ICD-10-CM

## 2017-04-16 DIAGNOSIS — C211 Malignant neoplasm of anal canal: Secondary | ICD-10-CM | POA: Diagnosis not present

## 2017-04-16 DIAGNOSIS — Z85048 Personal history of other malignant neoplasm of rectum, rectosigmoid junction, and anus: Secondary | ICD-10-CM | POA: Diagnosis not present

## 2017-04-16 DIAGNOSIS — Z510 Encounter for antineoplastic radiation therapy: Secondary | ICD-10-CM | POA: Diagnosis not present

## 2017-04-16 DIAGNOSIS — C801 Malignant (primary) neoplasm, unspecified: Secondary | ICD-10-CM

## 2017-04-16 HISTORY — PX: IR US GUIDE VASC ACCESS RIGHT: IMG2390

## 2017-04-16 HISTORY — PX: IR FLUORO GUIDE CV LINE RIGHT: IMG2283

## 2017-04-16 LAB — COMPREHENSIVE METABOLIC PANEL
ALT: 9 U/L (ref 0–55)
AST: 14 U/L (ref 5–34)
Albumin: 3.4 g/dL — ABNORMAL LOW (ref 3.5–5.0)
Alkaline Phosphatase: 46 U/L (ref 40–150)
Anion Gap: 8 mEq/L (ref 3–11)
BUN: 15.1 mg/dL (ref 7.0–26.0)
CO2: 25 meq/L (ref 22–29)
Calcium: 9.3 mg/dL (ref 8.4–10.4)
Chloride: 107 mEq/L (ref 98–109)
Creatinine: 0.7 mg/dL (ref 0.6–1.1)
EGFR: 81 mL/min/{1.73_m2} — AB (ref >90)
GLUCOSE: 99 mg/dL (ref 70–140)
POTASSIUM: 3.8 meq/L (ref 3.5–5.1)
SODIUM: 140 meq/L (ref 136–145)
TOTAL PROTEIN: 6.1 g/dL — AB (ref 6.4–8.3)
Total Bilirubin: 0.24 mg/dL (ref 0.20–1.20)

## 2017-04-16 LAB — CBC WITH DIFFERENTIAL/PLATELET
BASO%: 0.6 % (ref 0.0–2.0)
Basophils Absolute: 0 10*3/uL (ref 0.0–0.1)
EOS ABS: 0.8 10*3/uL — AB (ref 0.0–0.5)
EOS%: 15.6 % — AB (ref 0.0–7.0)
HCT: 27.3 % — ABNORMAL LOW (ref 34.8–46.6)
HGB: 9.3 g/dL — ABNORMAL LOW (ref 11.6–15.9)
LYMPH%: 6.4 % — AB (ref 14.0–49.7)
MCH: 32.5 pg (ref 25.1–34.0)
MCHC: 34.1 g/dL (ref 31.5–36.0)
MCV: 95.5 fL (ref 79.5–101.0)
MONO#: 0.7 10*3/uL (ref 0.1–0.9)
MONO%: 14.8 % — AB (ref 0.0–14.0)
NEUT%: 62.6 % (ref 38.4–76.8)
NEUTROS ABS: 3.1 10*3/uL (ref 1.5–6.5)
Platelets: 279 10*3/uL (ref 145–400)
RBC: 2.86 10*6/uL — AB (ref 3.70–5.45)
RDW: 15.6 % — ABNORMAL HIGH (ref 11.2–14.5)
WBC: 5 10*3/uL (ref 3.9–10.3)
lymph#: 0.3 10*3/uL — ABNORMAL LOW (ref 0.9–3.3)

## 2017-04-16 MED ORDER — SODIUM CHLORIDE 0.9 % IV SOLN
Freq: Once | INTRAVENOUS | Status: AC
  Administered 2017-04-16: 11:00:00 via INTRAVENOUS

## 2017-04-16 MED ORDER — SODIUM CHLORIDE 0.9 % IV SOLN
1000.0000 mg/m2/d | INTRAVENOUS | Status: DC
  Administered 2017-04-16: 6500 mg via INTRAVENOUS
  Filled 2017-04-16: qty 130

## 2017-04-16 MED ORDER — LIDOCAINE HCL (PF) 1 % IJ SOLN
INTRAMUSCULAR | Status: AC
  Filled 2017-04-16: qty 30

## 2017-04-16 MED ORDER — LIDOCAINE HCL (PF) 1 % IJ SOLN
INTRAMUSCULAR | Status: AC | PRN
  Administered 2017-04-16: 10 mL

## 2017-04-16 MED ORDER — PROCHLORPERAZINE MALEATE 10 MG PO TABS
10.0000 mg | ORAL_TABLET | Freq: Once | ORAL | Status: AC
  Administered 2017-04-16: 10 mg via ORAL

## 2017-04-16 MED ORDER — PROCHLORPERAZINE MALEATE 10 MG PO TABS
ORAL_TABLET | ORAL | Status: AC
  Filled 2017-04-16: qty 1

## 2017-04-16 NOTE — Progress Notes (Signed)
[facility]  [phone] [phone]  Clinic Follow up Note   Patient Care Team: [name], DO as PCP - General (Family Medicine) Silverio Decamp, MD as Consulting Physician (Family Medicine) 04/16/2017  SUMMARY OF ONCOLOGIC HISTORY:   Squamous cell carcinoma of rectum ([place])   02/11/2017 Procedure    Colonoscopy 02/11/17 IMPRESSION - One 12 to 15 mm polyp in the rectum (vs proximal anus). Biopsied. Firm, - just inside anal verge or on it? - One 2 mm polyp in the descending colon, removed with a cold biopsy forceps. Resected and retrieved. - The examination was otherwise normal on direct and retroflexion views.       02/11/2017 Pathology Results    Diagnosis 02/11/17 1. Colon, biopsy, descending, polyp - TUBULAR ADENOMA (ONE FRAGMENT). - NO HIGH GRADE DYSPLASIA OR MALIGNANCY. 2. Colon, polyp(s), rectum, polyp - biopsy only - SQUAMOUS CELL CARCINOMA, BASALOID-TYPE. - SEE MICROSCOPIC DESCRIPTION. Microscopic Comment 2. There are multiple fragments of rectal mucosa which are involved by poorly differentiated squamous cell carcinoma with basaloid features.      02/14/2017 Initial Diagnosis    Squamous cell carcinoma of rectum ([place])     02/20/2017 Imaging    CT CAP W Contrast 02/20/17 IMPRESSION: 1. Abnormal right pelvic sidewall lymph node measuring 1.9 x 1.6 cm. Imaging features highly suspicious for metastatic disease. PET-CT may prove helpful to further evaluate. Borderline lymph node identified at the right iliac bifurcation and an upper normal size lymph node is identified along the left external iliac artery chain. 2. Asymmetric wall thickening distal rectum, presumably the location of primary neoplasm. 3. Asymmetric airspace opacity in the left apex has a wedge shaped configuration on coronal imaging. This may be an area of scarring or potentially infectious/inflammatory etiology. Attention on follow-up recommended. 4. Tiny  right perifissural nodule in the right middle lobe, likely subpleural lymph node but attention on follow-up recommended. 5. Multiple low-density well-defined liver lesions, most suggestive of cysts. 6.  Aortic Atherosclerois (ICD10-170.0)      02/25/2017 PET scan    PET 02/25/17 IMPRESSION: 1. Focal accentuated activity in the anus as detailed above, with bilateral external iliac hypermetabolic adenopathy indicating metastatic disease. No hypermetabolic lesion of the liver or other findings of metastatic disease. 2. The tiny nodule adjacent to the minor fissure does not demonstrate hypermetabolic activity but is below sensitive PET-CT size thresholds. This may warrant surveillance. 3. Other imaging findings of potential clinical significance: Aortic Atherosclerosis (ICD10-I70.0). Coronary atherosclerosis. Asymmetric right degenerative glenohumeral arthropathy. Small calcified thyroid nodules are present but not hypermetabolic.      03/11/2017 -  Radiation Therapy    Concurrent Chemo Radiation with Dr. Lisbeth Renshaw      03/11/2017 -  Chemotherapy    Concurrent Chemo radiation with with mitomycin C and 5-fluorouracil.         CURRENT THERAPY:  Concurrent chemotherapoy and Radiation with mitomycin C on Day 1 and 29 and 5-fluorouracil on day 1-5, 29-33, will no longer include mitomycin starting with cycle 2.    INTERVAL HISTORY:   Denise Wolfe is here for a follow up. She presents to the clinic today accompanied by her daughter. She reports Friday evening she had cramps and heavy sweating and she experienced diarrhea that went on for 2-3 hours before it improved. She again had this episode Saturday night as well. She did not have a fever.  She is eating and drinking fine but not sleeping. Her BM was fine yesterday. The pain in  her rectal region is increasing. She is getting soreness in her grown area but is tolerable. She denies skin breakdown from radiation.    REVIEW OF SYSTEMS:     Constitutional: Denies fevers, chills or abnormal weight loss (+) fatigue Eyes: Denies blurriness of vision Ears, nose, mouth, throat, and face: Denies mucositis or sore throat Respiratory: Denies cough, dyspnea or wheezes Cardiovascular: Denies palpitation, chest discomfort or lower extremity swelling Gastrointestinal:  Denies nausea, heartburn or change in bowel habits (+) hemorrhoid pain Skin: Denies abnormal skin rashes Lymphatics: Denies new lymphadenopathy or easy bruising Neurological:Denies numbness, tingling or new weaknesses Behavioral/Psych: Mood is stable, no new changes  All other systems were reviewed with the patient and are negative.   MEDICAL HISTORY:  Past Medical History:  Diagnosis Date  . Anemia    during childhood  . Arthritis    hands, shoulder  . Breast cancer ([place]) 03/2000   left side modified mastecomy  . Cancer [facility])    breast, age 55; left side  . Colon cancer ([place]) 02/11/2017   rectum-squamous cell carcinoma  . Depression   . Hypertension   . Squamous cell carcinoma of rectum ([place]) 02/14/2017    SURGICAL HISTORY: Past Surgical History:  Procedure Laterality Date  . ABDOMINAL HYSTERECTOMY     With bilateral oophorectomy. 2002.  Marland Kitchen BREAST BIOPSY Right 2004   benign  . BREAST SURGERY Left 2001   biopsy  . CATARACT EXTRACTION Bilateral 2017  . IR FLUORO GUIDE CV LINE RIGHT  03/11/2017  . IR FLUORO GUIDE CV LINE RIGHT  04/16/2017  . IR US GUIDE VASC ACCESS RIGHT  03/11/2017  . IR US GUIDE VASC ACCESS RIGHT  04/16/2017  . MASTECTOMY MODIFIED RADICAL Left 2001  . MENISCUS DEBRIDEMENT Right 2003  . TUMOR EXCISION Right    thigh area    I have reviewed the social history and family history with the patient and they are unchanged from previous note.  ALLERGIES:  is allergic to ibuprofen.  MEDICATIONS:  Current Outpatient Prescriptions  Medication Sig Dispense Refill  . amphetamine-dextroamphetamine (ADDERALL) 10 MG tablet Take 1 tablet (10 mg  total) by mouth 2 (two) times daily. 60 tablet 0  . atorvastatin (LIPITOR) 80 MG tablet Take 0.5 tablets (40 mg total) by mouth daily at 6 PM. 45 tablet 3  . cholecalciferol (VITAMIN D) 400 units TABS tablet Take 400 Units by mouth daily.    Marland Kitchen losartan-hydrochlorothiazide (HYZAAR) 50-12.5 MG tablet Start with one half tablet daily for 1 week, then 1 full tablet daily (Patient taking differently: Take 1 tablet by mouth daily. Start with one half tablet daily for 1 week, then 1 full tablet daily) 90 tablet 3  . magic mouthwash SOLN Swish and spit 5-10 mls by mouth four times daily as needed. 240 mL 1  . sertraline (ZOLOFT) 50 MG tablet Take 1 tablet (50 mg total) by mouth daily. 90 tablet 3  . amLODipine (NORVASC) 5 MG tablet Take 1 tablet (5 mg total) by mouth daily. (Patient not taking: Reported on 04/16/2017) 90 tablet 3  . ondansetron (ZOFRAN) 8 MG tablet Take 1 tablet (8 mg total) by mouth every 8 (eight) hours as needed for nausea or vomiting. (Patient not taking: Reported on 03/25/2017) 20 tablet 0  . prochlorperazine (COMPAZINE) 10 MG tablet Take 1 tablet (10 mg total) by mouth every 6 (six) hours as needed for nausea or vomiting. (Patient not taking: Reported on 03/25/2017) 30 tablet 0   Current Facility-Administered Medications  Medication Dose Route Frequency Provider Last Rate Last Dose  . 0.9 %  sodium chloride infusion  500 mL Intravenous Continuous [name], MD        PHYSICAL EXAMINATION: ECOG PERFORMANCE STATUS: 1 Vitals:   04/16/17 0956  BP: (!) 164/59  Pulse: 95  Resp: 18  Temp: 97.9 F (36.6 C)  TempSrc: Oral  SpO2: 99%  Weight: 134 lb 3.2 oz (60.9 kg)  Height: '5\' 2"'$  (1.575 m)   GENERAL:alert, no distress and comfortable SKIN: skin color, texture, turgor are normal, no rashes or significant lesions EYES: normal, Conjunctiva are pink and non-injected, sclera clear OROPHARYNX:no exudate, no erythema and lips, buccal mucosa, and tongue normal  NECK: supple, thyroid  normal size, non-tender, without nodularity LYMPH:  no palpable lymphadenopathy in the cervical, axillary or inguinal LUNGS: clear to auscultation and percussion with normal breathing effort HEART: regular rate & rhythm and no murmurs and no lower extremity edema ABDOMEN:abdomen soft, non-tender and normal bowel sounds, No local megaly. Digital exam deferred today, scan showed a 1.5 cm anterior low rectum mass.  Musculoskeletal:no cyanosis of digits and no clubbing  NEURO: alert & oriented x 3 with fluent speech, no focal motor/sensory deficits   LABORATORY DATA:  I have reviewed the data as listed CBC Latest Ref Rng & Units 04/16/2017 04/12/2017 04/11/2017  WBC 3.9 - 10.3 10e3/uL 5.0 1.7(L) 1.8(L)  Hemoglobin 11.6 - 15.9 g/dL 9.3(L) 10.0(L) 9.8(L)  Hematocrit 34.8 - 46.6 % 27.3(L) 28.8(L) 27.7(L)  Platelets 145 - 400 10e3/uL 279 344 339     CMP Latest Ref Rng & Units 04/16/2017 04/12/2017 04/08/2017  Glucose 70 - 140 mg/dl 99 81 91  BUN 7.0 - 26.0 mg/dL 15.1 20.4 21.6  Creatinine 0.6 - 1.1 mg/dL 0.7 0.7 0.7  Sodium 136 - 145 mEq/L 140 139 139  Potassium 3.5 - 5.1 mEq/L 3.8 4.1 4.2  Chloride 96 - 112 mEq/L - - -  CO2 22 - 29 mEq/L '25 26 26  '$ Calcium 8.4 - 10.4 mg/dL 9.3 9.6 9.4  Total Protein 6.4 - 8.3 g/dL 6.1(L) 6.4 6.4  Total Bilirubin 0.20 - 1.20 mg/dL 0.24 0.27 0.26  Alkaline Phos 40 - 150 U/L 46 41 43  AST 5 - 34 U/L '14 12 13  '$ ALT 0 - 55 U/L '9 8 9   '$ ANC 3.1K today   PATHOLOGY Diagnosis 02/11/17 1. Colon, biopsy, descending, polyp - TUBULAR ADENOMA (ONE FRAGMENT). - NO HIGH GRADE DYSPLASIA OR MALIGNANCY. 2. Colon, polyp(s), rectum, polyp - biopsy only - SQUAMOUS CELL CARCINOMA, BASALOID-TYPE. - SEE MICROSCOPIC DESCRIPTION. Microscopic Comment 2. There are multiple fragments of rectal mucosa which are involved by poorly differentiated squamous cell carcinoma with basaloid features.   PROCEDURES Colonoscopy 02/11/17 IMPRESSION - One 12 to 15 mm polyp in the rectum (vs  proximal anus). Biopsied. Firm, - just inside anal verge or on it? - One 2 mm polyp in the descending colon, removed with a cold biopsy forceps. Resected and retrieved. - The examination was otherwise normal on direct and retroflexion views.   RADIOGRAPHIC STUDIES: I have personally reviewed the radiological images as listed and agreed with the findings in the report. Ir Fluoro Guide Cv Line Right  Result Date: 04/16/2017 INDICATION: History of rectal cancer. In need of intravenous access for chemotherapy administration. EXAM: ULTRASOUND AND FLUOROSCOPIC GUIDED PICC LINE INSERTION MEDICATIONS: None. CONTRAST:  None FLUOROSCOPY TIME:  6 seconds (1 mGy) COMPLICATIONS: None immediate. TECHNIQUE: The procedure, risks, benefits, and alternatives were explained to  the patient and informed written consent was obtained. A timeout was performed prior to the initiation of the procedure. The right upper extremity was prepped with chlorhexidine in a sterile fashion, and a sterile drape was applied covering the operative field. Maximum barrier sterile technique with sterile gowns and gloves were used for the procedure. A timeout was performed prior to the initiation of the procedure. Local anesthesia was provided with 1% lidocaine. Under direct ultrasound guidance, the basilic vein was accessed with a micropuncture kit after the overlying soft tissues were anesthetized with 1% lidocaine. After the overlying soft tissues were anesthetized, a small venotomy incision was created and a micropuncture kit was utilized to access the right basilic vein. Real-time ultrasound guidance was utilized for vascular access including the acquisition of a permanent ultrasound image documenting patency of the accessed vessel. A guidewire was advanced to the level of the superior caval-atrial junction for measurement purposes and the PICC line was cut to length. A peel-away sheath was placed and a 31 cm, 5 [place], single lumen was  inserted to level of the superior caval-atrial junction. A post procedure spot fluoroscopic was obtained. The catheter easily aspirated and flushed and was sutured in place. A dressing was placed. The patient tolerated the procedure well without immediate post procedural complication. FINDINGS: After catheter placement, the tip lies within the superior cavoatrial junction. The catheter aspirates and flushes normally and is ready for immediate use. IMPRESSION: Successful ultrasound and fluoroscopic guided placement of a right basilic vein approach, 31 cm, 5 [place], single lumen PICC with tip at the superior caval-atrial junction. The PICC line is ready for immediate use. Electronically Signed   By: Sandi Mariscal M.D.   On: 04/16/2017 09:05   Ir US Guide Vasc Access Right  Result Date: 04/16/2017 INDICATION: History of rectal cancer. In need of intravenous access for chemotherapy administration. EXAM: ULTRASOUND AND FLUOROSCOPIC GUIDED PICC LINE INSERTION MEDICATIONS: None. CONTRAST:  None FLUOROSCOPY TIME:  6 seconds (1 mGy) COMPLICATIONS: None immediate. TECHNIQUE: The procedure, risks, benefits, and alternatives were explained to the patient and informed written consent was obtained. A timeout was performed prior to the initiation of the procedure. The right upper extremity was prepped with chlorhexidine in a sterile fashion, and a sterile drape was applied covering the operative field. Maximum barrier sterile technique with sterile gowns and gloves were used for the procedure. A timeout was performed prior to the initiation of the procedure. Local anesthesia was provided with 1% lidocaine. Under direct ultrasound guidance, the basilic vein was accessed with a micropuncture kit after the overlying soft tissues were anesthetized with 1% lidocaine. After the overlying soft tissues were anesthetized, a small venotomy incision was created and a micropuncture kit was utilized to access the right basilic vein. Real-time  ultrasound guidance was utilized for vascular access including the acquisition of a permanent ultrasound image documenting patency of the accessed vessel. A guidewire was advanced to the level of the superior caval-atrial junction for measurement purposes and the PICC line was cut to length. A peel-away sheath was placed and a 31 cm, 5 [place], single lumen was inserted to level of the superior caval-atrial junction. A post procedure spot fluoroscopic was obtained. The catheter easily aspirated and flushed and was sutured in place. A dressing was placed. The patient tolerated the procedure well without immediate post procedural complication. FINDINGS: After catheter placement, the tip lies within the superior cavoatrial junction. The catheter aspirates and flushes normally and is ready for immediate use. IMPRESSION:  Successful ultrasound and fluoroscopic guided placement of a right basilic vein approach, 31 cm, 5 French, single lumen PICC with tip at the superior caval-atrial junction. The PICC line is ready for immediate use. Electronically Signed   By: Simonne Come M.D.   On: 04/16/2017 09:05     PET 02/25/17 IMPRESSION: 1. Focal accentuated activity in the anus as detailed above, with bilateral external iliac hypermetabolic adenopathy indicating metastatic disease. No hypermetabolic lesion of the liver or other findings of metastatic disease. 2. The tiny nodule adjacent to the minor fissure does not demonstrate hypermetabolic activity but is below sensitive PET-CT size thresholds. This may warrant surveillance. 3. Other imaging findings of potential clinical significance: Aortic Atherosclerosis (ICD10-I70.0). Coronary atherosclerosis. Asymmetric right degenerative glenohumeral arthropathy. Small calcified thyroid nodules are present but not hypermetabolic.  ASSESSMENT & PLAN:  Denise Wolfe is here for a follow up. She has a past medical history of breast cancer in 2001, Anemia, Arthritis and HTN.     1. Prolonged neutropenia  -She has developed prolonged neutropenia after her first cycle chemotherapy, nadir ANC 0.2K, required multiple Granix during weekend when she was off RT, and had prolonged recovery  -recovered well now, ANC 3.1K today, will proceed chemo today with 5-FU only  -I previously spoke with radiation oncologist Dr. Mitzi Hansen, we'll try to keep her on radiation schedule as long as her neutropenia is not severe. I would consider prophylactic antibiotics if her ANC dropped to below 0.5K.    2. Squamous Cell Carcinoma of Rectum, [id number] -She underwent a screening colonoscopy 02/11/2017. Findings included a 12-15 mm polyp in the rectum, semi-sessile. A 2 mm polyp was found in the descending colon. Pathology on the rectal polyp showed squamous cell carcinoma, basaloid type. The descending colon polyp showed tubular adenoma -CT scans of the chest/abdomen/pelvis 02/19/2017 showed an abnormal right pelvic sidewall lymph node measuring 1.9 x 1.6 cm -We discussed her PET from 02/25/17. The PET shows enlarged hypermetabolic bilateral external iliac lymph nodes (2), no distant metastasis.  -Patient had a question if her pelvic and inguinal metastasis from her previous breast cancer. Were discussed this is unlikely, given the distribution of the lymph nodes which is more consistent with rectal cancer metastasis. Her previous breast cancer was diagnosed 17 years ago, hormonal sensitive, and she received a standard treatment. No evidence of recurrence since then. -Although she has locally advanced disease, this is still considered curable with concurrent chemotherapy and radiation. -I recommend a standard chemotherapy with 5-FU and mitomycin regimen, with concurrent radiation  -she tolerated the first week chemo well, he did develop severe and persistent neutropenia, and radiation was held for 3 days on week 3 -Due to her persistent neutropenia, week 4 chemotherapy has been postponed for two weeks   -She will proceed with second cycle chemo today, with 5-FU alone, will omit mitomycin due to her severe and prolonged neutropenia. If her counts drop below 0.5 I will give her prophylactic antibiotics.  -Labs reviewed and her HG is 9.3, but does not need blood transfusion yet. She is adequate to continue with treatment. -F/u next week      3. Hx of left Breast Cancer, stage 3 -Diagnosed in 2001 -Had mastectomy and ALND, adjuvant chemo therapy and radiation, took tamoxifen and aromasin -Mother diagnosed at age 62. Declines referral to genetics counselor. -We discussed that it is not likely that breast cancer has occurred in her rectum. -I previously recommend genetic testing due to her family history and personal history of  breast cancer. She will think about it -She is overdue for her mammogram, I will order an screening mammogram after her current treatment. Breast exam was normal on 03/25/2017  4. Hypertension - currently on Norvasc and Hyzaar. -BP was 164/59, we will repeat in infusion room today   5. Depression - currently on Zoloft.  6. Hypercholesterolemia -on Lipitor.  7. Anemia and neutropenia -Second to chemotherapy, neutropenia precautions reviewed with patient again. -Continue lab weekly   8. Mucositis, secondary to chemotherapy -Resolved.  PLAN:  -lab reviewed, will proceed with chemo, 5-fu only, no mitomycin  -Lab 9/6 and 9/13 -Lab and f/u on 9/10 and 9/17    No orders of the defined types were placed in this encounter.  All questions were answered. The patient knows to call the clinic with any problems, questions or concerns. No barriers to learning was detected.  I spent 20 minutes counseling the patient face to face. The total time spent in the appointment was 25 minutes and more than 50% was on counseling and review of test results  This document serves as a record of services personally performed by Truitt Merle, MD. It was created on her behalf by Joslyn Devon, a trained medical scribe. The creation of this record is based on the scribe's personal observations and the provider's statements to them. This document has been checked and approved by the attending provider.    Truitt Merle, MD 04/16/2017

## 2017-04-16 NOTE — Telephone Encounter (Signed)
Gave patient AVS and calendar of upcoming September appointments.  °

## 2017-04-16 NOTE — Progress Notes (Signed)
Per Dr. Burr Medico no Mitomycin today, Adrucil only. Per Dr. Burr Medico PICC to be removed Saturday 04/20/17 whem pump is removed.

## 2017-04-16 NOTE — Procedures (Signed)
Pre procedural Diagnosis: Poor venous access Post Procedural Diagnosis: Same  Successful placement of right basilic vein approach 31 cm single lumen PICC line with tip at the superior caval-atrial junction.    EBL: None  No immediate post procedural complication.  The PICC line is ready for immediate use.  Ronny Bacon, MD Pager #: [phone]

## 2017-04-17 ENCOUNTER — Ambulatory Visit: Payer: Medicare Other | Admitting: Hematology

## 2017-04-17 ENCOUNTER — Ambulatory Visit
Admission: RE | Admit: 2017-04-17 | Discharge: 2017-04-17 | Disposition: A | Discharge status: Home / Self Care | Payer: Medicare Other | Source: Ambulatory Visit | Attending: Radiation Oncology | Admitting: Radiation Oncology

## 2017-04-17 ENCOUNTER — Other Ambulatory Visit: Payer: Medicare Other

## 2017-04-17 ENCOUNTER — Ambulatory Visit: Payer: Medicare Other

## 2017-04-17 ENCOUNTER — Encounter: Payer: Self-pay | Admitting: Hematology

## 2017-04-17 DIAGNOSIS — Z923 Personal history of irradiation: Secondary | ICD-10-CM | POA: Diagnosis not present

## 2017-04-17 DIAGNOSIS — C218 Malignant neoplasm of overlapping sites of rectum, anus and anal canal: Secondary | ICD-10-CM | POA: Diagnosis not present

## 2017-04-17 DIAGNOSIS — F329 Major depressive disorder, single episode, unspecified: Secondary | ICD-10-CM | POA: Diagnosis not present

## 2017-04-17 DIAGNOSIS — Z853 Personal history of malignant neoplasm of breast: Secondary | ICD-10-CM | POA: Diagnosis not present

## 2017-04-17 DIAGNOSIS — C211 Malignant neoplasm of anal canal: Secondary | ICD-10-CM | POA: Diagnosis not present

## 2017-04-17 DIAGNOSIS — I10 Essential (primary) hypertension: Secondary | ICD-10-CM | POA: Diagnosis not present

## 2017-04-17 DIAGNOSIS — Z510 Encounter for antineoplastic radiation therapy: Secondary | ICD-10-CM | POA: Diagnosis not present

## 2017-04-17 NOTE — Progress Notes (Signed)
Patient came in per MD Burr Medico this AM to have her 5FU pump restarted. Pt's chemo infusion pump had stopped working yesterday in the late evening. Patient called the chemo infusion pump company, and the company recommended exchanging the chemo infusion pump. Pt's PICC line CDI with good blood return (see flowsheets). Pt's chemo infusion restarted per MD FENG at [phone] with rate of 3.0 mL/hr with 230 mL volume remaining. Chemo infusion pump double verified with Sallee Provencal, RN. Pt and daughter verbalize understanding to come in at 1300 on 9/8 per MD Burr Medico to have the chemo infusion pump removed. Pt agrees with plan of care.

## 2017-04-18 ENCOUNTER — Other Ambulatory Visit (HOSPITAL_BASED_OUTPATIENT_CLINIC_OR_DEPARTMENT_OTHER): Payer: Medicare Other

## 2017-04-18 ENCOUNTER — Ambulatory Visit
Admission: RE | Admit: 2017-04-18 | Discharge: 2017-04-18 | Disposition: A | Discharge status: Home / Self Care | Payer: Medicare Other | Source: Ambulatory Visit | Attending: Radiation Oncology | Admitting: Radiation Oncology

## 2017-04-18 DIAGNOSIS — Z853 Personal history of malignant neoplasm of breast: Secondary | ICD-10-CM | POA: Diagnosis not present

## 2017-04-18 DIAGNOSIS — F329 Major depressive disorder, single episode, unspecified: Secondary | ICD-10-CM | POA: Diagnosis not present

## 2017-04-18 DIAGNOSIS — Z923 Personal history of irradiation: Secondary | ICD-10-CM | POA: Diagnosis not present

## 2017-04-18 DIAGNOSIS — C20 Malignant neoplasm of rectum: Secondary | ICD-10-CM | POA: Diagnosis present

## 2017-04-18 DIAGNOSIS — C211 Malignant neoplasm of anal canal: Secondary | ICD-10-CM | POA: Diagnosis not present

## 2017-04-18 DIAGNOSIS — C218 Malignant neoplasm of overlapping sites of rectum, anus and anal canal: Secondary | ICD-10-CM | POA: Diagnosis not present

## 2017-04-18 DIAGNOSIS — C778 Secondary and unspecified malignant neoplasm of lymph nodes of multiple regions: Secondary | ICD-10-CM

## 2017-04-18 DIAGNOSIS — Z510 Encounter for antineoplastic radiation therapy: Secondary | ICD-10-CM | POA: Diagnosis not present

## 2017-04-18 DIAGNOSIS — I10 Essential (primary) hypertension: Secondary | ICD-10-CM | POA: Diagnosis not present

## 2017-04-18 LAB — CBC WITH DIFFERENTIAL/PLATELET
BASO%: 1.2 % (ref 0.0–2.0)
BASOS ABS: 0 10*3/uL (ref 0.0–0.1)
EOS%: 21.2 % — AB (ref 0.0–7.0)
Eosinophils Absolute: 0.5 10*3/uL (ref 0.0–0.5)
HCT: 29.3 % — ABNORMAL LOW (ref 34.8–46.6)
HEMOGLOBIN: 10.3 g/dL — AB (ref 11.6–15.9)
LYMPH#: 0.4 10*3/uL — AB (ref 0.9–3.3)
LYMPH%: 14 % (ref 14.0–49.7)
MCH: 32.8 pg (ref 25.1–34.0)
MCHC: 35.2 g/dL (ref 31.5–36.0)
MCV: 93.3 fL (ref 79.5–101.0)
MONO#: 0.7 10*3/uL (ref 0.1–0.9)
MONO%: 29.2 % — AB (ref 0.0–14.0)
NEUT#: 0.9 10*3/uL — ABNORMAL LOW (ref 1.5–6.5)
NEUT%: 34.4 % — AB (ref 38.4–76.8)
Platelets: 246 10*3/uL (ref 145–400)
RBC: 3.14 10*6/uL — ABNORMAL LOW (ref 3.70–5.45)
RDW: 15.5 % — ABNORMAL HIGH (ref 11.2–14.5)
WBC: 2.5 10*3/uL — ABNORMAL LOW (ref 3.9–10.3)
nRBC: 0 % (ref 0–0)

## 2017-04-19 ENCOUNTER — Ambulatory Visit
Admission: RE | Admit: 2017-04-19 | Discharge: 2017-04-19 | Disposition: A | Discharge status: Home / Self Care | Payer: Medicare Other | Source: Ambulatory Visit | Attending: Radiation Oncology | Admitting: Radiation Oncology

## 2017-04-19 ENCOUNTER — Other Ambulatory Visit: Payer: Self-pay | Admitting: *Deleted

## 2017-04-19 ENCOUNTER — Other Ambulatory Visit: Payer: Self-pay

## 2017-04-19 DIAGNOSIS — Z510 Encounter for antineoplastic radiation therapy: Secondary | ICD-10-CM | POA: Diagnosis not present

## 2017-04-19 DIAGNOSIS — Z923 Personal history of irradiation: Secondary | ICD-10-CM | POA: Diagnosis not present

## 2017-04-19 DIAGNOSIS — C218 Malignant neoplasm of overlapping sites of rectum, anus and anal canal: Secondary | ICD-10-CM | POA: Diagnosis not present

## 2017-04-19 DIAGNOSIS — Z853 Personal history of malignant neoplasm of breast: Secondary | ICD-10-CM | POA: Diagnosis not present

## 2017-04-19 DIAGNOSIS — C211 Malignant neoplasm of anal canal: Secondary | ICD-10-CM | POA: Diagnosis not present

## 2017-04-19 DIAGNOSIS — F329 Major depressive disorder, single episode, unspecified: Secondary | ICD-10-CM | POA: Diagnosis not present

## 2017-04-19 DIAGNOSIS — I10 Essential (primary) hypertension: Secondary | ICD-10-CM | POA: Diagnosis not present

## 2017-04-19 NOTE — Progress Notes (Addendum)
[place]  [phone] [phone]  Clinic Follow up Note   Patient Care Team: [name], DO as PCP - General (Family Medicine) Silverio Decamp, MD as Consulting Physician (Family Medicine) 04/22/2017   SUMMARY OF ONCOLOGIC HISTORY:   Squamous cell carcinoma of rectum ([place])   02/11/2017 Procedure    Colonoscopy 02/11/17 IMPRESSION - One 12 to 15 mm polyp in the rectum (vs proximal anus). Biopsied. Firm, - just inside anal verge or on it? - One 2 mm polyp in the descending colon, removed with a cold biopsy forceps. Resected and retrieved. - The examination was otherwise normal on direct and retroflexion views.       02/11/2017 Pathology Results    Diagnosis 02/11/17 1. Colon, biopsy, descending, polyp - TUBULAR ADENOMA (ONE FRAGMENT). - NO HIGH GRADE DYSPLASIA OR MALIGNANCY. 2. Colon, polyp(s), rectum, polyp - biopsy only - SQUAMOUS CELL CARCINOMA, BASALOID-TYPE. - SEE MICROSCOPIC DESCRIPTION. Microscopic Comment 2. There are multiple fragments of rectal mucosa which are involved by poorly differentiated squamous cell carcinoma with basaloid features.      02/14/2017 Initial Diagnosis    Squamous cell carcinoma of rectum ([place])     02/20/2017 Imaging    CT CAP W Contrast 02/20/17 IMPRESSION: 1. Abnormal right pelvic sidewall lymph node measuring 1.9 x 1.6 cm. Imaging features highly suspicious for metastatic disease. PET-CT may prove helpful to further evaluate. Borderline lymph node identified at the right iliac bifurcation and an upper normal size lymph node is identified along the left external iliac artery chain. 2. Asymmetric wall thickening distal rectum, presumably the location of primary neoplasm. 3. Asymmetric airspace opacity in the left apex has a wedge shaped configuration on coronal imaging. This may be an area of scarring or potentially infectious/inflammatory etiology. Attention on follow-up recommended. 4.  Tiny right perifissural nodule in the right middle lobe, likely subpleural lymph node but attention on follow-up recommended. 5. Multiple low-density well-defined liver lesions, most suggestive of cysts. 6.  Aortic Atherosclerois (ICD10-170.0)      02/25/2017 PET scan    PET 02/25/17 IMPRESSION: 1. Focal accentuated activity in the anus as detailed above, with bilateral external iliac hypermetabolic adenopathy indicating metastatic disease. No hypermetabolic lesion of the liver or other findings of metastatic disease. 2. The tiny nodule adjacent to the minor fissure does not demonstrate hypermetabolic activity but is below sensitive PET-CT size thresholds. This may warrant surveillance. 3. Other imaging findings of potential clinical significance: Aortic Atherosclerosis (ICD10-I70.0). Coronary atherosclerosis. Asymmetric right degenerative glenohumeral arthropathy. Small calcified thyroid nodules are present but not hypermetabolic.      03/11/2017 -  Radiation Therapy    Concurrent Chemo Radiation with Dr. Lisbeth Renshaw      03/11/2017 -  Chemotherapy    Concurrent Chemo radiation with with mitomycin C and 5-fluorouracil.        CURRENT THERAPY:  Concurrent chemotherapoy and Radiation with mitomycin C on Day 1 and 29 and 5-fluorouracil on day 1-5, 29-33, received 5FU only for cycle 2 due to prolonged neutropenia.  INTERVAL HISTORY:   Denise Wolfe returns for follow-up today as scheduled, accompanied by her daughter. She received cycle 2 from 04/16/2017 to 04/20/2017 with 5-FU alone. Mitomycin was held due to prolonged neutropenia. ANC was 3.1 on day 1 of cycle 2 which was adequate for treatment; she did not require grand next with pump DC. She had a pump malfunction during this cycle but was remedied and the rate was appropriately changed to finish at the intended  time. Today she is very fatigued, did not leave the house all weekend. Her appetite is poor but she is eating and drinking. She  has to force herself to eat, and notes a metallic taste in her mouth. She has some oral pain. Has Magic mouthwash on her medication list but has not used it recently, she reports it worsened her mouth sores last time she used it. She has rectal pain with prolonged sitting but cannot rate on a 0-10 scale. No nausea, vomiting, constipation, or diarrhea. She has painful bowel movements due to skin irritation, no rectal bleeding. No fever or chills.   REVIEW OF SYSTEMS:    Constitutional: Denies fevers, chills or abnormal weight loss (+) fatigue Eyes: Denies blurriness of vision Ears, nose, mouth, throat, and face: Denies sore throat. (+) Upper right mouth pain. Respiratory: Denies cough, dyspnea or wheezes Cardiovascular: Denies palpitation, chest discomfort or lower extremity swelling Gastrointestinal:  Denies nausea, vomiting, constipation, diarrhea, heartburn or change in bowel habits. Denies blood in stool, denies rectal bleeding (+) painful bowel movements  Skin: Denies skin rashes. (+) Radiation skin changes including redness and pain to pelvic and rectal areas Lymphatics: Denies new lymphadenopathy or easy bruising Neurological:Denies numbness, tingling or new weaknesses Behavioral/Psych: Mood is stable, no new changes  All other systems were reviewed with the patient and are negative.  MEDICAL HISTORY:  Past Medical History:  Diagnosis Date  . Anemia    during childhood  . Arthritis    hands, shoulder  . Breast cancer ([place]) 03/2000   left side modified mastecomy  . Cancer [facility])    breast, age 60; left side  . Colon cancer ([place]) 02/11/2017   rectum-squamous cell carcinoma  . Depression   . Hypertension   . Squamous cell carcinoma of rectum ([place]) 02/14/2017   SURGICAL HISTORY: Past Surgical History:  Procedure Laterality Date  . ABDOMINAL HYSTERECTOMY     With bilateral oophorectomy. 2002.  Marland Kitchen BREAST BIOPSY Right 2004   benign  . BREAST SURGERY Left 2001   biopsy  . CATARACT  EXTRACTION Bilateral 2017  . IR FLUORO GUIDE CV LINE RIGHT  03/11/2017  . IR FLUORO GUIDE CV LINE RIGHT  04/16/2017  . IR US GUIDE VASC ACCESS RIGHT  03/11/2017  . IR US GUIDE VASC ACCESS RIGHT  04/16/2017  . MASTECTOMY MODIFIED RADICAL Left 2001  . MENISCUS DEBRIDEMENT Right 2003  . TUMOR EXCISION Right    thigh area   I have reviewed the social history and family history with the patient and they are unchanged from previous note.  ALLERGIES:  is allergic to ibuprofen.  MEDICATIONS:  Current Outpatient Prescriptions  Medication Sig Dispense Refill  . amLODipine (NORVASC) 5 MG tablet Take 1 tablet (5 mg total) by mouth daily. 90 tablet 3  . amphetamine-dextroamphetamine (ADDERALL) 10 MG tablet Take 1 tablet (10 mg total) by mouth 2 (two) times daily. 60 tablet 0  . atorvastatin (LIPITOR) 80 MG tablet Take 0.5 tablets (40 mg total) by mouth daily at 6 PM. 45 tablet 3  . cholecalciferol (VITAMIN D) 400 units TABS tablet Take 400 Units by mouth daily.    Marland Kitchen losartan-hydrochlorothiazide (HYZAAR) 50-12.5 MG tablet Start with one half tablet daily for 1 week, then 1 full tablet daily (Patient taking differently: Take 1 tablet by mouth daily. Start with one half tablet daily for 1 week, then 1 full tablet daily) 90 tablet 3  . magic mouthwash SOLN Swish and spit 5-10 mls by mouth four times  daily as needed. 240 mL 1  . sertraline (ZOLOFT) 50 MG tablet Take 1 tablet (50 mg total) by mouth daily. 90 tablet 3  . ondansetron (ZOFRAN) 8 MG tablet Take 1 tablet (8 mg total) by mouth every 8 (eight) hours as needed for nausea or vomiting. (Patient not taking: Reported on 03/25/2017) 20 tablet 0  . prochlorperazine (COMPAZINE) 10 MG tablet Take 1 tablet (10 mg total) by mouth every 6 (six) hours as needed for nausea or vomiting. (Patient not taking: Reported on 03/25/2017) 30 tablet 0   Current Facility-Administered Medications  Medication Dose Route Frequency Provider Last Rate Last Dose  . 0.9 %  sodium  chloride infusion  500 mL Intravenous Continuous [name], MD       PHYSICAL EXAMINATION: ECOG PERFORMANCE STATUS: 2 BP 126/62 (BP Location: Right Arm, Patient Position: Sitting)   Pulse 86   Temp (!) 97.5 F (36.4 C) (Oral)   Resp 18   Ht 5\' 2"  (1.575 m)   Wt 133 lb 1.6 oz (60.4 kg)   SpO2 100%   BMI 24.34 kg/m  GENERAL:alert, no distress and comfortable; weight overall stable SKIN: skin color, texture, turgor are normal, no rashes. (+) Radiation skin changes including redness to pelvis and perineum   EYES: normal, Conjunctiva are pink and non-injected, sclera anicteric. OROPHARYNX:no oropharyngeal exudate. Lips are pink, dry. No thrush. (+) Erythema with ulceration to right upper buccal mucosa; small area of erythema to left edge of tongue.  NECK: supple, thyroid normal size, non-tender, without nodularity.  LYMPH:  no palpable cervical, supraclavicular, or inguinal lymphadenopathy LUNGS: clear to auscultation bilaterally; normal breathing effort HEART: regular rate & rhythm, S1 and S2 present, no murmurs; no lower extremity edema ABDOMEN:abdomen soft, flat, non-tender; normal bowel sounds, no palpable hepatosplenomegaly or masses. Musculoskeletal:no cyanosis of digits and no clubbing  NEURO: alert & oriented x 3 with fluent speech, no focal motor/sensory deficits RECTAL: (+) external skin erythematous with dry desquamation to anus. Digital rectal exam deferred today.  LABORATORY DATA:  I have reviewed the data as listed  CBC Latest Ref Rng & Units 04/22/2017 04/18/2017 04/16/2017  WBC 3.9 - 10.3 10e3/uL 1.8(L) 2.5(L) 5.0  Hemoglobin 11.6 - 15.9 g/dL 10.0(L) 10.3(L) 9.3(L)  Hematocrit 34.8 - 46.6 % 28.2(L) 29.3(L) 27.3(L)  Platelets 145 - 400 10e3/uL 250 246 279   CMP Latest Ref Rng & Units 04/16/2017 04/12/2017 04/08/2017  Glucose 70 - 140 mg/dl 99 81 91  BUN 7.0 - 26.0 mg/dL 15.1 20.4 21.6  Creatinine 0.6 - 1.1 mg/dL 0.7 0.7 0.7  Sodium 136 - 145 mEq/L 140 139 139  Potassium  3.5 - 5.1 mEq/L 3.8 4.1 4.2  Chloride 96 - 112 mEq/L - - -  CO2 22 - 29 mEq/L 25 26 26   Calcium 8.4 - 10.4 mg/dL 9.3 9.6 9.4  Total Protein 6.4 - 8.3 g/dL 6.1(L) 6.4 6.4  Total Bilirubin 0.20 - 1.20 mg/dL 0.24 0.27 0.26  Alkaline Phos 40 - 150 U/L 46 41 43  AST 5 - 34 U/L 14 12 13   ALT 0 - 55 U/L 9 8 9    ANC 1.3k TODAY   PATHOLOGY Diagnosis 02/11/17 1. Colon, biopsy, descending, polyp - TUBULAR ADENOMA (ONE FRAGMENT). - NO HIGH GRADE DYSPLASIA OR MALIGNANCY. 2. Colon, polyp(s), rectum, polyp - biopsy only - SQUAMOUS CELL CARCINOMA, BASALOID-TYPE. - SEE MICROSCOPIC DESCRIPTION. Microscopic Comment 2. There are multiple fragments of rectal mucosa which are involved by poorly differentiated squamous cell carcinoma with basaloid features.  PROCEDURES Colonoscopy 02/11/17 IMPRESSION - One 12 to 15 mm polyp in the rectum (vs proximal anus). Biopsied. Firm, - just inside anal verge or on it? - One 2 mm polyp in the descending colon, removed with a cold biopsy forceps. Resected and retrieved. - The examination was otherwise normal on direct and retroflexion views.  RADIOGRAPHIC STUDIES: I have personally reviewed the radiological images as listed and agreed with the findings in the report. No results found.   PET 02/25/17 IMPRESSION: 1. Focal accentuated activity in the anus as detailed above, with bilateral external iliac hypermetabolic adenopathy indicating metastatic disease. No hypermetabolic lesion of the liver or other findings of metastatic disease. 2. The tiny nodule adjacent to the minor fissure does not demonstrate hypermetabolic activity but is below sensitive PET-CT size thresholds. This may warrant surveillance. 3. Other imaging findings of potential clinical significance: Aortic Atherosclerosis (ICD10-I70.0). Coronary atherosclerosis. Asymmetric right degenerative glenohumeral arthropathy. Small calcified thyroid nodules are present but not  hypermetabolic.  ASSESSMENT & PLAN:  Denise Wolfe is here for a follow up. She has a past medical history of breast cancer in 2001, Anemia, Arthritis and HTN. Undergoing concurrent chemoradiation.  1. Prolonged neutropenia  -She has developed prolonged neutropenia after her first cycle chemotherapy, nadir ANC 0.2K, required multiple Granix during weekend when she was off RT, and had prolonged recovery  -ANC increased to 3.1K (04/16/17), the patient received cycle 2 with 5-FU only  -Dr. Burr Medico and Dr. Lisbeth Renshaw agree to keep the patient on her radiation schedule as long as neutropenia is not severe. She is due to complete radiation on 04/26/2017.  -Dr. Burr Medico recommends prophylactic antibiotics if her [place] drops below 0.5K.  -Lab reviewed, ANC 1.3K today, continue monitoring   2. Squamous Cell Carcinoma of Rectum, [id number] -She underwent a screening colonoscopy 02/11/2017. Findings included a 12-15 mm polyp in the rectum, semi-sessile. A 2 mm polyp was found in the descending colon. Pathology on the rectal polyp showed squamous cell carcinoma, basaloid type. The descending colon polyp showed tubular adenoma -CT scans of the chest/abdomen/pelvis 02/19/2017 showed an abnormal right pelvic sidewall lymph node measuring 1.9 x 1.6 cm -M.D. previously discussed her PET from 02/25/17 which shows enlarged hypermetabolic bilateral external iliac lymph nodes (2), no distant metastasis.  -Patient had a question if her pelvic and inguinal metastasis from her previous breast cancer. Dr. Burr Medico previously discussed this is unlikely, given the distribution of the lymph nodes which is more consistent with rectal cancer metastasis. Her previous breast cancer was diagnosed 17 years ago, hormonal sensitive, and she received a standard treatment. No evidence of recurrence since then. -Although she has locally advanced disease, this is still considered curable with concurrent chemotherapy and radiation. -Dr. Burr Medico recommend a standard  chemotherapy with 5-FU and mitomycin regimen, with concurrent radiation  -she tolerated the first week chemo well but developed severe and persistent neutropenia, and radiation was held for 3 days on week 3 -Day 28 chemotherapy was postponed for 2 weeks due to persistent neutropenia -She received cycle 2 chemotherapy with 5-FU alone from 04/16/2017 to 04/20/2017  -Her PICC line was pulled in the infusion room following pump DC on 04/20/2017 without difficulty.  -He is overall tolerating chemotherapy and radiation moderately well, has significant fatigue and mild pain, manageable, continue current treatment, this is her last week of radiation.  3. Hx of left Breast Cancer, stage 3 -Diagnosed in 2001 -Had mastectomy and ALND, adjuvant chemo therapy and radiation, took tamoxifen and aromasin -Mother diagnosed at age  63. Declines referral to genetics counselor. -M.D. previously discussed that it is not likely that breast cancer has occurred in her rectum. -M.D. previously recommend genetic testing due to her family history and personal history of breast cancer. She will think about it -She is overdue for her mammogram, will order a screening mammogram after her current treatment. Breast exam was normal on 03/25/2017  4. Hypertension -currently on Norvasc and Hyzaar. -BP 126/62 today, stable  5. Depression -currently on Zoloft.  6. Hypercholesterolemia -on Lipitor.  7. Anemia and neutropenia -Second to chemotherapy, neutropenia precautions reviewed with patient again. -Continue lab weekly -Hemoglobin stable at 10.0 today, no transfusion required  8. Mucositis, secondary to chemotherapy -She has Magic mouthwash at home but does not wish to use it at this time -Encouraged to rinse with salt and warm water periodically throughout the day -She will notify us if this fails to improve or worsens or she develops new mucositis  9. Rectal pain -She denies needing pain medication at this  time -She may use Tylenol when necessary  PLAN:  -Warm salt water rinses for mucositis -Labs on 04/25/2017 -Labs and follow-up on 04/29/2017  No orders of the defined types were placed in this encounter.  All questions were answered. The patient knows to call the clinic with any problems, questions or concerns. No barriers to learning was detected.  I spent 20 minutes counseling the patient face to face. The total time spent in the appointment was 25 minutes and more than 50% was on counseling and review of test results  Cira Rue, AGNP-C 04/22/2017  I have seen the patient, examined her. I agree with the assessment and and plan and have edited the notes.    This document serves as a record of services personally performed by Truitt Merle, MD. It was created on her behalf by Joslyn Devon, a trained medical scribe. The creation of this record is based on the scribe's personal observations and the provider's statements to them. This document has been checked and approved by the attending provider.    Truitt Merle, MD 04/22/2017

## 2017-04-20 ENCOUNTER — Ambulatory Visit (HOSPITAL_BASED_OUTPATIENT_CLINIC_OR_DEPARTMENT_OTHER): Payer: Medicare Other

## 2017-04-20 VITALS — BP 167/86 | HR 98 | Temp 98.3°F | Resp 18

## 2017-04-20 DIAGNOSIS — Z452 Encounter for adjustment and management of vascular access device: Secondary | ICD-10-CM | POA: Diagnosis present

## 2017-04-20 DIAGNOSIS — C20 Malignant neoplasm of rectum: Secondary | ICD-10-CM | POA: Diagnosis not present

## 2017-04-20 MED ORDER — SODIUM CHLORIDE 0.9% FLUSH
10.0000 mL | INTRAVENOUS | Status: DC | PRN
  Administered 2017-04-20: 10 mL via INTRAVENOUS
  Filled 2017-04-20: qty 10

## 2017-04-20 NOTE — Progress Notes (Signed)
Patient refused Granix injection.  PICC line was removed without complication by Royce Macadamia, RN. Catheter in tact, 31cm measured. Patient remained supine for 30 minutes post removal. Patient discharged from infusion room. Vital signs stable.

## 2017-04-22 ENCOUNTER — Other Ambulatory Visit (HOSPITAL_BASED_OUTPATIENT_CLINIC_OR_DEPARTMENT_OTHER): Payer: Medicare Other

## 2017-04-22 ENCOUNTER — Ambulatory Visit
Admission: RE | Admit: 2017-04-22 | Discharge: 2017-04-22 | Disposition: A | Discharge status: Home / Self Care | Payer: Medicare Other | Source: Ambulatory Visit | Attending: Radiation Oncology | Admitting: Radiation Oncology

## 2017-04-22 ENCOUNTER — Ambulatory Visit: Payer: Medicare Other

## 2017-04-22 ENCOUNTER — Encounter: Payer: Self-pay | Admitting: Nurse Practitioner

## 2017-04-22 ENCOUNTER — Ambulatory Visit (HOSPITAL_BASED_OUTPATIENT_CLINIC_OR_DEPARTMENT_OTHER): Payer: Medicare Other | Admitting: Nurse Practitioner

## 2017-04-22 VITALS — BP 126/62 | HR 86 | Temp 97.5°F | Resp 18 | Ht 62.0 in | Wt 133.1 lb

## 2017-04-22 DIAGNOSIS — C218 Malignant neoplasm of overlapping sites of rectum, anus and anal canal: Secondary | ICD-10-CM | POA: Diagnosis not present

## 2017-04-22 DIAGNOSIS — T451X5A Adverse effect of antineoplastic and immunosuppressive drugs, initial encounter: Secondary | ICD-10-CM

## 2017-04-22 DIAGNOSIS — D6481 Anemia due to antineoplastic chemotherapy: Secondary | ICD-10-CM | POA: Diagnosis not present

## 2017-04-22 DIAGNOSIS — F329 Major depressive disorder, single episode, unspecified: Secondary | ICD-10-CM

## 2017-04-22 DIAGNOSIS — C211 Malignant neoplasm of anal canal: Secondary | ICD-10-CM | POA: Diagnosis not present

## 2017-04-22 DIAGNOSIS — R5383 Other fatigue: Secondary | ICD-10-CM | POA: Diagnosis not present

## 2017-04-22 DIAGNOSIS — D701 Agranulocytosis secondary to cancer chemotherapy: Secondary | ICD-10-CM | POA: Diagnosis not present

## 2017-04-22 DIAGNOSIS — Z923 Personal history of irradiation: Secondary | ICD-10-CM | POA: Diagnosis not present

## 2017-04-22 DIAGNOSIS — C20 Malignant neoplasm of rectum: Secondary | ICD-10-CM

## 2017-04-22 DIAGNOSIS — I10 Essential (primary) hypertension: Secondary | ICD-10-CM | POA: Diagnosis not present

## 2017-04-22 DIAGNOSIS — Z853 Personal history of malignant neoplasm of breast: Secondary | ICD-10-CM

## 2017-04-22 DIAGNOSIS — C778 Secondary and unspecified malignant neoplasm of lymph nodes of multiple regions: Secondary | ICD-10-CM

## 2017-04-22 DIAGNOSIS — Z510 Encounter for antineoplastic radiation therapy: Secondary | ICD-10-CM | POA: Diagnosis not present

## 2017-04-22 DIAGNOSIS — K1230 Oral mucositis (ulcerative), unspecified: Secondary | ICD-10-CM | POA: Diagnosis not present

## 2017-04-22 DIAGNOSIS — K6289 Other specified diseases of anus and rectum: Secondary | ICD-10-CM | POA: Diagnosis not present

## 2017-04-22 DIAGNOSIS — C801 Malignant (primary) neoplasm, unspecified: Secondary | ICD-10-CM

## 2017-04-22 LAB — CBC WITH DIFFERENTIAL/PLATELET
BASO%: 1.1 % (ref 0.0–2.0)
Basophils Absolute: 0 10*3/uL (ref 0.0–0.1)
EOS%: 11.5 % — ABNORMAL HIGH (ref 0.0–7.0)
Eosinophils Absolute: 0.2 10*3/uL (ref 0.0–0.5)
HCT: 28.2 % — ABNORMAL LOW (ref 34.8–46.6)
HGB: 10 g/dL — ABNORMAL LOW (ref 11.6–15.9)
LYMPH%: 10.4 % — AB (ref 14.0–49.7)
MCH: 33 pg (ref 25.1–34.0)
MCHC: 35.5 g/dL (ref 31.5–36.0)
MCV: 93.1 fL (ref 79.5–101.0)
MONO#: 0.1 10*3/uL (ref 0.1–0.9)
MONO%: 7.1 % (ref 0.0–14.0)
NEUT#: 1.3 10*3/uL — ABNORMAL LOW (ref 1.5–6.5)
NEUT%: 69.9 % (ref 38.4–76.8)
Platelets: 250 10*3/uL (ref 145–400)
RBC: 3.03 10*6/uL — AB (ref 3.70–5.45)
RDW: 15.3 % — ABNORMAL HIGH (ref 11.2–14.5)
WBC: 1.8 10*3/uL — AB (ref 3.9–10.3)
lymph#: 0.2 10*3/uL — ABNORMAL LOW (ref 0.9–3.3)

## 2017-04-23 ENCOUNTER — Ambulatory Visit: Payer: Medicare Other

## 2017-04-23 ENCOUNTER — Ambulatory Visit
Admission: RE | Admit: 2017-04-23 | Discharge: 2017-04-23 | Disposition: A | Discharge status: Home / Self Care | Payer: Medicare Other | Source: Ambulatory Visit | Attending: Radiation Oncology | Admitting: Radiation Oncology

## 2017-04-23 ENCOUNTER — Other Ambulatory Visit: Payer: Self-pay | Admitting: Radiation Oncology

## 2017-04-23 DIAGNOSIS — I10 Essential (primary) hypertension: Secondary | ICD-10-CM | POA: Diagnosis not present

## 2017-04-23 DIAGNOSIS — C218 Malignant neoplasm of overlapping sites of rectum, anus and anal canal: Secondary | ICD-10-CM | POA: Diagnosis not present

## 2017-04-23 DIAGNOSIS — C211 Malignant neoplasm of anal canal: Secondary | ICD-10-CM | POA: Diagnosis not present

## 2017-04-23 DIAGNOSIS — Z510 Encounter for antineoplastic radiation therapy: Secondary | ICD-10-CM | POA: Diagnosis not present

## 2017-04-23 DIAGNOSIS — F329 Major depressive disorder, single episode, unspecified: Secondary | ICD-10-CM | POA: Diagnosis not present

## 2017-04-23 DIAGNOSIS — Z853 Personal history of malignant neoplasm of breast: Secondary | ICD-10-CM | POA: Diagnosis not present

## 2017-04-23 DIAGNOSIS — Z923 Personal history of irradiation: Secondary | ICD-10-CM | POA: Diagnosis not present

## 2017-04-23 MED ORDER — PRAMOXINE HCL 1 % RE FOAM: 1.0000 "application " | Freq: Three times a day (TID) | RECTAL | 1 refills | Status: DC | PRN

## 2017-04-23 NOTE — Progress Notes (Signed)
Patient came in stating that she has a lot of pain when she has a bile movement. It does not matter if the stool is loose or form she has pain. Requested to be seen .Worthy Flank PA seen the patient and prescribed her medication for the pain.

## 2017-04-24 ENCOUNTER — Ambulatory Visit: Payer: Medicare Other

## 2017-04-24 ENCOUNTER — Ambulatory Visit
Admission: RE | Admit: 2017-04-24 | Discharge: 2017-04-24 | Disposition: A | Discharge status: Home / Self Care | Payer: Medicare Other | Source: Ambulatory Visit | Attending: Radiation Oncology | Admitting: Radiation Oncology

## 2017-04-24 DIAGNOSIS — C211 Malignant neoplasm of anal canal: Secondary | ICD-10-CM | POA: Diagnosis not present

## 2017-04-24 DIAGNOSIS — C218 Malignant neoplasm of overlapping sites of rectum, anus and anal canal: Secondary | ICD-10-CM | POA: Diagnosis not present

## 2017-04-24 DIAGNOSIS — I10 Essential (primary) hypertension: Secondary | ICD-10-CM | POA: Diagnosis not present

## 2017-04-24 DIAGNOSIS — F329 Major depressive disorder, single episode, unspecified: Secondary | ICD-10-CM | POA: Diagnosis not present

## 2017-04-24 DIAGNOSIS — Z923 Personal history of irradiation: Secondary | ICD-10-CM | POA: Diagnosis not present

## 2017-04-24 DIAGNOSIS — Z853 Personal history of malignant neoplasm of breast: Secondary | ICD-10-CM | POA: Diagnosis not present

## 2017-04-24 DIAGNOSIS — Z510 Encounter for antineoplastic radiation therapy: Secondary | ICD-10-CM | POA: Diagnosis not present

## 2017-04-25 ENCOUNTER — Telehealth: Payer: Self-pay | Admitting: *Deleted

## 2017-04-25 ENCOUNTER — Ambulatory Visit: Payer: Medicare Other

## 2017-04-25 ENCOUNTER — Ambulatory Visit
Admission: RE | Admit: 2017-04-25 | Discharge: 2017-04-25 | Disposition: A | Discharge status: Home / Self Care | Payer: Medicare Other | Source: Ambulatory Visit | Attending: Radiation Oncology | Admitting: Radiation Oncology

## 2017-04-25 ENCOUNTER — Encounter: Payer: Self-pay | Admitting: Radiation Oncology

## 2017-04-25 ENCOUNTER — Telehealth: Payer: Self-pay | Admitting: Hematology

## 2017-04-25 ENCOUNTER — Other Ambulatory Visit: Payer: Self-pay | Admitting: *Deleted

## 2017-04-25 ENCOUNTER — Other Ambulatory Visit (HOSPITAL_BASED_OUTPATIENT_CLINIC_OR_DEPARTMENT_OTHER): Payer: Medicare Other

## 2017-04-25 DIAGNOSIS — I10 Essential (primary) hypertension: Secondary | ICD-10-CM | POA: Diagnosis not present

## 2017-04-25 DIAGNOSIS — T451X5A Adverse effect of antineoplastic and immunosuppressive drugs, initial encounter: Principal | ICD-10-CM

## 2017-04-25 DIAGNOSIS — C211 Malignant neoplasm of anal canal: Secondary | ICD-10-CM | POA: Diagnosis not present

## 2017-04-25 DIAGNOSIS — C778 Secondary and unspecified malignant neoplasm of lymph nodes of multiple regions: Secondary | ICD-10-CM | POA: Diagnosis not present

## 2017-04-25 DIAGNOSIS — F329 Major depressive disorder, single episode, unspecified: Secondary | ICD-10-CM | POA: Diagnosis not present

## 2017-04-25 DIAGNOSIS — C20 Malignant neoplasm of rectum: Secondary | ICD-10-CM | POA: Diagnosis present

## 2017-04-25 DIAGNOSIS — Z923 Personal history of irradiation: Secondary | ICD-10-CM | POA: Diagnosis not present

## 2017-04-25 DIAGNOSIS — Z510 Encounter for antineoplastic radiation therapy: Secondary | ICD-10-CM | POA: Diagnosis not present

## 2017-04-25 DIAGNOSIS — D701 Agranulocytosis secondary to cancer chemotherapy: Secondary | ICD-10-CM

## 2017-04-25 DIAGNOSIS — C218 Malignant neoplasm of overlapping sites of rectum, anus and anal canal: Secondary | ICD-10-CM | POA: Diagnosis not present

## 2017-04-25 DIAGNOSIS — Z853 Personal history of malignant neoplasm of breast: Secondary | ICD-10-CM | POA: Diagnosis not present

## 2017-04-25 LAB — CBC WITH DIFFERENTIAL/PLATELET
BASO%: 0.5 % (ref 0.0–2.0)
Basophils Absolute: 0 10*3/uL (ref 0.0–0.1)
EOS%: 25.7 % — AB (ref 0.0–7.0)
Eosinophils Absolute: 0.2 10*3/uL (ref 0.0–0.5)
HEMATOCRIT: 26.2 % — AB (ref 34.8–46.6)
HEMOGLOBIN: 9.3 g/dL — AB (ref 11.6–15.9)
LYMPH#: 0.1 10*3/uL — AB (ref 0.9–3.3)
LYMPH%: 14.2 % (ref 14.0–49.7)
MCH: 33.8 pg (ref 25.1–34.0)
MCHC: 35.6 g/dL (ref 31.5–36.0)
MCV: 94.9 fL (ref 79.5–101.0)
MONO#: 0.3 10*3/uL (ref 0.1–0.9)
MONO%: 28 % — ABNORMAL HIGH (ref 0.0–14.0)
NEUT%: 31.6 % — ABNORMAL LOW (ref 38.4–76.8)
NEUTROS ABS: 0.3 10*3/uL — AB (ref 1.5–6.5)
PLATELETS: 289 10*3/uL (ref 145–400)
RBC: 2.76 10*6/uL — ABNORMAL LOW (ref 3.70–5.45)
RDW: 16.3 % — AB (ref 11.2–14.5)
WBC: 0.9 10*3/uL — AB (ref 3.9–10.3)

## 2017-04-25 LAB — COMPREHENSIVE METABOLIC PANEL
ALBUMIN: 3.7 g/dL (ref 3.5–5.0)
AST: 13 U/L (ref 5–34)
Alkaline Phosphatase: 38 U/L — ABNORMAL LOW (ref 40–150)
Anion Gap: 8 mEq/L (ref 3–11)
BILIRUBIN TOTAL: 0.32 mg/dL (ref 0.20–1.20)
BUN: 18.2 mg/dL (ref 7.0–26.0)
CALCIUM: 9.4 mg/dL (ref 8.4–10.4)
CHLORIDE: 105 meq/L (ref 98–109)
CO2: 26 mEq/L (ref 22–29)
CREATININE: 0.7 mg/dL (ref 0.6–1.1)
EGFR: 82 mL/min/{1.73_m2} — ABNORMAL LOW (ref >90)
Glucose: 94 mg/dl (ref 70–140)
Potassium: 3.7 mEq/L (ref 3.5–5.1)
Sodium: 139 mEq/L (ref 136–145)
TOTAL PROTEIN: 6.4 g/dL (ref 6.4–8.3)

## 2017-04-25 MED ORDER — LEVOFLOXACIN 500 MG PO TABS: 500.0000 mg | ORAL_TABLET | Freq: Every day | ORAL | 0 refills | Status: DC

## 2017-04-25 NOTE — Telephone Encounter (Signed)
Dr. Burr Medico reviewed lab results done today.  Spoke with pt and family in radiation lobby.  Instructed pt to start Levaquin 500 mg daily  For  14 days as per Dr. Burr Medico.  Reinforced neutropenia precautions with pt and family.  Pt verbalized understanding.

## 2017-04-25 NOTE — Telephone Encounter (Signed)
No 9/10 los.   

## 2017-04-26 ENCOUNTER — Ambulatory Visit: Payer: Medicare Other

## 2017-04-26 NOTE — Progress Notes (Signed)
[place]  [phone] [phone]  Clinic Follow up Note   Patient Care Team: [name], DO as PCP - General (Family Medicine) Silverio Decamp, MD as Consulting Physician (Family Medicine) 04/16/2017  SUMMARY OF ONCOLOGIC HISTORY:   Squamous cell carcinoma of rectum ([place])   02/11/2017 Procedure    Colonoscopy 02/11/17 IMPRESSION - One 12 to 15 mm polyp in the rectum (vs proximal anus). Biopsied. Firm, - just inside anal verge or on it? - One 2 mm polyp in the descending colon, removed with a cold biopsy forceps. Resected and retrieved. - The examination was otherwise normal on direct and retroflexion views.       02/11/2017 Pathology Results    Diagnosis 02/11/17 1. Colon, biopsy, descending, polyp - TUBULAR ADENOMA (ONE FRAGMENT). - NO HIGH GRADE DYSPLASIA OR MALIGNANCY. 2. Colon, polyp(s), rectum, polyp - biopsy only - SQUAMOUS CELL CARCINOMA, BASALOID-TYPE. - SEE MICROSCOPIC DESCRIPTION. Microscopic Comment 2. There are multiple fragments of rectal mucosa which are involved by poorly differentiated squamous cell carcinoma with basaloid features.      02/14/2017 Initial Diagnosis    Squamous cell carcinoma of rectum ([place])     02/20/2017 Imaging    CT CAP W Contrast 02/20/17 IMPRESSION: 1. Abnormal right pelvic sidewall lymph node measuring 1.9 x 1.6 cm. Imaging features highly suspicious for metastatic disease. PET-CT may prove helpful to further evaluate. Borderline lymph node identified at the right iliac bifurcation and an upper normal size lymph node is identified along the left external iliac artery chain. 2. Asymmetric wall thickening distal rectum, presumably the location of primary neoplasm. 3. Asymmetric airspace opacity in the left apex has a wedge shaped configuration on coronal imaging. This may be an area of scarring or potentially infectious/inflammatory etiology. Attention on follow-up recommended. 4. Tiny  right perifissural nodule in the right middle lobe, likely subpleural lymph node but attention on follow-up recommended. 5. Multiple low-density well-defined liver lesions, most suggestive of cysts. 6.  Aortic Atherosclerois (ICD10-170.0)      02/25/2017 PET scan    PET 02/25/17 IMPRESSION: 1. Focal accentuated activity in the anus as detailed above, with bilateral external iliac hypermetabolic adenopathy indicating metastatic disease. No hypermetabolic lesion of the liver or other findings of metastatic disease. 2. The tiny nodule adjacent to the minor fissure does not demonstrate hypermetabolic activity but is below sensitive PET-CT size thresholds. This may warrant surveillance. 3. Other imaging findings of potential clinical significance: Aortic Atherosclerosis (ICD10-I70.0). Coronary atherosclerosis. Asymmetric right degenerative glenohumeral arthropathy. Small calcified thyroid nodules are present but not hypermetabolic.      03/11/2017 - 04/25/2017 Radiation Therapy    Concurrent Chemo Radiation with Dr. Lisbeth Renshaw      03/11/2017 - 04/25/2017 Chemotherapy    Concurrent chemotherapoy and Radiation with mitomycin C on Day 1 and 29 and 5-fluorouracil on day 1-5, 29-33, will no longer include mitomycin starting with cycle 2.         CURRENT THERAPY:  Surveillance     INTERVAL HISTORY:   Denise Wolfe is here for a follow up. She presents to the clinic today accompanied by her daughter and son. She completed her radiation and felt horrible. She went to the ER because she develop a worsening dry cough. Originated from a post-nasal drip. She did not have a fever, yesterday she was up to 99.9 F. She is taking her anti-biotics. The cough is still there and constant. Her son says it got worse. She has tightness in her chest  form her muscles due to coughing. She takes tea spoon on honey which has been helping.  She has pain on her bottom due to radiation. She uses a hydrocortisone cream  which helps some. The pain is intermittent. Her appetite is moderate.     REVIEW OF SYSTEMS:   Constitutional: Denies fevers, chills or abnormal weight loss (+) fatigue Eyes: Denies blurriness of vision Ears, nose, mouth, throat, and face: Denies mucositis or sore throat Respiratory: Denies dyspnea or wheezes (+) persistent dry cough  Cardiovascular: Denies palpitation, chest discomfort or lower extremity swelling Gastrointestinal:  Denies nausea, heartburn or change in bowel habits (+) hemorrhoid pain, intermittent pain during BM from radiation.  Skin: Denies abnormal skin rashes  Lymphatics: Denies new lymphadenopathy or easy bruising Neurological:Denies numbness, tingling or new weaknesses Behavioral/Psych: Mood is stable, no new changes  All other systems were reviewed with the patient and are negative.   MEDICAL HISTORY:  Past Medical History:  Diagnosis Date  . Anemia    during childhood  . Arthritis    hands, shoulder  . Breast cancer ([place]) 03/2000   left side modified mastecomy  . Cancer [facility])    breast, age 37; left side  . Colon cancer ([place]) 02/11/2017   rectum-squamous cell carcinoma  . Depression   . Hypertension   . Squamous cell carcinoma of rectum ([place]) 02/14/2017    SURGICAL HISTORY: Past Surgical History:  Procedure Laterality Date  . ABDOMINAL HYSTERECTOMY     With bilateral oophorectomy. 2002.  Marland Kitchen BREAST BIOPSY Right 2004   benign  . BREAST SURGERY Left 2001   biopsy  . CATARACT EXTRACTION Bilateral 2017  . IR FLUORO GUIDE CV LINE RIGHT  03/11/2017  . IR FLUORO GUIDE CV LINE RIGHT  04/16/2017  . IR US GUIDE VASC ACCESS RIGHT  03/11/2017  . IR US GUIDE VASC ACCESS RIGHT  04/16/2017  . MASTECTOMY MODIFIED RADICAL Left 2001  . MENISCUS DEBRIDEMENT Right 2003  . TUMOR EXCISION Right    thigh area    I have reviewed the social history and family history with the patient and they are unchanged from previous note.  ALLERGIES:  is allergic to  ibuprofen.  MEDICATIONS:  Current Outpatient Prescriptions  Medication Sig Dispense Refill  . amLODipine (NORVASC) 5 MG tablet Take 1 tablet (5 mg total) by mouth daily. 90 tablet 3  . amphetamine-dextroamphetamine (ADDERALL) 10 MG tablet Take 1 tablet (10 mg total) by mouth 2 (two) times daily. 60 tablet 0  . atorvastatin (LIPITOR) 80 MG tablet Take 0.5 tablets (40 mg total) by mouth daily at 6 PM. 45 tablet 3  . cholecalciferol (VITAMIN D) 400 units TABS tablet Take 400 Units by mouth daily.    Marland Kitchen levofloxacin (LEVAQUIN) 500 MG tablet Take 1 tablet (500 mg total) by mouth daily. 14 tablet 0  . losartan-hydrochlorothiazide (HYZAAR) 50-12.5 MG tablet Start with one half tablet daily for 1 week, then 1 full tablet daily (Patient taking differently: Take 1 tablet by mouth daily. Start with one half tablet daily for 1 week, then 1 full tablet daily) 90 tablet 3  . pramoxine (PROCTOFOAM) 1 % foam Place 1 application rectally 3 (three) times daily as needed for anal itching or anal irritation (pain). 15 g 1  . sertraline (ZOLOFT) 50 MG tablet Take 1 tablet (50 mg total) by mouth daily. 90 tablet 3  . Hydrocodone-Homatropine 5-1.5 MG TABS Take 1 tablet by mouth every 6 (six) hours as needed. 30 tablet 0  Current Facility-Administered Medications  Medication Dose Route Frequency Provider Last Rate Last Dose  . 0.9 %  sodium chloride infusion  500 mL Intravenous Continuous [name], MD        PHYSICAL EXAMINATION: ECOG PERFORMANCE STATUS: 1 Vitals:   04/29/17 0816  BP: 131/60  Pulse: (!) 112  Resp: 17  Temp: 98.3 F (36.8 C)  TempSrc: Oral  SpO2: 100%  Weight: 133 lb 6.4 oz (60.5 kg)  Height: 5\' 2"  (1.575 m)     GENERAL:alert, no distress and comfortable SKIN: skin color, texture, turgor are normal, no rashes or significant lesions EYES: normal, Conjunctiva are pink and non-injected, sclera clear OROPHARYNX:no exudate, no erythema and lips, buccal mucosa, and tongue normal   NECK: supple, thyroid normal size, non-tender, without nodularity LYMPH:  no palpable lymphadenopathy in the cervical, axillary or inguinal LUNGS: clear to auscultation and percussion with normal breathing effort HEART: regular rate & rhythm and no murmurs and no lower extremity edema ABDOMEN:abdomen soft, non-tender and normal bowel sounds, No local megaly. Digital exam deferred today, scan showed a 1.5 cm anterior low rectum mass.  Musculoskeletal:no cyanosis of digits and no clubbing  NEURO: alert & oriented x 3 with fluent speech, no focal motor/sensory deficits   LABORATORY DATA:  I have reviewed the data as listed CBC Latest Ref Rng & Units 04/29/2017 04/27/2017 04/25/2017  WBC 3.9 - 10.3 10e3/uL 1.3(L) 0.8(LL) 0.9(LL)  Hemoglobin 11.6 - 15.9 g/dL 9.1(L) 8.8(L) 9.3(L)  Hematocrit 34.8 - 46.6 % 25.6(L) 24.4(L) 26.2(L)  Platelets 145 - 400 10e3/uL 279 268 289     CMP Latest Ref Rng & Units 04/29/2017 04/27/2017 04/25/2017  Glucose 70 - 140 mg/dl 94 88 94  BUN 7.0 - 26.0 mg/dL 14.6 15 18.2  Creatinine 0.6 - 1.1 mg/dL 0.7 0.64 0.7  Sodium 136 - 145 mEq/L 139 137 139  Potassium 3.5 - 5.1 mEq/L 3.9 3.4(L) 3.7  Chloride 101 - 111 mmol/L - 104 -  CO2 22 - 29 mEq/L 24 26 26   Calcium 8.4 - 10.4 mg/dL 8.9 8.6(L) 9.4  Total Protein 6.4 - 8.3 g/dL 6.2(L) 6.1(L) 6.4  Total Bilirubin 0.20 - 1.20 mg/dL 0.22 0.5 0.32  Alkaline Phos 40 - 150 U/L 35(L) 33(L) 38(L)  AST 5 - 34 U/L 12 15 13   ALT 0 - 55 U/L 7 10(L) <6   ANC 3.1K today   PATHOLOGY Diagnosis 02/11/17 1. Colon, biopsy, descending, polyp - TUBULAR ADENOMA (ONE FRAGMENT). - NO HIGH GRADE DYSPLASIA OR MALIGNANCY. 2. Colon, polyp(s), rectum, polyp - biopsy only - SQUAMOUS CELL CARCINOMA, BASALOID-TYPE. - SEE MICROSCOPIC DESCRIPTION. Microscopic Comment 2. There are multiple fragments of rectal mucosa which are involved by poorly differentiated squamous cell carcinoma with basaloid features.   PROCEDURES Colonoscopy  02/11/17 IMPRESSION - One 12 to 15 mm polyp in the rectum (vs proximal anus). Biopsied. Firm, - just inside anal verge or on it? - One 2 mm polyp in the descending colon, removed with a cold biopsy forceps. Resected and retrieved. - The examination was otherwise normal on direct and retroflexion views.   RADIOGRAPHIC STUDIES: I have personally reviewed the radiological images as listed and agreed with the findings in the report. No results found.   PET 02/25/17 IMPRESSION: 1. Focal accentuated activity in the anus as detailed above, with bilateral external iliac hypermetabolic adenopathy indicating metastatic disease. No hypermetabolic lesion of the liver or other findings of metastatic disease. 2. The tiny nodule adjacent to the minor fissure does not  demonstrate hypermetabolic activity but is below sensitive PET-CT size thresholds. This may warrant surveillance. 3. Other imaging findings of potential clinical significance: Aortic Atherosclerosis (ICD10-I70.0). Coronary atherosclerosis. Asymmetric right degenerative glenohumeral arthropathy. Small calcified thyroid nodules are present but not hypermetabolic.  ASSESSMENT & PLAN:  Denise Wolfe is here for a follow up. She has a past medical history of breast cancer in 2001, Anemia, Arthritis and HTN.   1. Prolonged neutropenia  -She has developed prolonged neutropenia after her first cycle chemotherapy, nadir ANC 0.2K, required multiple Granix during weekend when she was off RT, and had prolonged recovery  -recovered well now, ANC 3.1K previously -I previously spoke with radiation oncologist Dr. Lisbeth Renshaw, we'll try to keep her on radiation schedule as long as her neutropenia is not severe. I would consider prophylactic antibiotics if her Wilson dropped to below 0.5K.  -Her ANC is 0.5K today and I will give her a neulasta injection today.   2. Squamous Cell Carcinoma of Rectum, [id number] -She underwent a screening colonoscopy 02/11/2017.  Findings included a 12-15 mm polyp in the rectum, semi-sessile. A 2 mm polyp was found in the descending colon. Pathology on the rectal polyp showed squamous cell carcinoma, basaloid type. The descending colon polyp showed tubular adenoma -CT scans of the chest/abdomen/pelvis 02/19/2017 showed an abnormal right pelvic sidewall lymph node measuring 1.9 x 1.6 cm -We discussed her PET from 02/25/17. The PET shows enlarged hypermetabolic bilateral external iliac lymph nodes (2), no distant metastasis.  -Patient had a question if her pelvic and inguinal metastasis from her previous breast cancer. Were discussed this is unlikely, given the distribution of the lymph nodes which is more consistent with rectal cancer metastasis. Her previous breast cancer was diagnosed 17 years ago, hormonal sensitive, and she received a standard treatment. No evidence of recurrence since then. -Although she has locally advanced disease, this is still considered curable with concurrent chemotherapy and radiation. -I recommend a standard chemotherapy with 5-FU and mitomycin regimen, with concurrent radiation  -she tolerated the first week chemo well, he did develop severe and persistent neutropenia, and radiation was held for 3 days on week 3 -Due to her persistent neutropenia, week 4 chemotherapy has been postponed for two weeks  -She will proceed with second cycle chemo, with 5-FU alone, will omit mitomycin due to her severe and prolonged neutropenia. If her counts drop below 0.5 I will give her prophylactic antibiotics.  She completed radiation 04/25/17 and still recovering with intermittent BM irritation and lower blood counts.  -Labs reviewed and her HG is 9.1, but does not need blood transfusion yet. Overall her blood counts are still recovering from radiation.  -F/u in 2 weeks to check her blood counts -I enough her to come to clinic if she is not drinking enough or her fever spikes as her counts have not completely recovered  yet.      3. Hx of left Breast Cancer, stage 3 -Diagnosed in 2001 -Had mastectomy and ALND, adjuvant chemo therapy and radiation, took tamoxifen and aromasin -Mother diagnosed at age 45. Declines referral to genetics counselor. -We discussed that it is not likely that breast cancer has occurred in her rectum. -I previously recommend genetic testing due to her family history and personal history of breast cancer. She will think about it -She was overdue for her mammogram, I ordered a screening mammogram after her current treatment. Breast exam was normal on 03/25/2017  4. Hypertension - currently on Norvasc and Hyzaar. -BP was 164/59 last visit and  much improved to 131/60 today (04/29/17).   5. Depression - currently on Zoloft.  6. Hypercholesterolemia -on Lipitor.  7. Anemia and neutropenia -Second to chemotherapy, neutropenia precautions reviewed with patient again. -Continue lab weekly  8. Mucositis, secondary to chemotherapy -Resolved.  9. Persistent dry cough  -She developed a worsening cough and presented to ED on 04/27/17, CXR was negative, exam today unremarkable  -I discussed her continuing her antibiotics and I ordered cough syrup to take every 6 hours as needed.   PLAN:  -Order Cough syrup to take every 6 hours as needed.  -lab reviewed and ANC is 0.5K, will proceed with neulasta today  -Lab and f/u in 2 weeks     No orders of the defined types were placed in this encounter.  All questions were answered. The patient knows to call the clinic with any problems, questions or concerns. No barriers to learning was detected.  I spent 20 minutes counseling the patient face to face. The total time spent in the appointment was 25 minutes and more than 50% was on counseling and review of test results  This document serves as a record of services personally performed by Truitt Merle, MD. It was created on her behalf by Joslyn Devon, a trained medical scribe. The creation of this  record is based on the scribe's personal observations and the provider's statements to them. This document has been checked and approved by the attending provider.    Truitt Merle, MD 04/29/2017

## 2017-04-27 ENCOUNTER — Emergency Department (HOSPITAL_COMMUNITY): Payer: Medicare Other

## 2017-04-27 ENCOUNTER — Encounter (HOSPITAL_COMMUNITY): Payer: Self-pay | Admitting: Emergency Medicine

## 2017-04-27 ENCOUNTER — Other Ambulatory Visit: Payer: Self-pay

## 2017-04-27 ENCOUNTER — Emergency Department (HOSPITAL_COMMUNITY)
Admission: EM | Admit: 2017-04-27 | Discharge: 2017-04-27 | Disposition: A | Discharge status: Home / Self Care | Payer: Medicare Other | Attending: Emergency Medicine | Admitting: Emergency Medicine

## 2017-04-27 DIAGNOSIS — D701 Agranulocytosis secondary to cancer chemotherapy: Secondary | ICD-10-CM | POA: Diagnosis not present

## 2017-04-27 DIAGNOSIS — R05 Cough: Secondary | ICD-10-CM

## 2017-04-27 DIAGNOSIS — I10 Essential (primary) hypertension: Secondary | ICD-10-CM | POA: Insufficient documentation

## 2017-04-27 DIAGNOSIS — R059 Cough, unspecified: Secondary | ICD-10-CM

## 2017-04-27 DIAGNOSIS — C44520 Squamous cell carcinoma of anal skin: Secondary | ICD-10-CM | POA: Insufficient documentation

## 2017-04-27 DIAGNOSIS — Z79899 Other long term (current) drug therapy: Secondary | ICD-10-CM | POA: Diagnosis not present

## 2017-04-27 DIAGNOSIS — T451X5A Adverse effect of antineoplastic and immunosuppressive drugs, initial encounter: Secondary | ICD-10-CM | POA: Insufficient documentation

## 2017-04-27 LAB — URINALYSIS, ROUTINE W REFLEX MICROSCOPIC
BILIRUBIN URINE: NEGATIVE
Glucose, UA: NEGATIVE mg/dL
Hgb urine dipstick: NEGATIVE
KETONES UR: NEGATIVE mg/dL
Leukocytes, UA: NEGATIVE
NITRITE: NEGATIVE
PH: 6 (ref 5.0–8.0)
PROTEIN: NEGATIVE mg/dL
Specific Gravity, Urine: 1.015 (ref 1.005–1.030)

## 2017-04-27 LAB — CBC WITH DIFFERENTIAL/PLATELET
BASOS ABS: 0 10*3/uL (ref 0.0–0.1)
BASOS PCT: 2 %
EOS PCT: 21 %
Eosinophils Absolute: 0.2 10*3/uL (ref 0.0–0.7)
HEMATOCRIT: 24.4 % — AB (ref 36.0–46.0)
HEMOGLOBIN: 8.8 g/dL — AB (ref 12.0–15.0)
LYMPHS ABS: 0.2 10*3/uL — AB (ref 0.7–4.0)
LYMPHS PCT: 31 %
MCH: 33.5 pg (ref 26.0–34.0)
MCHC: 36.1 g/dL — AB (ref 30.0–36.0)
MCV: 92.8 fL (ref 78.0–100.0)
MONOS PCT: 24 %
Monocytes Absolute: 0.2 10*3/uL (ref 0.1–1.0)
NEUTROS ABS: 0.2 10*3/uL — AB (ref 1.7–7.7)
Neutrophils Relative %: 22 %
Platelets: 268 10*3/uL (ref 150–400)
RBC: 2.63 MIL/uL — ABNORMAL LOW (ref 3.87–5.11)
RDW: 15.6 % — AB (ref 11.5–15.5)
WBC: 0.8 10*3/uL — CL (ref 4.0–10.5)

## 2017-04-27 LAB — COMPREHENSIVE METABOLIC PANEL
ALK PHOS: 33 U/L — AB (ref 38–126)
ALT: 10 U/L — ABNORMAL LOW (ref 14–54)
ANION GAP: 7 (ref 5–15)
AST: 15 U/L (ref 15–41)
Albumin: 3.7 g/dL (ref 3.5–5.0)
BILIRUBIN TOTAL: 0.5 mg/dL (ref 0.3–1.2)
BUN: 15 mg/dL (ref 6–20)
CALCIUM: 8.6 mg/dL — AB (ref 8.9–10.3)
CO2: 26 mmol/L (ref 22–32)
Chloride: 104 mmol/L (ref 101–111)
Creatinine, Ser: 0.64 mg/dL (ref 0.44–1.00)
Glucose, Bld: 88 mg/dL (ref 65–99)
POTASSIUM: 3.4 mmol/L — AB (ref 3.5–5.1)
Sodium: 137 mmol/L (ref 135–145)
TOTAL PROTEIN: 6.1 g/dL — AB (ref 6.5–8.1)

## 2017-04-27 LAB — I-STAT CG4 LACTIC ACID, ED: LACTIC ACID, VENOUS: 0.74 mmol/L (ref 0.5–1.9)

## 2017-04-27 NOTE — ED Triage Notes (Signed)
Per pt, finished last chemo tx 04/20/17 and has had coughing and SOB since 04/25/17. Slightly productive with light yellow sputum. Denies pain and fever. Patient is currently neutropenic. Breath sounds slightly  diminished in lower lobes.

## 2017-04-27 NOTE — Discharge Instructions (Signed)
Work-up today showed labs overall unchanged, chest x-ray was normal.  I would continue your levaquin. Follow-up with your oncologist. Return to the ED for new or worsening symptoms.

## 2017-04-27 NOTE — ED Provider Notes (Signed)
[facility] DEPT Provider Note   CSN: [id number] Arrival date & time: 04/27/17  1101     History   Chief Complaint Chief Complaint  Patient presents with  . Cough  . Shortness of Breath    HPI Denise Wolfe is a 76 y.o. female.  The history is provided by the patient and medical records.  Cough  Associated symptoms include shortness of breath. Pertinent negatives include no chest pain.  Shortness of Breath  Associated symptoms include cough. Pertinent negatives include no fever and no chest pain.     76 year old female with history of anemia, arthritis, breast cancer status post mastectomy, squamous cell carcinoma of the rectum currently undergoing chemo/radiation, depression, hypertension, presenting to the ED for cough and mild shortness of breath. Patient last had radiation treatment 2 days ago, this was gout noted complications. States her blood work at that time did show a low neutrophil count and was subsequently started on Levaquin. she has received granix in the past.  States she started developing productive cough with white/clear sputum. She denies any fever or chills, has been checking every few hours. She reports some nausea but that is not abnormal for her. No vomiting. Has had mildly decreased appetite over the past 24 hours. She denies any chest pain.patient is followed by oncology, Dr. Burr Medico.  Past Medical History:  Diagnosis Date  . Anemia    during childhood  . Arthritis    hands, shoulder  . Breast cancer ([place]) 03/2000   left side modified mastecomy  . Cancer [facility])    breast, age 27; left side  . Colon cancer ([place]) 02/11/2017   rectum-squamous cell carcinoma  . Depression   . Hypertension   . Squamous cell carcinoma of rectum ([place]) 02/14/2017    Patient Active Problem List   Diagnosis Date Noted  . Chemotherapy induced neutropenia ([place]) 04/14/2017  . Squamous cell carcinoma of anal canal ([place]) 04/11/2017  . Squamous cell carcinoma of rectum ([place])  02/14/2017  . Attention deficit disorder 12/27/2016  . Multiple atypical nevi 12/27/2016  . Nonspecific chest pain 08/08/2016  . 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 Vitamin D insufficiency 07/19/2016  . HLD (hyperlipidemia) 07/19/2016  . Retrosternal chest pain 07/19/2016  . Degenerative joint disease (DJD) of sternoclavicular joint, right 07/19/2016  . Elevated HDL:  >er 90 07/19/2016  . History of breast Cancer 05/10/2016  . Depression 05/10/2016  . Cataract 05/10/2016  . Pain in joint, shoulder region 05/10/2016  . HTN (hypertension), benign 05/10/2016    Past Surgical History:  Procedure Laterality Date  . ABDOMINAL HYSTERECTOMY     With bilateral oophorectomy. 2002.  [facility] Kitchen BREAST BIOPSY Right 2004   benign  . BREAST SURGERY Left 2001   biopsy  . CATARACT EXTRACTION Bilateral 2017  . IR FLUORO GUIDE CV LINE RIGHT  03/11/2017  . IR FLUORO GUIDE CV LINE RIGHT  04/16/2017  . IR US GUIDE VASC ACCESS RIGHT  03/11/2017  . IR US GUIDE VASC ACCESS RIGHT  04/16/2017  . MASTECTOMY MODIFIED RADICAL Left 2001  . MENISCUS DEBRIDEMENT Right 2003  . TUMOR EXCISION Right    thigh area    OB History    No data available       Home Medications    Prior to Admission medications   Medication Sig Start Date End Date Taking? Authorizing Provider  amLODipine (NORVASC) 5 MG tablet Take 1 tablet (5 mg total) by mouth daily. 08/08/16   [name], DO  amphetamine-dextroamphetamine (ADDERALL) 10 MG tablet Take 1 tablet (10 mg total) by mouth 2 (two) times daily. 03/07/17   [name], DO  atorvastatin (LIPITOR) 80 MG tablet Take 0.5 tablets (40 mg total) by mouth daily at 6 PM. 08/08/16   Opalski, Neoma Laming, DO  cholecalciferol (VITAMIN D) 400 units TABS tablet Take 400 Units by mouth daily.    [provider]  levofloxacin (LEVAQUIN) 500 MG tablet Take 1 tablet (500 mg total) by mouth daily. 04/25/17   Truitt Merle, MD  losartan-hydrochlorothiazide [facility]) 50-12.5 MG  tablet Start with one half tablet daily for 1 week, then 1 full tablet daily Patient taking differently: Take 1 tablet by mouth daily. Start with one half tablet daily for 1 week, then 1 full tablet daily 08/08/16   [name], DO  magic mouthwash SOLN Swish and spit 5-10 mls by mouth four times daily as needed. 03/18/17   Owens Shark, NP  ondansetron (ZOFRAN) 8 MG tablet Take 1 tablet (8 mg total) by mouth every 8 (eight) hours as needed for nausea or vomiting. Patient not taking: Reported on 03/25/2017 02/22/17   Owens Shark, NP  pramoxine (PROCTOFOAM) 1 % foam Place 1 application rectally 3 (three) times daily as needed for anal itching or anal irritation (pain). 04/23/17   Hayden Pedro, PA-C  prochlorperazine (COMPAZINE) 10 MG tablet Take 1 tablet (10 mg total) by mouth every 6 (six) hours as needed for nausea or vomiting. Patient not taking: Reported on 03/25/2017 02/22/17   Owens Shark, NP  sertraline (ZOLOFT) 50 MG tablet Take 1 tablet (50 mg total) by mouth daily. 08/08/16   [name], DO    Family History Family History  Problem Relation Age of Onset  . Cancer Mother 66       breast  . Heart disease Father   . Colon cancer Neg Hx   . Esophageal cancer Neg Hx   . Rectal cancer Neg Hx   . Stomach cancer Neg Hx     Social History Social History  Substance Use Topics  . Smoking status: Never Smoker  . Smokeless tobacco: Never Used  . Alcohol use 8.4 oz/week    14 Glasses of wine per week     Allergies   Ibuprofen   Review of Systems Review of Systems  Constitutional: Negative for fever.  Respiratory: Positive for cough and shortness of breath.   Cardiovascular: Negative for chest pain and palpitations.  All other systems reviewed and are negative.    Physical Exam Updated Vital Signs BP (!) 142/68 (BP Location: Left Arm)   Pulse 100   Temp 98.2 F (36.8 C) (Oral)   SpO2 95%   Physical Exam  Constitutional: She is oriented to person,  place, and time. She appears well-developed and well-nourished.  HENT:  Head: Normocephalic and atraumatic.  Right Ear: Tympanic membrane and ear canal normal.  Left Ear: Tympanic membrane and ear canal normal.  Nose: Mucosal edema and rhinorrhea (clear) present.  Mouth/Throat: Uvula is midline, oropharynx is clear and moist and mucous membranes are normal.  Eyes: Pupils are equal, round, and reactive to light. Conjunctivae and EOM are normal.  Neck: Normal range of motion.  Cardiovascular: Normal rate, regular rhythm and normal heart sounds.   Pulmonary/Chest: Effort normal and breath sounds normal. No respiratory distress. She has no wheezes.  Lungs overall clear, no distress, able to speak in full sentences without difficulty  Abdominal: Soft. Bowel sounds are normal. There is no  tenderness. There is no rebound.  Musculoskeletal: Normal range of motion.  Neurological: She is alert and oriented to person, place, and time.  Skin: Skin is warm and dry.  Psychiatric: She has a normal mood and affect.  Nursing note and vitals reviewed.    ED Treatments / Results  Labs (all labs ordered are listed, but only abnormal results are displayed) Labs Reviewed  CBC WITH DIFFERENTIAL/PLATELET - Abnormal; Notable for the following:       Result Value   WBC 0.8 (*)    RBC 2.63 (*)    Hemoglobin 8.8 (*)    HCT 24.4 (*)    MCHC 36.1 (*)    RDW 15.6 (*)    Neutro Abs 0.2 (*)    Lymphs Abs 0.2 (*)    All other components within normal limits  COMPREHENSIVE METABOLIC PANEL - Abnormal; Notable for the following:    Potassium 3.4 (*)    Calcium 8.6 (*)    Total Protein 6.1 (*)    ALT 10 (*)    Alkaline Phosphatase 33 (*)    All other components within normal limits  CULTURE, BLOOD (ROUTINE X 2)  CULTURE, BLOOD (ROUTINE X 2)  URINE CULTURE  URINALYSIS, ROUTINE W REFLEX MICROSCOPIC  I-STAT CG4 LACTIC ACID, ED    EKG  EKG Interpretation  Date/Time:  Saturday April 27 2017 11:28:48  EDT Ventricular Rate:  96 PR Interval:    QRS Duration: 96 QT Interval:  344 QTC Calculation: 435 R Axis:   19 Text Interpretation:  Sinus rhythm Borderline low voltage, extremity leads No significant change since last tracing Confirmed by Dorie Rank [phone]) on 04/27/2017 12:06:39 PM       Radiology Dg Chest 2 View  Result Date: 04/27/2017 CLINICAL DATA:  Cough beginning 3 days ago. EXAM: CHEST  2 VIEW COMPARISON:  CT 02/19/2017 FINDINGS: Heart size is normal. Mediastinal shadows are normal. Lungs are clear. No effusions. No significant bone finding. IMPRESSION: No active cardiopulmonary disease. Electronically Signed   By: Nelson Chimes M.D.   On: 04/27/2017 11:45    Procedures Procedures (including critical care time)  Medications Ordered in ED Medications - No data to display   Initial Impression / Assessment and Plan / ED Course  I have reviewed the triage vital signs and the nursing notes.  Pertinent labs & imaging results that were available during my care of the patient were reviewed by me and considered in my medical decision making (see chart for details).  76 year old female here with cough and some very mild shortness of breath. Has history of rectal cancer, currently undergoing chemotherapy and radiation. No reported fever, has been checking frequently at home. She is afebrile and nontoxic in appearance here. Have a mild tachycardia.Most recent labs with neutropenia, currently on Levaquin as prophylaxis. Lungs overall clear on my exam. She is in no acute distress. Will plan for screening labs, blood and urine cultures, chest x-ray.  Abs overall unchanged from 2 days ago, continued neutropenia. Lactate is normal. Blood and urine cultures have been sent. UA without signs of infection. Chest x-ray is clear. Given no significant changes in her lab work and no acute findings on chest x-ray, feel patient is stable for discharge. We'll have her continue her Levaquin prophylaxis.  She was instructed to follow-up with her oncologist. I have offered her cough medications, states she would prefer to use over-the-counter. She understands to return here for new or worsening symptoms, specifically fever, chest pain, chills, sweats,  etc. Patient was discharged home in stable condition.  Patient seen and evaluated with attending physician, Dr. Tomi Bamberger, who agrees with assessment and plan of care.  Final Clinical Impressions(s) / ED Diagnoses   Final diagnoses:  Cough  Chemotherapy-induced neutropenia [facility])    New Prescriptions New Prescriptions   No medications on file     Kathryne Hitch 04/27/17 1338    Dorie Rank, MD 04/27/17 1544

## 2017-04-28 LAB — URINE CULTURE: CULTURE: NO GROWTH

## 2017-04-29 ENCOUNTER — Other Ambulatory Visit (HOSPITAL_BASED_OUTPATIENT_CLINIC_OR_DEPARTMENT_OTHER): Payer: Medicare Other

## 2017-04-29 ENCOUNTER — Ambulatory Visit (HOSPITAL_BASED_OUTPATIENT_CLINIC_OR_DEPARTMENT_OTHER): Payer: Medicare Other | Admitting: Hematology

## 2017-04-29 ENCOUNTER — Ambulatory Visit: Payer: Medicare Other

## 2017-04-29 ENCOUNTER — Telehealth: Payer: Self-pay | Admitting: Hematology

## 2017-04-29 VITALS — BP 131/60 | HR 112 | Temp 98.3°F | Resp 17 | Ht 62.0 in | Wt 133.4 lb

## 2017-04-29 DIAGNOSIS — C801 Malignant (primary) neoplasm, unspecified: Secondary | ICD-10-CM

## 2017-04-29 DIAGNOSIS — C20 Malignant neoplasm of rectum: Secondary | ICD-10-CM

## 2017-04-29 DIAGNOSIS — D6481 Anemia due to antineoplastic chemotherapy: Secondary | ICD-10-CM

## 2017-04-29 DIAGNOSIS — Z853 Personal history of malignant neoplasm of breast: Secondary | ICD-10-CM

## 2017-04-29 DIAGNOSIS — I10 Essential (primary) hypertension: Secondary | ICD-10-CM

## 2017-04-29 DIAGNOSIS — D701 Agranulocytosis secondary to cancer chemotherapy: Secondary | ICD-10-CM | POA: Diagnosis not present

## 2017-04-29 DIAGNOSIS — T451X5A Adverse effect of antineoplastic and immunosuppressive drugs, initial encounter: Secondary | ICD-10-CM

## 2017-04-29 LAB — COMPREHENSIVE METABOLIC PANEL
ALBUMIN: 3.5 g/dL (ref 3.5–5.0)
ALK PHOS: 35 U/L — AB (ref 40–150)
ALT: 7 U/L (ref 0–55)
ANION GAP: 9 meq/L (ref 3–11)
AST: 12 U/L (ref 5–34)
BUN: 14.6 mg/dL (ref 7.0–26.0)
CALCIUM: 8.9 mg/dL (ref 8.4–10.4)
CHLORIDE: 107 meq/L (ref 98–109)
CO2: 24 mEq/L (ref 22–29)
Creatinine: 0.7 mg/dL (ref 0.6–1.1)
EGFR: 84 mL/min/{1.73_m2} — AB (ref >90)
Glucose: 94 mg/dl (ref 70–140)
POTASSIUM: 3.9 meq/L (ref 3.5–5.1)
Sodium: 139 mEq/L (ref 136–145)
Total Bilirubin: 0.22 mg/dL (ref 0.20–1.20)
Total Protein: 6.2 g/dL — ABNORMAL LOW (ref 6.4–8.3)

## 2017-04-29 LAB — CBC WITH DIFFERENTIAL/PLATELET
BASO%: 1.3 % (ref 0.0–2.0)
BASOS ABS: 0 10*3/uL (ref 0.0–0.1)
EOS%: 13.2 % — AB (ref 0.0–7.0)
Eosinophils Absolute: 0.2 10*3/uL (ref 0.0–0.5)
HEMATOCRIT: 25.6 % — AB (ref 34.8–46.6)
HEMOGLOBIN: 9.1 g/dL — AB (ref 11.6–15.9)
LYMPH%: 10.6 % — ABNORMAL LOW (ref 14.0–49.7)
MCH: 34.1 pg — AB (ref 25.1–34.0)
MCHC: 35.5 g/dL (ref 31.5–36.0)
MCV: 96 fL (ref 79.5–101.0)
MONO#: 0.4 10*3/uL (ref 0.1–0.9)
MONO%: 34.6 % — ABNORMAL HIGH (ref 0.0–14.0)
NEUT#: 0.5 10*3/uL — CL (ref 1.5–6.5)
NEUT%: 40.3 % (ref 38.4–76.8)
PLATELETS: 279 10*3/uL (ref 145–400)
RBC: 2.66 10*6/uL — ABNORMAL LOW (ref 3.70–5.45)
RDW: 17.7 % — ABNORMAL HIGH (ref 11.2–14.5)
WBC: 1.3 10*3/uL — ABNORMAL LOW (ref 3.9–10.3)
lymph#: 0.1 10*3/uL — ABNORMAL LOW (ref 0.9–3.3)

## 2017-04-29 MED ORDER — PEGFILGRASTIM INJECTION 6 MG/0.6ML ~~LOC~~
6.0000 mg | PREFILLED_SYRINGE | Freq: Once | SUBCUTANEOUS | Status: AC
  Administered 2017-04-29: 6 mg via SUBCUTANEOUS
  Filled 2017-04-29: qty 0.6

## 2017-04-29 MED ORDER — HYDROCODONE-HOMATROPINE 5-1.5 MG PO TABS: 1.0000 | ORAL_TABLET | Freq: Four times a day (QID) | ORAL | 0 refills | Status: DC | PRN

## 2017-04-29 NOTE — Telephone Encounter (Signed)
Gave patient avs report and appointments for October  °

## 2017-04-29 NOTE — Progress Notes (Signed)
Injection given by desk nurse 

## 2017-04-30 ENCOUNTER — Encounter: Payer: Self-pay | Admitting: Hematology

## 2017-05-02 LAB — CULTURE, BLOOD (ROUTINE X 2)
Culture: NO GROWTH
Culture: NO GROWTH
SPECIAL REQUESTS: ADEQUATE
Special Requests: ADEQUATE

## 2017-05-03 ENCOUNTER — Telehealth: Payer: Self-pay

## 2017-05-03 ENCOUNTER — Ambulatory Visit (HOSPITAL_BASED_OUTPATIENT_CLINIC_OR_DEPARTMENT_OTHER): Payer: Medicare Other | Admitting: Medical

## 2017-05-03 ENCOUNTER — Ambulatory Visit (HOSPITAL_COMMUNITY)
Admission: RE | Admit: 2017-05-03 | Discharge: 2017-05-03 | Disposition: A | Discharge status: Home / Self Care | Payer: Medicare Other | Source: Ambulatory Visit | Attending: Medical | Admitting: Medical

## 2017-05-03 ENCOUNTER — Other Ambulatory Visit (HOSPITAL_BASED_OUTPATIENT_CLINIC_OR_DEPARTMENT_OTHER): Payer: Medicare Other

## 2017-05-03 ENCOUNTER — Telehealth: Payer: Self-pay | Admitting: Family Medicine

## 2017-05-03 VITALS — BP 128/58 | HR 90 | Temp 98.2°F | Resp 16 | Ht 62.0 in | Wt 131.7 lb

## 2017-05-03 DIAGNOSIS — E860 Dehydration: Secondary | ICD-10-CM

## 2017-05-03 DIAGNOSIS — R300 Dysuria: Secondary | ICD-10-CM

## 2017-05-03 DIAGNOSIS — C20 Malignant neoplasm of rectum: Secondary | ICD-10-CM | POA: Diagnosis not present

## 2017-05-03 DIAGNOSIS — R059 Cough, unspecified: Secondary | ICD-10-CM

## 2017-05-03 DIAGNOSIS — R0602 Shortness of breath: Secondary | ICD-10-CM

## 2017-05-03 DIAGNOSIS — C778 Secondary and unspecified malignant neoplasm of lymph nodes of multiple regions: Secondary | ICD-10-CM | POA: Diagnosis not present

## 2017-05-03 DIAGNOSIS — R05 Cough: Secondary | ICD-10-CM | POA: Insufficient documentation

## 2017-05-03 DIAGNOSIS — R630 Anorexia: Secondary | ICD-10-CM | POA: Diagnosis not present

## 2017-05-03 DIAGNOSIS — R918 Other nonspecific abnormal finding of lung field: Secondary | ICD-10-CM | POA: Insufficient documentation

## 2017-05-03 DIAGNOSIS — R531 Weakness: Secondary | ICD-10-CM

## 2017-05-03 DIAGNOSIS — R194 Change in bowel habit: Secondary | ICD-10-CM

## 2017-05-03 DIAGNOSIS — J984 Other disorders of lung: Secondary | ICD-10-CM | POA: Diagnosis not present

## 2017-05-03 DIAGNOSIS — R509 Fever, unspecified: Secondary | ICD-10-CM

## 2017-05-03 DIAGNOSIS — J181 Lobar pneumonia, unspecified organism: Secondary | ICD-10-CM

## 2017-05-03 DIAGNOSIS — J189 Pneumonia, unspecified organism: Secondary | ICD-10-CM

## 2017-05-03 LAB — COMPREHENSIVE METABOLIC PANEL
ALBUMIN: 3.5 g/dL (ref 3.5–5.0)
ALK PHOS: 138 U/L (ref 40–150)
ALT: 11 U/L (ref 0–55)
AST: 24 U/L (ref 5–34)
Anion Gap: 10 mEq/L (ref 3–11)
BUN: 14.2 mg/dL (ref 7.0–26.0)
CALCIUM: 9.3 mg/dL (ref 8.4–10.4)
CO2: 27 mEq/L (ref 22–29)
Chloride: 102 mEq/L (ref 98–109)
Creatinine: 0.8 mg/dL (ref 0.6–1.1)
EGFR: 70 mL/min/{1.73_m2} — ABNORMAL LOW (ref >90)
GLUCOSE: 98 mg/dL (ref 70–140)
POTASSIUM: 4 meq/L (ref 3.5–5.1)
SODIUM: 139 meq/L (ref 136–145)
Total Bilirubin: 0.35 mg/dL (ref 0.20–1.20)
Total Protein: 6.3 g/dL — ABNORMAL LOW (ref 6.4–8.3)

## 2017-05-03 LAB — CBC WITH DIFFERENTIAL/PLATELET
HEMATOCRIT: 27.1 % — AB (ref 34.8–46.6)
HEMOGLOBIN: 9.4 g/dL — AB (ref 11.6–15.9)
MCH: 33.1 pg (ref 25.1–34.0)
MCHC: 34.7 g/dL (ref 31.5–36.0)
MCV: 95.4 fL (ref 79.5–101.0)
PLATELETS: 162 10*3/uL (ref 145–400)
RBC: 2.84 10*6/uL — ABNORMAL LOW (ref 3.70–5.45)
RDW: 16.8 % — ABNORMAL HIGH (ref 11.2–14.5)
WBC: 35.2 10*3/uL — AB (ref 3.9–10.3)

## 2017-05-03 LAB — MANUAL DIFFERENTIAL
ALC: 2.1 10*3/uL (ref 0.9–3.3)
ANC (CHCC MAN DIFF): 25.3 10*3/uL — AB (ref 1.5–6.5)
EOS: 3 % (ref 0–7)
LYMPH: 6 % — ABNORMAL LOW (ref 14–49)
MONO: 19 % — AB (ref 0–14)
Myelocytes: 1 % — ABNORMAL HIGH (ref 0–0)
PLT EST: ADEQUATE
SEG: 71 % (ref 38–77)

## 2017-05-03 LAB — URINALYSIS, MICROSCOPIC - CHCC
BLOOD: NEGATIVE
Bilirubin (Urine): NEGATIVE
GLUCOSE UR CHCC: NEGATIVE mg/dL
Ketones: 5 mg/dL
NITRITE: NEGATIVE
PH: 6 (ref 4.6–8.0)
PROTEIN: NEGATIVE mg/dL
Specific Gravity, Urine: 1.005 (ref 1.003–1.035)
UROBILINOGEN UR: 0.2 mg/dL (ref 0.2–1)

## 2017-05-03 MED ORDER — DOXYCYCLINE HYCLATE 100 MG PO TABS: 100.0000 mg | ORAL_TABLET | Freq: Two times a day (BID) | ORAL | 0 refills | Status: DC

## 2017-05-03 MED ORDER — SODIUM CHLORIDE 0.9 % IV SOLN
INTRAVENOUS | Status: AC
  Administered 2017-05-03: 14:00:00 via INTRAVENOUS

## 2017-05-03 NOTE — Patient Instructions (Signed)
Dehydration, Adult Dehydration is when there is not enough fluid or water in your body. This happens when you lose more fluids than you take in. Dehydration can range from mild to very bad. It should be treated right away to keep it from getting very bad. Symptoms of mild dehydration may include:  Thirst.  Dry lips.  Slightly dry mouth.  Dry, warm skin.  Dizziness. Symptoms of moderate dehydration may include:  Very dry mouth.  Muscle cramps.  Dark pee (urine). Pee may be the color of tea.  Your body making less pee.  Your eyes making fewer tears.  Heartbeat that is uneven or faster than normal (palpitations).  Headache.  Light-headedness, especially when you stand up from sitting.  Fainting (syncope). Symptoms of very bad dehydration may include:  Changes in skin, such as: ? Cold and clammy skin. ? Blotchy (mottled) or pale skin. ? Skin that does not quickly return to normal after being lightly pinched and let go (poor skin turgor).  Changes in body fluids, such as: ? Feeling very thirsty. ? Your eyes making fewer tears. ? Not sweating when body temperature is high, such as in hot weather. ? Your body making very little pee.  Changes in vital signs, such as: ? Weak pulse. ? Pulse that is more than 100 beats a minute when you are sitting still. ? Fast breathing. ? Low blood pressure.  Other changes, such as: ? Sunken eyes. ? Cold hands and feet. ? Confusion. ? Lack of energy (lethargy). ? Trouble waking up from sleep. ? Short-term weight loss. ? Unconsciousness. Follow these instructions at home:  If told by your doctor, drink an ORS: ? Make an ORS by using instructions on the package. ? Start by drinking small amounts, about  cup (120 mL) every 5-10 minutes. ? Slowly drink more until you have had the amount that your doctor said to have.  Drink enough clear fluid to keep your pee clear or pale yellow. If you were told to drink an ORS, finish the ORS  first, then start slowly drinking clear fluids. Drink fluids such as: ? Water. Do not drink only water by itself. Doing that can make the salt (sodium) level in your body get too low (hyponatremia). ? Ice chips. ? Fruit juice that you have added water to (diluted). ? Low-calorie sports drinks.  Avoid: ? Alcohol. ? Drinks that have a lot of sugar. These include high-calorie sports drinks, fruit juice that does not have water added, and soda. ? Caffeine. ? Foods that are greasy or have a lot of fat or sugar.  Take over-the-counter and prescription medicines only as told by your doctor.  Do not take salt tablets. Doing that can make the salt level in your body get too high (hypernatremia).  Eat foods that have minerals (electrolytes). Examples include bananas, oranges, potatoes, tomatoes, and spinach.  Keep all follow-up visits as told by your doctor. This is important. Contact a doctor if:  You have belly (abdominal) pain that: ? Gets worse. ? Stays in one area (localizes).  You have a rash.  You have a stiff neck.  You get angry or annoyed more easily than normal (irritability).  You are more sleepy than normal.  You have a harder time waking up than normal.  You feel: ? Weak. ? Dizzy. ? Very thirsty.  You have peed (urinated) only a small amount of very dark pee during 6-8 hours. Get help right away if:  You have symptoms of   very bad dehydration.  You cannot drink fluids without throwing up (vomiting).  Your symptoms get worse with treatment.  You have a fever.  You have a very bad headache.  You are throwing up or having watery poop (diarrhea) and it: ? Gets worse. ? Does not go away.  You have blood or something green (bile) in your throw-up.  You have blood in your poop (stool). This may cause poop to look black and tarry.  You have not peed in 6-8 hours.  You pass out (faint).  Your heart rate when you are sitting still is more than 100 beats a  minute.  You have trouble breathing. This information is not intended to replace advice given to you by your health care provider. Make sure you discuss any questions you have with your health care provider. Document Released: 05/26/2009 Document Revised: 02/17/2016 Document Reviewed: 09/23/2015 Elsevier Interactive Patient Education  2018 [employer]  

## 2017-05-03 NOTE — Telephone Encounter (Signed)
Patient's daughter called on behalf of patient and has some concerns she wants to talk to someone clinical about.

## 2017-05-03 NOTE — Progress Notes (Signed)
[facility] Progress Note   Denise Wolfe [id number] 02/28/1941 76 y.o.  Denise Wolfe is managed by Dr. Truitt Merle  Actively treated with chemotherapy: no  Current Therapy: completed concurrent chemotherapy and radiation on 04/25/2017  Assessment/Plan:   Dysuria - Plan: UA with Microscopic  Cough - Plan: DG Chest 2 View  Dehydration - Plan: 0.9 %  sodium chloride infusion  Pneumonia of left lower lobe due to infectious organism (HCC) - Plan: doxycycline (VIBRA-TABS) 100 MG tablet  Dysuria colon: A urinalysis was collected today.  Cough with pneumonia:. The patient was referred for a chest x-ray which showed an area of atelectasis versus pneumonia in the left lung base. The patient was given a prescription for doxycycline 100 mg by mouth twice a day 7 days and was instructed to discontinue Levaquin. Please see After Visit Summary for patient specific instructions. CBC and CMET were collected today. Results are as noted below. The patient is status post a Neulasta injection on 04/29/2017.  Dehydration colon. The patient was given 1 L of normal saline IV today.  Future Appointments Date Time Provider [place]  05/14/2017 8:30 AM [facility] 1 [facility] None  05/14/2017 9:00 AM Truitt Merle, MD [facility] None  05/23/2017 1:30 PM Hayden Pedro, PA-C [facility] None    Orders Placed This Encounter  Procedures  . DG Chest 2 View  . UA with Microscopic       Subjective:   Patient ID:  Denise Wolfe is a 76 y.o. (DOB Feb 05, 1941) female.  Chief Complaint: No chief complaint on file.   HPI [name] is a 76 year old female with a diagnosis of a squamous cell carcinoma of the rectum.  The patient's oncologic history dates to 02/11/2017 when she had a colonoscopy. Biopsy of a polyp in the rectum returned showing a squamous cell carcinoma, basaloid-type. The patient was ultimately staged as a TX, N2, M0. Decision was made to treat the  patient with concurrent chemotherapy and radiation with mitomycin C on day 1 and 29 and 5-fluorouracil on day 1-5 and 29-33. Mitomycin was dropped starting with cycle 2. The patient was last treated with 5-fluorouracil from 9/04 through 9/08. The patient completed concurrent chemotherapy and radiation on 04/25/2017. She was dosed with Granix subcutaneous on 04/13/2017 and Neulasta on 04/29/2017. She was given a prescription for Levaquin once daily for 14 days on 04/25/2017 secondary to her neutropenia. She was not feeling any better and was seen in the emergency room on Saturday, 04/27/2017 with a chest x-ray completed which showed no active cardiopulmonary disease.   She presents to the office today having had a 3-4 day history of low-grade fevers to 99.5, anorexia, insomnia, positional dizziness, weakness, frontal sinus pressure and pain, postnasal drainage, a clear nasal discharge, shortness of breath which increases with activity and a nonproductive cough. She is having ongoing dysuria but believes that this could be related to her radiation therapy.  She continues to have loose painful stools with no bright red blood per rectum or melena.  Medications: I have reviewed the patient's current medications.  Allergies:  Allergies  Allergen Reactions  . Ibuprofen Rash    Past Medical History:  Diagnosis Date  . Anemia    during childhood  . Arthritis    hands, shoulder  . Breast cancer ([place]) 03/2000   left side modified mastecomy  . Cancer [facility])    breast, age 78; left side  . Colon cancer ([place]) 02/11/2017   rectum-squamous cell carcinoma  .  Depression   . Hypertension   . Squamous cell carcinoma of rectum ([place]) 02/14/2017    Past Surgical History:  Procedure Laterality Date  . ABDOMINAL HYSTERECTOMY     With bilateral oophorectomy. 2002.  Marland Kitchen BREAST BIOPSY Right 2004   benign  . BREAST SURGERY Left 2001   biopsy  . CATARACT EXTRACTION Bilateral 2017  . IR FLUORO GUIDE CV LINE RIGHT   03/11/2017  . IR FLUORO GUIDE CV LINE RIGHT  04/16/2017  . IR US GUIDE VASC ACCESS RIGHT  03/11/2017  . IR US GUIDE VASC ACCESS RIGHT  04/16/2017  . MASTECTOMY MODIFIED RADICAL Left 2001  . MENISCUS DEBRIDEMENT Right 2003  . TUMOR EXCISION Right    thigh area    Family History  Problem Relation Age of Onset  . Cancer Mother 14       breast  . Heart disease Father   . Colon cancer Neg Hx   . Esophageal cancer Neg Hx   . Rectal cancer Neg Hx   . Stomach cancer Neg Hx     Social History   Social History  . Marital status: Widowed    Spouse name: N/A  . Number of children: N/A  . Years of education: N/A   Occupational History  . Not on file.   Social History Main Topics  . Smoking status: Never Smoker  . Smokeless tobacco: Never Used  . Alcohol use 8.4 oz/week    14 Glasses of wine per week  . Drug use: No  . Sexual activity: Not Currently   Other Topics Concern  . Not on file   Social History Narrative  . No narrative on file    Past Medical History, Surgical history, Social history, and Family history were reviewed and updated as appropriate.   Please see review of systems for further details on the patient's review from today.   Review of Systems:  Review of Systems  Constitutional: Positive for appetite change and unexpected weight change. Negative for chills, diaphoresis and fever.  HENT: Positive for postnasal drip, rhinorrhea, sinus pain and sinus pressure.   Respiratory: Positive for cough and shortness of breath. Negative for chest tightness.   Cardiovascular: Negative for chest pain, palpitations and leg swelling.  Gastrointestinal: Positive for rectal pain. Negative for blood in stool, constipation, nausea and vomiting.       Loose stools  Genitourinary: Positive for dysuria. Negative for difficulty urinating.  Neurological: Positive for dizziness.  Psychiatric/Behavioral: Positive for sleep disturbance.    Objective:   Physical Exam:  BP (!) 115/55  (BP Location: Left Arm, Patient Position: Sitting)   Pulse (!) 110   Temp 98.2 F (36.8 C) (Oral)   Resp 16   Ht _0  (1.575 m)   Wt 131 lb 11.2 oz (59.7 kg)   SpO2 93%   BMI 24.09 kg/m  ECOG: 1  Physical Exam  Constitutional: No distress.  HENT:  Head: Normocephalic and atraumatic.  Mouth/Throat: Oropharynx is clear and moist. No oropharyngeal exudate.  Eyes: Right eye exhibits no discharge. Left eye exhibits no discharge. No scleral icterus.  Neck: Normal range of motion. Neck supple.  Cardiovascular: Normal rate, regular rhythm and normal heart sounds.  Exam reveals no gallop and no friction rub.   No murmur heard. Pulmonary/Chest: Effort normal. No respiratory distress. She has no wheezes.      An access port is noted in the right chest wall. No erythema or increased warmth.  Abdominal: Soft. Bowel sounds are  normal. She exhibits no distension. There is no tenderness. There is no rebound and no guarding.  Musculoskeletal: She exhibits no edema.  Lymphadenopathy:    She has no cervical adenopathy.  Neurological: She is alert. Coordination normal.  Skin: Skin is warm and dry. No rash noted. She is not diaphoretic. No erythema.  Psychiatric: She has a normal mood and affect. Her behavior is normal. Judgment and thought content normal.    Lab Review:     Component Value Date/Time   NA 139 05/03/2017 1053   K 4.0 05/03/2017 1053   CL 104 04/27/2017 1148   CO2 27 05/03/2017 1053   GLUCOSE 98 05/03/2017 1053   BUN 14.2 05/03/2017 1053   CREATININE 0.8 05/03/2017 1053   CALCIUM 9.3 05/03/2017 1053   PROT 6.3 (L) 05/03/2017 1053   ALBUMIN 3.5 05/03/2017 1053   AST 24 05/03/2017 1053   ALT 11 05/03/2017 1053   ALKPHOS 138 05/03/2017 1053   BILITOT 0.35 05/03/2017 1053   GFRNONAA >60 04/27/2017 1148   GFRNONAA 78 07/11/2016 0923   GFRAA >60 04/27/2017 1148   GFRAA >89 07/11/2016 0923       Component Value Date/Time   WBC 35.2 (H) 05/03/2017 1053   WBC 0.8 (LL)  04/27/2017 1148   RBC 2.84 (L) 05/03/2017 1053   RBC 2.63 (L) 04/27/2017 1148   HGB 9.4 (L) 05/03/2017 1053   HCT 27.1 (L) 05/03/2017 1053   PLT 162 05/03/2017 1053   MCV 95.4 05/03/2017 1053   MCH 33.1 05/03/2017 1053   MCH 33.5 04/27/2017 1148   MCHC 34.7 05/03/2017 1053   MCHC 36.1 (H) 04/27/2017 1148   RDW 16.8 (H) 05/03/2017 1053   LYMPHSABS 0.1 (L) 04/29/2017 0800   MONOABS 0.4 04/29/2017 0800   EOSABS 0.2 04/29/2017 0800   BASOSABS 0.0 04/29/2017 0800   -------------------------------  Imaging from last 24 hours (if applicable):  Radiology interpretation: Dg Chest 2 View  Result Date: 05/03/2017 CLINICAL DATA:  Cough over the last week. Shortness of breath and chest tightness. EXAM: CHEST  2 VIEW COMPARISON:  04/27/2017.  02/19/2017. FINDINGS: Heart size is normal. Ordinary aortic atherosclerosis is noted. The pulmonary vascularity is normal. There is mild atelectasis and or infiltrate in the left lower lobe. No effusions. The remainder the chest is clear. No acute bone finding. IMPRESSION: Density at the left base consistent with atelectasis and/or pneumonia. Electronically Signed   By: Nelson Chimes M.D.   On: 05/03/2017 13:12   Dg Chest 2 View  Result Date: 04/27/2017 CLINICAL DATA:  Cough beginning 3 days ago. EXAM: CHEST  2 VIEW COMPARISON:  CT 02/19/2017 FINDINGS: Heart size is normal. Mediastinal shadows are normal. Lungs are clear. No effusions. No significant bone finding. IMPRESSION: No active cardiopulmonary disease. Electronically Signed   By: Nelson Chimes M.D.   On: 04/27/2017 11:45   Ir Fluoro Guide Cv Line Right  Result Date: 04/16/2017 INDICATION: History of rectal cancer. In need of intravenous access for chemotherapy administration. EXAM: ULTRASOUND AND FLUOROSCOPIC GUIDED PICC LINE INSERTION MEDICATIONS: None. CONTRAST:  None FLUOROSCOPY TIME:  6 seconds (1 mGy) COMPLICATIONS: None immediate. TECHNIQUE: The procedure, risks, benefits, and alternatives were  explained to the patient and informed written consent was obtained. A timeout was performed prior to the initiation of the procedure. The right upper extremity was prepped with chlorhexidine in a sterile fashion, and a sterile drape was applied covering the operative field. Maximum barrier sterile technique with sterile gowns and gloves were used  for the procedure. A timeout was performed prior to the initiation of the procedure. Local anesthesia was provided with 1% lidocaine. Under direct ultrasound guidance, the basilic vein was accessed with a micropuncture kit after the overlying soft tissues were anesthetized with 1% lidocaine. After the overlying soft tissues were anesthetized, a small venotomy incision was created and a micropuncture kit was utilized to access the right basilic vein. Real-time ultrasound guidance was utilized for vascular access including the acquisition of a permanent ultrasound image documenting patency of the accessed vessel. A guidewire was advanced to the level of the superior caval-atrial junction for measurement purposes and the PICC line was cut to length. A peel-away sheath was placed and a 31 cm, 5 [place], single lumen was inserted to level of the superior caval-atrial junction. A post procedure spot fluoroscopic was obtained. The catheter easily aspirated and flushed and was sutured in place. A dressing was placed. The patient tolerated the procedure well without immediate post procedural complication. FINDINGS: After catheter placement, the tip lies within the superior cavoatrial junction. The catheter aspirates and flushes normally and is ready for immediate use. IMPRESSION: Successful ultrasound and fluoroscopic guided placement of a right basilic vein approach, 31 cm, 5 French, single lumen PICC with tip at the superior caval-atrial junction. The PICC line is ready for immediate use. Electronically Signed   By: Sandi Mariscal M.D.   On: 04/16/2017 09:05   Ir US Guide Vasc  Access Right  Result Date: 04/16/2017 INDICATION: History of rectal cancer. In need of intravenous access for chemotherapy administration. EXAM: ULTRASOUND AND FLUOROSCOPIC GUIDED PICC LINE INSERTION MEDICATIONS: None. CONTRAST:  None FLUOROSCOPY TIME:  6 seconds (1 mGy) COMPLICATIONS: None immediate. TECHNIQUE: The procedure, risks, benefits, and alternatives were explained to the patient and informed written consent was obtained. A timeout was performed prior to the initiation of the procedure. The right upper extremity was prepped with chlorhexidine in a sterile fashion, and a sterile drape was applied covering the operative field. Maximum barrier sterile technique with sterile gowns and gloves were used for the procedure. A timeout was performed prior to the initiation of the procedure. Local anesthesia was provided with 1% lidocaine. Under direct ultrasound guidance, the basilic vein was accessed with a micropuncture kit after the overlying soft tissues were anesthetized with 1% lidocaine. After the overlying soft tissues were anesthetized, a small venotomy incision was created and a micropuncture kit was utilized to access the right basilic vein. Real-time ultrasound guidance was utilized for vascular access including the acquisition of a permanent ultrasound image documenting patency of the accessed vessel. A guidewire was advanced to the level of the superior caval-atrial junction for measurement purposes and the PICC line was cut to length. A peel-away sheath was placed and a 31 cm, 5 [place], single lumen was inserted to level of the superior caval-atrial junction. A post procedure spot fluoroscopic was obtained. The catheter easily aspirated and flushed and was sutured in place. A dressing was placed. The patient tolerated the procedure well without immediate post procedural complication. FINDINGS: After catheter placement, the tip lies within the superior cavoatrial junction. The catheter aspirates and  flushes normally and is ready for immediate use. IMPRESSION: Successful ultrasound and fluoroscopic guided placement of a right basilic vein approach, 31 cm, 5 French, single lumen PICC with tip at the superior caval-atrial junction. The PICC line is ready for immediate use. Electronically Signed   By: Sandi Mariscal M.D.   On: 04/16/2017 09:05  This patient was seen with Dr. Burr Medico with my treatment plan reviewed with her. She expressed agreement with my medical management of this patient.  I have seen the patient, examined her. I agree with the assessment and and plan and have edited the notes.   She is recovering slowly from the chemotherapy and irradiation. Neutropenia resolved with Neulasta. I reviewed her chest x-ray, left lower lobe pneumonia is not ruled out, giving her clinical symptoms, slightly worsening cough, exam showed a bilateral rhonchi,  I'll change her antibiotics from Levaquin to doxycycline. She knows to call us if not getting better.  Truitt Merle  05/03/2017

## 2017-05-03 NOTE — Telephone Encounter (Signed)
Daughter called pt with low grade fever 99.5 for 3-4 days. Could not eat last night, when stands up is dizzy, is weak and can only stand a minute or 2,  cough is still present, won't go to ER, PCP not time to see her today.   Non productive cough, frequent strong cough, "takes her breath away strong". Hydrocodone-homatropine tabs not helping cough.  S/w Dr Burr Medico and set up labs and [facility]. Daughter stated could get here no earlier than 10. Sent inbasket for close to that time.   Had 4 day 51fu pump 9-4 to 9-8 Next OV 10/2

## 2017-05-03 NOTE — Telephone Encounter (Signed)
Called left message to call the office back. MPulliam, CMA/RT(R)  

## 2017-05-06 ENCOUNTER — Encounter: Payer: Self-pay | Admitting: Medical

## 2017-05-08 NOTE — Progress Notes (Signed)
  Radiation Oncology         [phone] ________________________________  Name: Denise Wolfe MRN: [id number]  Date: 04/25/2017  DOB: [birth year]  End of Treatment Note  Diagnosis:   Squamous Cell Carcinoma of the proximal anus.       ICD-10-CM   1. Cancer of anal canal ([place]) C21.1     Indication for treatment:  curative       Radiation treatment dates:   03/11/2017 to 04/25/2017  Site/dose:   The anal/pelvis was treated to 54 Gy in 30 fractions of 1.8 Gy.  Beams/energy:   IMRT // 6X  Narrative: The patient tolerated radiation treatment relatively well.   Reports severe fatigue. Denies any broken skin to anus. Reports having a bowel movement usually three to four times a day. Denies pain. Appetite is good.   Plan: The patient has completed radiation treatment. The patient will return to radiation oncology clinic for routine followup in one month. I advised them to call or return sooner if they have any questions or concerns related to their recovery or treatment.  ------------------------------------------------  Jodelle Gross, MD, PhD  This document serves as a record of services personally performed by Kyung Rudd, MD. It was created on his behalf by Arlyce Harman, a trained medical scribe. The creation of this record is based on the scribe's personal observations and the provider's statements to them. This document has been checked and approved by the attending provider.

## 2017-05-08 NOTE — Telephone Encounter (Signed)
Patient is coming in for appointment tomorrow 05/09/2017.  MPulliam, CMA/RT(R)

## 2017-05-09 ENCOUNTER — Encounter: Payer: Self-pay | Admitting: Family Medicine

## 2017-05-09 ENCOUNTER — Ambulatory Visit (INDEPENDENT_AMBULATORY_CARE_PROVIDER_SITE_OTHER): Payer: Medicare Other | Admitting: Family Medicine

## 2017-05-09 VITALS — BP 116/68 | HR 106 | Ht 62.0 in | Wt 130.5 lb

## 2017-05-09 DIAGNOSIS — C211 Malignant neoplasm of anal canal: Secondary | ICD-10-CM

## 2017-05-09 DIAGNOSIS — T451X5A Adverse effect of antineoplastic and immunosuppressive drugs, initial encounter: Secondary | ICD-10-CM

## 2017-05-09 DIAGNOSIS — J181 Lobar pneumonia, unspecified organism: Secondary | ICD-10-CM

## 2017-05-09 DIAGNOSIS — F32A Depression, unspecified: Secondary | ICD-10-CM

## 2017-05-09 DIAGNOSIS — R5383 Other fatigue: Secondary | ICD-10-CM

## 2017-05-09 DIAGNOSIS — Z923 Personal history of irradiation: Secondary | ICD-10-CM

## 2017-05-09 DIAGNOSIS — R062 Wheezing: Secondary | ICD-10-CM

## 2017-05-09 DIAGNOSIS — R208 Other disturbances of skin sensation: Secondary | ICD-10-CM

## 2017-05-09 DIAGNOSIS — C20 Malignant neoplasm of rectum: Secondary | ICD-10-CM

## 2017-05-09 DIAGNOSIS — J189 Pneumonia, unspecified organism: Secondary | ICD-10-CM

## 2017-05-09 DIAGNOSIS — R059 Cough, unspecified: Secondary | ICD-10-CM

## 2017-05-09 DIAGNOSIS — R05 Cough: Secondary | ICD-10-CM

## 2017-05-09 DIAGNOSIS — F329 Major depressive disorder, single episode, unspecified: Secondary | ICD-10-CM

## 2017-05-09 DIAGNOSIS — C801 Malignant (primary) neoplasm, unspecified: Secondary | ICD-10-CM

## 2017-05-09 DIAGNOSIS — Z9221 Personal history of antineoplastic chemotherapy: Secondary | ICD-10-CM

## 2017-05-09 DIAGNOSIS — G479 Sleep disorder, unspecified: Secondary | ICD-10-CM

## 2017-05-09 DIAGNOSIS — D701 Agranulocytosis secondary to cancer chemotherapy: Secondary | ICD-10-CM

## 2017-05-09 MED ORDER — AMITRIPTYLINE HCL 25 MG PO TABS: 25.0000 mg | ORAL_TABLET | Freq: Every day | ORAL | 1 refills | Status: DC

## 2017-05-09 MED ORDER — ALBUTEROL SULFATE HFA 108 (90 BASE) MCG/ACT IN AERS: 1.0000 | INHALATION_SPRAY | RESPIRATORY_TRACT | 11 refills | Status: DC | PRN

## 2017-05-09 NOTE — Assessment & Plan Note (Addendum)
Amitriptyline after risk benefits discussed with patient.  - Likely side effect of chemotherapy/  radiation.  - She will let me know if she develops any rash or flaky skin, and which case we can try ketoconazole shampoo.

## 2017-05-09 NOTE — Assessment & Plan Note (Signed)
Amitriptyline prescription given.  - We discussed briefly we might want to increase her Zoloft dose since the increased stress of chemoradiation and all she's been through, however, addition of Elavil will increase effects of Zoloft.    We'll continue to monitor  Encouraged CA support groups/ counseling

## 2017-05-09 NOTE — Assessment & Plan Note (Addendum)
Changed to doxycycline 6 days ago.  Continue medications till gone. For PNA ( previously on Levaquin and developed pneumonia despite on antibiotics)   - Consider Z-Pak next line if not completely cleared/ feeling better  - Patient will need repeat chest x-ray to check for clearance of pneumonia approximately 3 weeks after she is feeling better.  Told pt to f/up for this here is fine.  - Patient has Hycodan tablets for cough, use when necessary.  - Explained to patient extensively that this pneumonia would take a 76 year old woman a month or more to get over, and since she's been through recent radiation and chemotherapy, will take much longer.  - Healthy diet high in antioxidants, adequate sleep, stress management, all discussed with patient.  -  3 deep breaths every 15 minutes to prevent worsening atelectasis.

## 2017-05-09 NOTE — Progress Notes (Signed)
Impression and Recommendations:    1. Pneumonia of left lower lobe due to infectious organism ([place])   2. S/P chemotherapy, time since 4-12 weeks   3. S/P radiation 4-12 weeks ago   4. Cough   5. Burning sensation- scalp esp at night   6. Fatigue, unspecified type   7. Wheeze   8. Difficulty sleeping   9. Chemotherapy induced neutropenia (HCC)   10. Squamous cell carcinoma of anal canal ([name])   11. Squamous cell carcinoma of rectum (HCC)   12. Depression, unspecified depression type   13. History of breast Cancer     Pneumonia of left lower lobe due to infectious organism ([name]) - Plan: albuterol (PROVENTIL HFA;VENTOLIN HFA) 108 (90 Base) MCG/ACT inhaler  S/P chemotherapy, time since 4-12 weeks  S/P radiation 4-12 weeks ago  Cough  Burning sensation- scalp esp at night - Plan: amitriptyline (ELAVIL) 25 MG tablet  Fatigue, unspecified type  Wheeze - Plan: albuterol (PROVENTIL HFA;VENTOLIN HFA) 108 (90 Base) MCG/ACT inhaler  Difficulty sleeping - Plan: amitriptyline (ELAVIL) 25 MG tablet  Chemotherapy induced neutropenia (HCC)  Squamous cell carcinoma of anal canal (HCC)  Squamous cell carcinoma of rectum (HCC)  Depression, unspecified depression type  History of breast Cancer  Pneumonia of left lower lobe due to infectious organism ([name]) Changed to doxycycline 6 days ago.  Continue medications till gone. For PNA ( previously on Levaquin and developed pneumonia despite on antibiotics)   - Consider Z-Pak next line if not completely cleared/ feeling better  - Patient will need repeat chest x-ray to check for clearance of pneumonia approximately 3 weeks after she is feeling better.  Told pt to f/up for this here is fine.  - Patient has Hycodan tablets for cough, use when necessary.  - Explained to patient extensively that this pneumonia would take a 76 year old woman a month or more to get over, and since she's been through recent radiation and chemotherapy,  will take much longer.  - Healthy diet high in antioxidants, adequate sleep, stress management, all discussed with patient.  -  3 deep breaths every 15 minutes to prevent worsening atelectasis.      Wheeze Nebulizer treatment in office today with improved aeration of lungs.  - Albuterol inhaler prescription written.  Use as needed only.   Burning sensation- scalp esp at night Amitriptyline after risk benefits discussed with patient.  - Likely side effect of chemotherapy/  radiation.  - She will let me know if she develops any rash or flaky skin, and which case we can try ketoconazole shampoo.   Difficulty sleeping Amitriptyline prescription given.  - We discussed briefly we might want to increase her Zoloft dose since the increased stress of chemoradiation and all she's been through, however, addition of Elavil will increase effects of Zoloft.    We'll continue to monitor  Encouraged CA support groups/ counseling     The patient was counseled, risk factors were discussed, anticipatory guidance given.   New Prescriptions   ALBUTEROL (PROVENTIL HFA;VENTOLIN HFA) 108 (90 BASE) MCG/ACT INHALER    Inhale 1-2 puffs into the lungs every 4 (four) hours as needed for wheezing.   AMITRIPTYLINE (ELAVIL) 25 MG TABLET    Take 1 tablet (25 mg total) by mouth at bedtime.    Discontinued Medications   HYDROCODONE-HOMATROPINE 5-1.5 MG TABS    Take 1 tablet by mouth every 6 (six) hours as needed.   LEVOFLOXACIN (LEVAQUIN) 500 MG TABLET    Take  1 tablet (500 mg total) by mouth daily.    Modified Medications   No medications on file     Meds ordered this encounter  Medications  . albuterol (PROVENTIL HFA;VENTOLIN HFA) 108 (90 Base) MCG/ACT inhaler    Sig: Inhale 1-2 puffs into the lungs every 4 (four) hours as needed for wheezing.    Dispense:  2 Inhaler    Refill:  11  . amitriptyline (ELAVIL) 25 MG tablet    Sig: Take 1 tablet (25 mg total) by mouth at bedtime.     Dispense:  90 tablet    Refill:  1     No orders of the defined types were placed in this encounter.    Gross side effects, risk and benefits, and alternatives of medications and treatment plan in general discussed with patient.  Patient is aware that all medications have potential side effects and we are unable to predict every side effect or drug-drug interaction that may occur.   Patient will call with any questions prior to using medication if they have concerns.  Expresses verbal understanding and consents to current therapy and treatment regimen.  No barriers to understanding were identified.  Red flag symptoms and signs discussed in detail.  Patient expressed understanding regarding what to do in case of emergency\urgent symptoms  Please see AVS handed out to patient at the end of our visit for further patient instructions/ counseling done pertaining to today's office visit.   Return in about 8 weeks (around 07/04/2017), or if symptoms worsen or fail to improve.     Note: This document was prepared using Dragon voice recognition software and may include unintentional dictation errors.  [name] 5:16 PM --------------------------------------------------------------------------------------------------------------------------------------------------------------------------------------------------------------------------------------------    Subjective:    CC:  Chief Complaint  Patient presents with  . Pneumonia    follow up - still having cough and SOB, fatigue    HPI: Denise Wolfe is a 76 y.o. female who presents to [place] at [facility] today for issues as discussed below.  Patient was recently seen by her oncologist on 9\21\18.  At that time she was still having low-grade temperatures and cough, congestion, shortness of breath and fatigue.  Chest x-ray was done on 05/03/2017 which showed density at the left base consistent with atelectasis and/or  pneumonia.   UA obtained and per OV notes from Onc- wnl's.     (( Recent office visit note from her oncologist on [phone]:   This patient was seen with Dr. Burr Medico with my treatment plan reviewed with her. She expressed agreement with my medical management of this patient.  I have seen the patient, examined her. I agree with the assessment and and plan and have edited the notes.   Dysuria colon: A urinalysis was collected today.   Cough with pneumonia:. The patient was referred for a chest x-ray which showed an area of atelectasis versus pneumonia in the left lung base. The patient was given a prescription for doxycycline 100 mg by mouth twice a day 7 days and was instructed to discontinue Levaquin. Please see After Visit Summary for patient specific instructions. CBC and CMET were collected today. Results are as noted below. The patient is status post a Neulasta injection on 04/29/2017.  She is recovering slowly from the chemotherapy and irradiation. Neutropenia resolved with Neulasta. I reviewed her chest x-ray, left lower lobe pneumonia is not ruled out, giving her clinical symptoms, slightly worsening cough, exam showed a bilateral rhonchi,  I'll change  her antibiotics from Levaquin to doxycycline. She knows to call us if not getting better.  Truitt Merle  05/03/2017 ))  Problem  Pneumonia of Left Lower Lobe Due to Infectious Organism (Hcc)  Difficulty Sleeping  Burning sensation- scalp esp at night  Wheeze     Wt Readings from Last 3 Encounters:  05/09/17 130 lb 8 oz (59.2 kg)  05/03/17 131 lb 11.2 oz (59.7 kg)  04/29/17 133 lb 6.4 oz (60.5 kg)   BP Readings from Last 3 Encounters:  05/09/17 116/68  05/03/17 (!) 128/58  04/29/17 131/60   Pulse Readings from Last 3 Encounters:  05/09/17 (!) 106  05/03/17 90  04/29/17 (!) 112   BMI Readings from Last 3 Encounters:  05/09/17 23.87 kg/m  05/03/17 24.09 kg/m  04/29/17 24.40 kg/m     Patient Care Team    Relationship  Specialty Notifications Start End  [name], DO PCP - General Family Medicine  05/10/16   Silverio Decamp, MD Consulting Physician Family Medicine  07/25/16    Comment: OA - shoulder injeciton     Patient Active Problem List   Diagnosis Date Noted  . HLD (hyperlipidemia) 07/19/2016    Priority: High  . Elevated HDL:  >er 90 07/19/2016    Priority: High  . History of breast Cancer 05/10/2016    Priority: High  . HTN (hypertension), benign 05/10/2016    Priority: High  . 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 Depression 05/10/2016    Priority: Medium  . Vitamin D insufficiency 07/19/2016    Priority: Low  . Degenerative joint disease (DJD) of sternoclavicular joint, right 07/19/2016    Priority: Low  . Pain in joint, shoulder region 05/10/2016    Priority: Low  . Pneumonia of left lower lobe due to infectious organism ([place]) 05/09/2017  . Difficulty sleeping 05/09/2017  . Burning sensation- scalp esp at night 05/09/2017  . Wheeze 05/09/2017  . Chemotherapy induced neutropenia ([place]) 04/14/2017  . Squamous cell carcinoma of anal canal ([place]) 04/11/2017  . Squamous cell carcinoma of rectum ([place]) 02/14/2017  . Attention deficit disorder 12/27/2016  . Multiple atypical nevi 12/27/2016  . Nonspecific chest pain 08/08/2016  . Retrosternal chest pain 07/19/2016  . Cataract 05/10/2016    Past Medical history, Surgical history, Family history, Social history, Allergies and Medications have been entered into the medical record, reviewed and changed as needed.    Current Meds  Medication Sig  . amLODipine (NORVASC) 5 MG tablet Take 1 tablet (5 mg total) by mouth daily.  Marland Kitchen amphetamine-dextroamphetamine (ADDERALL) 10 MG tablet Take 1 tablet (10 mg total) by mouth 2 (two) times daily.  Marland Kitchen atorvastatin (LIPITOR) 80 MG tablet Take 0.5 tablets (40 mg total) by mouth daily at 6 PM.  . cholecalciferol (VITAMIN D) 400 units TABS tablet Take 400  Units by mouth daily.  Marland Kitchen doxycycline (VIBRA-TABS) 100 MG tablet Take 1 tablet (100 mg total) by mouth 2 (two) times daily.  Marland Kitchen losartan-hydrochlorothiazide (HYZAAR) 50-12.5 MG tablet Start with one half tablet daily for 1 week, then 1 full tablet daily (Patient taking differently: Take 1 tablet by mouth daily. Start with one half tablet daily for 1 week, then 1 full tablet daily)  . pramoxine (PROCTOFOAM) 1 % foam Place 1 application rectally 3 (three) times daily as needed for anal itching or anal irritation (pain).  Marland Kitchen sertraline (ZOLOFT) 50 MG tablet Take 1 tablet (50 mg total) by mouth daily.  . [  DISCONTINUED] Hydrocodone-Homatropine 5-1.5 MG TABS Take 1 tablet by mouth every 6 (six) hours as needed.  . [DISCONTINUED] levofloxacin (LEVAQUIN) 500 MG tablet Take 1 tablet (500 mg total) by mouth daily.   Current Facility-Administered Medications for the 05/09/17 encounter (Office Visit) with [name], DO  Medication  . 0.9 %  sodium chloride infusion    Allergies:  Allergies  Allergen Reactions  . Ibuprofen Rash     Review of Systems: General:   Denies fever, chills, unexplained weight loss.  Optho/Auditory:   Denies visual changes, blurred vision/LOV Respiratory:   Denies wheeze, DOE more than baseline levels.  Cardiovascular:   Denies chest pain, palpitations, new onset peripheral edema  Gastrointestinal:   Denies nausea, vomiting, diarrhea, abd pain.  Genitourinary: Denies dysuria, freq/ urgency, flank pain or discharge from genitals.  Endocrine:     Denies hot or cold intolerance, polyuria, polydipsia. Musculoskeletal:   Denies unexplained myalgias, joint swelling, unexplained arthralgias, gait problems.  Skin:  Denies new onset rash, suspicious lesions Neurological:     Denies dizziness, unexplained weakness, numbness  Psychiatric/Behavioral:   Denies mood changes, suicidal or homicidal ideations, hallucinations    Objective:   Blood pressure 116/68, pulse (!) 106,  height 5\' 2"  (1.575 m), weight 130 lb 8 oz (59.2 kg). Body mass index is 23.87 kg/m. General:  Well Developed, well nourished, appropriate for stated age.  Neuro:  Alert and oriented,  extra-ocular muscles intact  HEENT:  Normocephalic, atraumatic, neck supple, no carotid bruits appreciated  Skin:  no gross rash, warm, pink. Cardiac:  RRR, S1 S2 Respiratory:  ECTA B/L and A/P, Not using accessory muscles, speaking in full sentences- unlabored. Vascular:  Ext warm, no cyanosis apprec.; cap RF less 2 sec. Psych:  No HI/SI, judgement and insight good, Euthymic mood. Full Affect.

## 2017-05-09 NOTE — Assessment & Plan Note (Signed)
Nebulizer treatment in office today with improved aeration of lungs.  - Albuterol inhaler prescription written.  Use as needed only.

## 2017-05-09 NOTE — Patient Instructions (Addendum)
May take 2 of Elavil's at night as needed for help with sleep and burning scalp sensation.  If you do this, please let me know we will send a new prescription.    Community-Acquired Pneumonia, Adult Pneumonia is an infection of the lungs. One type of pneumonia can happen while a person is in a hospital. A different type can happen when a person is not in a hospital (community-acquired pneumonia). It is easy for this kind to spread from person to person. It can spread to you if you breathe near an infected person who coughs or sneezes. Some symptoms include:  A dry cough.  A wet (productive) cough.  Fever.  Sweating.  Chest pain.  Follow these instructions at home:  Take over-the-counter and prescription medicines only as told by your doctor. ? Only take cough medicine if you are losing sleep. ? If you were prescribed an antibiotic medicine, take it as told by your doctor. Do not stop taking the antibiotic even if you start to feel better.  Sleep with your head and neck raised (elevated). You can do this by putting a few pillows under your head, or you can sleep in a recliner.  Do not use tobacco products. These include cigarettes, chewing tobacco, and e-cigarettes. If you need help quitting, ask your doctor.  Drink enough water to keep your pee (urine) clear or pale yellow. A shot (vaccine) can help prevent pneumonia. Shots are often suggested for:  People older than 74 years of age.  People older than 74 years of age: ? Who are having cancer treatment. ? Who have long-term (chronic) lung disease. ? Who have problems with their body's defense system (immune system).  You may also prevent pneumonia if you take these actions:  Get the flu (influenza) shot every year.  Go to the dentist as often as told.  Wash your hands often. If soap and water are not available, use hand sanitizer.  Contact a doctor if:  You have a fever.  You lose sleep because your cough medicine  does not help. Get help right away if:  You are short of breath and it gets worse.  You have more chest pain.  Your sickness gets worse. This is very serious if: ? You are an older adult. ? Your body's defense system is weak.  You cough up blood. This information is not intended to replace advice given to you by your health care provider. Make sure you discuss any questions you have with your health care provider. Document Released: 01/16/2008 Document Revised: 01/05/2016 Document Reviewed: 11/24/2014 Elsevier Interactive Patient Education  [employer].

## 2017-05-10 NOTE — Progress Notes (Signed)
[facility]  [phone] Fax:([phone]) [phone]  Clinic Follow up Note   Patient Care Team: [name], DO as PCP - General (Family Medicine) Silverio Decamp, MD as Consulting Physician (Family Medicine) 05/14/2017    SUMMARY OF ONCOLOGIC HISTORY:   Squamous cell carcinoma of rectum ([place])   02/11/2017 Procedure    Colonoscopy 02/11/17 IMPRESSION - One 12 to 15 mm polyp in the rectum (vs proximal anus). Biopsied. Firm, - just inside anal verge or on it? - One 2 mm polyp in the descending colon, removed with a cold biopsy forceps. Resected and retrieved. - The examination was otherwise normal on direct and retroflexion views.       02/11/2017 Pathology Results    Diagnosis 02/11/17 1. Colon, biopsy, descending, polyp - TUBULAR ADENOMA (ONE FRAGMENT). - NO HIGH GRADE DYSPLASIA OR MALIGNANCY. 2. Colon, polyp(s), rectum, polyp - biopsy only - SQUAMOUS CELL CARCINOMA, BASALOID-TYPE. - SEE MICROSCOPIC DESCRIPTION. Microscopic Comment 2. There are multiple fragments of rectal mucosa which are involved by poorly differentiated squamous cell carcinoma with basaloid features.      02/14/2017 Initial Diagnosis    Squamous cell carcinoma of rectum ([place])     02/20/2017 Imaging    CT CAP W Contrast 02/20/17 IMPRESSION: 1. Abnormal right pelvic sidewall lymph node measuring 1.9 x 1.6 cm. Imaging features highly suspicious for metastatic disease. PET-CT may prove helpful to further evaluate. Borderline lymph node identified at the right iliac bifurcation and an upper normal size lymph node is identified along the left external iliac artery chain. 2. Asymmetric wall thickening distal rectum, presumably the location of primary neoplasm. 3. Asymmetric airspace opacity in the left apex has a wedge shaped configuration on coronal imaging. This may be an area of scarring or potentially infectious/inflammatory etiology. Attention on follow-up recommended. 4.  Tiny right perifissural nodule in the right middle lobe, likely subpleural lymph node but attention on follow-up recommended. 5. Multiple low-density well-defined liver lesions, most suggestive of cysts. 6.  Aortic Atherosclerois (ICD10-170.0)      02/25/2017 PET scan    PET 02/25/17 IMPRESSION: 1. Focal accentuated activity in the anus as detailed above, with bilateral external iliac hypermetabolic adenopathy indicating metastatic disease. No hypermetabolic lesion of the liver or other findings of metastatic disease. 2. The tiny nodule adjacent to the minor fissure does not demonstrate hypermetabolic activity but is below sensitive PET-CT size thresholds. This may warrant surveillance. 3. Other imaging findings of potential clinical significance: Aortic Atherosclerosis (ICD10-I70.0). Coronary atherosclerosis. Asymmetric right degenerative glenohumeral arthropathy. Small calcified thyroid nodules are present but not hypermetabolic.      03/11/2017 - 04/25/2017 Radiation Therapy    Concurrent Chemo Radiation with Dr. Lisbeth Renshaw      03/11/2017 - 04/25/2017 Chemotherapy    Concurrent chemotherapoy and Radiation with mitomycin C on Day 1 and 29 and 5-fluorouracil on day 1-5, 29-33, will no longer include mitomycin starting with cycle 2.         CURRENT THERAPY:  Surveillance     INTERVAL HISTORY:   Denise Wolfe is here for a follow up. She presents to the clinic today accompanied by her daughter. She repots to feeling horrible. She feels fatigued and will get dyspnea upon exertion. Fatigue is her main issue. Taking a shower will exhaust her and walking in the parking lot will cause dizziness. She has not been driving in the past 2 weeks. Her PCP had prescribed her an inhaler that helps her some.  She still has a cough  but it has improved. She barely coughs up phlegm and she has remained afebrile.  She took ELAVIL for her scalp itching/rash which caused numbness down her right arm so she  stopped using it.  Her BM is formed and they occur 3-4 times a day.     REVIEW OF SYSTEMS:   Constitutional: Denies fevers, chills or abnormal weight loss (+) severe fatigue (+) dizziness upon exertion  Eyes: Denies blurriness of vision Ears, nose, mouth, throat, and face: Denies mucositis or sore throat Respiratory: Denies dyspnea or wheezes (+) persistent dry cough, improved (+) dysapnea upon exertion  Cardiovascular: Denies palpitation, chest discomfort or lower extremity swelling Gastrointestinal:  Denies nausea, heartburn or change in bowel habits (+) hemorrhoid pain, intermittent pain during BM from radiation.  Skin: Denies abnormal skin rashes  Lymphatics: Denies new lymphadenopathy or easy bruising Neurological:Denies numbness, tingling or new weaknesses Behavioral/Psych: Mood is stable, no new changes  All other systems were reviewed with the patient and are negative.   MEDICAL HISTORY:  Past Medical History:  Diagnosis Date  . Anemia    during childhood  . Arthritis    hands, shoulder  . Breast cancer ([place]) 03/2000   left side modified mastecomy  . Cancer [facility])    breast, age 76; left side  . Colon cancer ([place]) 02/11/2017   rectum-squamous cell carcinoma  . Depression   . Hypertension   . Squamous cell carcinoma of rectum ([place]) 02/14/2017    SURGICAL HISTORY: Past Surgical History:  Procedure Laterality Date  . ABDOMINAL HYSTERECTOMY     With bilateral oophorectomy. 2002.  Marland Kitchen BREAST BIOPSY Right 2004   benign  . BREAST SURGERY Left 2001   biopsy  . CATARACT EXTRACTION Bilateral 2017  . IR FLUORO GUIDE CV LINE RIGHT  03/11/2017  . IR FLUORO GUIDE CV LINE RIGHT  04/16/2017  . IR US GUIDE VASC ACCESS RIGHT  03/11/2017  . IR US GUIDE VASC ACCESS RIGHT  04/16/2017  . MASTECTOMY MODIFIED RADICAL Left 2001  . MENISCUS DEBRIDEMENT Right 2003  . TUMOR EXCISION Right    thigh area    I have reviewed the social history and family history with the patient and they are  unchanged from previous note.  ALLERGIES:  is allergic to ibuprofen.  MEDICATIONS:  Current Outpatient Prescriptions  Medication Sig Dispense Refill  . albuterol (PROVENTIL HFA;VENTOLIN HFA) 108 (90 Base) MCG/ACT inhaler Inhale 1-2 puffs into the lungs every 4 (four) hours as needed for wheezing. 2 Inhaler 11  . amLODipine (NORVASC) 5 MG tablet Take 1 tablet (5 mg total) by mouth daily. 90 tablet 3  . amphetamine-dextroamphetamine (ADDERALL) 10 MG tablet Take 1 tablet (10 mg total) by mouth 2 (two) times daily. 60 tablet 0  . atorvastatin (LIPITOR) 80 MG tablet Take 0.5 tablets (40 mg total) by mouth daily at 6 PM. 45 tablet 3  . cholecalciferol (VITAMIN D) 400 units TABS tablet Take 400 Units by mouth daily.    Marland Kitchen losartan-hydrochlorothiazide (HYZAAR) 50-12.5 MG tablet Start with one half tablet daily for 1 week, then 1 full tablet daily (Patient taking differently: Take 1 tablet by mouth daily. Start with one half tablet daily for 1 week, then 1 full tablet daily) 90 tablet 3  . pramoxine (PROCTOFOAM) 1 % foam Place 1 application rectally 3 (three) times daily as needed for anal itching or anal irritation (pain). 15 g 1  . sertraline (ZOLOFT) 50 MG tablet Take 1 tablet (50 mg total) by mouth daily. [place]  tablet 3   Current Facility-Administered Medications  Medication Dose Route Frequency Provider Last Rate Last Dose  . 0.9 %  sodium chloride infusion  500 mL Intravenous Continuous [name], MD       Facility-Administered Medications Ordered in Other Visits  Medication Dose Route Frequency Provider Last Rate Last Dose  . 0.9 %  sodium chloride infusion  250 mL Intravenous Once Truitt Merle, MD      . heparin lock flush 100 unit/mL  500 Units Intracatheter Daily PRN Truitt Merle, MD      . heparin lock flush 100 unit/mL  250 Units Intracatheter PRN Truitt Merle, MD      . sodium chloride flush (NS) 0.9 % injection 10 mL  10 mL Intracatheter PRN Truitt Merle, MD      . sodium chloride flush (NS) 0.9  % injection 3 mL  3 mL Intracatheter PRN Truitt Merle, MD        PHYSICAL EXAMINATION: ECOG PERFORMANCE STATUS: 1 Vitals:   05/14/17 0901  BP: (!) 112/55  Pulse: 95  Resp: (!) 24  Temp: 98.3 F (36.8 C)  TempSrc: Oral  SpO2: 100%  Weight: 132 lb 8 oz (60.1 kg)  Height: 5\' 2"  (1.575 m)     GENERAL:alert, no distress and comfortable SKIN: skin color, texture, turgor are normal, no rashes or significant lesions EYES: normal, Conjunctiva are pink and non-injected, sclera clear OROPHARYNX:no exudate, no erythema and lips, buccal mucosa, and tongue normal  NECK: supple, thyroid normal size, non-tender, without nodularity LYMPH:  no palpable lymphadenopathy in the cervical, axillary or inguinal LUNGS: clear to auscultation and percussion with normal breathing effort HEART: regular rate & rhythm and no murmurs and no lower extremity edema ABDOMEN:abdomen soft, non-tender and normal bowel sounds, No local megaly. Digital exam deferred today, scan showed a 1.5 cm anterior low rectum mass.  Musculoskeletal:no cyanosis of digits and no clubbing  NEURO: alert & oriented x 3 with fluent speech, no focal motor/sensory deficits RECTAL (+) rectal and inguinal areas have skin hyperpigmentation with no skin ulcers or discharge, she is healing well from radiaiton   LABORATORY DATA:  I have reviewed the data as listed CBC Latest Ref Rng & Units 05/14/2017 05/03/2017 04/29/2017  WBC 3.9 - 10.3 10e3/uL 2.0(L) 35.2(H) 1.3(L)  Hemoglobin 11.6 - 15.9 g/dL 8.4(L) 9.4(L) 9.1(L)  Hematocrit 34.8 - 46.6 % 23.8(L) 27.1(L) 25.6(L)  Platelets 145 - 400 10e3/uL 404(H) 162 279     CMP Latest Ref Rng & Units 05/14/2017 05/03/2017 04/29/2017  Glucose 70 - 140 mg/dl 92 98 94  BUN 7.0 - 26.0 mg/dL 24.3 14.2 14.6  Creatinine 0.6 - 1.1 mg/dL 0.8 0.8 0.7  Sodium 136 - 145 mEq/L 141 139 139  Potassium 3.5 - 5.1 mEq/L 3.5 4.0 3.9  Chloride 101 - 111 mmol/L - - -  CO2 22 - 29 mEq/L 25 27 24   Calcium 8.4 - 10.4 mg/dL 9.4  9.3 8.9  Total Protein 6.4 - 8.3 g/dL 6.1(L) 6.3(L) 6.2(L)  Total Bilirubin 0.20 - 1.20 mg/dL 0.26 0.35 0.22  Alkaline Phos 40 - 150 U/L 54 138 35(L)  AST 5 - 34 U/L 15 24 12   ALT 0 - 55 U/L 18 11 7    ANC 3.1K today   PATHOLOGY Diagnosis 02/11/17 1. Colon, biopsy, descending, polyp - TUBULAR ADENOMA (ONE FRAGMENT). - NO HIGH GRADE DYSPLASIA OR MALIGNANCY. 2. Colon, polyp(s), rectum, polyp - biopsy only - SQUAMOUS CELL CARCINOMA, BASALOID-TYPE. - SEE MICROSCOPIC DESCRIPTION. Microscopic Comment  2. There are multiple fragments of rectal mucosa which are involved by poorly differentiated squamous cell carcinoma with basaloid features.   PROCEDURES Colonoscopy 02/11/17 IMPRESSION - One 12 to 15 mm polyp in the rectum (vs proximal anus). Biopsied. Firm, - just inside anal verge or on it? - One 2 mm polyp in the descending colon, removed with a cold biopsy forceps. Resected and retrieved. - The examination was otherwise normal on direct and retroflexion views.   RADIOGRAPHIC STUDIES: I have personally reviewed the radiological images as listed and agreed with the findings in the report. No results found.   PET 02/25/17 IMPRESSION: 1. Focal accentuated activity in the anus as detailed above, with bilateral external iliac hypermetabolic adenopathy indicating metastatic disease. No hypermetabolic lesion of the liver or other findings of metastatic disease. 2. The tiny nodule adjacent to the minor fissure does not demonstrate hypermetabolic activity but is below sensitive PET-CT size thresholds. This may warrant surveillance. 3. Other imaging findings of potential clinical significance: Aortic Atherosclerosis (ICD10-I70.0). Coronary atherosclerosis. Asymmetric right degenerative glenohumeral arthropathy. Small calcified thyroid nodules are present but not hypermetabolic.  ASSESSMENT & PLAN:  Denise Wolfe is here for a follow up of rectal SCC. She has a past medical history of  breast cancer in 2001, Anemia, Arthritis and HTN.   1. Squamous Cell Carcinoma of Rectum, [id number] -She underwent a screening colonoscopy 02/11/2017. Findings included a 12-15 mm polyp in the rectum, semi-sessile. A 2 mm polyp was found in the descending colon. Pathology on the rectal polyp showed squamous cell carcinoma, basaloid type. The descending colon polyp showed tubular adenoma -CT scans of the chest/abdomen/pelvis 02/19/2017 showed an abnormal right pelvic sidewall lymph node measuring 1.9 x 1.6 cm -We discussed her PET from 02/25/17. The PET shows enlarged hypermetabolic bilateral external iliac lymph nodes (2), no distant metastasis.  -Patient had a question if her pelvic and inguinal metastasis from her previous breast cancer. Were discussed this is unlikely, given the distribution of the lymph nodes which is more consistent with rectal cancer metastasis. Her previous breast cancer was diagnosed 17 years ago, hormonal sensitive, and she received a standard treatment. No evidence of recurrence since then. -Although she has locally advanced disease, this is still considered curable with concurrent chemotherapy and radiation. -She completed the recommended standard chemotherapy with 5-FU and mitomycin regimen, with concurrent radiation on 04/25/17. She tolerated well at first but developed prolonged neutropenia, day 28 chemo was postponed for one weekand she received 5-fu only for second cycle. Post treatment she is recovering with intermittent BM irritation and lower blood counts.  -Labs reviewed and her HG is 8.4. She is quite symptomatic from her anemia. I recommend her to have blood transfusion today, potential benefits and side effects discussed with patient, blood transfusion consent was obtained from patient. I discussed her fatigue could also be caused by her anemia in addition to chemotherapy. She still has neutropenia but ANC with ANC 1K today.  -repeat PET scan and rectal exam in 2-3 months  to moniter her response to chemotherapy.  -F/u in 4 weeks and she knows to contact us if she is not recovering well or develops fever.  -she will get flu shot from PCP in the next few weeks    2. Fatigue and dyspnea  -72 to her moderate anemia, and the recent chemotherapy and irradiation -She will receive blood transfusion today -I encouraged her she more and try to be physically active  3. Hx of left Breast Cancer, stage 3 -  Diagnosed in 2001 -Had mastectomy and ALND, adjuvant chemo therapy and radiation, took tamoxifen and aromasin -Mother diagnosed at age 70. Declines referral to genetics counselor. -We discussed that it is not likely that breast cancer has occurred in her rectum. -I previously recommend genetic testing due to her family history and personal history of breast cancer. She will think about it -Breast exam was normal on 03/25/2017  4. Hypertension  - currently on Norvasc and Hyzaar. -BP was 164/59 last visit and much improved to 131/60  (04/29/17).  -BP is 112/55 today (05/14/17)  5. Depression - currently on Zoloft.  6. Hypercholesterolemia -on Lipitor.  7. Anemia and neutropenia -Second to chemotherapy, neutropenia precautions reviewed with patient again. -Continue monitoring   8. dry cough  -She developed a worsening cough and presented to ED on 04/27/17, CXR was negative, exam today unremarkable  -I discussed her continuing her antibiotics and I ordered cough syrup to take every 6 hours as needed.  -Cough has much improved, still has some residual cough   PLAN:  -Lab today for type and cross, and iron study  -HG is 8.4 today, will give 2u RBC transfusion 4 hours today -Blood transfusion consent was obtained from patient today. -Lab and f/u in 4 weeks.     Orders Placed This Encounter  Procedures  . Iron and TIBC    Standing Status:   Future    Number of Occurrences:   1    Standing Expiration Date:   05/14/2018  . Ferritin    Standing Status:    Future    Number of Occurrences:   1    Standing Expiration Date:   05/14/2018   All questions were answered. The patient knows to call the clinic with any problems, questions or concerns. No barriers to learning was detected.  I spent 20 minutes counseling the patient face to face. The total time spent in the appointment was 25 minutes and more than 50% was on counseling and review of test results  This document serves as a record of services personally performed by Truitt Merle, MD. It was created on her behalf by Joslyn Devon, a trained medical scribe. The creation of this record is based on the scribe's personal observations and the provider's statements to them. This document has been checked and approved by the attending provider.    Truitt Merle, MD 05/14/2017

## 2017-05-13 ENCOUNTER — Telehealth: Payer: Self-pay | Admitting: Family Medicine

## 2017-05-13 NOTE — Telephone Encounter (Signed)
Pt called states she is exprienancing some numbness in rt arm & thigh/legs after taking the amitriptyline and has since stopped taking it-- Pt wishes provider/MA to call her to discuss symptoms & possible Rx for a diff medication. --glh

## 2017-05-13 NOTE — Telephone Encounter (Signed)
Pt began amitriptyline on 05/09/17 and began having numbness on her right side the next day.  Pt states that she felt like she "did not have complete control of her right arm at that time.  Pt states that she has not taken the medication since 05/10/17 and is still having these symptoms.  Please advise.  Charyl Bigger, CMA

## 2017-05-13 NOTE — Telephone Encounter (Signed)
stop the medicine.  Do not take again in the future.    It will take 4-5 days for it to get completely out of her system so by end of the day tomorrow- if she's not significantly feeling better we would have to explore other reasons beside side effect of the medicine for her having those symptoms.

## 2017-05-13 NOTE — Telephone Encounter (Signed)
Called patient and notified.  She will follow up with [place] if symptoms do not improve.  MPulliam, CMA/RT(R)

## 2017-05-14 ENCOUNTER — Ambulatory Visit: Payer: Medicare Other

## 2017-05-14 ENCOUNTER — Other Ambulatory Visit: Payer: Self-pay | Admitting: *Deleted

## 2017-05-14 ENCOUNTER — Encounter: Payer: Self-pay | Admitting: Hematology

## 2017-05-14 ENCOUNTER — Other Ambulatory Visit (HOSPITAL_BASED_OUTPATIENT_CLINIC_OR_DEPARTMENT_OTHER): Payer: Medicare Other

## 2017-05-14 ENCOUNTER — Ambulatory Visit (HOSPITAL_COMMUNITY)
Admission: RE | Admit: 2017-05-14 | Discharge: 2017-05-14 | Disposition: A | Discharge status: Home / Self Care | Payer: Medicare Other | Source: Ambulatory Visit | Attending: Hematology | Admitting: Hematology

## 2017-05-14 ENCOUNTER — Ambulatory Visit (HOSPITAL_BASED_OUTPATIENT_CLINIC_OR_DEPARTMENT_OTHER): Payer: Medicare Other

## 2017-05-14 ENCOUNTER — Telehealth: Payer: Self-pay | Admitting: Hematology

## 2017-05-14 ENCOUNTER — Ambulatory Visit (HOSPITAL_BASED_OUTPATIENT_CLINIC_OR_DEPARTMENT_OTHER): Payer: Medicare Other | Admitting: Hematology

## 2017-05-14 VITALS — BP 112/55 | HR 95 | Temp 98.3°F | Resp 24 | Ht 62.0 in | Wt 132.5 lb

## 2017-05-14 DIAGNOSIS — X58XXXS Exposure to other specified factors, sequela: Secondary | ICD-10-CM | POA: Insufficient documentation

## 2017-05-14 DIAGNOSIS — R5383 Other fatigue: Secondary | ICD-10-CM | POA: Diagnosis not present

## 2017-05-14 DIAGNOSIS — C20 Malignant neoplasm of rectum: Secondary | ICD-10-CM

## 2017-05-14 DIAGNOSIS — T451X5A Adverse effect of antineoplastic and immunosuppressive drugs, initial encounter: Principal | ICD-10-CM

## 2017-05-14 DIAGNOSIS — F329 Major depressive disorder, single episode, unspecified: Secondary | ICD-10-CM | POA: Diagnosis not present

## 2017-05-14 DIAGNOSIS — C801 Malignant (primary) neoplasm, unspecified: Secondary | ICD-10-CM

## 2017-05-14 DIAGNOSIS — Z853 Personal history of malignant neoplasm of breast: Secondary | ICD-10-CM

## 2017-05-14 DIAGNOSIS — D701 Agranulocytosis secondary to cancer chemotherapy: Secondary | ICD-10-CM

## 2017-05-14 DIAGNOSIS — D630 Anemia in neoplastic disease: Secondary | ICD-10-CM

## 2017-05-14 DIAGNOSIS — T451X5S Adverse effect of antineoplastic and immunosuppressive drugs, sequela: Secondary | ICD-10-CM | POA: Diagnosis not present

## 2017-05-14 DIAGNOSIS — I10 Essential (primary) hypertension: Secondary | ICD-10-CM | POA: Diagnosis not present

## 2017-05-14 DIAGNOSIS — D6481 Anemia due to antineoplastic chemotherapy: Secondary | ICD-10-CM

## 2017-05-14 DIAGNOSIS — R05 Cough: Secondary | ICD-10-CM

## 2017-05-14 DIAGNOSIS — R0609 Other forms of dyspnea: Secondary | ICD-10-CM

## 2017-05-14 LAB — FERRITIN: Ferritin: 351 ng/ml — ABNORMAL HIGH (ref 9–269)

## 2017-05-14 LAB — ABO/RH: ABO/RH(D): AB POS

## 2017-05-14 LAB — PREPARE RBC (CROSSMATCH)

## 2017-05-14 LAB — CBC WITH DIFFERENTIAL/PLATELET
BASO%: 3.5 % — AB (ref 0.0–2.0)
BASOS ABS: 0.1 10*3/uL (ref 0.0–0.1)
EOS%: 8.6 % — AB (ref 0.0–7.0)
Eosinophils Absolute: 0.2 10*3/uL (ref 0.0–0.5)
HEMATOCRIT: 23.8 % — AB (ref 34.8–46.6)
HGB: 8.4 g/dL — ABNORMAL LOW (ref 11.6–15.9)
LYMPH#: 0.3 10*3/uL — AB (ref 0.9–3.3)
LYMPH%: 13.4 % — AB (ref 14.0–49.7)
MCH: 34.6 pg — AB (ref 25.1–34.0)
MCHC: 35.3 g/dL (ref 31.5–36.0)
MCV: 98 fL (ref 79.5–101.0)
MONO#: 0.5 10*3/uL (ref 0.1–0.9)
MONO%: 27.3 % — ABNORMAL HIGH (ref 0.0–14.0)
NEUT#: 1 10*3/uL — ABNORMAL LOW (ref 1.5–6.5)
NEUT%: 47.2 % (ref 38.4–76.8)
Platelets: 404 10*3/uL — ABNORMAL HIGH (ref 145–400)
RBC: 2.43 10*6/uL — AB (ref 3.70–5.45)
RDW: 18.5 % — ABNORMAL HIGH (ref 11.2–14.5)
WBC: 2 10*3/uL — ABNORMAL LOW (ref 3.9–10.3)

## 2017-05-14 LAB — IRON AND TIBC
%SAT: 43 % (ref 21–57)
Iron: 125 ug/dL (ref 41–142)
TIBC: 290 ug/dL (ref 236–444)
UIBC: 165 ug/dL (ref 120–384)

## 2017-05-14 LAB — COMPREHENSIVE METABOLIC PANEL
ALT: 18 U/L (ref 0–55)
ANION GAP: 8 meq/L (ref 3–11)
AST: 15 U/L (ref 5–34)
Albumin: 3.3 g/dL — ABNORMAL LOW (ref 3.5–5.0)
Alkaline Phosphatase: 54 U/L (ref 40–150)
BUN: 24.3 mg/dL (ref 7.0–26.0)
CALCIUM: 9.4 mg/dL (ref 8.4–10.4)
CHLORIDE: 108 meq/L (ref 98–109)
CO2: 25 meq/L (ref 22–29)
Creatinine: 0.8 mg/dL (ref 0.6–1.1)
EGFR: 70 mL/min/{1.73_m2} — AB (ref >90)
Glucose: 92 mg/dl (ref 70–140)
Potassium: 3.5 mEq/L (ref 3.5–5.1)
Sodium: 141 mEq/L (ref 136–145)
Total Bilirubin: 0.26 mg/dL (ref 0.20–1.20)
Total Protein: 6.1 g/dL — ABNORMAL LOW (ref 6.4–8.3)

## 2017-05-14 MED ORDER — HEPARIN SOD (PORK) LOCK FLUSH 100 UNIT/ML IV SOLN
500.0000 [IU] | Freq: Every day | INTRAVENOUS | Status: DC | PRN
  Filled 2017-05-14: qty 5

## 2017-05-14 MED ORDER — HEPARIN SOD (PORK) LOCK FLUSH 100 UNIT/ML IV SOLN
250.0000 [IU] | INTRAVENOUS | Status: DC | PRN
Start: 2017-05-14 — End: 2017-05-14
  Filled 2017-05-14: qty 5

## 2017-05-14 MED ORDER — SODIUM CHLORIDE 0.9% FLUSH
10.0000 mL | INTRAVENOUS | Status: DC | PRN
  Filled 2017-05-14: qty 10

## 2017-05-14 MED ORDER — SODIUM CHLORIDE 0.9% FLUSH
3.0000 mL | INTRAVENOUS | Status: DC | PRN
  Filled 2017-05-14: qty 10

## 2017-05-14 MED ORDER — SODIUM CHLORIDE 0.9 % IV SOLN
250.0000 mL | Freq: Once | INTRAVENOUS | Status: AC
  Administered 2017-05-14: 250 mL via INTRAVENOUS

## 2017-05-14 MED ORDER — SODIUM CHLORIDE 0.9 % IV SOLN: 250.0000 mL | Freq: Once | INTRAVENOUS | Status: DC

## 2017-05-14 NOTE — Patient Instructions (Signed)

## 2017-05-14 NOTE — Telephone Encounter (Signed)
Gave patient AVS and calendar of upcoming October appointments °

## 2017-05-15 LAB — BPAM RBC
BLOOD PRODUCT EXPIRATION DATE: 201810112359
Blood Product Expiration Date: 201810112359
ISSUE DATE / TIME: 201810021221
ISSUE DATE / TIME: 201810021221
UNIT TYPE AND RH: 6200
Unit Type and Rh: 6200

## 2017-05-15 LAB — TYPE AND SCREEN
ABO/RH(D): AB POS
Antibody Screen: NEGATIVE
UNIT DIVISION: 0
Unit division: 0

## 2017-05-16 ENCOUNTER — Other Ambulatory Visit: Payer: Self-pay | Admitting: Family Medicine

## 2017-05-20 MED ORDER — AMPHETAMINE-DEXTROAMPHETAMINE 10 MG PO TABS: 10.0000 mg | ORAL_TABLET | Freq: Two times a day (BID) | ORAL | 0 refills | Status: DC

## 2017-05-23 ENCOUNTER — Encounter: Payer: Self-pay | Admitting: Radiation Oncology

## 2017-05-23 ENCOUNTER — Telehealth: Payer: Self-pay | Admitting: *Deleted

## 2017-05-23 ENCOUNTER — Ambulatory Visit
Admission: RE | Admit: 2017-05-23 | Discharge: 2017-05-23 | Disposition: A | Discharge status: Home / Self Care | Payer: Medicare Other | Source: Ambulatory Visit | Attending: Radiation Oncology | Admitting: Radiation Oncology

## 2017-05-23 VITALS — BP 121/84 | HR 95 | Temp 98.0°F | Resp 18 | Ht 62.0 in | Wt 130.2 lb

## 2017-05-23 DIAGNOSIS — Z853 Personal history of malignant neoplasm of breast: Secondary | ICD-10-CM | POA: Insufficient documentation

## 2017-05-23 DIAGNOSIS — M1990 Unspecified osteoarthritis, unspecified site: Secondary | ICD-10-CM | POA: Insufficient documentation

## 2017-05-23 DIAGNOSIS — Z8249 Family history of ischemic heart disease and other diseases of the circulatory system: Secondary | ICD-10-CM | POA: Insufficient documentation

## 2017-05-23 DIAGNOSIS — C211 Malignant neoplasm of anal canal: Secondary | ICD-10-CM | POA: Diagnosis not present

## 2017-05-23 DIAGNOSIS — Z803 Family history of malignant neoplasm of breast: Secondary | ICD-10-CM | POA: Diagnosis not present

## 2017-05-23 DIAGNOSIS — Z923 Personal history of irradiation: Secondary | ICD-10-CM | POA: Diagnosis not present

## 2017-05-23 DIAGNOSIS — I10 Essential (primary) hypertension: Secondary | ICD-10-CM | POA: Diagnosis not present

## 2017-05-23 DIAGNOSIS — Z888 Allergy status to other drugs, medicaments and biological substances status: Secondary | ICD-10-CM | POA: Diagnosis not present

## 2017-05-23 DIAGNOSIS — Z90722 Acquired absence of ovaries, bilateral: Secondary | ICD-10-CM | POA: Diagnosis not present

## 2017-05-23 DIAGNOSIS — Z9012 Acquired absence of left breast and nipple: Secondary | ICD-10-CM | POA: Diagnosis not present

## 2017-05-23 DIAGNOSIS — F329 Major depressive disorder, single episode, unspecified: Secondary | ICD-10-CM | POA: Diagnosis not present

## 2017-05-23 DIAGNOSIS — Z90710 Acquired absence of both cervix and uterus: Secondary | ICD-10-CM | POA: Diagnosis not present

## 2017-05-23 DIAGNOSIS — Z79899 Other long term (current) drug therapy: Secondary | ICD-10-CM | POA: Diagnosis not present

## 2017-05-23 DIAGNOSIS — Z510 Encounter for antineoplastic radiation therapy: Secondary | ICD-10-CM | POA: Diagnosis not present

## 2017-05-23 NOTE — Progress Notes (Signed)

## 2017-05-23 NOTE — Telephone Encounter (Signed)
Called patient to inform of fu appt. On 11-28-16 with Shona Simpson, lvm for a return call

## 2017-05-23 NOTE — Progress Notes (Signed)
Radiation Oncology         [phone] ________________________________  Name: Denise Wolfe MRN: [id number]  Date of Service: 05/23/2017 DOB: [birth year]  Post Treatment Note  CC: [name], DO  [name], MD  Diagnosis:   Stage IIIA, [phone] squamous cell carcinoma of the anus.    Interval Since Last Radiation: 4 weeks   03/11/2017 to 04/25/2017: The anus/pelvis was treated to 54 Gy in 30 fractions of 1.8 Gy.  Narrative:  The patient returns today for routine follow-up. The patient tolerated radiotherapy well with mild diarrhea. She has follow up with Dr. Marcello Moores on 06/04/17 and will see Dr. Burr Medico at the end of the month.                                On review of systems, the patient states she is feeling well overall, she does have occasionally some bright red blood per rectum and she strength on the toilet. She states this is happening once we get most. She states that she has not had any pain in the last 2 weeks when having a bowel movement, and denies any extremity edema. She is not experiencing any fevers or chills. No other complaints or verbalized.   ALLERGIES:  is allergic to ibuprofen.  Meds: Current Outpatient Prescriptions  Medication Sig Dispense Refill  . albuterol (PROVENTIL HFA;VENTOLIN HFA) 108 (90 Base) MCG/ACT inhaler Inhale 1-2 puffs into the lungs every 4 (four) hours as needed for wheezing. 2 Inhaler 11  . amLODipine (NORVASC) 5 MG tablet Take 1 tablet (5 mg total) by mouth daily. 90 tablet 3  . amphetamine-dextroamphetamine (ADDERALL) 10 MG tablet Take 1 tablet (10 mg total) by mouth 2 (two) times daily. 60 tablet 0  . atorvastatin (LIPITOR) 80 MG tablet Take 0.5 tablets (40 mg total) by mouth daily at 6 PM. 45 tablet 3  . cholecalciferol (VITAMIN D) 400 units TABS tablet Take 400 Units by mouth daily.    Marland Kitchen losartan-hydrochlorothiazide (HYZAAR) 50-12.5 MG tablet Start with one half tablet daily for 1 week, then 1 full tablet daily (Patient taking  differently: Take 1 tablet by mouth daily. Start with one half tablet daily for 1 week, then 1 full tablet daily) 90 tablet 3  . sertraline (ZOLOFT) 50 MG tablet Take 1 tablet (50 mg total) by mouth daily. 90 tablet 3  . pramoxine (PROCTOFOAM) 1 % foam Place 1 application rectally 3 (three) times daily as needed for anal itching or anal irritation (pain). (Patient not taking: Reported on 05/23/2017) 15 g 1   Current Facility-Administered Medications  Medication Dose Route Frequency Provider Last Rate Last Dose  . 0.9 %  sodium chloride infusion  500 mL Intravenous Continuous [name], MD        Physical Findings:  height is 5\' 2"  (1.575 m) and weight is 130 lb 3.2 oz (59.1 kg). Her oral temperature is 98 F (36.7 C). Her blood pressure is 121/84 and her pulse is 95. Her respiration is 18 and oxygen saturation is 98%.  Pain Assessment Pain Score: 0-No pain/10 In general this is a well appearing Caucasian female in no acute distress. She's alert and oriented x4 and appropriate throughout the examination. Cardiopulmonary assessment is negative for acute distress and she exhibits normal effort.   Lab Findings: Lab Results  Component Value Date   WBC 2.0 (L) 05/14/2017   HGB 8.4 (L) 05/14/2017   HCT  23.8 (L) 05/14/2017   MCV 98.0 05/14/2017   PLT 404 (H) 05/14/2017     Radiographic Findings: Dg Chest 2 View  Result Date: 05/03/2017 CLINICAL DATA:  Cough over the last week. Shortness of breath and chest tightness. EXAM: CHEST  2 VIEW COMPARISON:  04/27/2017.  02/19/2017. FINDINGS: Heart size is normal. Ordinary aortic atherosclerosis is noted. The pulmonary vascularity is normal. There is mild atelectasis and or infiltrate in the left lower lobe. No effusions. The remainder the chest is clear. No acute bone finding. IMPRESSION: Density at the left base consistent with atelectasis and/or pneumonia. Electronically Signed   By: Nelson Chimes M.D.   On: 05/03/2017 13:12   Dg Chest 2  View  Result Date: 04/27/2017 CLINICAL DATA:  Cough beginning 3 days ago. EXAM: CHEST  2 VIEW COMPARISON:  CT 02/19/2017 FINDINGS: Heart size is normal. Mediastinal shadows are normal. Lungs are clear. No effusions. No significant bone finding. IMPRESSION: No active cardiopulmonary disease. Electronically Signed   By: Nelson Chimes M.D.   On: 04/27/2017 11:45    Impression/Plan: 1. Stage IIIA, cT2N1bM0 squamous cell carcinoma of the anus. The patient appears to be doing well since completion of radiotherapy. She continues to follow up with Dr. Burr Medico, and did have some difficulty with pancytopenia during treatment and thus will be having repeat blood work. We will continue to follow her expectantly regarding this. I discussed the guidelines for follow-up and surveillance, and she will be seeing Dr. Burr Medico and Dr. Marcello Moores for posttreatment surveillance. I've let her know that I would like to see her as well along throughout her course in surveillance and would plan to see her back in 6 months time, and if everything is going well at that point once a year. I'll discuss the importance of gynecologic care as it relates to HPV related disease, she has no history of abnormal Pap smears. She's had a hysterectomy and does not need Pap smear screening however does need to have annual external and bimanual examination. We discussed the importance of using vaginal dilators she is not sexually active in order to be able to formally examine her. She states agreement and understanding, dilators as lubricant were provided. She's encouraged to call she has questions or concerns regarding this.     Carola Rhine, PAC

## 2017-05-23 NOTE — Addendum Note (Signed)
Encounter addended by: Malena Edman, RN on: 05/23/2017  2:51 PM<BR>    Actions taken: Charge Capture section accepted

## 2017-06-04 DIAGNOSIS — C211 Malignant neoplasm of anal canal: Secondary | ICD-10-CM | POA: Diagnosis not present

## 2017-06-10 ENCOUNTER — Telehealth: Payer: Self-pay | Admitting: Hematology

## 2017-06-10 NOTE — Telephone Encounter (Signed)
Left message for patient regarding the changes that happened to her schedule per 10/29 sch msg.

## 2017-06-11 ENCOUNTER — Ambulatory Visit (HOSPITAL_BASED_OUTPATIENT_CLINIC_OR_DEPARTMENT_OTHER): Payer: Medicare Other | Admitting: Nurse Practitioner

## 2017-06-11 ENCOUNTER — Other Ambulatory Visit (HOSPITAL_BASED_OUTPATIENT_CLINIC_OR_DEPARTMENT_OTHER): Payer: Medicare Other

## 2017-06-11 ENCOUNTER — Encounter: Payer: Self-pay | Admitting: Nurse Practitioner

## 2017-06-11 ENCOUNTER — Telehealth: Payer: Self-pay | Admitting: Nurse Practitioner

## 2017-06-11 VITALS — BP 132/66 | HR 100 | Temp 98.1°F | Resp 20 | Ht 62.0 in | Wt 133.5 lb

## 2017-06-11 DIAGNOSIS — D649 Anemia, unspecified: Secondary | ICD-10-CM

## 2017-06-11 DIAGNOSIS — F329 Major depressive disorder, single episode, unspecified: Secondary | ICD-10-CM

## 2017-06-11 DIAGNOSIS — R5383 Other fatigue: Secondary | ICD-10-CM | POA: Diagnosis not present

## 2017-06-11 DIAGNOSIS — Z853 Personal history of malignant neoplasm of breast: Secondary | ICD-10-CM

## 2017-06-11 DIAGNOSIS — C20 Malignant neoplasm of rectum: Secondary | ICD-10-CM

## 2017-06-11 DIAGNOSIS — D701 Agranulocytosis secondary to cancer chemotherapy: Secondary | ICD-10-CM | POA: Diagnosis not present

## 2017-06-11 DIAGNOSIS — I10 Essential (primary) hypertension: Secondary | ICD-10-CM

## 2017-06-11 LAB — COMPREHENSIVE METABOLIC PANEL
ALT: 12 U/L (ref 0–55)
ANION GAP: 8 meq/L (ref 3–11)
AST: 14 U/L (ref 5–34)
Albumin: 3.7 g/dL (ref 3.5–5.0)
Alkaline Phosphatase: 47 U/L (ref 40–150)
BILIRUBIN TOTAL: 0.3 mg/dL (ref 0.20–1.20)
BUN: 20.5 mg/dL (ref 7.0–26.0)
CALCIUM: 9.7 mg/dL (ref 8.4–10.4)
CO2: 27 mEq/L (ref 22–29)
CREATININE: 0.8 mg/dL (ref 0.6–1.1)
Chloride: 105 mEq/L (ref 98–109)
EGFR: >60 mL/min/{1.73_m2} (ref >60)
Glucose: 84 mg/dl (ref 70–140)
Potassium: 3.8 mEq/L (ref 3.5–5.1)
Sodium: 139 mEq/L (ref 136–145)
TOTAL PROTEIN: 6.8 g/dL (ref 6.4–8.3)

## 2017-06-11 LAB — CBC WITH DIFFERENTIAL/PLATELET
BASO%: 2.4 % — AB (ref 0.0–2.0)
Basophils Absolute: 0.1 10*3/uL (ref 0.0–0.1)
EOS%: 25.8 % — AB (ref 0.0–7.0)
Eosinophils Absolute: 0.6 10*3/uL — ABNORMAL HIGH (ref 0.0–0.5)
HEMATOCRIT: 34.2 % — AB (ref 34.8–46.6)
HEMOGLOBIN: 11.6 g/dL (ref 11.6–15.9)
LYMPH#: 0.3 10*3/uL — AB (ref 0.9–3.3)
LYMPH%: 14 % (ref 14.0–49.7)
MCH: 33.6 pg (ref 25.1–34.0)
MCHC: 34.1 g/dL (ref 31.5–36.0)
MCV: 98.6 fL (ref 79.5–101.0)
MONO#: 0.4 10*3/uL (ref 0.1–0.9)
MONO%: 16.2 % — ABNORMAL HIGH (ref 0.0–14.0)
NEUT%: 41.6 % (ref 38.4–76.8)
NEUTROS ABS: 1 10*3/uL — AB (ref 1.5–6.5)
PLATELETS: 279 10*3/uL (ref 145–400)
RBC: 3.47 10*6/uL — ABNORMAL LOW (ref 3.70–5.45)
RDW: 16.8 % — AB (ref 11.2–14.5)
WBC: 2.4 10*3/uL — AB (ref 3.9–10.3)

## 2017-06-11 NOTE — Progress Notes (Signed)
[place]  Telephone:(336) [phone] Fax:(336) [phone]  Clinic Follow up Note   Patient Care Team: [name], DO as PCP - General (Family Medicine) Silverio Decamp, MD as Consulting Physician (Family Medicine) 06/11/2017  CC: follow up rectal cancer  SUMMARY OF ONCOLOGIC HISTORY:   Squamous cell carcinoma of rectum ([place])   02/11/2017 Procedure    Colonoscopy 02/11/17 IMPRESSION - One 12 to 15 mm polyp in the rectum (vs proximal anus). Biopsied. Firm, - just inside anal verge or on it? - One 2 mm polyp in the descending colon, removed with a cold biopsy forceps. Resected and retrieved. - The examination was otherwise normal on direct and retroflexion views.       02/11/2017 Pathology Results    Diagnosis 02/11/17 1. Colon, biopsy, descending, polyp - TUBULAR ADENOMA (ONE FRAGMENT). - NO HIGH GRADE DYSPLASIA OR MALIGNANCY. 2. Colon, polyp(s), rectum, polyp - biopsy only - SQUAMOUS CELL CARCINOMA, BASALOID-TYPE. - SEE MICROSCOPIC DESCRIPTION. Microscopic Comment 2. There are multiple fragments of rectal mucosa which are involved by poorly differentiated squamous cell carcinoma with basaloid features.      02/14/2017 Initial Diagnosis    Squamous cell carcinoma of rectum ([place])     02/20/2017 Imaging    CT CAP W Contrast 02/20/17 IMPRESSION: 1. Abnormal right pelvic sidewall lymph node measuring 1.9 x 1.6 cm. Imaging features highly suspicious for metastatic disease. PET-CT may prove helpful to further evaluate. Borderline lymph node identified at the right iliac bifurcation and an upper normal size lymph node is identified along the left external iliac artery chain. 2. Asymmetric wall thickening distal rectum, presumably the location of primary neoplasm. 3. Asymmetric airspace opacity in the left apex has a wedge shaped configuration on coronal imaging. This may be an area of scarring or potentially infectious/inflammatory etiology. Attention on  follow-up recommended. 4. Tiny right perifissural nodule in the right middle lobe, likely subpleural lymph node but attention on follow-up recommended. 5. Multiple low-density well-defined liver lesions, most suggestive of cysts. 6.  Aortic Atherosclerois (ICD10-170.0)      02/25/2017 PET scan    PET 02/25/17 IMPRESSION: 1. Focal accentuated activity in the anus as detailed above, with bilateral external iliac hypermetabolic adenopathy indicating metastatic disease. No hypermetabolic lesion of the liver or other findings of metastatic disease. 2. The tiny nodule adjacent to the minor fissure does not demonstrate hypermetabolic activity but is below sensitive PET-CT size thresholds. This may warrant surveillance. 3. Other imaging findings of potential clinical significance: Aortic Atherosclerosis (ICD10-I70.0). Coronary atherosclerosis. Asymmetric right degenerative glenohumeral arthropathy. Small calcified thyroid nodules are present but not hypermetabolic.      03/11/2017 - 04/25/2017 Radiation Therapy    Concurrent Chemo Radiation with Dr. Lisbeth Renshaw      03/11/2017 - 04/25/2017 Chemotherapy    Concurrent chemotherapoy and Radiation with mitomycin C on Day 1 and 29 and 5-fluorouracil on day 1-5, 29-33, will no longer include mitomycin starting with cycle 2.       CURRENT THERAPY:  Surveillance     INTERVAL HISTORY: Denise Wolfe returns today for follow-up as scheduled with her daughter. She feels well today, fatigue is improving, appetite normal, weight is stable. Her cough has resolved, she denies chest pain or shortness breath. Her bowel movements are becoming more regular, 3-4 formed stools per day. Denies rectal pain or bleeding. Her skin in the radiation treatment field remains slightly dark but improving, no open sores, peeling, or breakdown. She saw Dr. Lisbeth Renshaw and Dr. Marcello Moores in follow-up last  week with good reports.  REVIEW OF SYSTEMS:   Constitutional: Denies fevers, chills or  abnormal weight loss (+) stable weight (+) mild fatigue, improving Eyes: Denies blurriness of vision Ears, nose, mouth, throat, and face: Denies mucositis or sore throat Respiratory: Denies cough, dyspnea or wheezes Cardiovascular: Denies palpitation, chest discomfort or lower extremity swelling Gastrointestinal:  Denies nausea, vomiting, constipation, diarrhea, heartburn or change in bowel habits (+) 3-4 BM per day, no blood or pain Skin: Denies abnormal skin rashes (+) hyperpigmentation to pelvis and perineum, improving, no sores, ulceration, or breakdown. Lymphatics: Denies new lymphadenopathy or easy bruising Neurological:Denies numbness, tingling or new weaknesses Behavioral/Psych: Mood is stable, no new changes  All other systems were reviewed with the patient and are negative.  MEDICAL HISTORY:  Past Medical History:  Diagnosis Date  . Anemia    during childhood  . Arthritis    hands, shoulder  . Breast cancer ([place]) 03/2000   left side modified mastecomy  . Cancer [facility])    breast, age 72; left side  . Colon cancer ([place]) 02/11/2017   rectum-squamous cell carcinoma  . Depression   . Hypertension   . Squamous cell carcinoma of rectum ([place]) 02/14/2017    SURGICAL HISTORY: Past Surgical History:  Procedure Laterality Date  . ABDOMINAL HYSTERECTOMY     With bilateral oophorectomy. 2002.  Marland Kitchen BREAST BIOPSY Right 2004   benign  . BREAST SURGERY Left 2001   biopsy  . CATARACT EXTRACTION Bilateral 2017  . IR FLUORO GUIDE CV LINE RIGHT  03/11/2017  . IR FLUORO GUIDE CV LINE RIGHT  04/16/2017  . IR US GUIDE VASC ACCESS RIGHT  03/11/2017  . IR US GUIDE VASC ACCESS RIGHT  04/16/2017  . MASTECTOMY MODIFIED RADICAL Left 2001  . MENISCUS DEBRIDEMENT Right 2003  . TUMOR EXCISION Right    thigh area    I have reviewed the social history and family history with the patient and they are unchanged from previous note.  ALLERGIES:  is allergic to ibuprofen.  MEDICATIONS:  Current  Outpatient Prescriptions  Medication Sig Dispense Refill  . albuterol (PROVENTIL HFA;VENTOLIN HFA) 108 (90 Base) MCG/ACT inhaler Inhale 1-2 puffs into the lungs every 4 (four) hours as needed for wheezing. 2 Inhaler 11  . amLODipine (NORVASC) 5 MG tablet Take 1 tablet (5 mg total) by mouth daily. 90 tablet 3  . amphetamine-dextroamphetamine (ADDERALL) 10 MG tablet Take 1 tablet (10 mg total) by mouth 2 (two) times daily. 60 tablet 0  . atorvastatin (LIPITOR) 80 MG tablet Take 0.5 tablets (40 mg total) by mouth daily at 6 PM. 45 tablet 3  . cholecalciferol (VITAMIN D) 400 units TABS tablet Take 400 Units by mouth daily.    Marland Kitchen losartan-hydrochlorothiazide (HYZAAR) 50-12.5 MG tablet Start with one half tablet daily for 1 week, then 1 full tablet daily (Patient taking differently: Take 1 tablet by mouth daily. Start with one half tablet daily for 1 week, then 1 full tablet daily) 90 tablet 3  . sertraline (ZOLOFT) 50 MG tablet Take 1 tablet (50 mg total) by mouth daily. 90 tablet 3  . pramoxine (PROCTOFOAM) 1 % foam Place 1 application rectally 3 (three) times daily as needed for anal itching or anal irritation (pain). (Patient not taking: Reported on 05/23/2017) 15 g 1   Current Facility-Administered Medications  Medication Dose Route Frequency Provider Last Rate Last Dose  . 0.9 %  sodium chloride infusion  500 mL Intravenous Continuous [name], MD  PHYSICAL EXAMINATION: ECOG PERFORMANCE STATUS: 1 - Symptomatic but completely ambulatory  Vitals:   06/11/17 0930  BP: 132/66  Pulse: 100  Resp: 20  Temp: 98.1 F (36.7 C)  SpO2: 99%   Filed Weights   06/11/17 0930  Weight: 133 lb 8 oz (60.6 kg)    GENERAL:alert, no distress and comfortable SKIN: skin color, texture, turgor are normal, no rashes or significant lesions EYES: normal, Conjunctiva are pink and non-injected, sclera clear OROPHARYNX:no exudate, no erythema and lips, buccal mucosa, and tongue normal  NECK: supple,  thyroid normal size, non-tender, without nodularity LYMPH:  no palpable cervical, supraclavicular, axillary, or inguinal lymphadenopathy LUNGS: clear to auscultation bilaterally with normal breathing effort HEART: regular rate & rhythm and no murmurs and no lower extremity edema ABDOMEN:abdomen soft, non-tender and normal bowel sounds. No palpable hepatomegaly or masses Musculoskeletal:no cyanosis of digits and no clubbing  NEURO: alert & oriented x 3 with fluent speech, no focal motor/sensory deficits Rectal: external exam reveals mildly hyperpigmented intact skin, hemorrhoids. Internal exam reveals no palpable abnormality or mass that I could appreciate, no blood.   LABORATORY DATA:  I have reviewed the data as listed CBC Latest Ref Rng & Units 06/11/2017 05/14/2017 05/03/2017  WBC 3.9 - 10.3 10e3/uL 2.4(L) 2.0(L) 35.2(H)  Hemoglobin 11.6 - 15.9 g/dL 11.6 8.4(L) 9.4(L)  Hematocrit 34.8 - 46.6 % 34.2(L) 23.8(L) 27.1(L)  Platelets 145 - 400 10e3/uL 279 404(H) 162     CMP Latest Ref Rng & Units 06/11/2017 05/14/2017 05/03/2017  Glucose 70 - 140 mg/dl 84 92 98  BUN 7.0 - 26.0 mg/dL 20.5 24.3 14.2  Creatinine 0.6 - 1.1 mg/dL 0.8 0.8 0.8  Sodium 136 - 145 mEq/L 139 141 139  Potassium 3.5 - 5.1 mEq/L 3.8 3.5 4.0  Chloride 101 - 111 mmol/L - - -  CO2 22 - 29 mEq/L 27 25 27   Calcium 8.4 - 10.4 mg/dL 9.7 9.4 9.3  Total Protein 6.4 - 8.3 g/dL 6.8 6.1(L) 6.3(L)  Total Bilirubin 0.20 - 1.20 mg/dL 0.30 0.26 0.35  Alkaline Phos 40 - 150 U/L 47 54 138  AST 5 - 34 U/L 14 15 24   ALT 0 - 55 U/L 12 18 11     RADIOGRAPHIC STUDIES: I have personally reviewed the radiological images as listed and agreed with the findings in the report. No results found.   ASSESSMENT & PLAN: Denise Wolfe is here for a follow up of rectal SCC. She has a past medical history of breast cancer in 2001, Anemia, Arthritis and HTN.   1. Squamous Cell Carcinoma of Rectum, TxN2M0 2. Fatigue and dyspnea 3. Hx of left breast  cancer, stage 3 4. HTN 5. Depression 6. Hypercholesterolemia 7. Anemia and neutropenia 8. Dry cough  Denise Wolfe is much improved from her last visit. Her cough has resolved. Her fatigue, skin change, and BM continue to return to normal. Rectal exam reveals no palpable mass, no clinical concern for disease. I have ordered a PET scan for 2 months from now to evaluate her response to therapy and reevaluate bilateral external iliac hypermetabolic adenopathy seen on 02/25/2017 initial PET scan. VS and weight stable, Cmet unremarkable, CBC continues to indicate neutropenia, ANC unchanged from 1 month ago, 1.0k today; hgb responded to RBC infusion, now 11.6. She had prolonged neutropenia secondary to chemotherapy. She received 5FU only with cycle 2. Will continue to monitor. She declined a flu vaccine today, will get one from her PCP at next visit in 1 week. She saw  Dr. Marcello Moores and Dr. Lisbeth Renshaw last week. She will return to our clinic for lab and f/u in 1 month, PET to be done after that visit.   PLAN: -PET scan in 2 months, ordered today -return in 4 weeks with lab and f/u   Orders Placed This Encounter  Procedures  . NM PET Image Restag (PS) Skull Base To Thigh    Standing Status:   Future    Standing Expiration Date:   06/11/2018    Order Specific Question:   If indicated for the ordered procedure, I authorize the administration of a radiopharmaceutical per Radiology protocol    Answer:   Yes    Order Specific Question:   Preferred imaging location?    Answer:   [facility]    Order Specific Question:   Radiology Contrast Protocol - do NOT remove file path    Answer:   [email or web address]    Order Specific Question:   Reason for Exam additional comments    Answer:   rectal cancer s/p chemoradiation, evaluate response to therapy   All questions were answered. The patient knows to call the clinic with any problems, questions or concerns. No barriers to learning was  detected. I spent 20 minutes counseling the patient face to face. The total time spent in the appointment was 25 minutes and more than 50% was on counseling and review of test results     Alla Feeling, NP 06/11/17

## 2017-06-11 NOTE — Telephone Encounter (Signed)
Scheduled appt per 10/30 los - gave patient AVS and calender per los. - per patient request pushed appt out one week due to traveling for the holidays

## 2017-06-27 ENCOUNTER — Ambulatory Visit (INDEPENDENT_AMBULATORY_CARE_PROVIDER_SITE_OTHER): Payer: Medicare Other | Admitting: Family Medicine

## 2017-06-27 ENCOUNTER — Encounter: Payer: Self-pay | Admitting: Family Medicine

## 2017-06-27 VITALS — BP 129/82 | HR 94 | Ht 62.0 in | Wt 134.9 lb

## 2017-06-27 DIAGNOSIS — F329 Major depressive disorder, single episode, unspecified: Secondary | ICD-10-CM

## 2017-06-27 DIAGNOSIS — E559 Vitamin D deficiency, unspecified: Secondary | ICD-10-CM

## 2017-06-27 DIAGNOSIS — C20 Malignant neoplasm of rectum: Secondary | ICD-10-CM

## 2017-06-27 DIAGNOSIS — E782 Mixed hyperlipidemia: Secondary | ICD-10-CM

## 2017-06-27 DIAGNOSIS — C801 Malignant (primary) neoplasm, unspecified: Secondary | ICD-10-CM

## 2017-06-27 DIAGNOSIS — F32A Depression, unspecified: Secondary | ICD-10-CM

## 2017-06-27 DIAGNOSIS — T451X5A Adverse effect of antineoplastic and immunosuppressive drugs, initial encounter: Secondary | ICD-10-CM

## 2017-06-27 DIAGNOSIS — F341 Dysthymic disorder: Secondary | ICD-10-CM

## 2017-06-27 DIAGNOSIS — I10 Essential (primary) hypertension: Secondary | ICD-10-CM

## 2017-06-27 DIAGNOSIS — F430 Acute stress reaction: Secondary | ICD-10-CM

## 2017-06-27 DIAGNOSIS — D701 Agranulocytosis secondary to cancer chemotherapy: Secondary | ICD-10-CM

## 2017-06-27 MED ORDER — AMPHETAMINE-DEXTROAMPHETAMINE 10 MG PO TABS: 10.0000 mg | ORAL_TABLET | Freq: Two times a day (BID) | ORAL | 0 refills | Status: DC

## 2017-06-27 MED ORDER — SERTRALINE HCL 50 MG PO TABS: 75.0000 mg | ORAL_TABLET | Freq: Every day | ORAL | 3 refills | Status: DC

## 2017-06-27 NOTE — Patient Instructions (Addendum)
In the near future please come in for just fasting lab appointment this will include full fasting cholesterol panel,  as well as vitamin D.  -Talked to patient about importance of not just taking medications but a prudent diet, appropriate sleep, going to a counselor, getting adequate exercise of 150 minutes moderate intensity exercise per week and also meditation and/or prayer or deep breathing etc.  All of these things are important in the management of stress and happiness.   -Also we need to transition your brain into thinking more positively.  These tasks below are some things I want you to do every day 1)  write 3 new things that you are grateful for every day for 21 days  2)  exercise daily- walk for 15 minutes twice a day every day 3)  you are going to journal every day about one positive experience that you had 4)  meditate every day.  You can go on YouTube and look for 15-minute relaxation meditation or what ever.  But we need to make sure that you are in the moment and relaxing and deep breathing every day 5)  Write 1 positive email every day to praise someone in your life    - If you have insomnia or difficulty sleeping, this information is for you:  - Avoid caffeinated beverages after lunch,  no alcoholic beverages,  no eating within 2-3 hours of lying down,  avoid exposure to blue light before bed,  avoid daytime naps, and  needs to maintain a regular sleep schedule- go to sleep and wake up around the same time every night.   - Resolve concerns or worries before entering bedroom:  Discussed relaxation techniques with patient and to keep a journal to write down fears\ worries.  I suggested seeing a counselor for CBT.   - Recommend patient meditate or do deep breathing exercises to help relax.   Incorporate the use of white noise machines or listen to "sleep meditation music", or recordings of guided meditations for sleep from YouTube which are free, such as  "guided meditation  for detachment from over thinking"  by Mayford Knife.     Behavioral Health/ Counseling Referrals    Dr. Tomi Bamberger, PHD Dr. Tomi Bamberger, PHD is a counselor in [place], [place].  [address] [place], [place] [address] [place] [phone]   Kristie Cowman, [place]  33 [phone] [email or web address] ( she does [place] and faith-based coaching and counseling )   [employer]- ( faith-based counseling ) Address: [address] [place]. [place], [place] [address] [phone] [address] for appointments [phone] Fax Hours: Monday - Thursday 8:00am-6:00pm Closed for lunch 12-1Thursday only Friday: Closed all day   [facility] Nunzio Cobbs, LCSW, ACSW, M.ED.  -Nunzio Cobbs is a licensed clinical social worker in practice over 35 years and with Dr. Chucky May for the last 10 years.  -She sees adults, adolescents, children & families and couples. -Services are provided for mood and anxiety disorders, marital issues, family or parent/child problems, parenting, co-dependency, gender issues, trauma, grief, and stages of life issues. She also provides critical incident stress debriefing.  -Pamala Hurry accepts many employee assistance programs (EAP), [employer], Pharmacist, hospital.  PHONE  [phone]                FAX [phone]   Rodena [email or web address] [employer]- gen counseling;  PHD   Wilber Oliphant, MSW [address] [place]  [facility] Apolonio Schneiders, PhD [address], [facility] [phone]   [place] and Psychological- children [address], [place], [place] [phone]   Lanelle Bal Professional Counselor Counseling and [employer] [place] abuse [facility] Manager [address] [place] [phone] [place] [address] [place] Delmer Islam, PhD [place], PhD Leitha Bleak, LCSW Jillene Bucks, PhD-child, adolescent and adults   [facility] [address], [place] [phone] Merilyn Baba, MS-child, adolescent and adults Lennart Pall, PhD-adolescent and adults   [facility]-grief, terminal illness [place], [place] [address], [facility] [address], [place], [place]   [facility] [address] [place], [place] [phone]   [facility] [address] [place] [phone]   [facility] [address], [place]   [facility] [address], [place] [address], [place]

## 2017-06-27 NOTE — Progress Notes (Signed)
Impression and Recommendations:    1. Depression, unspecified depression type   2. Dysthymia   3. Squamous cell carcinoma of rectum (HCC)   4. HTN (hypertension), benign   5. Mixed hyperlipidemia   6. History of breast Cancer   7. Vitamin D insufficiency   8. Mixed disorder as reaction to stress   9. Chemotherapy induced neutropenia (HCC)     There are no diagnoses linked to this encounter.  There are no diagnoses linked to this encounter.  No problem-specific Assessment & Plan notes found for this encounter.   Education and routine counseling performed. Handouts provided.  Orders Placed This Encounter  Procedures   VITAMIN D 25 Hydroxy (Vit-D Deficiency, Fractures)   Lipid panel   Basic metabolic panel   CBC with Differential/Platelet     Gross side effects, risk and benefits, and alternatives of medications and treatment plan in general discussed with patient.  Patient is aware that all medications have potential side effects and we are unable to predict every side effect or drug-drug interaction that may occur.   Patient will call with any questions prior to using medication if they have concerns.  Expresses verbal understanding and consents to current therapy and treatment regimen.  No barriers to understanding were identified.  Red flag symptoms and signs discussed in detail.  Patient expressed understanding regarding what to do in case of emergency\urgent symptoms  Please see AVS handed out to patient at the end of our visit for further patient instructions/ counseling done pertaining to today's office visit.   Return in about 4 months (around 10/25/2017) for near future- FBW, and OV w me 17mo.    Note:  This note was prepared with assistance of Dragon voice recognition software. Occasional wrong-word or sound-a-like substitutions may have occurred due to the inherent limitations of voice recognition software.  [name] 11:27  AM --------------------------------------------------------------------------------------------------------------------------------------------------------------------------------------------------------------------------------------------    Subjective:    CC:  Chief Complaint  Patient presents with   Follow-up    HPI: Denise Wolfe is a 76 y.o. female who presents to [facility] at [facility] today for issues as discussed below.  Mood- ok, doesn't feel liek herself again yet but better  - still thinks not as focused as she would like  - still lacking energy level she had previous.   - when he husband was ill in past- she was up on higher dose of xoloft- up to 100mg  at one time.   CHOL HPI:   -  She  is currently managed with:  See med list from today  Patient reports very little compliance with low chol/ saturated and trans fat diet.  No exercise  No other s-e  Last lipid panel as follows:  Lab Results  Component Value Date   CHOL 272 (H) 07/11/2016   HDL 95 07/11/2016   LDLCALC 167 (H) 07/11/2016   TRIG 51 07/11/2016   CHOLHDL 2.9 07/11/2016    Hepatic Function Latest Ref Rng & Units 06/11/2017 05/14/2017 05/03/2017  Total Protein 6.4 - 8.3 g/dL 6.8 6.1(L) 6.3(L)  Albumin 3.5 - 5.0 g/dL 3.7 3.3(L) 3.5  AST 5 - 34 U/L 14 15 24   ALT 0 - 55 U/L 12 18 11   Alk Phosphatase 40 - 150 U/L 47 54 138  Total Bilirubin 0.20 - 1.20 mg/dL 6.43 3.29 5.18     Wt Readings from Last 3 Encounters:  06/27/17 134 lb 14.4 oz (61.2 kg)  06/11/17 133 lb  8 oz (60.6 kg)  05/23/17 130 lb 3.2 oz (59.1 kg)   BP Readings from Last 3 Encounters:  06/27/17 129/82  06/11/17 132/66  05/23/17 121/84   Pulse Readings from Last 3 Encounters:  06/27/17 94  06/11/17 100  05/23/17 95   BMI Readings from Last 3 Encounters:  06/27/17 24.67 kg/m  06/11/17 24.42 kg/m  05/23/17 23.81 kg/m     Patient Care Team    Relationship Specialty Notifications Start End  Thomasene Lot, DO PCP - General Family Medicine  05/10/16   Monica Becton, MD Consulting Physician Family Medicine  07/25/16    Comment: OA - shoulder injeciton     Patient Active Problem List   Diagnosis Date Noted   Squamous cell carcinoma of rectum (HCC) 02/14/2017    Priority: High   HLD (hyperlipidemia) 07/19/2016    Priority: High   Elevated HDL:  >er 90 07/19/2016    Priority: High   History of breast Cancer 05/10/2016    Priority: High   HTN (hypertension), benign 05/10/2016    Priority: High   Mixed disorder as reaction to stress 08/08/2016    Priority: Medium   Depression 05/10/2016    Priority: Medium   Chemotherapy induced neutropenia (HCC) 04/14/2017    Priority: Low   Vitamin D insufficiency 07/19/2016    Priority: Low   Degenerative joint disease (DJD) of sternoclavicular joint, right 07/19/2016    Priority: Low   Pain in joint, shoulder region 05/10/2016    Priority: Low   Anemia due to antineoplastic chemotherapy 05/14/2017   Pneumonia of left lower lobe due to infectious organism (HCC) 05/09/2017   Difficulty sleeping 05/09/2017   Burning sensation- scalp esp at night 05/09/2017   Wheeze 05/09/2017   Attention deficit disorder 12/27/2016   Multiple atypical nevi 12/27/2016   Nonspecific chest pain 08/08/2016   Retrosternal chest pain 07/19/2016   Cataract 05/10/2016    Past Medical history, Surgical history, Family history, Social history, Allergies and Medications have been entered into the medical record, reviewed and changed as needed.    Current Meds  Medication Sig   albuterol (PROVENTIL HFA;VENTOLIN HFA) 108 (90 Base) MCG/ACT inhaler Inhale 1-2 puffs into the lungs every 4 (four) hours as needed for wheezing.   amLODipine (NORVASC) 5 MG tablet Take 1 tablet (5 mg total) by mouth daily.   amphetamine-dextroamphetamine (ADDERALL) 10 MG tablet Take 1 tablet (10 mg total) 2 (two) times daily by mouth.   atorvastatin (LIPITOR) 80 MG tablet Take  0.5 tablets (40 mg total) by mouth daily at 6 PM.   cholecalciferol (VITAMIN D) 400 units TABS tablet Take 400 Units by mouth daily.   losartan-hydrochlorothiazide (HYZAAR) 50-12.5 MG tablet Start with one half tablet daily for 1 week, then 1 full tablet daily (Patient taking differently: Take 1 tablet by mouth daily. Start with one half tablet daily for 1 week, then 1 full tablet daily)   pramoxine (PROCTOFOAM) 1 % foam Place 1 application rectally 3 (three) times daily as needed for anal itching or anal irritation (pain).   sertraline (ZOLOFT) 50 MG tablet Take 1.5 tablets (75 mg total) daily by mouth.   [DISCONTINUED] amphetamine-dextroamphetamine (ADDERALL) 10 MG tablet Take 1 tablet (10 mg total) by mouth 2 (two) times daily.   [DISCONTINUED] sertraline (ZOLOFT) 50 MG tablet Take 1 tablet (50 mg total) by mouth daily.   Current Facility-Administered Medications for the 06/27/17 encounter (Office Visit) with Thomasene Lot, DO  Medication   0.9 %  sodium chloride infusion    Allergies:  Allergies  Allergen Reactions   Ibuprofen Rash     Review of Systems: General:   Denies fever, chills, unexplained weight loss.  Optho/Auditory:   Denies visual changes, blurred vision/LOV Respiratory:   Denies wheeze, DOE more than baseline levels.   Cardiovascular:   Denies chest pain, palpitations, new onset peripheral edema  Gastrointestinal:   Denies nausea, vomiting, diarrhea, abd pain.  Genitourinary: Denies dysuria, freq/ urgency, flank pain or discharge from genitals.  Endocrine:     Denies hot or cold intolerance, polyuria, polydipsia. Musculoskeletal:   Denies unexplained myalgias, joint swelling, unexplained arthralgias, gait problems.  Skin:  Denies new onset rash, suspicious lesions Neurological:     Denies dizziness, unexplained weakness, numbness  Psychiatric/Behavioral:   Denies mood changes, suicidal or homicidal ideations, hallucinations    Objective:   Blood pressure  129/82, pulse 94, height 5\' 2"  (1.575 m), weight 134 lb 14.4 oz (61.2 kg). Body mass index is 24.67 kg/m. General:  Well Developed, well nourished, appropriate for stated age.  Neuro:  Alert and oriented,  extra-ocular muscles intact  HEENT:  Normocephalic, atraumatic, neck supple, no carotid bruits appreciated  Skin:  no gross rash, warm, pink. Cardiac:  RRR, S1 S2 Respiratory:  ECTA B/L and A/P, Not using accessory muscles, speaking in full sentences- unlabored. Vascular:  Ext warm, no cyanosis apprec.; cap RF less 2 sec. Psych:  No HI/SI, judgement and insight good, Euthymic mood. Full Affect.

## 2017-07-11 ENCOUNTER — Other Ambulatory Visit: Payer: Medicare Other

## 2017-07-11 DIAGNOSIS — E559 Vitamin D deficiency, unspecified: Secondary | ICD-10-CM | POA: Diagnosis not present

## 2017-07-11 DIAGNOSIS — D701 Agranulocytosis secondary to cancer chemotherapy: Secondary | ICD-10-CM | POA: Diagnosis not present

## 2017-07-11 DIAGNOSIS — T451X5A Adverse effect of antineoplastic and immunosuppressive drugs, initial encounter: Secondary | ICD-10-CM | POA: Diagnosis not present

## 2017-07-11 DIAGNOSIS — E782 Mixed hyperlipidemia: Secondary | ICD-10-CM | POA: Diagnosis not present

## 2017-07-11 DIAGNOSIS — I10 Essential (primary) hypertension: Secondary | ICD-10-CM

## 2017-07-12 LAB — CBC WITH DIFFERENTIAL/PLATELET
BASOS ABS: 0.1 10*3/uL (ref 0.0–0.2)
BASOS: 2 %
EOS (ABSOLUTE): 0.5 10*3/uL — AB (ref 0.0–0.4)
Eos: 21 %
HEMATOCRIT: 35.5 % (ref 34.0–46.6)
Hemoglobin: 12.2 g/dL (ref 11.1–15.9)
Immature Grans (Abs): 0 10*3/uL (ref 0.0–0.1)
Immature Granulocytes: 0 %
Lymphocytes Absolute: 0.6 10*3/uL — ABNORMAL LOW (ref 0.7–3.1)
Lymphs: 26 %
MCH: 33.2 pg — AB (ref 26.6–33.0)
MCHC: 34.4 g/dL (ref 31.5–35.7)
MCV: 97 fL (ref 79–97)
MONOS ABS: 0.3 10*3/uL (ref 0.1–0.9)
Monocytes: 11 %
NEUTROS ABS: 0.9 10*3/uL — AB (ref 1.4–7.0)
Neutrophils: 40 %
PLATELETS: 332 10*3/uL (ref 150–379)
RBC: 3.67 x10E6/uL — ABNORMAL LOW (ref 3.77–5.28)
RDW: 14 % (ref 12.3–15.4)
WBC: 2.4 10*3/uL — CL (ref 3.4–10.8)

## 2017-07-12 LAB — BASIC METABOLIC PANEL
BUN/Creatinine Ratio: 30 — ABNORMAL HIGH (ref 12–28)
BUN: 23 mg/dL (ref 8–27)
CO2: 27 mmol/L (ref 20–29)
Calcium: 9.3 mg/dL (ref 8.7–10.3)
Chloride: 104 mmol/L (ref 96–106)
Creatinine, Ser: 0.77 mg/dL (ref 0.57–1.00)
GFR, EST AFRICAN AMERICAN: 88 mL/min/{1.73_m2} (ref >59)
GFR, EST NON AFRICAN AMERICAN: 76 mL/min/{1.73_m2} (ref >59)
Glucose: 100 mg/dL — ABNORMAL HIGH (ref 65–99)
POTASSIUM: 4.3 mmol/L (ref 3.5–5.2)
SODIUM: 144 mmol/L (ref 134–144)

## 2017-07-12 LAB — LIPID PANEL
CHOL/HDL RATIO: 3.1 ratio (ref 0.0–4.4)
Cholesterol, Total: 207 mg/dL — ABNORMAL HIGH (ref 100–199)
HDL: 67 mg/dL (ref >39)
LDL Calculated: 124 mg/dL — ABNORMAL HIGH (ref 0–99)
Triglycerides: 79 mg/dL (ref 0–149)
VLDL Cholesterol Cal: 16 mg/dL (ref 5–40)

## 2017-07-12 LAB — VITAMIN D 25 HYDROXY (VIT D DEFICIENCY, FRACTURES): VIT D 25 HYDROXY: 51.4 ng/mL (ref 30.0–100.0)

## 2017-07-17 ENCOUNTER — Other Ambulatory Visit: Payer: Medicare Other

## 2017-07-17 ENCOUNTER — Ambulatory Visit: Payer: Medicare Other | Admitting: Hematology

## 2017-07-18 ENCOUNTER — Telehealth: Payer: Self-pay | Admitting: Hematology

## 2017-07-18 NOTE — Telephone Encounter (Signed)
Scheduled appts per 12/5 sch msg. Left voicemail for patient regarding appts that were added.

## 2017-07-19 NOTE — Progress Notes (Signed)
[place]  [phone] [phone]  Clinic Follow up Note   Patient Care Team: [name], DO as PCP - General (Family Medicine) Silverio Decamp, MD as Consulting Physician (Family Medicine)   Date of Service: 07/23/2017    SUMMARY OF ONCOLOGIC HISTORY:   Squamous cell carcinoma of rectum ([place])   02/11/2017 Procedure    Colonoscopy 02/11/17 IMPRESSION - One 12 to 15 mm polyp in the rectum (vs proximal anus). Biopsied. Firm, - just inside anal verge or on it? - One 2 mm polyp in the descending colon, removed with a cold biopsy forceps. Resected and retrieved. - The examination was otherwise normal on direct and retroflexion views.       02/11/2017 Pathology Results    Diagnosis 02/11/17 1. Colon, biopsy, descending, polyp - TUBULAR ADENOMA (ONE FRAGMENT). - NO HIGH GRADE DYSPLASIA OR MALIGNANCY. 2. Colon, polyp(s), rectum, polyp - biopsy only - SQUAMOUS CELL CARCINOMA, BASALOID-TYPE. - SEE MICROSCOPIC DESCRIPTION. Microscopic Comment 2. There are multiple fragments of rectal mucosa which are involved by poorly differentiated squamous cell carcinoma with basaloid features.      02/14/2017 Initial Diagnosis    Squamous cell carcinoma of rectum ([place])      02/20/2017 Imaging    CT CAP W Contrast 02/20/17 IMPRESSION: 1. Abnormal right pelvic sidewall lymph node measuring 1.9 x 1.6 cm. Imaging features highly suspicious for metastatic disease. PET-CT may prove helpful to further evaluate. Borderline lymph node identified at the right iliac bifurcation and an upper normal size lymph node is identified along the left external iliac artery chain. 2. Asymmetric wall thickening distal rectum, presumably the location of primary neoplasm. 3. Asymmetric airspace opacity in the left apex has a wedge shaped configuration on coronal imaging. This may be an area of scarring or potentially infectious/inflammatory etiology. Attention on  follow-up recommended. 4. Tiny right perifissural nodule in the right middle lobe, likely subpleural lymph node but attention on follow-up recommended. 5. Multiple low-density well-defined liver lesions, most suggestive of cysts. 6.  Aortic Atherosclerois (ICD10-170.0)      02/25/2017 PET scan    PET 02/25/17 IMPRESSION: 1. Focal accentuated activity in the anus as detailed above, with bilateral external iliac hypermetabolic adenopathy indicating metastatic disease. No hypermetabolic lesion of the liver or other findings of metastatic disease. 2. The tiny nodule adjacent to the minor fissure does not demonstrate hypermetabolic activity but is below sensitive PET-CT size thresholds. This may warrant surveillance. 3. Other imaging findings of potential clinical significance: Aortic Atherosclerosis (ICD10-I70.0). Coronary atherosclerosis. Asymmetric right degenerative glenohumeral arthropathy. Small calcified thyroid nodules are present but not hypermetabolic.      03/11/2017 - 04/25/2017 Radiation Therapy    Concurrent Chemo Radiation with Dr. Lisbeth Renshaw      03/11/2017 - 04/25/2017 Chemotherapy    Concurrent chemotherapoy and Radiation with mitomycin C on Day 1 and 29 and 5-fluorouracil on day 1-5, 29-33, will no longer include mitomycin starting with cycle 2.         CURRENT THERAPY:  Surveillance     INTERVAL HISTORY:  Denise Wolfe is here for a follow up. She presents to the clinic today accompanied by her daughter.  She still has mild residual fatigue from her previous chemo and radiation, but overall has recovered well.  She is able to tolerate routine daily activities without much difficulties.  She denies any significant pain, abdominal discomfort, or other symptoms.  She denies diarrhea or constipation.  Her appetite is still slightly low, but overall  she eats well, she has gained some weight back lately.   REVIEW OF SYSTEMS:  Constitutional: Denies fevers, chills or abnormal  weight loss (+) mild fatigue  Eyes: Denies blurriness of vision Ears, nose, mouth, throat, and face: Denies mucositis or sore throat Respiratory: Denies dyspnea or wheezes or cough  Cardiovascular: Denies palpitation, chest discomfort or lower extremity swelling Gastrointestinal:  Denies nausea, heartburn or change in bowel habits (+) hemorrhoid pain, intermittent pain during BM from radiation.  Skin: Denies abnormal skin rashes  Lymphatics: Denies new lymphadenopathy or easy bruising Neurological:Denies numbness, tingling or new weaknesses Behavioral/Psych: Mood is stable, no new changes  All other systems were reviewed with the patient and are negative.   MEDICAL HISTORY:  Past Medical History:  Diagnosis Date  . Anemia    during childhood  . Arthritis    hands, shoulder  . Breast cancer ([place]) 03/2000   left side modified mastecomy  . Cancer [facility])    breast, age 88; left side  . Colon cancer ([place]) 02/11/2017   rectum-squamous cell carcinoma  . Depression   . Hypertension   . Squamous cell carcinoma of rectum ([place]) 02/14/2017    SURGICAL HISTORY: Past Surgical History:  Procedure Laterality Date  . ABDOMINAL HYSTERECTOMY     With bilateral oophorectomy. 2002.  Marland Kitchen BREAST BIOPSY Right 2004   benign  . BREAST SURGERY Left 2001   biopsy  . CATARACT EXTRACTION Bilateral 2017  . IR FLUORO GUIDE CV LINE RIGHT  03/11/2017  . IR FLUORO GUIDE CV LINE RIGHT  04/16/2017  . IR US GUIDE VASC ACCESS RIGHT  03/11/2017  . IR US GUIDE VASC ACCESS RIGHT  04/16/2017  . MASTECTOMY MODIFIED RADICAL Left 2001  . MENISCUS DEBRIDEMENT Right 2003  . TUMOR EXCISION Right    thigh area    I have reviewed the social history and family history with the patient and they are unchanged from previous note.  ALLERGIES:  is allergic to ibuprofen.  MEDICATIONS:  Current Outpatient Medications  Medication Sig Dispense Refill  . amLODipine (NORVASC) 5 MG tablet Take 1 tablet (5 mg total) by mouth daily.  90 tablet 3  . amphetamine-dextroamphetamine (ADDERALL) 10 MG tablet Take 1 tablet (10 mg total) 2 (two) times daily by mouth. 180 tablet 0  . atorvastatin (LIPITOR) 80 MG tablet Take 0.5 tablets (40 mg total) by mouth daily at 6 PM. 45 tablet 3  . cholecalciferol (VITAMIN D) 400 units TABS tablet Take 400 Units by mouth daily.    Marland Kitchen losartan-hydrochlorothiazide (HYZAAR) 50-12.5 MG tablet Start with one half tablet daily for 1 week, then 1 full tablet daily (Patient taking differently: Take 1 tablet by mouth daily. Start with one half tablet daily for 1 week, then 1 full tablet daily) 90 tablet 3  . sertraline (ZOLOFT) 50 MG tablet Take 1.5 tablets (75 mg total) daily by mouth. 135 tablet 3   Current Facility-Administered Medications  Medication Dose Route Frequency Provider Last Rate Last Dose  . 0.9 %  sodium chloride infusion  500 mL Intravenous Continuous [name], MD        PHYSICAL EXAMINATION: ECOG PERFORMANCE STATUS: 0 Vitals:   07/23/17 1036  BP: 131/68  Pulse: 85  Resp: 18  Temp: 98.4 F (36.9 C)  TempSrc: Oral  SpO2: 100%  Weight: 138 lb 4.8 oz (62.7 kg)  Height: 5\' 2"  (1.575 m)    GENERAL:alert, no distress and comfortable SKIN: skin color, texture, turgor are normal, no rashes or  significant lesions EYES: normal, Conjunctiva are pink and non-injected, sclera clear OROPHARYNX:no exudate, no erythema and lips, buccal mucosa, and tongue normal  NECK: supple, thyroid normal size, non-tender, without nodularity LYMPH:  no palpable lymphadenopathy in the cervical, axillary or inguinal LUNGS: clear to auscultation and percussion with normal breathing effort HEART: regular rate & rhythm and no murmurs and no lower extremity edema ABDOMEN:abdomen soft, non-tender and normal bowel sounds, No local megaly. Digital exam deferred today, scan showed a 1.5 cm anterior low rectum mass.  Musculoskeletal:no cyanosis of digits and no clubbing  NEURO: alert & oriented x 3 with  fluent speech, no focal motor/sensory deficits RECTAL rectal and inguinal areas skin are normal and intact, (+), and internal hemorrhoids, rectal exam showed no palpable mass or blood on glove   LABORATORY DATA:  I have reviewed the data as listed CBC Latest Ref Rng & Units 07/11/2017 06/11/2017 05/14/2017  WBC 3.4 - 10.8 x10E3/uL 2.4(LL) 2.4(L) 2.0(L)  Hemoglobin 11.1 - 15.9 g/dL 12.2 11.6 8.4(L)  Hematocrit 34.0 - 46.6 % 35.5 34.2(L) 23.8(L)  Platelets 150 - 379 x10E3/uL 332 279 404(H)     CMP Latest Ref Rng & Units 07/11/2017 06/11/2017 05/14/2017  Glucose 65 - 99 mg/dL 100(H) 84 92  BUN 8 - 27 mg/dL 23 20.5 24.3  Creatinine 0.57 - 1.00 mg/dL 0.77 0.8 0.8  Sodium 134 - 144 mmol/L 144 139 141  Potassium 3.5 - 5.2 mmol/L 4.3 3.8 3.5  Chloride 96 - 106 mmol/L 104 - -  CO2 20 - 29 mmol/L 27 27 25   Calcium 8.7 - 10.3 mg/dL 9.3 9.7 9.4  Total Protein 6.4 - 8.3 g/dL - 6.8 6.1(L)  Total Bilirubin 0.20 - 1.20 mg/dL - 0.30 0.26  Alkaline Phos 40 - 150 U/L - 47 54  AST 5 - 34 U/L - 14 15  ALT 0 - 55 U/L - 12 18   ANC 0.9K today   PATHOLOGY Diagnosis 02/11/17 1. Colon, biopsy, descending, polyp - TUBULAR ADENOMA (ONE FRAGMENT). - NO HIGH GRADE DYSPLASIA OR MALIGNANCY. 2. Colon, polyp(s), rectum, polyp - biopsy only - SQUAMOUS CELL CARCINOMA, BASALOID-TYPE. - SEE MICROSCOPIC DESCRIPTION. Microscopic Comment 2. There are multiple fragments of rectal mucosa which are involved by poorly differentiated squamous cell carcinoma with basaloid features.   PROCEDURES Colonoscopy 02/11/17 IMPRESSION - One 12 to 15 mm polyp in the rectum (vs proximal anus). Biopsied. Firm, - just inside anal verge or on it? - One 2 mm polyp in the descending colon, removed with a cold biopsy forceps. Resected and retrieved. - The examination was otherwise normal on direct and retroflexion views.   RADIOGRAPHIC STUDIES: I have personally reviewed the radiological images as listed and agreed with the  findings in the report. Nm Pet Image Restag (ps) Skull Base To Thigh  Result Date: 07/22/2017 CLINICAL DATA:  Subsequent treatment strategy for rectal cancer status post chemoradiation. EXAM: NUCLEAR MEDICINE PET SKULL BASE TO THIGH TECHNIQUE: 5.87 mCi F-18 FDG was injected intravenously. Full-ring PET imaging was performed from the skull base to thigh after the radiotracer. CT data was obtained and used for attenuation correction and anatomic localization. FASTING BLOOD GLUCOSE:  Value: 94 mg/dl COMPARISON:  PET-CT dated 02/25/2017. CT chest abdomen pelvis dated 02/19/2017. FINDINGS: NECK: No hypermetabolic cervical lymphadenopathy. CHEST: Radiation changes in the anterior left upper lobe/ left lung apex. Status post left mastectomy. Mild mosaic attenuation in the lungs bilaterally. Stable 3 mm subpleural nodule along the right minor fissure (series 8/image 31), benign. No  suspicious pulmonary nodules. No hypermetabolic thoracic lymphadenopathy. The heart is normal in size. No pericardial effusion. No evidence of thoracic aortic aneurysm. Atherosclerotic calcifications aortic arch. Three vessel coronary atherosclerosis. ABDOMEN/PELVIS: No hypermetabolic abdominopelvic lymphadenopathy. Specifically, the bilateral external iliac lymphadenopathy has resolved. Perirectal stranding with presacral radiation changes in the lower pelvis (series 4/image 159). No abnormal hypermetabolic activity in the liver, spleen, pancreas, adrenal glands, or kidneys. Stable 12 mm lesion in segment 2 (series 4/image 86), non FDG avid. Stable 4.2 cm bilobed cyst in segment 6 (series 4/ image 96). Atherosclerotic calcifications the abdominal aorta and branch vessels. Status post hysterectomy. Small hiatal hernia. SKELETON: No focal hypermetabolic activity to suggest skeletal metastasis. IMPRESSION: No findings suspicious for recurrent or metastatic disease. Prior bilateral external iliac lymphadenopathy has resolved.  Could Radiation  changes in the lower pelvis/perirectal region. Radiation changes in the left upper lobe. Electronically Signed   By: Julian Hy M.D.   On: 07/22/2017 13:11     PET 02/25/17 IMPRESSION: 1. Focal accentuated activity in the anus as detailed above, with bilateral external iliac hypermetabolic adenopathy indicating metastatic disease. No hypermetabolic lesion of the liver or other findings of metastatic disease. 2. The tiny nodule adjacent to the minor fissure does not demonstrate hypermetabolic activity but is below sensitive PET-CT size thresholds. This may warrant surveillance. 3. Other imaging findings of potential clinical significance: Aortic Atherosclerosis (ICD10-I70.0). Coronary atherosclerosis. Asymmetric right degenerative glenohumeral arthropathy. Small calcified thyroid nodules are present but not hypermetabolic.  ASSESSMENT & PLAN:  Kiylee Thoreson is here for a follow up of rectal SCC. She has a past medical history of breast cancer in 2001, Anemia, Arthritis and HTN.   1. Squamous Cell Carcinoma of Rectum, [id number] -She underwent a screening colonoscopy 02/11/2017. Findings included a 12-15 mm polyp in the rectum, semi-sessile. A 2 mm polyp was found in the descending colon. Pathology on the rectal polyp showed squamous cell carcinoma, basaloid type. The descending colon polyp showed tubular adenoma -CT scans of the chest/abdomen/pelvis 02/19/2017 showed an abnormal right pelvic sidewall lymph node measuring 1.9 x 1.6 cm -We discussed her PET from 02/25/17. The PET shows enlarged hypermetabolic bilateral external iliac lymph nodes (2), no distant metastasis.  -Patient had a question if her pelvic and inguinal metastasis from her previous breast cancer. Were discussed this is unlikely, given the distribution of the lymph nodes which is more consistent with rectal cancer metastasis. Her previous breast cancer was diagnosed 17 years ago, hormonal sensitive, and she received a  standard treatment. No evidence of recurrence since then. -Although she has locally advanced disease, this is still considered curable with concurrent chemotherapy and radiation. -She completed the recommended standard chemotherapy with 5-FU and mitomycin regimen, with concurrent radiation on 04/25/17. She tolerated well at first but developed prolonged neutropenia, day 28 chemo was postponed for one weekand she received 5-fu only for second cycle.  -She now has recovered very well from chemo and radiation, with mild residual neutropenia -We discussed her PET from 07/22/17 , showed complete response, previous hypermetabolic external iliac lymph nodes has resolved.  No evidence of residual or metastatic disease. -Clinically doing very well.  Rectal exam was unremarkable, no clinical concern for recurrence. -Continue cancer surveillance.  I encouraged her to follow-up with her surgeon Dr. Marcello Moores in 3 months, and I will see her back in 6 months.  Plan to repeat surveillance PET scan in 1 year if she is clinically doing well.    2. Fatigue and dyspnea  -Secondary to  her moderate anemia, and the recent chemotherapy and irradiation -She received blood transfusion on 05/14/17 -I encouraged her she more and try to be physically active -Near resolved now.  3. Hx of left Breast Cancer, stage 3 -Diagnosed in 2001 -Had mastectomy and ALND, adjuvant chemo therapy and radiation, took tamoxifen and aromasin -Mother diagnosed at age 52. Declines referral to genetics counselor. -We discussed that it is not likely that breast cancer has occurred in her rectum. -I previously recommend genetic testing due to her family history and personal history of breast cancer. She will think about it -Breast exam was normal on 03/25/2017  4. Hypertension  - currently on Norvasc and Hyzaar. -Continue follow-up with PCP  5. Depression - currently on Zoloft.  6. Hypercholesterolemia -on Lipitor.  7. Anemia and  neutropenia -Second to chemotherapy.  Anemia has now resolved, she still has a mild neutropenia.  She previously had prolonged neutropenia after chemotherapy for breast cancer. -Continue monitoring  -Neutropenia precautions reviewed with patient again.   PLAN:  -Reviewed, she has no evidence of residual or recurrent disease -She was seeing Dr. Marcello Moores in 3 months, and we will see her back in 6 months with lab   No orders of the defined types were placed in this encounter.  All questions were answered. The patient knows to call the clinic with any problems, questions or concerns. No barriers to learning was detected.  I spent 20 minutes counseling the patient face to face. The total time spent in the appointment was 25 minutes and more than 50% was on counseling and review of test results    Truitt Merle, MD 07/23/2017

## 2017-07-22 ENCOUNTER — Ambulatory Visit (HOSPITAL_COMMUNITY): Payer: Medicare Other

## 2017-07-22 ENCOUNTER — Encounter (HOSPITAL_COMMUNITY)
Admission: RE | Admit: 2017-07-22 | Discharge: 2017-07-22 | Disposition: A | Discharge status: Home / Self Care | Payer: Medicare Other | Source: Ambulatory Visit | Attending: Nurse Practitioner | Admitting: Nurse Practitioner

## 2017-07-22 DIAGNOSIS — C20 Malignant neoplasm of rectum: Secondary | ICD-10-CM | POA: Insufficient documentation

## 2017-07-22 LAB — GLUCOSE, CAPILLARY: Glucose-Capillary: 94 mg/dL (ref 65–99)

## 2017-07-23 ENCOUNTER — Ambulatory Visit (HOSPITAL_BASED_OUTPATIENT_CLINIC_OR_DEPARTMENT_OTHER): Payer: Medicare Other | Admitting: Hematology

## 2017-07-23 ENCOUNTER — Encounter: Payer: Self-pay | Admitting: Hematology

## 2017-07-23 ENCOUNTER — Other Ambulatory Visit: Payer: Medicare Other

## 2017-07-23 VITALS — BP 131/68 | HR 85 | Temp 98.4°F | Resp 18 | Ht 62.0 in | Wt 138.3 lb

## 2017-07-23 DIAGNOSIS — C20 Malignant neoplasm of rectum: Secondary | ICD-10-CM | POA: Diagnosis not present

## 2017-07-23 DIAGNOSIS — R530 Neoplastic (malignant) related fatigue: Secondary | ICD-10-CM | POA: Diagnosis not present

## 2017-07-23 DIAGNOSIS — Z853 Personal history of malignant neoplasm of breast: Secondary | ICD-10-CM | POA: Diagnosis not present

## 2017-07-23 DIAGNOSIS — I10 Essential (primary) hypertension: Secondary | ICD-10-CM

## 2017-07-23 DIAGNOSIS — D701 Agranulocytosis secondary to cancer chemotherapy: Secondary | ICD-10-CM | POA: Diagnosis not present

## 2017-07-23 DIAGNOSIS — Z23 Encounter for immunization: Secondary | ICD-10-CM

## 2017-07-23 DIAGNOSIS — T451X5A Adverse effect of antineoplastic and immunosuppressive drugs, initial encounter: Secondary | ICD-10-CM

## 2017-07-23 DIAGNOSIS — C801 Malignant (primary) neoplasm, unspecified: Secondary | ICD-10-CM

## 2017-07-23 DIAGNOSIS — F329 Major depressive disorder, single episode, unspecified: Secondary | ICD-10-CM

## 2017-07-23 MED ORDER — INFLUENZA VAC SPLIT HIGH-DOSE 0.5 ML IM SUSY
0.5000 mL | PREFILLED_SYRINGE | Freq: Once | INTRAMUSCULAR | Status: AC
  Administered 2017-07-23: 0.5 mL via INTRAMUSCULAR
  Filled 2017-07-23: qty 0.5

## 2017-07-28 ENCOUNTER — Telehealth: Payer: Self-pay | Admitting: Hematology

## 2017-07-28 NOTE — Telephone Encounter (Signed)
Scheduled appt per 12/11 los - left voicemail for patient regarding appts and sending confirmation letter in the mail.

## 2017-07-30 IMAGING — DX DG SHOULDER 2+V*R*
3 series · 3 of 3 positions shown · non-contrast
Comparison: None.

CLINICAL DATA: Chronic right shoulder pain without known injury.

EXAM:
RIGHT SHOULDER - 2+ VIEW

[shoulder grashey]
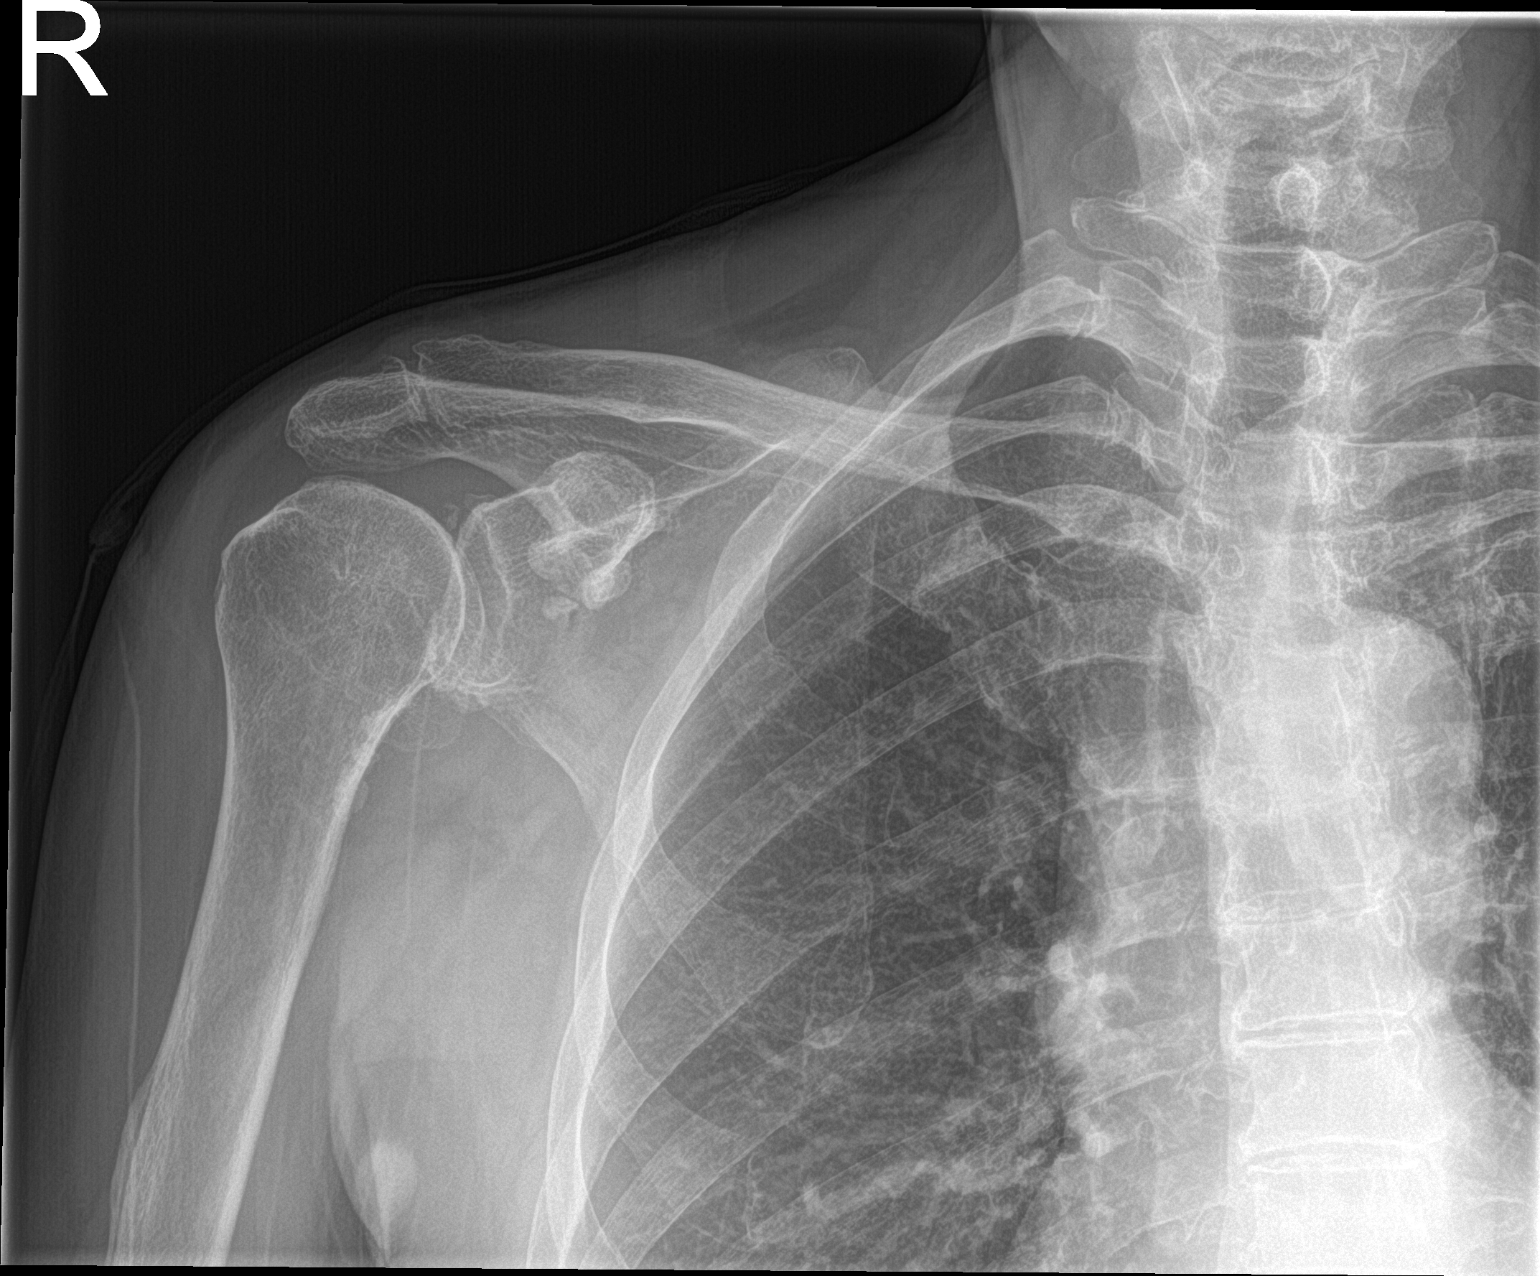

[shoulder y view]
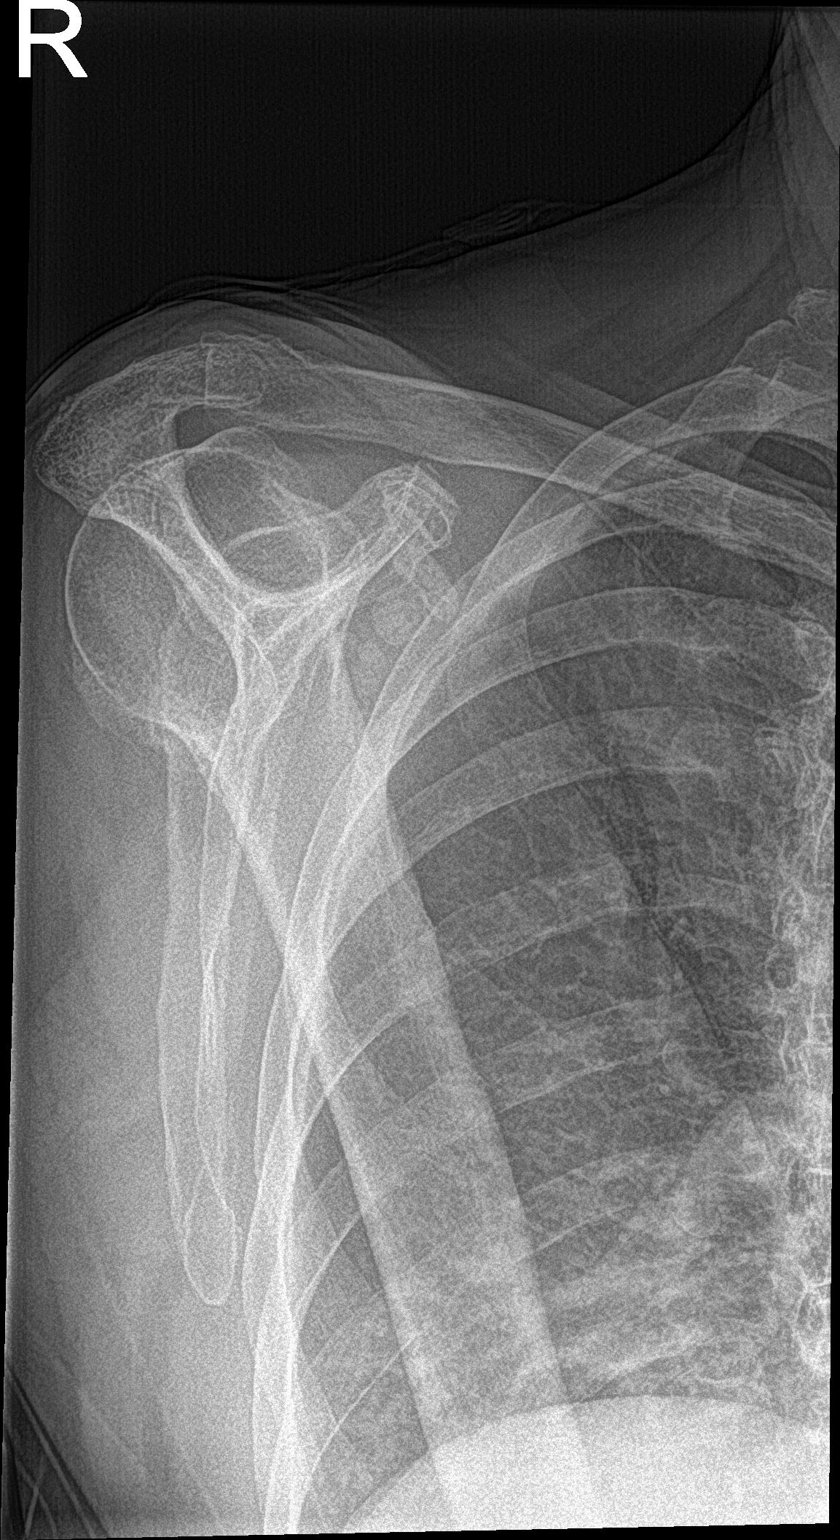

[shoulder axillary]
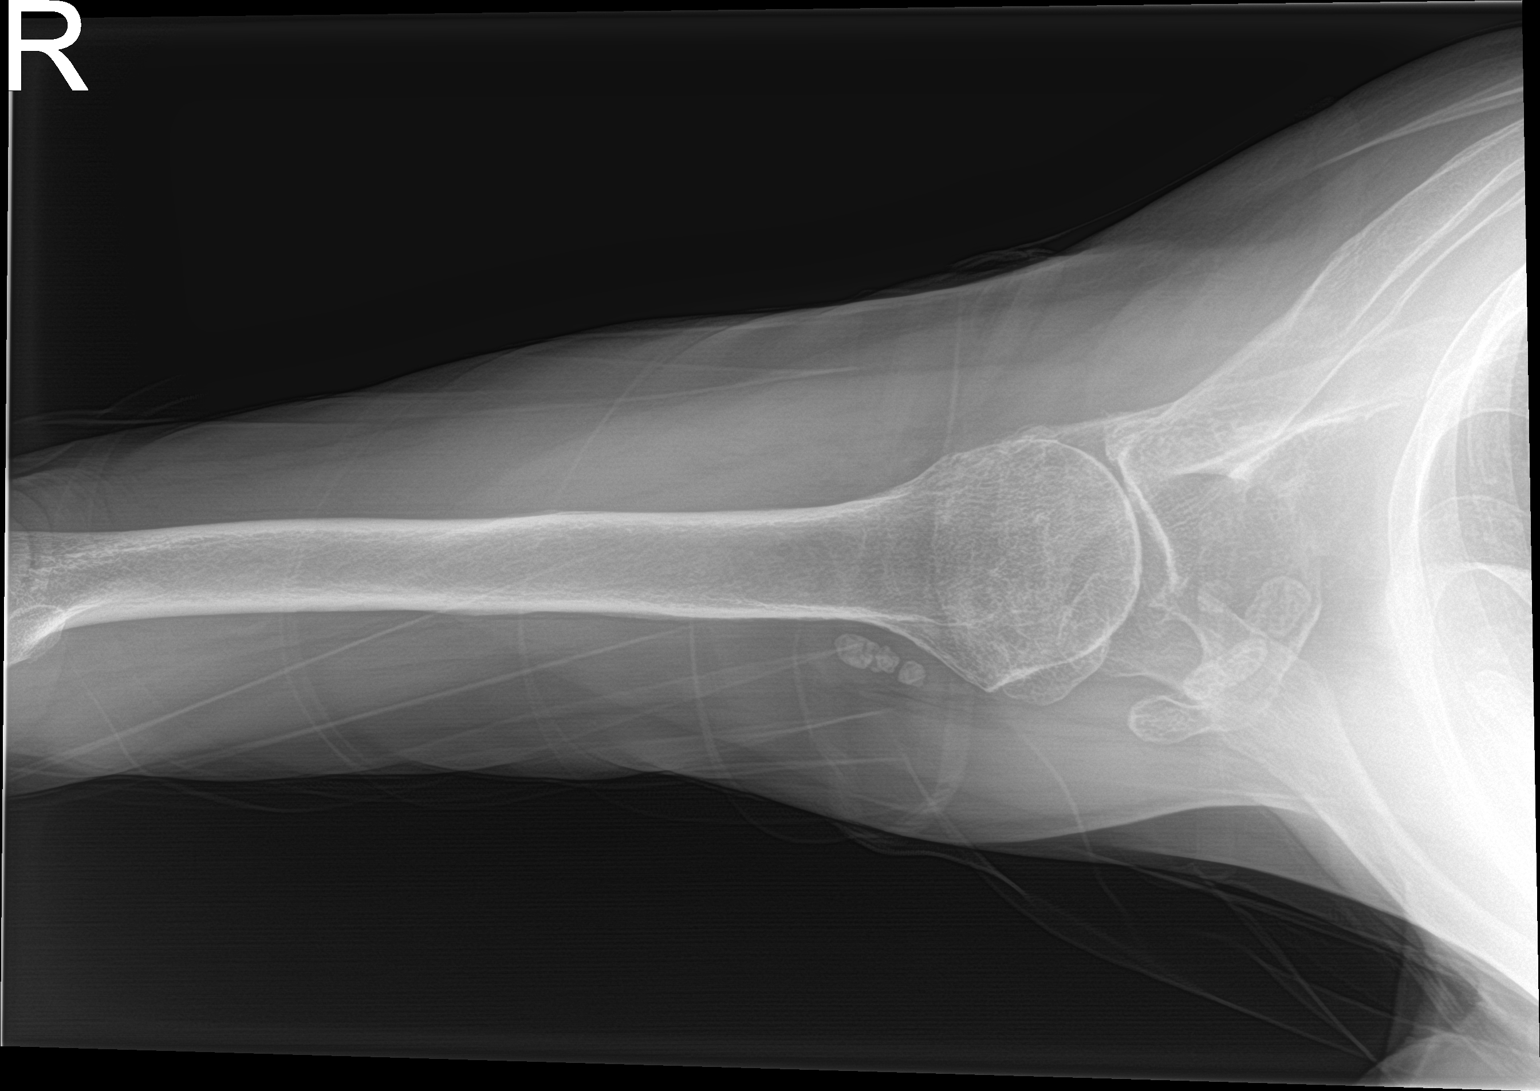

[3 of 3 positions shown; findings below may reference images not displayed]

FINDINGS: There is no evidence of fracture or dislocation. Moderate narrowing
of right acromioclavicular joint is noted. Mild osteophyte formation
is seen involving the glenohumeral joint. Rounded calcific densities
are seen in the subscapularis space as well as anterior to proximal
right humerus suggesting bursitis. Soft tissues are unremarkable.
IMPRESSION: Degenerative joint disease involving right acromioclavicular and
glenohumeral joints. Rounded calcific densities seen and
subscapularis base in anterior to right humerus suggesting chronic
bursitis. No acute abnormality seen in the right shoulder.

## 2017-08-12 ENCOUNTER — Other Ambulatory Visit: Payer: Self-pay

## 2017-08-12 DIAGNOSIS — I10 Essential (primary) hypertension: Secondary | ICD-10-CM

## 2017-08-12 MED ORDER — LOSARTAN POTASSIUM-HCTZ 50-12.5 MG PO TABS: 1.0000 | ORAL_TABLET | Freq: Every day | ORAL | 3 refills | Status: DC

## 2017-08-12 NOTE — Telephone Encounter (Signed)
Refill request for Losartan- HCTZ received from the pharmacy. Refills sent in after review of the patient's chart. MPulliam, CMA/RT(R)

## 2017-10-24 ENCOUNTER — Encounter: Payer: Self-pay | Admitting: Family Medicine

## 2017-10-24 ENCOUNTER — Ambulatory Visit (INDEPENDENT_AMBULATORY_CARE_PROVIDER_SITE_OTHER): Payer: Medicare Other | Admitting: Family Medicine

## 2017-10-24 VITALS — BP 120/82 | HR 94 | Ht 62.0 in | Wt 136.4 lb

## 2017-10-24 DIAGNOSIS — F339 Major depressive disorder, recurrent, unspecified: Secondary | ICD-10-CM

## 2017-10-24 DIAGNOSIS — T451X5A Adverse effect of antineoplastic and immunosuppressive drugs, initial encounter: Secondary | ICD-10-CM

## 2017-10-24 DIAGNOSIS — D6481 Anemia due to antineoplastic chemotherapy: Secondary | ICD-10-CM

## 2017-10-24 DIAGNOSIS — C20 Malignant neoplasm of rectum: Secondary | ICD-10-CM | POA: Diagnosis not present

## 2017-10-24 DIAGNOSIS — G479 Sleep disorder, unspecified: Secondary | ICD-10-CM

## 2017-10-24 DIAGNOSIS — I10 Essential (primary) hypertension: Secondary | ICD-10-CM

## 2017-10-24 DIAGNOSIS — C801 Malignant (primary) neoplasm, unspecified: Secondary | ICD-10-CM | POA: Diagnosis not present

## 2017-10-24 DIAGNOSIS — D701 Agranulocytosis secondary to cancer chemotherapy: Secondary | ICD-10-CM

## 2017-10-24 NOTE — Patient Instructions (Addendum)
-  follow up with Dr. Darene Lamer regarding R shoulder pain, especially since in the past the shot worked so well and give you relief for 6 months.  -Ask your oncologist and radiation oncologist if there are any vaccines or immunizations that you should not get or GET due to your low blood count.   Also ask about screenings including colonoscopy, etc.   Please try to exercise at least 30 minutes daily- by walking around her neighborhood or through the park etc.  This would be extremely good for your physical and mental well-being.  Also help boost your immune system.

## 2017-10-24 NOTE — Progress Notes (Signed)
Impression and Recommendations:    1. HTN (hypertension), benign   2. Depression, recurrent ([place])   3. Chemotherapy induced neutropenia (HCC)   4. Squamous cell carcinoma of rectum (HCC)   5. History of breast Cancer   6. Anemia due to antineoplastic chemotherapy   7. Difficulty sleeping     HTN -BP well controlled in office today. Stable at this time. Continue checking BP and keep a log. Bring this into OV.   -D/C losartan-HCTZ.   -Per pt, she ran out of her losartan-HCTZ combo but has been checking her BP regularly and it has been stable and well-controlled.  -pt instructed to keep an eye on your blood pressure after stopping the losartan-hctz.   1. Depression -mood is stable.  -continue zoloft 50mg .  -make a conscientious attempt to engage in life. Discussed going to the [facility] or other social places.   2. Chemotherapy induced neutropenia- white count chronically suppressed due to h/o breast CA and rectal CA s/p radiation. Ask radiation oncologist about immunizations and vaccines she needs and can receive due to her low white count results.   3. Squamous cell carcinoma of rectum- pt has cancer treatment team including oncologist, radiation oncologist, and surgeon who are all monitoring her closely. She is following up with them in the future. -Recommend pt to be smart about washing her hands, avoiding known sick contacts, and using antibiotic germ-X, as well as other sensible general hygiene.  4. H/o breast CA- pt has chronically suppressed white count.  5. Anemia due to antineoplastic chemotherapy- following up with hematologist/oncologist. CBC has been stable.   6. Difficulty sleeping- start OTC melatonin.  -follow up with Dr. Darene Lamer regarding R shoulder pain.   -continue following up with your cancer treatment team. Ask them about immunizations and vaccines due to her white count  -recommend daily exercise.  -keep doing your artwork.   Gross side effects, risk  and benefits, and alternatives of medications and treatment plan in general discussed with patient.  Patient is aware that all medications have potential side effects and we are unable to predict every side effect or drug-drug interaction that may occur.   Patient will call with any questions prior to using medication if they have concerns.  Expresses verbal understanding and consents to current therapy and treatment regimen.  No barriers to understanding were identified.  Red flag symptoms and signs discussed in detail.  Patient expressed understanding regarding what to do in case of emergency\urgent symptoms  Please see AVS handed out to patient at the end of our visit for further patient instructions/ counseling done pertaining to today's office visit.   Return in about 4 months (around 02/23/2018) for Hypertension follow up every 4 mo.    Note: This note was prepared with assistance of Dragon voice recognition software. Occasional wrong-word or sound-a-like substitutions may have occurred due to the inherent limitations of voice recognition software.  This document serves as a record of services personally performed by [name], DO. It was created on her behalf by Mayer Masker, a trained medical scribe. The creation of this record is based on the scribe's personal observations and the provider's statements to them.   I have reviewed the above medical documentation for accuracy and completeness and I concur.  [name] 10/25/17 1:22 PM   ----------------------------------------------------------------------------------------------------------------------------------------------------------------------------------------------------------    Subjective:     HPI: Denise Wolfe is a 77 y.o. female who presents to [place] at [facility] today  for issues as discussed below.  She feels okay. She has been doing some stuff for her work and has been staying busy  recently.   Exercise She has not been exercise.  Social She has not been socializing as much with people recently.   Mood: Her mood is better. She is not going through as much stuff as before. She is compliant with her zoloft. She has no concerns.  Sleep She is not sleeping well. She is not taking melatonin. She states her R shoulder as well as her racing mind keep her awake. She does not want medications at this time for sleep.    Rectal Cancer She has not seen oncology since December- she is seeing the surgeon next week. She is following up with radiation in 1 month. She is following up with oncology in June. She has a concern with her neutrophils being low.    HTN HPI:  -  Her blood pressure has been controlled at home.  Pt is checking it at home.   - Patient reports good compliance with blood pressure medications. She states, she ran out of her losartan-HCTZ medications, but has been checking her BP regularly (and they have been checking her BP at her oncology appointments) and they have been stable at 110-120/70-80s. She is only taking norvasc at this time.   She states she had swelling on her R leg over the christmas holiday where she was on her feet often and on tile floors. She has had a major R knee injury in the past.    - Denies medication S-E   - Smoking Status noted   - She denies new onset of: chest pain, exercise intolerance, shortness of breath, dizziness, visual changes, headache, lower extremity or claudication.   Last 3 blood pressure readings in our office are as follows: BP Readings from Last 3 Encounters:  10/24/17 120/82  07/23/17 131/68  06/27/17 129/82    Filed Weights   10/24/17 1037  Weight: 136 lb 6.4 oz (61.9 kg)    R shoulder Pt has R shoulder pain. Turning her arm makes her pain worse. Her pain keeps her awake at night. She sees Dr. Dianah Field and got a shot in her shoulder, which helped for 6 months.   Wt Readings from Last 3  Encounters:  10/24/17 136 lb 6.4 oz (61.9 kg)  07/23/17 138 lb 4.8 oz (62.7 kg)  06/27/17 134 lb 14.4 oz (61.2 kg)   BP Readings from Last 3 Encounters:  10/24/17 120/82  07/23/17 131/68  06/27/17 129/82   Pulse Readings from Last 3 Encounters:  10/24/17 94  07/23/17 85  06/27/17 94   BMI Readings from Last 3 Encounters:  10/24/17 24.95 kg/m  07/23/17 25.30 kg/m  06/27/17 24.67 kg/m     Patient Care Team    Relationship Specialty Notifications Start End  [name], DO PCP - General Family Medicine  05/10/16   Silverio Decamp, MD Consulting Physician Family Medicine  07/25/16    Comment: OA - shoulder injeciton  Truitt Merle, MD Consulting Physician Hematology  74/16/38   Leighton Ruff, MD Consulting Physician General Surgery  07/23/17      Patient Active Problem List   Diagnosis Date Noted  . Squamous cell carcinoma of rectum (HCC) 02/14/2017    Priority: High  . HLD (hyperlipidemia) 07/19/2016    Priority: High  . Elevated HDL:  >er 90 07/19/2016    Priority: High  . History of breast Cancer 05/10/2016  Priority: High  . HTN (hypertension), benign 05/10/2016    Priority: High  . 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 Depression, recurrent ([place]) 05/10/2016    Priority: Medium  . Chemotherapy induced neutropenia (HCC) 04/14/2017    Priority: Low  . Vitamin D insufficiency 07/19/2016    Priority: Low  . Degenerative joint disease (DJD) of sternoclavicular joint, right 07/19/2016    Priority: Low  . Pain in joint, shoulder region 05/10/2016    Priority: Low  . Anemia due to antineoplastic chemotherapy 05/14/2017  . Pneumonia of left lower lobe due to infectious organism ([place]) 05/09/2017  . Difficulty sleeping 05/09/2017  . Burning sensation- scalp esp at night 05/09/2017  . Wheeze 05/09/2017  . Attention deficit disorder 12/27/2016  . Multiple atypical nevi 12/27/2016  . Nonspecific chest pain 08/08/2016  .  Retrosternal chest pain 07/19/2016  . Cataract 05/10/2016    Past Medical history, Surgical history, Family history, Social history, Allergies and Medications have been entered into the medical record, reviewed and changed as needed.    Current Meds  Medication Sig  . amLODipine (NORVASC) 5 MG tablet Take 1 tablet (5 mg total) by mouth daily.  Marland Kitchen amphetamine-dextroamphetamine (ADDERALL) 10 MG tablet Take 1 tablet (10 mg total) 2 (two) times daily by mouth.  Marland Kitchen atorvastatin (LIPITOR) 80 MG tablet Take 0.5 tablets (40 mg total) by mouth daily at 6 PM.  . cholecalciferol (VITAMIN D) 400 units TABS tablet Take 400 Units by mouth daily.  . sertraline (ZOLOFT) 50 MG tablet Take 1.5 tablets (75 mg total) daily by mouth.   Current Facility-Administered Medications for the 10/24/17 encounter (Office Visit) with [name], DO  Medication  . 0.9 %  sodium chloride infusion    Allergies:  Allergies  Allergen Reactions  . Ibuprofen Rash     Review of Systems:  A fourteen system review of systems was performed and found to be positive as per HPI.   Objective:   Blood pressure 120/82, pulse 94, height 5\' 2"  (1.575 m), weight 136 lb 6.4 oz (61.9 kg), SpO2 98 %. Body mass index is 24.95 kg/m. General:  Well Developed, well nourished, appropriate for stated age.  Neuro:  Alert and oriented,  extra-ocular muscles intact  HEENT:  Normocephalic, atraumatic, neck supple, no carotid bruits appreciated  Skin:  no gross rash, warm, pink. Cardiac:  RRR, S1 S2 Respiratory:  ECTA B/L and A/P, Not using accessory muscles, speaking in full sentences- unlabored. Vascular:  Ext warm, no cyanosis apprec.; cap RF less 2 sec. Psych:  No HI/SI, judgement and insight good, Euthymic mood. Full Affect.

## 2017-11-05 DIAGNOSIS — Z85048 Personal history of other malignant neoplasm of rectum, rectosigmoid junction, and anus: Secondary | ICD-10-CM | POA: Diagnosis not present

## 2017-11-21 NOTE — Progress Notes (Signed)
Denise Wolfe 77 y.o. woman with squamous cell carcinoma of the proximal anus 6 month FU.  Pain: No Nausea/ Vomiting:No Diarrhea:No Skin irritation:No Vaginal/Rectal bleeding:No Fatigue:No Loss of appetite:No Weight: Wt Readings from Last 3 Encounters:  11/28/17 134 lb (60.8 kg)  10/24/17 136 lb 6.4 oz (61.9 kg)  07/23/17 138 lb 4.8 oz (62.7 kg)   Using dilator:Yes BP 128/67 (BP Location: Right Arm, Patient Position: Sitting, Cuff Size: Normal)   Pulse 97   Temp 98.3 F (36.8 C) (Oral)   Resp 16   Ht 5\' 2"  (1.575 m)   Wt 134 lb (60.8 kg)   SpO2 94%   BMI 24.51 kg/m

## 2017-11-28 ENCOUNTER — Other Ambulatory Visit: Payer: Self-pay

## 2017-11-28 ENCOUNTER — Ambulatory Visit
Admission: RE | Admit: 2017-11-28 | Discharge: 2017-11-28 | Disposition: A | Discharge status: Home / Self Care | Payer: Medicare Other | Source: Ambulatory Visit | Attending: Radiation Oncology | Admitting: Radiation Oncology

## 2017-11-28 ENCOUNTER — Encounter: Payer: Self-pay | Admitting: Radiation Oncology

## 2017-11-28 VITALS — BP 128/67 | HR 97 | Temp 98.3°F | Resp 16 | Ht 62.0 in | Wt 134.0 lb

## 2017-11-28 DIAGNOSIS — Z79899 Other long term (current) drug therapy: Secondary | ICD-10-CM | POA: Insufficient documentation

## 2017-11-28 DIAGNOSIS — F329 Major depressive disorder, single episode, unspecified: Secondary | ICD-10-CM | POA: Insufficient documentation

## 2017-11-28 DIAGNOSIS — R8789 Other abnormal findings in specimens from female genital organs: Secondary | ICD-10-CM | POA: Diagnosis not present

## 2017-11-28 DIAGNOSIS — Z08 Encounter for follow-up examination after completed treatment for malignant neoplasm: Secondary | ICD-10-CM | POA: Insufficient documentation

## 2017-11-28 DIAGNOSIS — I10 Essential (primary) hypertension: Secondary | ICD-10-CM | POA: Insufficient documentation

## 2017-11-28 DIAGNOSIS — Z853 Personal history of malignant neoplasm of breast: Secondary | ICD-10-CM | POA: Insufficient documentation

## 2017-11-28 DIAGNOSIS — Z85048 Personal history of other malignant neoplasm of rectum, rectosigmoid junction, and anus: Secondary | ICD-10-CM | POA: Insufficient documentation

## 2017-11-28 DIAGNOSIS — C211 Malignant neoplasm of anal canal: Secondary | ICD-10-CM

## 2017-12-11 ENCOUNTER — Telehealth: Payer: Self-pay | Admitting: *Deleted

## 2017-12-11 NOTE — Telephone Encounter (Signed)
Called patient to inform of fu appt. with Shona Simpson on 12-02-18 , lvm for a return call

## 2017-12-11 NOTE — Progress Notes (Signed)
Radiation Oncology         [phone] ________________________________  Name: Denise Wolfe MRN: [id number]  Date of Service: 11/28/2017 DOB: 1941/06/03  Post Treatment Note  CC: Denise Dance, DO  [name], MD  Diagnosis:   Stage IIIA, [phone] squamous cell carcinoma of the anus.    Interval Since Last Radiation: 7 months  03/11/2017 to 04/25/2017: The anus/pelvis was treated to 54 Gy in 30 fractions of 1.8 Gy.  Narrative:  Denise Wolfe is a pleasant 77 y.o. female with a history of squamous cell carcinoma of the proximal anus. She presented for screening colonoscopy on 02/11/2017 with Dr. Carlean Purl which revealed a  12 to 15 mm semi-sessile polyp in the rectum (vs proximal anus) and one 2 mm sessile polyp in the descending colon. Biopsy of the descending polyp showed tubular adenoma (one fragment) and no high grade dysplasia or malignancy. Biopsy of the rectum polyp confirmed squamous cell carcinoma, basaloid-type. There are multiple fragments of rectal mucosa which are involved by poorly differentiated squamous cell carcinoma with basaloid features.  CT C/A/P on 02/19/2017 shows abnormal right pelvic sidewall lymph node measuring 1.9 x 1.6 cm, highly suspicious for metastatic disease. There is asymmetric wall thickening distal rectum, presumably the location of primary neoplasm. There is asymmetric airspace opacity in the left apex with a wedge shaped configuration on coronal imaging, this may be an area of scarring or potentially infectious/inflammatory etiology. There is a tiny right perifissural nodule in the right middle lobe, likely subpleural lymph node. Also noted were multiple low-density well-defined liver lesion, most suggestive of cysts. She completed chemoRT in September 2018. She comes for surveillance follow up and reports she has been seen by Dr. Marcello Moores about two months ago and states that her anoscopy was stable and didn't reveal any visible tumor. She is scheduled to see  Dr. Burr Medico in June 2019.   On review of systems, the patient reports that she is doing well overall. She denies any chest pain, shortness of breath, cough, fevers, chills, night sweats, unintended weight changes. She denies any bowel or bladder disturbances, and denies abdominal pain, nausea or vomiting. She reports she has not been using her vaginal dilator, and reports no vaginal bleeding.  She denies any new musculoskeletal or joint aches or pains, new skin lesions or concerns. A complete review of systems is obtained and is otherwise negative.  Past Medical History:  Past Medical History:  Diagnosis Date  . Anemia    during childhood  . Arthritis    hands, shoulder  . Breast cancer ([place]) 03/2000   left side modified mastecomy  . Cancer [facility])    breast, age 3; left side  . Colon cancer ([place]) 02/11/2017   rectum-squamous cell carcinoma  . Depression   . Hypertension   . Squamous cell carcinoma of rectum ([place]) 02/14/2017    Past Surgical History: Past Surgical History:  Procedure Laterality Date  . ABDOMINAL HYSTERECTOMY     With bilateral oophorectomy. 2002.  Marland Kitchen BREAST BIOPSY Right 2004   benign  . BREAST SURGERY Left 2001   biopsy  . CATARACT EXTRACTION Bilateral 2017  . IR FLUORO GUIDE CV LINE RIGHT  03/11/2017  . IR FLUORO GUIDE CV LINE RIGHT  04/16/2017  . IR US GUIDE VASC ACCESS RIGHT  03/11/2017  . IR US GUIDE VASC ACCESS RIGHT  04/16/2017  . MASTECTOMY MODIFIED RADICAL Left 2001  . MENISCUS DEBRIDEMENT Right 2003  . TUMOR EXCISION Right    thigh  area    Social History:  Social History   Socioeconomic History  . Marital status: Widowed    Spouse name: Not on file  . Number of children: Not on file  . Years of education: Not on file  . Highest education level: Not on file  Occupational History  . Not on file  Social Needs  . Financial resource strain: Not on file  . Food insecurity:    Worry: Not on file    Inability: Not on file  . Transportation needs:     Medical: Not on file    Non-medical: Not on file  Tobacco Use  . Smoking status: Never Smoker  . Smokeless tobacco: Never Used  Substance and Sexual Activity  . Alcohol use: Yes    Alcohol/week: 8.4 oz    Types: 14 Glasses of wine per week  . Drug use: No  . Sexual activity: Not Currently  Lifestyle  . Physical activity:    Days per week: Not on file    Minutes per session: Not on file  . Stress: Not on file  Relationships  . Social connections:    Talks on phone: Not on file    Gets together: Not on file    Attends religious service: Not on file    Active member of club or organization: Not on file    Attends meetings of clubs or organizations: Not on file    Relationship status: Not on file  . Intimate partner violence:    Fear of current or ex partner: Not on file    Emotionally abused: Not on file    Physically abused: Not on file    Forced sexual activity: Not on file  Other Topics Concern  . Not on file  Social History Narrative  . Not on file  The patient is widowed. 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 She relocated to [place] to be closer to family a few years ago when her husband became ill and passed.     Family History: Family History  Problem Relation Age of Onset  . Cancer Mother 36       breast  . Heart disease Father   . Colon cancer Neg Hx   . Esophageal cancer Neg Hx   . Rectal cancer Neg Hx   . Stomach cancer Neg Hx      ALLERGIES:  is allergic to ibuprofen.  Meds: Current Outpatient Medications  Medication Sig Dispense Refill  . amLODipine (NORVASC) 5 MG tablet Take 1 tablet (5 mg total) by mouth daily. 90 tablet 3  . amphetamine-dextroamphetamine (ADDERALL) 10 MG tablet Take 1 tablet (10 mg total) 2 (two) times daily by mouth. 180 tablet 0  . atorvastatin (LIPITOR) 80 MG tablet Take 0.5 tablets (40 mg total) by mouth daily at 6 PM. 45 tablet 3  . cholecalciferol (VITAMIN D) 400 units TABS  tablet Take 400 Units by mouth daily.    . sertraline (ZOLOFT) 50 MG tablet Take 1.5 tablets (75 mg total) daily by mouth. 135 tablet 3  . losartan-hydrochlorothiazide (HYZAAR) 50-12.5 MG tablet Take 1 tablet by mouth daily. (Patient not taking: Reported on 11/28/2017) 90 tablet 3   Current Facility-Administered Medications  Medication Dose Route Frequency Provider Last Rate Last Dose  . 0.9 %  sodium chloride infusion  500 mL Intravenous Continuous [name], MD        Physical Findings:  height is 5\' 2"  (1.575  m) and weight is 134 lb (60.8 kg). Her oral temperature is 98.3 F (36.8 C). Her blood pressure is 128/67 and her pulse is 97. Her respiration is 16 and oxygen saturation is 94%.  Pain Assessment Pain Score: 0-No pain/10 In general this is a well appearing caucasian female in no acute distress. She is alert and oriented x4 and appropriate throughout the examination. HEENT reveals that the patient is normocephalic, atraumatic. EOMs are intact. PERRLA. Skin is intact without any evidence of gross lesions. Cardiovascular exam reveals a regular rate and rhythm, no clicks rubs or murmurs are auscultated. Chest is clear to auscultation bilaterally. Lymphatic assessment is performed and does not reveal any adenopathy in the cervical, supraclavicular, axillary, or inguinal chains. Abdomen has active bowel sounds in all quadrants and is intact. The abdomen is soft, non tender, non distended. Lower extremities are negative for pretibial pitting edema, deep calf tenderness, cyanosis or clubbing.Pelvic exam reveals normal appearing external female genitalia.  No gross  lesions are noted of the labial, perineal or perianal tissue.  BUS is normal in appearance.  Speculum exam reveals a normal-appearing vaginal cuff  without bleeding, lesions, or discharge. Bimanual examination reveals a smooth cuff without palpable masses.  No adnexal masses are noted.  Rectovaginal exam reveals smooth septum without  induration or nodularity.  No parametrial thickening is identified.   Lab Findings: Lab Results  Component Value Date   WBC 2.4 (LL) 07/11/2017   HGB 12.2 07/11/2017   HCT 35.5 07/11/2017   MCV 97 07/11/2017   PLT 332 07/11/2017     Radiographic Findings: No results found.  Impression/Plan: 1. 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 We discussed continued surveillance with me annually as she continues to follow up with Dr. Burr Medico and Dr. Marcello Moores. Regarding gynecologic care as it relates to HPV related disease, she has no history of abnormal Pap smears. She's had a hysterectomy and does not need Pap smear screening however does need to have annual external and bimanual examination. We discussed the importance of using vaginal dilators she is not sexually active in order to be able to formally examine her. She states agreement in the next interim. She's encouraged to call she has questions or concerns regarding this.     Carola Rhine, PAC

## 2018-01-27 ENCOUNTER — Ambulatory Visit: Payer: Medicare Other | Admitting: Hematology

## 2018-01-27 ENCOUNTER — Other Ambulatory Visit: Payer: Medicare Other

## 2018-01-28 ENCOUNTER — Encounter: Payer: Self-pay | Admitting: Hematology

## 2018-01-28 ENCOUNTER — Inpatient Hospital Stay (HOSPITAL_BASED_OUTPATIENT_CLINIC_OR_DEPARTMENT_OTHER): Payer: Medicare Other | Admitting: Hematology

## 2018-01-28 ENCOUNTER — Inpatient Hospital Stay: Payer: Medicare Other | Attending: Hematology

## 2018-01-28 VITALS — BP 114/71 | HR 71 | Temp 98.5°F | Resp 18 | Ht 62.0 in | Wt 128.2 lb

## 2018-01-28 DIAGNOSIS — R634 Abnormal weight loss: Secondary | ICD-10-CM

## 2018-01-28 DIAGNOSIS — Z853 Personal history of malignant neoplasm of breast: Secondary | ICD-10-CM | POA: Diagnosis not present

## 2018-01-28 DIAGNOSIS — E7800 Pure hypercholesterolemia, unspecified: Secondary | ICD-10-CM | POA: Diagnosis not present

## 2018-01-28 DIAGNOSIS — K649 Unspecified hemorrhoids: Secondary | ICD-10-CM

## 2018-01-28 DIAGNOSIS — I10 Essential (primary) hypertension: Secondary | ICD-10-CM | POA: Insufficient documentation

## 2018-01-28 DIAGNOSIS — F329 Major depressive disorder, single episode, unspecified: Secondary | ICD-10-CM | POA: Diagnosis not present

## 2018-01-28 DIAGNOSIS — R5383 Other fatigue: Secondary | ICD-10-CM | POA: Diagnosis not present

## 2018-01-28 DIAGNOSIS — D701 Agranulocytosis secondary to cancer chemotherapy: Secondary | ICD-10-CM

## 2018-01-28 DIAGNOSIS — C20 Malignant neoplasm of rectum: Secondary | ICD-10-CM

## 2018-01-28 DIAGNOSIS — C801 Malignant (primary) neoplasm, unspecified: Secondary | ICD-10-CM

## 2018-01-28 LAB — CBC WITH DIFFERENTIAL/PLATELET
BASOS PCT: 1 %
Basophils Absolute: 0 10*3/uL (ref 0.0–0.1)
EOS ABS: 0.2 10*3/uL (ref 0.0–0.5)
Eosinophils Relative: 8 %
HCT: 37.2 % (ref 34.8–46.6)
HEMOGLOBIN: 12.9 g/dL (ref 11.6–15.9)
Lymphocytes Relative: 26 %
Lymphs Abs: 0.7 10*3/uL — ABNORMAL LOW (ref 0.9–3.3)
MCH: 32 pg (ref 25.1–34.0)
MCHC: 34.7 g/dL (ref 31.5–36.0)
MCV: 92.3 fL (ref 79.5–101.0)
MONOS PCT: 16 %
Monocytes Absolute: 0.4 10*3/uL (ref 0.1–0.9)
NEUTROS PCT: 49 %
Neutro Abs: 1.4 10*3/uL — ABNORMAL LOW (ref 1.5–6.5)
Platelets: 252 10*3/uL (ref 145–400)
RBC: 4.03 MIL/uL (ref 3.70–5.45)
RDW: 12.7 % (ref 11.2–14.5)
WBC: 2.8 10*3/uL — ABNORMAL LOW (ref 3.9–10.3)

## 2018-01-28 LAB — COMPREHENSIVE METABOLIC PANEL
ALBUMIN: 4.1 g/dL (ref 3.5–5.0)
ALT: 9 U/L (ref 0–55)
ANION GAP: 10 (ref 3–11)
AST: 18 U/L (ref 5–34)
Alkaline Phosphatase: 43 U/L (ref 40–150)
BUN: 27 mg/dL — ABNORMAL HIGH (ref 7–26)
CO2: 27 mmol/L (ref 22–29)
Calcium: 10 mg/dL (ref 8.4–10.4)
Chloride: 101 mmol/L (ref 98–109)
Creatinine, Ser: 0.91 mg/dL (ref 0.60–1.10)
GFR calc Af Amer: >60 mL/min (ref >60)
GFR calc non Af Amer: >60 mL/min (ref >60)
GLUCOSE: 98 mg/dL (ref 70–140)
Potassium: 3.8 mmol/L (ref 3.5–5.1)
Sodium: 138 mmol/L (ref 136–145)
Total Bilirubin: 0.3 mg/dL (ref 0.2–1.2)
Total Protein: 6.8 g/dL (ref 6.4–8.3)

## 2018-01-28 NOTE — Progress Notes (Signed)
[place]  [phone] [phone]  Clinic Follow up Note   Patient Care Team: [name], DO as PCP - General (Family Medicine) Silverio Decamp, MD as Consulting Physician (Family Medicine) Truitt Merle, MD as Consulting Physician (Hematology) Leighton Ruff, MD as Consulting Physician (General Surgery)   Date of Service: 01/28/2018    SUMMARY OF ONCOLOGIC HISTORY:   Squamous cell carcinoma of rectum ([place])   02/11/2017 Procedure    Colonoscopy 02/11/17 IMPRESSION - One 12 to 15 mm polyp in the rectum (vs proximal anus). Biopsied. Firm, - just inside anal verge or on it? - One 2 mm polyp in the descending colon, removed with a cold biopsy forceps. Resected and retrieved. - The examination was otherwise normal on direct and retroflexion views.       02/11/2017 Pathology Results    Diagnosis 02/11/17 1. Colon, biopsy, descending, polyp - TUBULAR ADENOMA (ONE FRAGMENT). - NO HIGH GRADE DYSPLASIA OR MALIGNANCY. 2. Colon, polyp(s), rectum, polyp - biopsy only - SQUAMOUS CELL CARCINOMA, BASALOID-TYPE. - SEE MICROSCOPIC DESCRIPTION. Microscopic Comment 2. There are multiple fragments of rectal mucosa which are involved by poorly differentiated squamous cell carcinoma with basaloid features.      02/14/2017 Initial Diagnosis    Squamous cell carcinoma of rectum ([place])      02/20/2017 Imaging    CT CAP W Contrast 02/20/17 IMPRESSION: 1. Abnormal right pelvic sidewall lymph node measuring 1.9 x 1.6 cm. Imaging features highly suspicious for metastatic disease. PET-CT may prove helpful to further evaluate. Borderline lymph node identified at the right iliac bifurcation and an upper normal size lymph node is identified along the left external iliac artery chain. 2. Asymmetric wall thickening distal rectum, presumably the location of primary neoplasm. 3. Asymmetric airspace opacity in the left apex has a wedge shaped configuration on  coronal imaging. This may be an area of scarring or potentially infectious/inflammatory etiology. Attention on follow-up recommended. 4. Tiny right perifissural nodule in the right middle lobe, likely subpleural lymph node but attention on follow-up recommended. 5. Multiple low-density well-defined liver lesions, most suggestive of cysts. 6.  Aortic Atherosclerois (ICD10-170.0)      02/25/2017 PET scan    PET 02/25/17 IMPRESSION: 1. Focal accentuated activity in the anus as detailed above, with bilateral external iliac hypermetabolic adenopathy indicating metastatic disease. No hypermetabolic lesion of the liver or other findings of metastatic disease. 2. The tiny nodule adjacent to the minor fissure does not demonstrate hypermetabolic activity but is below sensitive PET-CT size thresholds. This may warrant surveillance. 3. Other imaging findings of potential clinical significance: Aortic Atherosclerosis (ICD10-I70.0). Coronary atherosclerosis. Asymmetric right degenerative glenohumeral arthropathy. Small calcified thyroid nodules are present but not hypermetabolic.      03/11/2017 - 04/25/2017 Radiation Therapy    Concurrent Chemo Radiation with Dr. Lisbeth Renshaw      03/11/2017 - 04/25/2017 Chemotherapy    Concurrent chemotherapoy and Radiation with mitomycin C on Day 1 and 29 and 5-fluorouracil on day 1-5, 29-33, will no longer include mitomycin starting with cycle 2.         CURRENT THERAPY:  Surveillance     INTERVAL HISTORY:  Denise Wolfe is here for a follow up for her rectal cancer. She was last seen by me 6 months ago. She presents to the clinic today by herself. She notes her energy has dropped lately. She notes she still is active but gets exhausted easily. There is overall little to no improvement since she stopped chemotherapy. Sometimes she  will have to stop and take a break with walking or certain activities. She will see Dr. Marcello Moores next week.    On review of symptoms,  pt notes weight loss with normal appetite and eating. She notes persistent low energy. She notes recent episode of GI bleeding which she contributes to hemorrhoids.    REVIEW OF SYSTEMS:  Constitutional: Denies fevers, chills or abnormal weight loss (+) persistent low energy (+) unexpected weight loss with adequate eating Eyes: Denies blurriness of vision Ears, nose, mouth, throat, and face: Denies mucositis or sore throat Respiratory: Denies dyspnea or wheezes or cough  Cardiovascular: Denies palpitation, chest discomfort or lower extremity swelling Gastrointestinal:  Denies nausea, heartburn or change in bowel habits (+) hemorrhoid pain, with 1 episode of GI bleeding  Skin: Denies abnormal skin rashes  Lymphatics: Denies new lymphadenopathy or easy bruising Neurological:Denies numbness, tingling or new weaknesses Behavioral/Psych: Mood is stable, no new changes  All other systems were reviewed with the patient and are negative.   MEDICAL HISTORY:  Past Medical History:  Diagnosis Date  . Anemia    during childhood  . Arthritis    hands, shoulder  . Breast cancer ([place]) 03/2000   left side modified mastecomy  . Cancer [facility])    breast, age 61; left side  . Colon cancer ([place]) 02/11/2017   rectum-squamous cell carcinoma  . Depression   . Hypertension   . Squamous cell carcinoma of rectum ([place]) 02/14/2017    SURGICAL HISTORY: Past Surgical History:  Procedure Laterality Date  . ABDOMINAL HYSTERECTOMY     With bilateral oophorectomy. 2002.  Marland Kitchen BREAST BIOPSY Right 2004   benign  . BREAST SURGERY Left 2001   biopsy  . CATARACT EXTRACTION Bilateral 2017  . IR FLUORO GUIDE CV LINE RIGHT  03/11/2017  . IR FLUORO GUIDE CV LINE RIGHT  04/16/2017  . IR US GUIDE VASC ACCESS RIGHT  03/11/2017  . IR US GUIDE VASC ACCESS RIGHT  04/16/2017  . MASTECTOMY MODIFIED RADICAL Left 2001  . MENISCUS DEBRIDEMENT Right 2003  . TUMOR EXCISION Right    thigh area    I have reviewed the social history  and family history with the patient and they are unchanged from previous note.  ALLERGIES:  is allergic to ibuprofen.  MEDICATIONS:  Current Outpatient Medications  Medication Sig Dispense Refill  . amphetamine-dextroamphetamine (ADDERALL) 10 MG tablet Take 1 tablet (10 mg total) 2 (two) times daily by mouth. 180 tablet 0  . atorvastatin (LIPITOR) 80 MG tablet Take 0.5 tablets (40 mg total) by mouth daily at 6 PM. 45 tablet 3  . cholecalciferol (VITAMIN D) 400 units TABS tablet Take 400 Units by mouth daily.    Marland Kitchen losartan-hydrochlorothiazide (HYZAAR) 50-12.5 MG tablet Take 1 tablet by mouth daily. 90 tablet 3  . sertraline (ZOLOFT) 50 MG tablet Take 1.5 tablets (75 mg total) daily by mouth. 135 tablet 3   Current Facility-Administered Medications  Medication Dose Route Frequency Provider Last Rate Last Dose  . 0.9 %  sodium chloride infusion  500 mL Intravenous Continuous [name], MD        PHYSICAL EXAMINATION: ECOG PERFORMANCE STATUS: 1 Vitals:   01/28/18 1100  BP: 114/71  Pulse: 71  Resp: 18  Temp: 98.5 F (36.9 C)  TempSrc: Oral  SpO2: 99%  Weight: 128 lb 3.2 oz (58.2 kg)  Height: 5\' 2"  (1.575 m)     GENERAL:alert, no distress and comfortable SKIN: skin color, texture, turgor are normal, no  rashes or significant lesions EYES: normal, Conjunctiva are pink and non-injected, sclera clear OROPHARYNX:no exudate, no erythema and lips, buccal mucosa, and tongue normal  NECK: supple, thyroid normal size, non-tender, without nodularity LYMPH:  no palpable lymphadenopathy in the cervical, axillary or inguinal LUNGS: clear to auscultation and percussion with normal breathing effort HEART: regular rate & rhythm and no murmurs and no lower extremity edema ABDOMEN:abdomen soft, non-tender and normal bowel sounds, No local megaly.   Musculoskeletal:no cyanosis of digits and no clubbing  NEURO: alert & oriented x 3 with fluent speech, no focal motor/sensory deficits RECTAL  rectal and inguinal areas skin are normal and intact, (+) Exam showed external and internal hemorrhoids, rectal exam showed no palpable mass or blood on glove   LABORATORY DATA:  I have reviewed the data as listed CBC Latest Ref Rng & Units 01/28/2018 07/11/2017 06/11/2017  WBC 3.9 - 10.3 K/uL 2.8(L) 2.4(LL) 2.4(L)  Hemoglobin 11.6 - 15.9 g/dL 12.9 12.2 11.6  Hematocrit 34.8 - 46.6 % 37.2 35.5 34.2(L)  Platelets 145 - 400 K/uL 252 332 279     CMP Latest Ref Rng & Units 01/28/2018 07/11/2017 06/11/2017  Glucose 70 - 140 mg/dL 98 100(H) 84  BUN 7 - 26 mg/dL 27(H) 23 20.5  Creatinine 0.60 - 1.10 mg/dL 0.91 0.77 0.8  Sodium 136 - 145 mmol/L 138 144 139  Potassium 3.5 - 5.1 mmol/L 3.8 4.3 3.8  Chloride 98 - 109 mmol/L 101 104 -  CO2 22 - 29 mmol/L 27 27 27   Calcium 8.4 - 10.4 mg/dL 10.0 9.3 9.7  Total Protein 6.4 - 8.3 g/dL 6.8 - 6.8  Total Bilirubin 0.2 - 1.2 mg/dL 0.3 - 0.30  Alkaline Phos 40 - 150 U/L 43 - 47  AST 5 - 34 U/L 18 - 14  ALT 0 - 55 U/L 9 - 12   ANC 1.5K today   PATHOLOGY Diagnosis 02/11/17 1. Colon, biopsy, descending, polyp - TUBULAR ADENOMA (ONE FRAGMENT). - NO HIGH GRADE DYSPLASIA OR MALIGNANCY. 2. Colon, polyp(s), rectum, polyp - biopsy only - SQUAMOUS CELL CARCINOMA, BASALOID-TYPE. - SEE MICROSCOPIC DESCRIPTION. Microscopic Comment 2. There are multiple fragments of rectal mucosa which are involved by poorly differentiated squamous cell carcinoma with basaloid features.   PROCEDURES Colonoscopy 02/11/17 IMPRESSION - One 12 to 15 mm polyp in the rectum (vs proximal anus). Biopsied. Firm, - just inside anal verge or on it? - One 2 mm polyp in the descending colon, removed with a cold biopsy forceps. Resected and retrieved. - The examination was otherwise normal on direct and retroflexion views.   RADIOGRAPHIC STUDIES: I have personally reviewed the radiological images as listed and agreed with the findings in the report. No results found.   PET  02/25/17 IMPRESSION: 1. Focal accentuated activity in the anus as detailed above, with bilateral external iliac hypermetabolic adenopathy indicating metastatic disease. No hypermetabolic lesion of the liver or other findings of metastatic disease. 2. The tiny nodule adjacent to the minor fissure does not demonstrate hypermetabolic activity but is below sensitive PET-CT size thresholds. This may warrant surveillance. 3. Other imaging findings of potential clinical significance: Aortic Atherosclerosis (ICD10-I70.0). Coronary atherosclerosis. Asymmetric right degenerative glenohumeral arthropathy. Small calcified thyroid nodules are present but not hypermetabolic.  ASSESSMENT & PLAN:  Denise Wolfe is here for a follow up of rectal SCC. She has a past medical history of breast cancer in 2001, Anemia, Arthritis and HTN.   1. Squamous Cell Carcinoma of Rectum, [id number] -She previously underwent a screening  colonoscopy 02/11/2017. Findings included a 12-15 mm polyp in the rectum, semi-sessile. A 2 mm polyp was found in the descending colon. Pathology on the rectal polyp showed squamous cell carcinoma, basaloid type. The descending colon polyp showed tubular adenoma -CT scans of the chest/abdomen/pelvis 02/19/2017 showed an abnormal right pelvic sidewall lymph node measuring 1.9 x 1.6 cm -We previously discussed her PET from 02/25/17 which showed enlarged hypermetabolic bilateral external iliac lymph nodes (2), no distant metastasis.  -Given the distribution of the pelvic and inguinal lymph nodes which is more consistent with rectal cancer metastasis. Her previous breast cancer was diagnosed 17 years ago, hormonal sensitive, and she received a standard treatment. No evidence of recurrence since then. -She completed the recommended standard chemotherapy with 5-FU and mitomycin regimen, with concurrent radiation 03/11/17- 04/25/17. She tolerated well at first but developed prolonged neutropenia.  -She now has  recovered very well from chemo and radiation, with mild residual neutropenia -We discussed her PET from 07/22/17 which showed complete response, previous hypermetabolic external iliac lymph nodes has resolved.  No evidence of residual or metastatic disease. -She has lost some weight lately and has persistent fatigue.  Lab reviewed, within normal limits.  Her physical exam including rectal exam was unremarkable today.  No high clinical concern for cancer recurrence.  She will see Dr. Marcello Moores next week for anoscopy.  -I plan to scan her at the end of 2019 but will scan earlier if there are concerns for recurrence.  -Continue cancer surveillance. I recommend she stagger her future appointments with me and Dr. Marcello Moores.  -F/u in 6 months with CT scan    2. Fatigue, Weight loss -Fatigue secondary to her moderate anemia, and the recent chemotherapy and radiation -I encouraged her she more and try to be physically active -Her fatigue persists and she has recent weight loss with adequate food intake.  -I encouraged her to continue with a high protein diet.   3. Hx of left Breast Cancer, stage 3 -Diagnosed in 2001 -Had mastectomy and ALND, adjuvant chemo therapy and radiation, took tamoxifen and aromasin -We previously discussed that it is not likely that breast cancer has occurred in her rectum. -I previously recommend genetic testing due to her family history and personal history of breast cancer. She will think about it -She is overdue for her yearly mammogram, I strongly encouraged she be more compliant with her scan.   4. Hypertension  - currently on Norvasc and Hyzaar. -Continue follow-up with PCP  5. Depression - currently on Zoloft.  6. Hypercholesterolemia -on Lipitor.  7. Neutropenia  -Second to chemotherapy.  Anemia has resolved, she still has a mild neutropenia. She previously had prolonged neutropenia after chemotherapy for breast cancer. -Continue monitoring  -ANC at 1.4 today  (01/28/18)   PLAN:  -She is clinically doing well, she has no evidence of residual or recurrent disease -F/u in 6 months -Lab and CT CAP with contrast one week before    Orders Placed This Encounter  Procedures  . CT Abdomen Pelvis W Contrast    Standing Status:   Future    Standing Expiration Date:   01/28/2019    Order Specific Question:   If indicated for the ordered procedure, I authorize the administration of contrast media per Radiology protocol    Answer:   Yes    Order Specific Question:   Preferred imaging location?    Answer:   [facility]    Order Specific Question:   Is Oral Contrast  requested for this exam?    Answer:   Yes, Per Radiology protocol    Order Specific Question:   Radiology Contrast Protocol - do NOT remove file path    Answer:   \\[email or web address]  . CT Chest W Contrast    Standing Status:   Future    Standing Expiration Date:   01/28/2019    Order Specific Question:   If indicated for the ordered procedure, I authorize the administration of contrast media per Radiology protocol    Answer:   Yes    Order Specific Question:   Preferred imaging location?    Answer:   [facility]    Order Specific Question:   Radiology Contrast Protocol - do NOT remove file path    Answer:   \\[email or web address]   All questions were answered. The patient knows to call the clinic with any problems, questions or concerns. No barriers to learning was detected.  I spent 20 minutes counseling the patient face to face. The total time spent in the appointment was 25 minutes and more than 50% was on counseling and review of test results    Truitt Merle, MD 01/28/2018    I, Joslyn Devon, am acting as scribe for Truitt Merle, MD.   I have reviewed the above documentation for accuracy and completeness, and I agree with the above.

## 2018-01-29 ENCOUNTER — Telehealth: Payer: Self-pay | Admitting: Hematology

## 2018-01-29 NOTE — Telephone Encounter (Signed)
Appointments scheduled Letter/Calendar printed and mailed to patient per 6/18 los

## 2018-02-03 ENCOUNTER — Other Ambulatory Visit: Payer: Self-pay | Admitting: Family Medicine

## 2018-02-03 NOTE — Telephone Encounter (Signed)
When was the patient last given this medicine and how many tabs were given?  - Due to it being a controlled substance, she will need OV q 3 mo- so needs OV

## 2018-02-03 NOTE — Telephone Encounter (Signed)
Patient was last seen 10/29/2017.  Please review and advise. MPulliam, CMA/RT(R)

## 2018-02-04 DIAGNOSIS — Z85048 Personal history of other malignant neoplasm of rectum, rectosigmoid junction, and anus: Secondary | ICD-10-CM | POA: Diagnosis not present

## 2018-02-04 NOTE — Telephone Encounter (Signed)
Medication was last filled on 06/27/2017 for # 180.  Patient to be contacted for an office visit for refills. MPulliam, CMA/RT(R)

## 2018-02-05 ENCOUNTER — Telehealth: Payer: Self-pay | Admitting: Family Medicine

## 2018-02-05 NOTE — Telephone Encounter (Signed)
Provider requires OV for Rx refill-- called patient left msg to call office.  --glh

## 2018-02-05 NOTE — Telephone Encounter (Signed)
Noted MPulliam, CMA/RT(R)  

## 2018-02-06 ENCOUNTER — Encounter: Payer: Self-pay | Admitting: Family Medicine

## 2018-02-06 ENCOUNTER — Ambulatory Visit (INDEPENDENT_AMBULATORY_CARE_PROVIDER_SITE_OTHER): Payer: Medicare Other | Admitting: Family Medicine

## 2018-02-06 VITALS — BP 126/82 | HR 80 | Ht 62.0 in | Wt 129.0 lb

## 2018-02-06 DIAGNOSIS — F988 Other specified behavioral and emotional disorders with onset usually occurring in childhood and adolescence: Secondary | ICD-10-CM

## 2018-02-06 MED ORDER — AMPHETAMINE-DEXTROAMPHETAMINE 10 MG PO TABS: 10.0000 mg | ORAL_TABLET | Freq: Two times a day (BID) | ORAL | 0 refills | Status: DC

## 2018-02-06 NOTE — Telephone Encounter (Signed)
Needs OV q 90 d for RF

## 2018-02-06 NOTE — Patient Instructions (Signed)
Follow-up every 3 months for Adderall refills and for assessment of treatment plan on controlled substance

## 2018-02-06 NOTE — Progress Notes (Signed)
ADHD/ ADD OV note   Impression and Recommendations:    1. Attention deficit disorder, unspecified hyperactivity presence     1. ADD -refill given today. Continue meds as listed below.  -encouraged regular exercise. Told pt she needs to come in q 90 days for routine follow ups.   Education and routine counseling performed. Handouts provided if pt desired.  No orders of the defined types were placed in this encounter.    Return in about 3 months (around 05/09/2018).   -Reminded patient the need for yearly complete physical exam office visits in addition to office visits for management of the chronic diseases  -Gross side effects, risk and benefits, and alternatives of medications discussed with patient.  Patient is aware that all medications have potential side effects and we are unable to predict every side effect or drug-drug interaction that may occur.  Expresses verbal understanding and consents to current therapy plan and treatment regimen.   Please see AVS handed out to patient at the end of our visit for further patient instructions/ counseling done pertaining to today's office visit.    Note: This document was prepared using Dragon voice recognition software and may include unintentional dictation errors.  This document serves as a record of services personally performed by [name], DO. It was created on her behalf by Mayer Masker, a trained medical scribe. The creation of this record is based on the scribe's personal observations and the provider's statements to them.   I have reviewed the above medical documentation for accuracy and completeness and I concur.  [name] 02/09/18 11:47 PM  ____________________________________________________________________    Subjective:  HPI: Denise Stott40 y.o. female  presents for 3 month follow up for multiple medical problems.  She is requesting a refill for adderall today. She has been taking 1 tablet  10 mg daily due to lack of money. She feels even this dose will "get her off the launch pad", and once she's up and moving, she feels good, but needs that initial boost.   Mood  She is on 1.5 tablets of zoloft, denies request in changes to med doses. Mood is stable  exercise She is exercising by doing a lot of yard work. She walks regularly. She drinks a lot of water throughout the day.   No problems updated.    Weight:  Wt Readings from Last 3 Encounters:  02/06/18 129 lb (58.5 kg)  01/28/18 128 lb 3.2 oz (58.2 kg)  11/28/17 134 lb (60.8 kg)   BMI Readings from Last 3 Encounters:  02/06/18 23.59 kg/m  01/28/18 23.45 kg/m  11/28/17 24.51 kg/m   BP Readings from Last 3 Encounters:  02/06/18 126/82  01/28/18 114/71  11/28/17 128/67     Review of Systems: General:   No F/C, wt loss Pulm:   No DIB, SOB, pleuritic chest pain Card:  No CP, palpitations Abd:  No n/v/d or pain Ext:  No inc edema from baseline   Objective: Physical Exam: BP 126/82   Pulse 80   Ht 5\' 2"  (1.575 m)   Wt 129 lb (58.5 kg)   SpO2 98%   BMI 23.59 kg/m  Body mass index is 23.59 kg/m. General: Well nourished, in no apparent distress. Eyes: PERRLA, EOMs, conjunctiva clr no swelling or erythema Neck: supple Resp: Respiratory effort- normal, ECTA B/L w/o W/R/R  Cardio: RRR w/o MRGs. Skin: Warm, dry without rashes, lesions, ecchymosis.  Neuro: Alert, Oriented Psych: Normal affect, Insight and Judgment appropriate.  Current Medications:  Current Outpatient Medications on File Prior to Visit  Medication Sig Dispense Refill  . atorvastatin (LIPITOR) 80 MG tablet Take 0.5 tablets (40 mg total) by mouth daily at 6 PM. 45 tablet 3  . cholecalciferol (VITAMIN D) 400 units TABS tablet Take 400 Units by mouth daily.    Marland Kitchen losartan-hydrochlorothiazide (HYZAAR) 50-12.5 MG tablet Take 1 tablet by mouth daily. 90 tablet 3  . sertraline (ZOLOFT) 50 MG tablet Take 1.5 tablets (75 mg total) daily by  mouth. 135 tablet 3   Current Facility-Administered Medications on File Prior to Visit  Medication Dose Route Frequency Provider Last Rate Last Dose  . 0.9 %  sodium chloride infusion  500 mL Intravenous Continuous [name], MD        Medical History:  Patient Active Problem List   Diagnosis Date Noted  . Squamous cell carcinoma of rectum ([facility]) 02/14/2017    Priority: High  . HLD (hyperlipidemia) 07/19/2016    Priority: High  . Elevated HDL:  >er 90 07/19/2016    Priority: High  . History of breast Cancer 05/10/2016    Priority: High  . HTN (hypertension), benign 05/10/2016    Priority: High  . 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 Depression, recurrent ([place]) 05/10/2016    Priority: Medium  . Chemotherapy induced neutropenia ([facility]) 04/14/2017    Priority: Low  . Vitamin D insufficiency 07/19/2016    Priority: Low  . Degenerative joint disease (DJD) of sternoclavicular joint, right 07/19/2016    Priority: Low  . Pain in joint, shoulder region 05/10/2016    Priority: Low  . Anemia due to antineoplastic chemotherapy 05/14/2017  . Pneumonia of left lower lobe due to infectious organism ([place]) 05/09/2017  . Difficulty sleeping 05/09/2017  . Burning sensation- scalp esp at night 05/09/2017  . Wheeze 05/09/2017  . Attention deficit disorder 12/27/2016  . Multiple atypical nevi 12/27/2016  . Nonspecific chest pain 08/08/2016  . Retrosternal chest pain 07/19/2016  . Cataract 05/10/2016    Allergies:  Allergies  Allergen Reactions  . Ibuprofen Rash     Family history-  Reviewed; changed as appropriate  Social history-  Reviewed; changed as appropriate

## 2018-02-06 NOTE — Telephone Encounter (Signed)
Patient came in today for follow up

## 2018-02-10 ENCOUNTER — Ambulatory Visit: Payer: Medicare Other | Admitting: Family Medicine

## 2018-02-26 ENCOUNTER — Ambulatory Visit: Payer: Medicare Other | Admitting: Family Medicine

## 2018-03-17 IMAGING — XA IR US GUIDE VASC ACCESS RIGHT
1 series · 2 of 2 positions shown · non-contrast
Comparison: none

INDICATION: Patient with history of squamous cell carcinoma of anal canal;
central venous access requested for chemotherapy.

[Series 300: line placements · 2 of 2 slices shown]
[im 1/2]
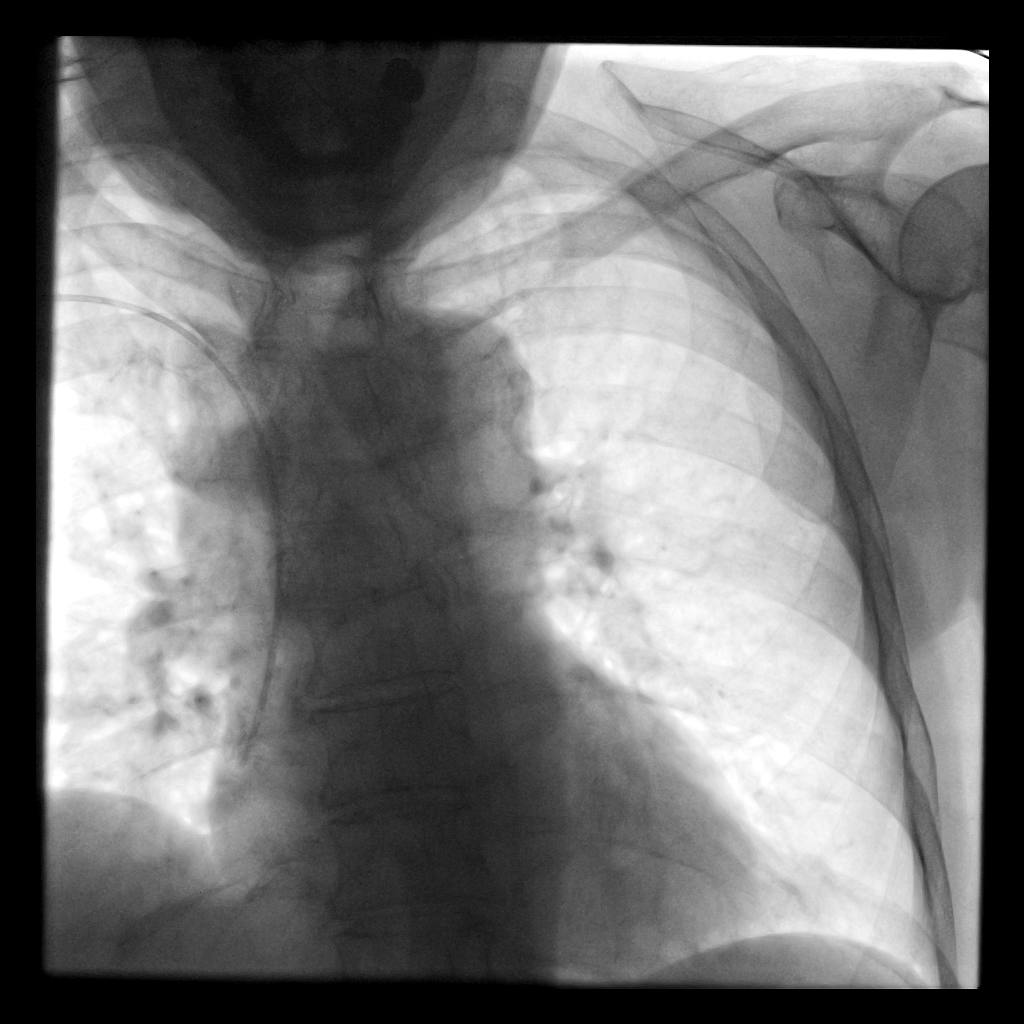
[im 2/2]
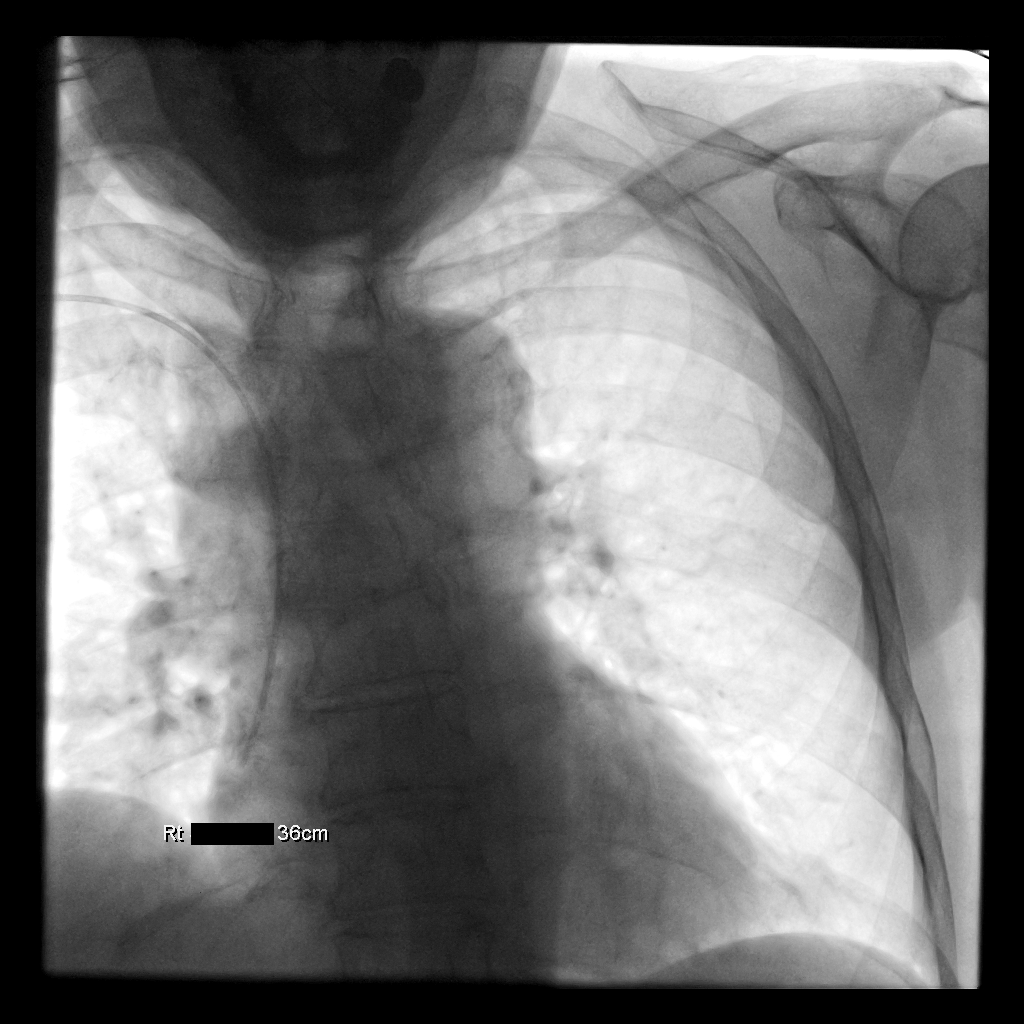

[2 of 2 positions shown; findings below may reference images not displayed]

EXAM:
RIGHT UPPER EXTREMITY PICC LINE PLACEMENT WITH ULTRASOUND AND
FLUOROSCOPIC GUIDANCE

MEDICATIONS:
1% lidocaine to skin and subcutaneous tissue

ANESTHESIA/SEDATION:
None

:
Fluoroscopy Time:  18 seconds (2 mGy).

COMPLICATIONS:
None immediate.

PROCEDURE:
The patient was advised of the possible risks and complications and
agreed to undergo the procedure. The patient was then brought to the
angiographic suite for the procedure.

The right arm was prepped with chlorhexidine, draped in the usual
sterile fashion using maximum barrier technique (cap and mask,
sterile gown, sterile gloves, large sterile sheet, hand hygiene and
cutaneous antisepsis) and infiltrated locally with 1% Lidocaine.

Ultrasound demonstrated patency of the right basilic vein, and this
was documented with an image. Under real-time ultrasound guidance,
this vein was accessed with a 21 gauge micropuncture needle and
image documentation was performed. A [DATE] wire was introduced in to
the vein. Over this, a 5 French single lumen power injectable PICC
was advanced to the lower SVC/right atrial junction. Fluoroscopy
during the procedure and fluoro spot radiograph confirms appropriate
catheter position. The catheter was flushed and covered with a
sterile dressing.

Catheter length:  36 cm
IMPRESSION: Successful right arm Power PICC line placement with ultrasound and
fluoroscopic guidance. The catheter is ready for use.

## 2018-03-31 ENCOUNTER — Encounter: Payer: Self-pay | Admitting: Internal Medicine

## 2018-04-22 IMAGING — XA IR US GUIDE VASC ACCESS RIGHT
1 series · 1 of 1 positions shown · IV contrast (agent unspecified)
Comparison: none

INDICATION: History of rectal cancer. In need of intravenous access for
chemotherapy administration.

EXAM:
ULTRASOUND AND FLUOROSCOPIC GUIDED PICC LINE INSERTION
MEDICATIONS:
None.
CONTRAST:  None
FLUOROSCOPY TIME:  6 seconds (1 mGy)
COMPLICATIONS:
None immediate.
TECHNIQUE: The procedure, risks, benefits, and alternatives were explained to
the patient and informed written consent was obtained. A timeout was
performed prior to the initiation of the procedure.

[Series 300: line placements · 1 of 1 slices shown]
[im 1/1]
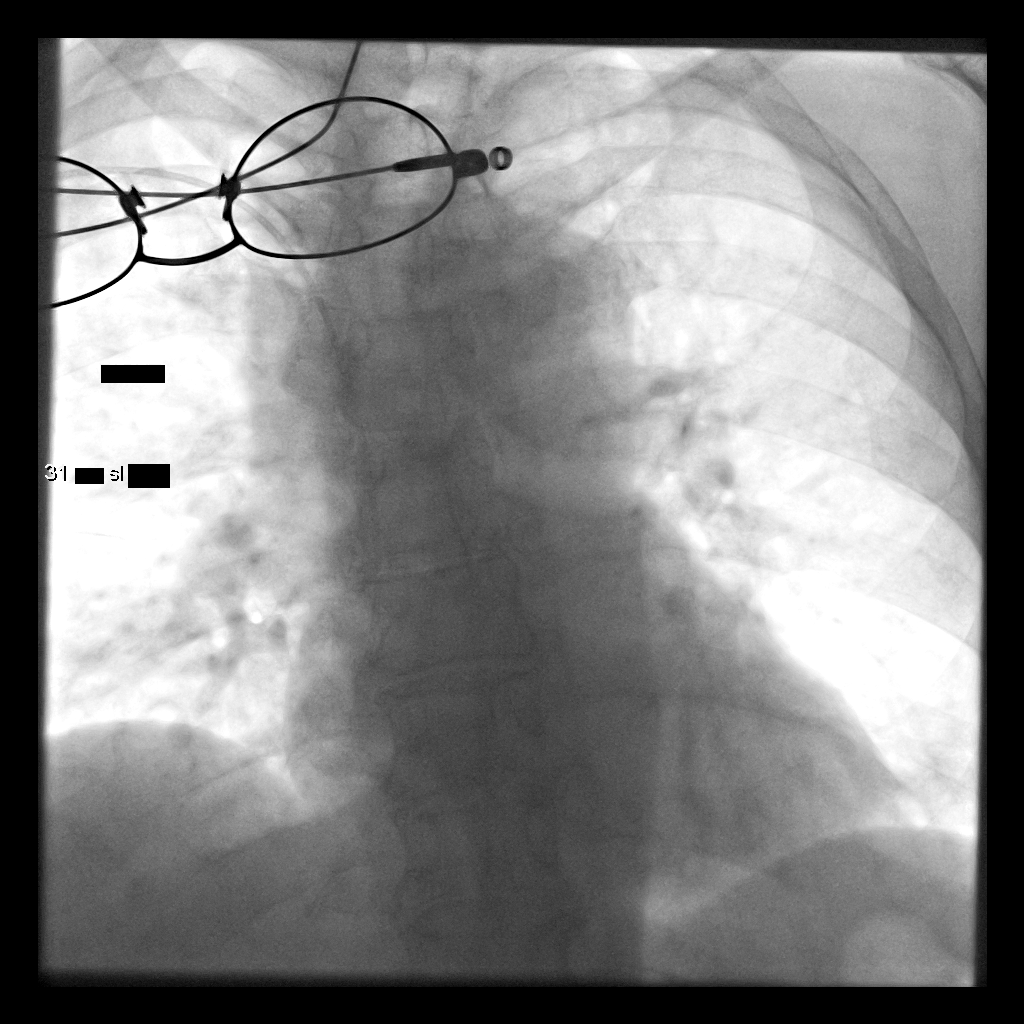

[1 of 1 positions shown; findings below may reference images not displayed]

The right upper extremity was prepped with chlorhexidine in a
sterile fashion, and a sterile drape was applied covering the
operative field. Maximum barrier sterile technique with sterile
gowns and gloves were used for the procedure. A timeout was
performed prior to the initiation of the procedure. Local anesthesia
was provided with 1% lidocaine.

Under direct ultrasound guidance, the basilic vein was accessed with
a micropuncture kit after the overlying soft tissues were
anesthetized with 1% lidocaine.

After the overlying soft tissues were anesthetized, a small venotomy
incision was created and a micropuncture kit was utilized to access
the right basilic vein. Real-time ultrasound guidance was utilized
for vascular access including the acquisition of a permanent
ultrasound image documenting patency of the accessed vessel.

A guidewire was advanced to the level of the superior caval-atrial
junction for measurement purposes and the PICC line was cut to
length. A peel-away sheath was placed and a 31 cm, 5 French, single
lumen was inserted to level of the superior caval-atrial junction. A
post procedure spot fluoroscopic was obtained. The catheter easily
aspirated and flushed and was sutured in place. A dressing was
placed. The patient tolerated the procedure well without immediate
post procedural complication.
FINDINGS: After catheter placement, the tip lies within the superior
cavoatrial junction. The catheter aspirates and flushes normally and
is ready for immediate use.
IMPRESSION: Successful ultrasound and fluoroscopic guided placement of a right
basilic vein approach, 31 cm, 5 French, single lumen PICC with tip
at the superior caval-atrial junction. The PICC line is ready for
immediate use.

## 2018-05-06 DIAGNOSIS — Z85048 Personal history of other malignant neoplasm of rectum, rectosigmoid junction, and anus: Secondary | ICD-10-CM | POA: Diagnosis not present

## 2018-05-07 ENCOUNTER — Ambulatory Visit: Payer: Medicare Other | Admitting: Family Medicine

## 2018-06-26 ENCOUNTER — Encounter: Payer: Self-pay | Admitting: Family Medicine

## 2018-06-26 ENCOUNTER — Ambulatory Visit (INDEPENDENT_AMBULATORY_CARE_PROVIDER_SITE_OTHER): Payer: Medicare Other | Admitting: Family Medicine

## 2018-06-26 VITALS — BP 128/76 | HR 73 | Temp 98.3°F | Ht 62.0 in | Wt 128.7 lb

## 2018-06-26 DIAGNOSIS — Z23 Encounter for immunization: Secondary | ICD-10-CM

## 2018-06-26 DIAGNOSIS — F988 Other specified behavioral and emotional disorders with onset usually occurring in childhood and adolescence: Secondary | ICD-10-CM | POA: Diagnosis not present

## 2018-06-26 MED ORDER — AMPHETAMINE-DEXTROAMPHETAMINE 10 MG PO TABS: 10.0000 mg | ORAL_TABLET | Freq: Two times a day (BID) | ORAL | 0 refills | Status: DC

## 2018-06-26 NOTE — Patient Instructions (Addendum)
Explained to patient she will need to come in every 3 months.  Melissa please make sure her ADD self checklist results goes into the chart.  Thanks  For more information you can go to:  [employer]cy  [email or web address].org/   Your goal blood pressure should be 135/85 or less on a regular basis, her medications should be started.  Normal blood pressure is 120/80 or less.    Hypertension Hypertension, commonly called high blood pressure, is when the force of blood pumping through the arteries is too strong. The arteries are the blood vessels that carry blood from the heart throughout the body. Hypertension forces the heart to work harder to pump blood and may cause arteries to become narrow or stiff. Having untreated or uncontrolled hypertension can cause heart attacks, strokes, kidney disease, and other problems. A blood pressure reading consists of a higher number over a lower number. Ideally, your blood pressure should be below 120/80. The first ("top") number is called the systolic pressure. It is a measure of the pressure in your arteries as your heart beats. The second ("bottom") number is called the diastolic pressure. It is a measure of the pressure in your arteries as the heart relaxes. What are the causes? The cause of this condition is not known. What increases the risk? Some risk factors for high blood pressure are under your control. Others are not. Factors you can change  Smoking.  Having type 2 diabetes mellitus, high cholesterol, or both.  Not getting enough exercise or physical activity.  Being overweight.  Having too much fat, sugar, calories, or salt (sodium) in your diet.  Drinking too much alcohol. Factors that are difficult or impossible to change  Having chronic kidney disease.  Having a family history of high blood pressure.  Age. Risk increases with age.  Race. You may be at higher risk if you are  African-American.  Gender. Men are at higher risk than women before age 36. After age 67, women are at higher risk than men.  Having obstructive sleep apnea.  Stress. What are the signs or symptoms? Extremely high blood pressure (hypertensive crisis) may cause:  Headache.  Anxiety.  Shortness of breath.  Nosebleed.  Nausea and vomiting.  Severe chest pain.  Jerky movements you cannot control (seizures).  How is this diagnosed? This condition is diagnosed by measuring your blood pressure while you are seated, with your arm resting on a surface. The cuff of the blood pressure monitor will be placed directly against the skin of your upper arm at the level of your heart. It should be measured at least twice using the same arm. Certain conditions can cause a difference in blood pressure between your right and left arms. Certain factors can cause blood pressure readings to be lower or higher than normal (elevated) for a short period of time:  When your blood pressure is higher when you are in a health care provider's office than when you are at home, this is called white coat hypertension. Most people with this condition do not need medicines.  When your blood pressure is higher at home than when you are in a health care provider's office, this is called masked hypertension. Most people with this condition may need medicines to control blood pressure.  If you have a high blood pressure reading during one visit or you have normal blood pressure with other risk factors:  You may be asked to return on a different day to have your blood pressure checked again.  You may be asked to monitor your blood pressure at home for 1 week or longer.  If you are diagnosed with hypertension, you may have other blood or imaging tests to help your health care provider understand your overall risk for other conditions. How is this treated? This condition is treated by making healthy lifestyle changes,  such as eating healthy foods, exercising more, and reducing your alcohol intake. Your health care provider may prescribe medicine if lifestyle changes are not enough to get your blood pressure under control, and if:  Your systolic blood pressure is above 130.  Your diastolic blood pressure is above 80.  Your personal target blood pressure may vary depending on your medical conditions, your age, and other factors. Follow these instructions at home: Eating and drinking  Eat a diet that is high in fiber and potassium, and low in sodium, added sugar, and fat. An example eating plan is called the DASH (Dietary Approaches to Stop Hypertension) diet. To eat this way: ? Eat plenty of fresh fruits and vegetables. Try to fill half of your plate at each meal with fruits and vegetables. ? Eat whole grains, such as whole wheat pasta, brown rice, or whole grain bread. Fill about one quarter of your plate with whole grains. ? Eat or drink low-fat dairy products, such as skim milk or low-fat yogurt. ? Avoid fatty cuts of meat, processed or cured meats, and poultry with skin. Fill about one quarter of your plate with lean proteins, such as fish, chicken without skin, beans, eggs, and tofu. ? Avoid premade and processed foods. These tend to be higher in sodium, added sugar, and fat.  Reduce your daily sodium intake. Most people with hypertension should eat less than 1,500 mg of sodium a day.  Limit alcohol intake to no more than 1 drink a day for nonpregnant women and 2 drinks a day for men. One drink equals 12 oz of beer, 5 oz of wine, or 1 oz of hard liquor. Lifestyle  Work with your health care provider to maintain a healthy body weight or to lose weight. Ask what an ideal weight is for you.  Get at least 30 minutes of exercise that causes your heart to beat faster (aerobic exercise) most days of the week. Activities may include walking, swimming, or biking.  Include exercise to strengthen your muscles  (resistance exercise), such as pilates or lifting weights, as part of your weekly exercise routine. Try to do these types of exercises for 30 minutes at least 3 days a week.  Do not use any products that contain nicotine or tobacco, such as cigarettes and e-cigarettes. If you need help quitting, ask your health care provider.  Monitor your blood pressure at home as told by your health care provider.  Keep all follow-up visits as told by your health care provider. This is important. Medicines  Take over-the-counter and prescription medicines only as told by your health care provider. Follow directions carefully. Blood pressure medicines must be taken as prescribed.  Do not skip doses of blood pressure medicine. Doing this puts you at risk for problems and can make the medicine less effective.  Ask your health care provider about side effects or reactions to medicines that you should watch for. Contact a health care provider if:  You think you are having a reaction to a medicine you are taking.  You have headaches that keep coming back (recurring).  You feel dizzy.  You have swelling in your ankles.  You have trouble with your vision. Get help right away if:  You develop a severe headache or confusion.  You have unusual weakness or numbness.  You feel faint.  You have severe pain in your chest or abdomen.  You vomit repeatedly.  You have trouble breathing. Summary  Hypertension is when the force of blood pumping through your arteries is too strong. If this condition is not controlled, it may put you at risk for serious complications.  Your personal target blood pressure may vary depending on your medical conditions, your age, and other factors. For most people, a normal blood pressure is less than 120/80.  Hypertension is treated with lifestyle changes, medicines, or a combination of both. Lifestyle changes include weight loss, eating a healthy, low-sodium diet, exercising  more, and limiting alcohol. This information is not intended to replace advice given to you by your health care provider. Make sure you discuss any questions you have with your health care provider. Document Released: 07/30/2005 Document Revised: 06/27/2016 Document Reviewed: 06/27/2016 Elsevier Interactive Patient Education  2018 [employer].    How to Take Your Blood Pressure   Blood pressure is a measurement of how strongly your blood is pressing against the walls of your arteries. Arteries are blood vessels that carry blood from your heart throughout your body. Your health care provider takes your blood pressure at each office visit. You can also take your own blood pressure at home with a blood pressure machine. You may need to take your own blood pressure:  To confirm a diagnosis of high blood pressure (hypertension).  To monitor your blood pressure over time.  To make sure your blood pressure medicine is working.  Supplies needed: To take your blood pressure, you will need a blood pressure machine. You can buy a blood pressure machine, or blood pressure monitor, at most drugstores or online. There are several types of home blood pressure monitors. When choosing one, consider the following:  Choose a monitor that has an arm cuff.  Choose a monitor that wraps snugly around your upper arm. You should be able to fit only one finger between your arm and the cuff.  Do not choose a monitor that measures your blood pressure from your wrist or finger.  Your health care provider can suggest a reliable monitor that will meet your needs. How to prepare To get the most accurate reading, avoid the following for 30 minutes before you check your blood pressure:  Drinking caffeine.  Drinking alcohol.  Eating.  Smoking.  Exercising.  Five minutes before you check your blood pressure:  Empty your bladder.  Sit quietly without talking in a dining chair, rather than in a soft couch  or armchair.  How to take your blood pressure To check your blood pressure, follow the instructions in the manual that came with your blood pressure monitor. If you have a digital blood pressure monitor, the instructions may be as follows: 1. Sit up straight. 2. Place your feet on the floor. Do not cross your ankles or legs. 3. Rest your left arm at the level of your heart on a table or desk or on the arm of a chair. 4. Pull up your shirt sleeve. 5. Wrap the blood pressure cuff around the upper part of your left arm, 1 inch (2.5 cm) above your elbow. It is best to wrap the cuff around bare skin. 6. Fit the cuff snugly around your arm. You should be able to place only one finger  between the cuff and your arm. 7. Position the cord inside the groove of your elbow. 8. Press the power button. 9. Sit quietly while the cuff inflates and deflates. 10. Read the digital reading on the monitor screen and write it down (record it). 11. Wait 2-3 minutes, then repeat the steps, starting at step 1.  What does my blood pressure reading mean? A blood pressure reading consists of a higher number over a lower number. Ideally, your blood pressure should be below 120/80. The first ("top") number is called the systolic pressure. It is a measure of the pressure in your arteries as your heart beats. The second ("bottom") number is called the diastolic pressure. It is a measure of the pressure in your arteries as the heart relaxes. Blood pressure is classified into four stages. The following are the stages for adults who do not have a short-term serious illness or a chronic condition. Systolic pressure and diastolic pressure are measured in a unit called mm Hg. Normal  Systolic pressure: below 035.  Diastolic pressure: below 80. Elevated  Systolic pressure: 465-681.  Diastolic pressure: below 80. Hypertension stage 1  Systolic pressure: 275-170.  Diastolic pressure: 01-74. Hypertension stage 2  Systolic  pressure: 944 or above.  Diastolic pressure: 90 or above. You can have prehypertension or hypertension even if only the systolic or only the diastolic number in your reading is higher than normal. Follow these instructions at home:  Check your blood pressure as often as recommended by your health care provider.  Take your monitor to the next appointment with your health care provider to make sure: ? That you are using it correctly. ? That it provides accurate readings.  Be sure you understand what your goal blood pressure numbers are.  Tell your health care provider if you are having any side effects from blood pressure medicine. Contact a health care provider if:  Your blood pressure is consistently high. Get help right away if:  Your systolic blood pressure is higher than 180.  Your diastolic blood pressure is higher than 110. This information is not intended to replace advice given to you by your health care provider. Make sure you discuss any questions you have with your health care provider. Document Released: 01/06/2016 Document Revised: 03/20/2016 Document Reviewed: 01/06/2016 Elsevier Interactive Patient Education  [employer].

## 2018-06-26 NOTE — Progress Notes (Signed)
ADHD/ ADD OV note   Impression and Recommendations:    1. Attention deficit disorder, unspecified hyperactivity presence   2. Flu vaccine need     1. ADD Management - Per patient, last appointment was around four months ago.  - Stable at this time. - Continue treatment plan as prescribed.  See med list below. - Patient tolerating meds well without complication.  Denies S-E.  - Discussed prudent diet and avoiding carbohydrate loading. Importance of daily exercise stressed.  - Educational handouts provided.   2. Hypertension - Elevated on intake today. - Per patient, controlled at home. - Continue treatment plan as prescribed.  See med list below. - Patient tolerating meds well without complication.  Denies S-E  - Lifestyle changes such as prudent diet and engaging in a regular exercise program discussed with patient.  Educational handouts provided.  - Ambulatory BP monitoring encouraged. Keep log and bring in next OV   3. Lifestyle & Preventative Health Maintenance - Advised patient to continue working toward exercising to improve overall mental, physical, and emotional health.    American Heart Association guidelines for healthy diet, basically Mediterranean diet, and exercise guidelines of 30 minutes 5 days per week or more discussed in detail.  Health counseling performed.  All questions answered.  - Reviewed the "spokes of the wheel" of mood and health management.  Stressed the importance of ongoing prudent habits, including regular exercise, appropriate sleep hygiene, healthful dietary habits, and prayer/meditation to relax.  - Encouraged patient to engage in daily physical activity, especially a formal exercise routine.  Recommended that the patient eventually strive for at least 150 minutes of moderate cardiovascular activity per week according to guidelines established by the [facility].   - Healthy dietary habits encouraged, including low-carb, and high amounts  of lean protein in diet.   - Patient should also consume adequate amounts of water.   Education and routine counseling performed. Handouts provided if pt desired.   4. Follow-Up - Prescriptions refilled today PRN. - Re-check fasting lab work as recommended. - Continue to follow up with specialists as recommended. - Otherwise, continue to return for CPE and chronic follow-up as scheduled.   - Patient knows to call in sooner if desired to address acute concerns.    Orders Placed This Encounter  Procedures  . Flu vaccine HIGH DOSE PF (Fluzone High dose)    Medications Discontinued During This Encounter  Medication Reason  . amphetamine-dextroamphetamine (ADDERALL) 10 MG tablet      Orders Placed This Encounter  Procedures  . Flu vaccine HIGH DOSE PF (Fluzone High dose)     Return for q 3 mo for add / med RF and also sooner if BP up at home and not 135/85 or less daily.   -Reminded patient the need for yearly complete physical exam office visits in addition to office visits for management of the chronic diseases  -Gross side effects, risk and benefits, and alternatives of medications discussed with patient.  Patient is aware that all medications have potential side effects and we are unable to predict every side effect or drug-drug interaction that may occur.  Expresses verbal understanding and consents to current therapy plan and treatment regimen.   Please see AVS handed out to patient at the end of our visit for further patient instructions/ counseling done pertaining to today's office visit.    Note:  This document was prepared using Dragon voice recognition software and may include unintentional dictation errors.  This document serves as a record of services personally performed by [name], DO. It was created on her behalf by [name], a trained medical scribe. The creation of this record is based on the scribe's personal observations and the provider's  statements to them.    I have reviewed the above medical documentation for accuracy and completeness and I concur.  [name], DO, D.O. 06/27/2018 10:09 AM   _______________________________________________________________   Subjective:  HPI: Denise Stott30 y.o. female  presents for 3 month follow up for evaluation of our treatment plan for pt's ADD/ ADHD.  Patient is here today for follow-up on her ADD/Adderall management.  She continues taking one or two full tablets, depending on "how things are going."  She takes her medication every morning, and sometimes needs another, sometimes doesn't.  Confirms that she is sleeping okay and her appetite remains intact.  She feels that her existing treatment is working well.  Says "let's put it this way; I notice if I don't take it.  It makes a difference."  Blood Pressure Feels that her blood pressure at home is rock stable.  Notes that it's been under control since starting her medications.  States she has been exercising less lately.  Cancer Treatment Notes everything is still looking alright.    She goes in next month for a scan.  States "first scan after the treatments was clear, but this will be a year."    Wt Readings from Last 3 Encounters:  06/26/18 128 lb 11.2 oz (58.4 kg)  02/06/18 129 lb (58.5 kg)  01/28/18 128 lb 3.2 oz (58.2 kg)    BMI Readings from Last 3 Encounters:  06/26/18 23.54 kg/m  02/06/18 23.59 kg/m  01/28/18 23.45 kg/m    BP Readings from Last 3 Encounters:  06/26/18 128/76  02/06/18 126/82  01/28/18 114/71     Review of Systems: General:   No F/C, wt loss Pulm:   No DIB, SOB, pleuritic chest pain Card:  No CP, palpitations Abd:  No n/v/d or pain Ext:  No inc edema from baseline   Objective: Physical Exam: BP 128/76   Pulse 73   Temp 98.3 F (36.8 C)   Ht 5\' 2"  (1.575 m)   Wt 128 lb 11.2 oz (58.4 kg)   SpO2 100%   BMI 23.54 kg/m  Body mass index is 23.54  kg/m. General: Well nourished, in no apparent distress. Eyes: PERRLA, EOMs, conjunctiva clr no swelling or erythema Neck: supple Resp: Respiratory effort- normal, ECTA B/L w/o W/R/R  Cardio: RRR w/o MRGs. Skin: Warm, dry without rashes, lesions, ecchymosis.  Neuro: Alert, Oriented Psych: Normal affect, Insight and Judgment appropriate.    Current Medications:  Current Outpatient Medications on File Prior to Visit  Medication Sig Dispense Refill  . atorvastatin (LIPITOR) 80 MG tablet Take 0.5 tablets (40 mg total) by mouth daily at 6 PM. 45 tablet 3  . cholecalciferol (VITAMIN D) 400 units TABS tablet Take 400 Units by mouth daily.    Marland Kitchen losartan-hydrochlorothiazide (HYZAAR) 50-12.5 MG tablet Take 1 tablet by mouth daily. 90 tablet 3  . sertraline (ZOLOFT) 50 MG tablet Take 1.5 tablets (75 mg total) daily by mouth. 135 tablet 3   Current Facility-Administered Medications on File Prior to Visit  Medication Dose Route Frequency Provider Last Rate Last Dose  . 0.9 %  sodium chloride infusion  500 mL Intravenous Continuous [name], MD        Medical History:  Patient Active  Problem List   Diagnosis Date Noted  . Squamous cell carcinoma of rectum (HCC) 02/14/2017    Priority: High  . HLD (hyperlipidemia) 07/19/2016    Priority: High  . Elevated HDL:  >er 90 07/19/2016    Priority: High  . History of breast Cancer 05/10/2016    Priority: High  . HTN (hypertension), benign 05/10/2016    Priority: High  . 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 Depression, recurrent ([place]) 05/10/2016    Priority: Medium  . Chemotherapy induced neutropenia (HCC) 04/14/2017    Priority: Low  . Vitamin D insufficiency 07/19/2016    Priority: Low  . Degenerative joint disease (DJD) of sternoclavicular joint, right 07/19/2016    Priority: Low  . Pain in joint, shoulder region 05/10/2016    Priority: Low  . Anemia due to antineoplastic chemotherapy 05/14/2017   . Pneumonia of left lower lobe due to infectious organism ([place]) 05/09/2017  . Difficulty sleeping 05/09/2017  . Burning sensation- scalp esp at night 05/09/2017  . Wheeze 05/09/2017  . Attention deficit disorder 12/27/2016  . Multiple atypical nevi 12/27/2016  . Nonspecific chest pain 08/08/2016  . Retrosternal chest pain 07/19/2016  . Cataract 05/10/2016    Allergies:  Allergies  Allergen Reactions  . Ibuprofen Rash     Family history-  Reviewed; changed as appropriate  Social history-  Reviewed; changed as appropriate

## 2018-07-30 ENCOUNTER — Other Ambulatory Visit: Payer: Medicare Other

## 2018-07-31 ENCOUNTER — Ambulatory Visit (HOSPITAL_COMMUNITY)
Admission: RE | Admit: 2018-07-31 | Discharge: 2018-07-31 | Disposition: A | Discharge status: Home / Self Care | Payer: Medicare Other | Source: Ambulatory Visit | Attending: Hematology | Admitting: Hematology

## 2018-07-31 ENCOUNTER — Inpatient Hospital Stay: Payer: Medicare Other | Attending: Hematology

## 2018-07-31 DIAGNOSIS — C20 Malignant neoplasm of rectum: Secondary | ICD-10-CM | POA: Insufficient documentation

## 2018-07-31 DIAGNOSIS — K7689 Other specified diseases of liver: Secondary | ICD-10-CM | POA: Diagnosis not present

## 2018-07-31 DIAGNOSIS — R918 Other nonspecific abnormal finding of lung field: Secondary | ICD-10-CM | POA: Diagnosis not present

## 2018-07-31 DIAGNOSIS — K449 Diaphragmatic hernia without obstruction or gangrene: Secondary | ICD-10-CM | POA: Diagnosis not present

## 2018-07-31 LAB — CBC WITH DIFFERENTIAL/PLATELET
Abs Immature Granulocytes: 0.01 10*3/uL (ref 0.00–0.07)
BASOS ABS: 0 10*3/uL (ref 0.0–0.1)
BASOS PCT: 1 %
EOS PCT: 7 %
Eosinophils Absolute: 0.2 10*3/uL (ref 0.0–0.5)
HCT: 39.6 % (ref 36.0–46.0)
Hemoglobin: 13.4 g/dL (ref 12.0–15.0)
IMMATURE GRANULOCYTES: 0 %
Lymphocytes Relative: 19 %
Lymphs Abs: 0.6 10*3/uL — ABNORMAL LOW (ref 0.7–4.0)
MCH: 31.2 pg (ref 26.0–34.0)
MCHC: 33.8 g/dL (ref 30.0–36.0)
MCV: 92.1 fL (ref 80.0–100.0)
MONOS PCT: 13 %
Monocytes Absolute: 0.4 10*3/uL (ref 0.1–1.0)
NEUTROS PCT: 60 %
NRBC: 0 % (ref 0.0–0.2)
Neutro Abs: 1.8 10*3/uL (ref 1.7–7.7)
PLATELETS: 276 10*3/uL (ref 150–400)
RBC: 4.3 MIL/uL (ref 3.87–5.11)
RDW: 11.4 % — AB (ref 11.5–15.5)
WBC: 2.9 10*3/uL — ABNORMAL LOW (ref 4.0–10.5)

## 2018-07-31 LAB — COMPREHENSIVE METABOLIC PANEL
ALT: 13 U/L (ref 0–44)
ANION GAP: 9 (ref 5–15)
AST: 18 U/L (ref 15–41)
Albumin: 4.1 g/dL (ref 3.5–5.0)
Alkaline Phosphatase: 50 U/L (ref 38–126)
BILIRUBIN TOTAL: 0.3 mg/dL (ref 0.3–1.2)
BUN: 24 mg/dL — ABNORMAL HIGH (ref 8–23)
CO2: 29 mmol/L (ref 22–32)
CREATININE: 0.83 mg/dL (ref 0.44–1.00)
Calcium: 9.8 mg/dL (ref 8.9–10.3)
Chloride: 101 mmol/L (ref 98–111)
GFR calc non Af Amer: >60 mL/min (ref >60)
Glucose, Bld: 88 mg/dL (ref 70–99)
Potassium: 4.6 mmol/L (ref 3.5–5.1)
Sodium: 139 mmol/L (ref 135–145)
TOTAL PROTEIN: 7.2 g/dL (ref 6.5–8.1)

## 2018-07-31 MED ORDER — SODIUM CHLORIDE (PF) 0.9 % IJ SOLN
INTRAMUSCULAR | Status: AC
  Filled 2018-07-31: qty 50

## 2018-07-31 MED ORDER — IOHEXOL 300 MG/ML  SOLN
100.0000 mL | Freq: Once | INTRAMUSCULAR | Status: AC | PRN
  Administered 2018-07-31: 100 mL via INTRAVENOUS

## 2018-08-01 ENCOUNTER — Ambulatory Visit: Payer: Medicare Other | Admitting: Hematology

## 2018-08-04 ENCOUNTER — Telehealth: Payer: Self-pay | Admitting: Hematology

## 2018-08-04 ENCOUNTER — Ambulatory Visit: Payer: Medicare Other | Admitting: Hematology

## 2018-08-04 NOTE — Telephone Encounter (Signed)
Scheduled appt per 12/22 sch message - unable to reach patient - left message with appt date and time

## 2018-08-04 NOTE — Telephone Encounter (Signed)
R/s appt per YF - pt is aware of apt date and time

## 2018-08-07 ENCOUNTER — Ambulatory Visit: Payer: Medicare Other | Admitting: Hematology

## 2018-08-15 ENCOUNTER — Other Ambulatory Visit: Payer: Self-pay

## 2018-08-15 DIAGNOSIS — I10 Essential (primary) hypertension: Secondary | ICD-10-CM

## 2018-08-15 MED ORDER — LOSARTAN POTASSIUM-HCTZ 50-12.5 MG PO TABS: 1.0000 | ORAL_TABLET | Freq: Every day | ORAL | 1 refills | Status: DC

## 2018-08-15 NOTE — Telephone Encounter (Signed)
Pharamcy sent refill request for Losartan/HCTZ.  Reviewed charr and sent in refills per office policy. MPulliam, CMA/RT(R)

## 2018-08-20 ENCOUNTER — Telehealth: Payer: Self-pay | Admitting: Family Medicine

## 2018-08-20 DIAGNOSIS — I10 Essential (primary) hypertension: Secondary | ICD-10-CM

## 2018-08-20 MED ORDER — HYDROCHLOROTHIAZIDE 12.5 MG PO CAPS: 12.5000 mg | ORAL_CAPSULE | Freq: Every day | ORAL | 0 refills | Status: DC

## 2018-08-20 MED ORDER — LOSARTAN POTASSIUM 50 MG PO TABS: 50.0000 mg | ORAL_TABLET | Freq: Every day | ORAL | 0 refills | Status: DC

## 2018-08-20 NOTE — Telephone Encounter (Signed)
Patient is on Losartan-HCTZ that is back ordered.  RXs sent in for separate medications losartan 50mg  and HCTZ 12.5mg .  Patient notified. MPulliam, CMA/RT(R)

## 2018-08-20 NOTE — Telephone Encounter (Signed)
Patient called our office stating that the pharmacy told her the 12.5 losartan is on back order and she would need a new order for her next refill. Patient states she took her last one this morning and is hoping we could get this fixed today for her if at all possible. If approved please send to [facility] on [address] (NOT [place])

## 2018-08-20 NOTE — Addendum Note (Signed)
Addended by: Lanier Prude D on: 08/20/2018 04:17 PM   Modules accepted: Orders

## 2018-08-21 ENCOUNTER — Other Ambulatory Visit: Payer: Self-pay

## 2018-08-21 DIAGNOSIS — F430 Acute stress reaction: Secondary | ICD-10-CM

## 2018-08-21 DIAGNOSIS — F329 Major depressive disorder, single episode, unspecified: Secondary | ICD-10-CM

## 2018-08-21 DIAGNOSIS — F341 Dysthymic disorder: Secondary | ICD-10-CM

## 2018-08-21 DIAGNOSIS — F32A Depression, unspecified: Secondary | ICD-10-CM

## 2018-08-21 MED ORDER — SERTRALINE HCL 50 MG PO TABS: 75.0000 mg | ORAL_TABLET | Freq: Every day | ORAL | 1 refills | Status: DC

## 2018-08-21 NOTE — Telephone Encounter (Signed)
Pharmacy sent refill request for Sertaline.  Reviewed chart and sent in refill per office policy. MPulliam, CMA/RT(R)

## 2018-08-22 ENCOUNTER — Other Ambulatory Visit: Payer: Self-pay

## 2018-08-22 DIAGNOSIS — I10 Essential (primary) hypertension: Secondary | ICD-10-CM

## 2018-08-22 DIAGNOSIS — F430 Acute stress reaction: Secondary | ICD-10-CM

## 2018-08-22 DIAGNOSIS — F341 Dysthymic disorder: Secondary | ICD-10-CM

## 2018-08-22 DIAGNOSIS — F32A Depression, unspecified: Secondary | ICD-10-CM

## 2018-08-22 DIAGNOSIS — F329 Major depressive disorder, single episode, unspecified: Secondary | ICD-10-CM

## 2018-08-22 MED ORDER — LOSARTAN POTASSIUM 50 MG PO TABS: 50.0000 mg | ORAL_TABLET | Freq: Every day | ORAL | 0 refills | Status: DC

## 2018-08-22 MED ORDER — SERTRALINE HCL 50 MG PO TABS: 75.0000 mg | ORAL_TABLET | Freq: Every day | ORAL | 1 refills | Status: DC

## 2018-08-22 MED ORDER — HYDROCHLOROTHIAZIDE 12.5 MG PO CAPS: 12.5000 mg | ORAL_CAPSULE | Freq: Every day | ORAL | 0 refills | Status: DC

## 2018-08-25 ENCOUNTER — Other Ambulatory Visit: Payer: Self-pay

## 2018-08-25 DIAGNOSIS — F32A Depression, unspecified: Secondary | ICD-10-CM

## 2018-08-25 DIAGNOSIS — F341 Dysthymic disorder: Secondary | ICD-10-CM

## 2018-08-25 DIAGNOSIS — F329 Major depressive disorder, single episode, unspecified: Secondary | ICD-10-CM

## 2018-08-25 DIAGNOSIS — F430 Acute stress reaction: Secondary | ICD-10-CM

## 2018-08-25 MED ORDER — SERTRALINE HCL 50 MG PO TABS: 75.0000 mg | ORAL_TABLET | Freq: Every day | ORAL | 0 refills | Status: DC

## 2018-09-01 NOTE — Progress Notes (Addendum)
Denise Wolfe   [phone] [phone]   Clinic Follow up Note   Patient Care Team: [name], DO as PCP - General (Family Medicine) Silverio Decamp, MD as Consulting Physician (Family Medicine) Truitt Merle, MD as Consulting Physician (Hematology) Leighton Ruff, MD as Consulting Physician (General Surgery)  Date of Service:  09/03/2018  CHIEF COMPLAINT: F/u of Rectal cancer   SUMMARY OF ONCOLOGIC HISTORY:   Squamous cell carcinoma of rectum ([place])   02/11/2017 Procedure    Colonoscopy 02/11/17 IMPRESSION - One 12 to 15 mm polyp in the rectum (vs proximal anus). Biopsied. Firm, - just inside anal verge or on it? - One 2 mm polyp in the descending colon, removed with a cold biopsy forceps. Resected and retrieved. - The examination was otherwise normal on direct and retroflexion views.     02/11/2017 Pathology Results    Diagnosis 02/11/17 1. Colon, biopsy, descending, polyp - TUBULAR ADENOMA (ONE FRAGMENT). - NO HIGH GRADE DYSPLASIA OR MALIGNANCY. 2. Colon, polyp(s), rectum, polyp - biopsy only - SQUAMOUS CELL CARCINOMA, BASALOID-TYPE. - SEE MICROSCOPIC DESCRIPTION. Microscopic Comment 2. There are multiple fragments of rectal mucosa which are involved by poorly differentiated squamous cell carcinoma with basaloid features.    02/14/2017 Initial Diagnosis    Squamous cell carcinoma of rectum ([place])    02/20/2017 Imaging    CT CAP W Contrast 02/20/17 IMPRESSION: 1. Abnormal right pelvic sidewall lymph node measuring 1.9 x 1.6 cm. Imaging features highly suspicious for metastatic disease. PET-CT may prove helpful to further evaluate. Borderline lymph node identified at the right iliac bifurcation and an upper normal size lymph node is identified along the left external iliac artery chain. 2. Asymmetric wall thickening distal rectum, presumably the location of primary neoplasm. 3. Asymmetric airspace opacity in the left apex has a wedge  shaped configuration on coronal imaging. This may be an area of scarring or potentially infectious/inflammatory etiology. Attention on follow-up recommended. 4. Tiny right perifissural nodule in the right middle lobe, likely subpleural lymph node but attention on follow-up recommended. 5. Multiple low-density well-defined liver lesions, most suggestive of cysts. 6.  Aortic Atherosclerois (ICD10-170.0)    02/25/2017 PET scan    PET 02/25/17 IMPRESSION: 1. Focal accentuated activity in the anus as detailed above, with bilateral external iliac hypermetabolic adenopathy indicating metastatic disease. No hypermetabolic lesion of the liver or other findings of metastatic disease. 2. The tiny nodule adjacent to the minor fissure does not demonstrate hypermetabolic activity but is below sensitive PET-CT size thresholds. This may warrant surveillance. 3. Other imaging findings of potential clinical significance: Aortic Atherosclerosis (ICD10-I70.0). Coronary atherosclerosis. Asymmetric right degenerative glenohumeral arthropathy. Small calcified thyroid nodules are present but not hypermetabolic.    03/11/2017 - 04/25/2017 Radiation Therapy    Concurrent Chemo Radiation with Dr. Lisbeth Renshaw    03/11/2017 - 04/25/2017 Chemotherapy    Concurrent chemotherapoy and Radiation with mitomycin C on Day 1 and 29 and 5-fluorouracil on day 1-5, 29-33, will no longer include mitomycin starting with cycle 2.      07/31/2018 Imaging    CT CAP w contrast IMPRESSION: No evidence of metastatic disease in the chest, abdomen or pelvis. Chronic findings include: Aortic Atherosclerosis (ICD10-I70.0). Three-vessel coronary atherosclerosis. Small hiatal hernia.      CURRENT THERAPY:  Surveillance   INTERVAL HISTORY:  Denise Wolfe is here for a follow up of rectal cancer. She was last seen by me 7 months ago. She presents to the clinic today by herself. She notes  she is doing well overall. She notes she has loose  stool almost every time she has a BM. She also notes mucus with stool but no blood. She denies incontinence. She notes she did not proceed with PT . She denies any other issues or repeat infections. She denies abdominal or respiratory issues. She denies headaches or chest pain from elevated BP. She has not been checking her BP often at home but is willing to.  She notes she has not been getting mammograms.    REVIEW OF SYSTEMS:   Constitutional: Denies fevers, chills or abnormal weight loss Eyes: Denies blurriness of vision Ears, nose, mouth, throat, and face: Denies mucositis or sore throat Respiratory: Denies cough, dyspnea or wheezes Cardiovascular: Denies palpitation, chest discomfort or lower extremity swelling Gastrointestinal:  Denies nausea, heartburn (+) Loose stool (+) Hemorrhoids  Skin: Denies abnormal skin rashes Lymphatics: Denies new lymphadenopathy or easy bruising Neurological:Denies numbness, tingling or new weaknesses Behavioral/Psych: Mood is stable, no new changes  All other systems were reviewed with the patient and are negative.  MEDICAL HISTORY:  Past Medical History:  Diagnosis Date  . Anemia    during childhood  . Arthritis    hands, shoulder  . Breast cancer ([place]) 03/2000   left side modified mastecomy  . Cancer [facility])    breast, age 58; left side  . Colon cancer ([place]) 02/11/2017   rectum-squamous cell carcinoma  . Depression   . Hypertension   . Squamous cell carcinoma of rectum ([place]) 02/14/2017    SURGICAL HISTORY: Past Surgical History:  Procedure Laterality Date  . ABDOMINAL HYSTERECTOMY     With bilateral oophorectomy. 2002.  [facility] BREAST BIOPSY Right 2004   benign  . BREAST SURGERY Left 2001   biopsy  . CATARACT EXTRACTION Bilateral 2017  . IR FLUORO GUIDE CV LINE RIGHT  03/11/2017  . IR FLUORO GUIDE CV LINE RIGHT  04/16/2017  . IR US GUIDE VASC ACCESS RIGHT  03/11/2017  . IR US GUIDE VASC ACCESS RIGHT  04/16/2017  . MASTECTOMY MODIFIED RADICAL  Left 2001  . MENISCUS DEBRIDEMENT Right 2003  . TUMOR EXCISION Right    thigh area    I have reviewed the social history and family history with the patient and they are unchanged from previous note.  ALLERGIES:  is allergic to ibuprofen.  MEDICATIONS:  Current Outpatient Medications  Medication Sig Dispense Refill  . amphetamine-dextroamphetamine (ADDERALL) 10 MG tablet Take 1 tablet (10 mg total) by mouth 2 (two) times daily. 180 tablet 0  . atorvastatin (LIPITOR) 80 MG tablet Take 0.5 tablets (40 mg total) by mouth daily at 6 PM. 45 tablet 3  . cholecalciferol (VITAMIN D) 400 units TABS tablet Take 400 Units by mouth daily.    . hydrochlorothiazide (MICROZIDE) 12.5 MG capsule Take 1 capsule (12.5 mg total) by mouth daily. 90 capsule 0  . losartan (COZAAR) 50 MG tablet Take 1 tablet (50 mg total) by mouth daily. 90 tablet 0  . sertraline (ZOLOFT) 50 MG tablet Take 1.5 tablets (75 mg total) by mouth daily. 135 tablet 0   Current Facility-Administered Medications  Medication Dose Route Frequency Provider Last Rate Last Dose  . 0.9 %  sodium chloride infusion  500 mL Intravenous Continuous [name], MD        PHYSICAL EXAMINATION: ECOG PERFORMANCE STATUS: 0 - Asymptomatic  Vitals:   09/03/18 1049 09/03/18 1102  BP: (!) 160/85 (!) 149/101  Pulse: 84   Resp: 17   SpO2:  99%    Filed Weights   09/03/18 1049  Weight: 130 lb 9.6 oz (59.2 kg)    GENERAL:alert, no distress and comfortable SKIN: skin color, texture, turgor are normal, no rashes or significant lesions EYES: normal, Conjunctiva are pink and non-injected, sclera clear OROPHARYNX:no exudate, no erythema and lips, buccal mucosa, and tongue normal  NECK: supple, thyroid normal size, non-tender, without nodularity LYMPH:  no palpable lymphadenopathy in the cervical, axillary or inguinal LUNGS: clear to auscultation and percussion with normal breathing effort HEART: regular rate & rhythm and no murmurs and no  lower extremity edema ABDOMEN:abdomen soft, non-tender and normal bowel sounds Musculoskeletal:no cyanosis of digits and no clubbing  NEURO: alert & oriented x 3 with fluent speech, no focal motor/sensory deficits RECTAL EXAM: External and internal hemorrhoid (+) No other palpable mass and no blood on tip of glove   LABORATORY DATA:  I have reviewed the data as listed CBC Latest Ref Rng & Units 07/31/2018 01/28/2018 07/11/2017  WBC 4.0 - 10.5 K/uL 2.9(L) 2.8(L) 2.4(LL)  Hemoglobin 12.0 - 15.0 g/dL 13.4 12.9 12.2  Hematocrit 36.0 - 46.0 % 39.6 37.2 35.5  Platelets 150 - 400 K/uL 276 252 332     CMP Latest Ref Rng & Units 07/31/2018 01/28/2018 07/11/2017  Glucose 70 - 99 mg/dL 88 98 100(H)  BUN 8 - 23 mg/dL 24(H) 27(H) 23  Creatinine 0.44 - 1.00 mg/dL 0.83 0.91 0.77  Sodium 135 - 145 mmol/L 139 138 144  Potassium 3.5 - 5.1 mmol/L 4.6 3.8 4.3  Chloride 98 - 111 mmol/L 101 101 104  CO2 22 - 32 mmol/L 29 27 27   Calcium 8.9 - 10.3 mg/dL 9.8 10.0 9.3  Total Protein 6.5 - 8.1 g/dL 7.2 6.8 -  Total Bilirubin 0.3 - 1.2 mg/dL 0.3 0.3 -  Alkaline Phos 38 - 126 U/L 50 43 -  AST 15 - 41 U/L 18 18 -  ALT 0 - 44 U/L 13 9 -      RADIOGRAPHIC STUDIES: I have personally reviewed the radiological images as listed and agreed with the findings in the report. No results found.   ASSESSMENT & PLAN:  Shalaya Swailes is a 78 y.o. female with   1. Squamous Cell Carcinoma of Rectum, [id number] -She was diagnosed in 02/2017. She is s/p concurrent chemoradiaiton with Mitomycin and 5-FU.  -She still has chronic loose stool post treatment. I recommend she due Pelvic Floor PT. She will think about it.  -We reviewed her CT CAP from 07/31/18 which showed no evidence of disease but showed Three-vessel coronary atherosclerosis and small hiatal hernia. I encouraged her to follow up with PCP or Cardiologist for cardiac work up. I personally review her scan and discussed with her  -She is clinically doing well. Lab  reviewed, her CBC and CMP are WNL except WBC at 2.9 and BUN at 24. Her physical exam was unremarkable. There is no clinical concern for recurrence.  -Continue cancer surveillance. -F/u in 4 months and will order next CT scan at that time  2. Hx of left Breast Cancer, stage 3 -Diagnosed in 2001. She is s/p left mastectomy and ALND, adjuvant chemo therapy and radiation, took tamoxifen and Aromasin. -I previously recommend genetic testing due to her family history and personal history of breast cancer. She will think about it -She remains very overdue for her yearly mammogram, I again strongly encouraged she be more compliant with her scan. I will ask my scheduler to call breast center to schedule  for her today   3. Hypertension  - currently on Norvasc and Hyzaar. -Continue follow-up with PCP -BP elevated at 149/101 today (09/03/18) -I strongly encouraged her to monitor BP at home and f/u with PCP   4. Depression -currently on Zoloft. -mood is stable   5. Hypercholesterolemia -on Lipitor.  6. Neutropenia  -Second to chemotherapy.  -improved. Continue monitoring  -ANC normal currently at 1.8 today and WBC at 2.9 today (09/03/18)   PLAN:  -She is clinically doing well, she has no evidence of residual or recurrent disease -Please call [facility] to schedule her mammogram in a few weeks  -Lab and f/u in 4 months, she will f/u with Dr. Marcello Moores in 8 months    No problem-specific Assessment & Plan notes found for this encounter.   No orders of the defined types were placed in this encounter.  All questions were answered. The patient knows to call the clinic with any problems, questions or concerns. No barriers to learning was detected. I spent 20 minutes counseling the patient face to face. The total time spent in the appointment was 25 minutes and more than 50% was on counseling and review of test results     Truitt Merle, MD 09/03/2018   I, Joslyn Devon, am acting as scribe for Truitt Merle,  MD.   I have reviewed the above documentation for accuracy and completeness, and I agree with the above.

## 2018-09-03 ENCOUNTER — Telehealth: Payer: Self-pay | Admitting: Hematology

## 2018-09-03 ENCOUNTER — Inpatient Hospital Stay: Payer: Medicare Other | Attending: Hematology | Admitting: Hematology

## 2018-09-03 ENCOUNTER — Other Ambulatory Visit: Payer: Self-pay | Admitting: Hematology

## 2018-09-03 ENCOUNTER — Encounter: Payer: Self-pay | Admitting: Hematology

## 2018-09-03 VITALS — BP 149/101 | HR 84 | Resp 17 | Ht 62.0 in | Wt 130.6 lb

## 2018-09-03 DIAGNOSIS — C20 Malignant neoplasm of rectum: Secondary | ICD-10-CM

## 2018-09-03 DIAGNOSIS — Z853 Personal history of malignant neoplasm of breast: Secondary | ICD-10-CM | POA: Insufficient documentation

## 2018-09-03 DIAGNOSIS — E7800 Pure hypercholesterolemia, unspecified: Secondary | ICD-10-CM | POA: Insufficient documentation

## 2018-09-03 DIAGNOSIS — F329 Major depressive disorder, single episode, unspecified: Secondary | ICD-10-CM | POA: Insufficient documentation

## 2018-09-03 DIAGNOSIS — Z9012 Acquired absence of left breast and nipple: Secondary | ICD-10-CM | POA: Insufficient documentation

## 2018-09-03 DIAGNOSIS — D701 Agranulocytosis secondary to cancer chemotherapy: Secondary | ICD-10-CM | POA: Diagnosis not present

## 2018-09-03 DIAGNOSIS — I10 Essential (primary) hypertension: Secondary | ICD-10-CM

## 2018-09-03 DIAGNOSIS — C801 Malignant (primary) neoplasm, unspecified: Secondary | ICD-10-CM

## 2018-09-03 DIAGNOSIS — Z1231 Encounter for screening mammogram for malignant neoplasm of breast: Secondary | ICD-10-CM

## 2018-09-03 NOTE — Telephone Encounter (Signed)
Scheduled appt per 01/22 los.  Called [facility] to schedule MM for patient.  Printed calendar and avs.

## 2018-09-26 ENCOUNTER — Ambulatory Visit
Admission: RE | Admit: 2018-09-26 | Discharge: 2018-09-26 | Disposition: A | Discharge status: Home / Self Care | Payer: Medicare Other | Source: Ambulatory Visit | Attending: Hematology | Admitting: Hematology

## 2018-09-26 ENCOUNTER — Other Ambulatory Visit: Payer: Self-pay | Admitting: Hematology

## 2018-09-26 DIAGNOSIS — Z1231 Encounter for screening mammogram for malignant neoplasm of breast: Secondary | ICD-10-CM

## 2018-09-26 HISTORY — DX: Personal history of antineoplastic chemotherapy: Z92.21

## 2018-09-26 HISTORY — DX: Personal history of irradiation: Z92.3

## 2018-09-30 ENCOUNTER — Encounter: Payer: Self-pay | Admitting: Family Medicine

## 2018-09-30 ENCOUNTER — Ambulatory Visit (INDEPENDENT_AMBULATORY_CARE_PROVIDER_SITE_OTHER): Payer: Medicare Other | Admitting: Family Medicine

## 2018-09-30 VITALS — BP 139/83 | HR 79 | Temp 98.3°F | Ht 62.0 in | Wt 131.0 lb

## 2018-09-30 DIAGNOSIS — C50919 Malignant neoplasm of unspecified site of unspecified female breast: Secondary | ICD-10-CM | POA: Diagnosis not present

## 2018-09-30 DIAGNOSIS — T451X5A Adverse effect of antineoplastic and immunosuppressive drugs, initial encounter: Secondary | ICD-10-CM

## 2018-09-30 DIAGNOSIS — I10 Essential (primary) hypertension: Secondary | ICD-10-CM | POA: Diagnosis not present

## 2018-09-30 DIAGNOSIS — E559 Vitamin D deficiency, unspecified: Secondary | ICD-10-CM

## 2018-09-30 DIAGNOSIS — D701 Agranulocytosis secondary to cancer chemotherapy: Secondary | ICD-10-CM

## 2018-09-30 DIAGNOSIS — F339 Major depressive disorder, recurrent, unspecified: Secondary | ICD-10-CM | POA: Diagnosis not present

## 2018-09-30 DIAGNOSIS — F430 Acute stress reaction: Secondary | ICD-10-CM | POA: Diagnosis not present

## 2018-09-30 DIAGNOSIS — E782 Mixed hyperlipidemia: Secondary | ICD-10-CM

## 2018-09-30 DIAGNOSIS — Z9221 Personal history of antineoplastic chemotherapy: Secondary | ICD-10-CM | POA: Insufficient documentation

## 2018-09-30 DIAGNOSIS — E7889 Other lipoprotein metabolism disorders: Secondary | ICD-10-CM | POA: Diagnosis not present

## 2018-09-30 DIAGNOSIS — C20 Malignant neoplasm of rectum: Secondary | ICD-10-CM

## 2018-09-30 DIAGNOSIS — Z923 Personal history of irradiation: Secondary | ICD-10-CM | POA: Insufficient documentation

## 2018-09-30 DIAGNOSIS — F988 Other specified behavioral and emotional disorders with onset usually occurring in childhood and adolescence: Secondary | ICD-10-CM

## 2018-09-30 NOTE — Progress Notes (Signed)
ADHD/ ADD OV note  Impression and Recommendations:    1. Mixed hyperlipidemia   2. Elevated HDL:  >er 90   3. Squamous cell carcinoma of rectum (HCC)   4. Malignant neoplasm of female breast, unspecified estrogen receptor status, unspecified laterality, unspecified site of breast ([place])   5. 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 Depression, recurrent ([place])   7. Vitamin D insufficiency   8. Chemotherapy induced neutropenia (HCC)   9. Attention deficit disorder, unspecified hyperactivity presence   10. Personal history of radiation therapy   11. Personal history of chemotherapy   12. Essential hypertension     - Need for fasting lab work in near future.  1. Attention Deficit Disorder - Stable at this time.  - Continue treatment plan as recommended.  See med list. - Patient tolerating meds well without complication.  Denies S-E  - Discussed that Adderall can potentially increase blood pressure and increase risk of heart attack or stroke.  Patient understands the risks and benefits of Adderall use, and wishes to continue to be managed on treatment.  2. Depression, Mixed Disorder as Reaction to Stress - Stable at this time.   - Continue on management as prescribed. - Patient tolerating treatment plan without S-E.  - Reviewed the "spokes of the wheel" of mood and health management.  Stressed the importance of ongoing prudent habits, including regular exercise, appropriate sleep hygiene, healthful dietary habits, and prayer/meditation to relax.  3. Recent CT Scan through Oncology - Signs of CAD, Non-Aneurysmal Atherosclerosis  - Reviewed scan with patient today. - atherosclerosis reviewed in detail with patient today.  - Regarding pt's recent CT imaging, reviewed in great detail recent imaging notes.  IMPRESSION: 1. No evidence of metastatic disease in the chest, abdomen or pelvis. 2. Chronic findings include: Aortic Atherosclerosis (ICD10-I70.0). Three-vessel  coronary atherosclerosis. Small hiatal hernia.  - Per patient, her recent scan came back clean, without signs of cancer.  - Red flag cardiac symptoms reviewed at length with patient today.  Emphasized keeping an eye out for chest pain/ pressure, nausea, chest pain or tightness with exertion that is relieved by rest. Dial 911.  Pt had none such sx  - If she develops acute chest pain, patient understands to go to the emergency room.  - importance of primary/secondary prevention d/c pt of CAD/ cva etc.  - Encouraged patient to eat a prudent, Mediterranean diet.  Emphasized importance of controlling blood pressure (135/85 or less), controlling cholesterol- needs to take regularly and come in for bldwrk when told to etc, and exercising daily, to help improve dx risk important in addition to meds.   4. Cancer, Radiation Therapy, Chemotherapy - Oncology Follow-Up - Patient's treatment plan will continue to be managed by oncology. - Will continue to monitor.  5. Mixed Hyperlipidemia - Continue Lipitor at night. - Need for lab work in near future. - Continue treatment plan as recommended. - Patient tolerating meds well without complication.  Denies S-E - Will continue to monitor.  6. Lifestyle & Preventative Health Maintenance - Advised patient to continue working toward exercising to improve overall mental, physical, and emotional health.    - Health counseling performed.  All questions answered.  - Encouraged patient to engage in daily physical activity, especially a formal exercise routine.  Recommended that the patient eventually strive for at least 150 minutes of moderate cardiovascular activity per week according to guidelines established by the [facility].   - Healthy dietary  habits encouraged, including low-carb, and high amounts of lean protein in diet.   - Patient should also consume adequate amounts of water.   Education and routine counseling performed. Handouts provided if pt  desired.  Orders Placed This Encounter  Procedures  . CBC with Differential/Platelet  . Comprehensive metabolic panel  . Lipid panel  . Vitamin B12  . TSH  . T4, free  . T3, free  . Phosphorus  . Magnesium  . VITAMIN D 25 Hydroxy (Vit-D Deficiency, Fractures)    Orders Placed This Encounter  Procedures  . CBC with Differential/Platelet  . Comprehensive metabolic panel  . Lipid panel  . Vitamin B12  . TSH  . T4, free  . T3, free  . Phosphorus  . Magnesium  . VITAMIN D 25 Hydroxy (Vit-D Deficiency, Fractures)     Return for Medicare wellness, come fasting if desired blood work same day..  -Reminded patient the need for yearly complete physical exam office visits in addition to office visits for management of the chronic diseases  -Gross side effects, risk and benefits, and alternatives of medications discussed with patient.  Patient is aware that all medications have potential side effects and we are unable to predict every side effect or drug-drug interaction that may occur.  Expresses verbal understanding and consents to current therapy plan and treatment regimen.   Please see AVS handed out to patient at the end of our visit for further patient instructions/ counseling done pertaining to today's office visit.    Note:  This document was prepared using Dragon voice recognition software and may include unintentional dictation errors.  This document serves as a record of services personally performed by [name], DO. It was created on her behalf by [name], a trained medical scribe. The creation of this record is based on the scribe's personal observations and the provider's statements to them.   I have reviewed the above medical documentation for accuracy and completeness and I concur.  [name], DO 09/30/2018 9:02 PM       ______________________________________________________________________    Subjective:  HPI: Tiawana Stott89 y.o.  female  presents for 3 month follow up for evaluation of our treatment plan for pt's ADD/ ADHD.  She is not taking it every day.  "I only take it twice when I feel like I need it."    Most of the time, takes it once per day.  Says "I never take the second one unless I can't focus at all."  Notes she has plenty of the medication at home.  Largely denies chest tightness, SOB, jaw pain, or nausea on exertion.  Patient notes sometimes she experiences shortness of breath while walking.  This is not a brand new symptom for her.  "I'm still working on getting back."  Feels it's hard to say when her SOB symptoms came on, because there was so much other junk going on, but she is not alarmed by them.  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  Cholesterol Management - Patient reports so-so compliance with medications or treatment plan.  Patient notes that she often forgets to take her Lipitor at night.  She has a small glass of milk every night and keeps her pills near the milk.  - Denies medication S-E   - Smoking Status noted   - She denies new onset  of: chest pain, exercise intolerance, shortness of breath, dizziness, visual changes, headache, lower extremity swelling or claudication.   Denies myalgias  The cholesterol last visit was:  Lab Results  Component Value Date   CHOL 207 (H) 07/11/2017   HDL 67 07/11/2017   LDLCALC 124 (H) 07/11/2017   TRIG 79 07/11/2017   CHOLHDL 3.1 07/11/2017    Hepatic Function Latest Ref Rng & Units 07/31/2018 01/28/2018 06/11/2017  Total Protein 6.5 - 8.1 g/dL 7.2 6.8 6.8  Albumin 3.5 - 5.0 g/dL 4.1 4.1 3.7  AST 15 - 41 U/L _0 ALT 0 - 44 U/L _1 Alk Phosphatase 38 - 126 U/L 50 43 47  Total Bilirubin 0.3 - 1.2 mg/dL 0.3 0.3 0.30    Problem  Personal History of Radiation Therapy  Personal History of  Chemotherapy  Colon Cancer (Hcc)   rectum-squamous cell carcinoma   Breast Cancer (Hcc)   left side modified mastecomy      1. HTN HPI:  - Patient has not been taking her blood pressure at home. - Patient reports good compliance with blood pressure medications.  - Denies medication S-E   - Smoking Status noted   - She denies new onset of: chest pain, exercise intolerance, shortness of breath, dizziness, visual changes, headache, lower extremity swelling or claudication.   Last 3 blood pressure readings in our office are as follows: BP Readings from Last 3 Encounters:  09/30/18 139/83  09/03/18 (!) 149/101  06/26/18 128/76    Filed Weights   09/30/18 1347  Weight: 131 lb (59.4 kg)     Wt Readings from Last 3 Encounters:  09/30/18 131 lb (59.4 kg)  09/03/18 130 lb 9.6 oz (59.2 kg)  06/26/18 128 lb 11.2 oz (58.4 kg)    BMI Readings from Last 3 Encounters:  09/30/18 23.96 kg/m  09/03/18 23.89 kg/m  06/26/18 23.54 kg/m    BP Readings from Last 3 Encounters:  09/30/18 139/83  09/03/18 (!) 149/101  06/26/18 128/76     Review of Systems: General:   No F/C, wt loss Pulm:   No DIB, SOB, pleuritic chest pain Card:  No CP, palpitations Abd:  No n/v/d or pain Ext:  No inc edema from baseline   Objective: Physical Exam: BP 139/83   Pulse 79   Temp 98.3 F (36.8 C)   Ht _2  (1.575 m)   Wt 131 lb (59.4 kg)   SpO2 100%   BMI 23.96 kg/m  Body mass index is 23.96 kg/m. General: Well nourished, in no apparent distress. Eyes: PERRLA, EOMs, conjunctiva clr no swelling or erythema Neck: supple Resp: Respiratory effort- normal, ECTA B/L w/o W/R/R  Cardio: RRR w/o MRGs. Skin: Warm, dry without rashes, lesions, ecchymosis.  Neuro: Alert, Oriented Psych: Normal affect, Insight and Judgment appropriate.    Current Medications:  Current Outpatient Medications on File Prior to Visit  Medication Sig Dispense Refill  . amphetamine-dextroamphetamine  (ADDERALL) 10 MG tablet Take 1 tablet (10 mg total) by mouth 2 (two) times daily. 180 tablet 0  . atorvastatin (LIPITOR) 80 MG tablet Take 0.5 tablets (40 mg total) by mouth daily at 6 PM. 45 tablet 3  . cholecalciferol (VITAMIN D) 400 units TABS tablet Take 400 Units by mouth daily.    . hydrochlorothiazide (MICROZIDE) 12.5 MG capsule Take 1 capsule (12.5 mg total) by mouth daily. 90 capsule 0  . losartan (COZAAR) 50 MG tablet Take 1 tablet (50 mg total) by mouth  daily. 90 tablet 0  . sertraline (ZOLOFT) 50 MG tablet Take 1.5 tablets (75 mg total) by mouth daily. 135 tablet 0   Current Facility-Administered Medications on File Prior to Visit  Medication Dose Route Frequency Provider Last Rate Last Dose  . 0.9 %  sodium chloride infusion  500 mL Intravenous Continuous [name], MD        Medical History:  Patient Active Problem List   Diagnosis Date Noted  . Squamous cell carcinoma of rectum (HCC) 02/14/2017    Priority: High  . HLD (hyperlipidemia) 07/19/2016    Priority: High  . Elevated HDL:  >er 90 07/19/2016    Priority: High  . History of breast Cancer 05/10/2016    Priority: High  . HTN (hypertension), benign 05/10/2016    Priority: High  . 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 Depression, recurrent ([place]) 05/10/2016    Priority: Medium  . Chemotherapy induced neutropenia (HCC) 04/14/2017    Priority: Low  . Vitamin D insufficiency 07/19/2016    Priority: Low  . Degenerative joint disease (DJD) of sternoclavicular joint, right 07/19/2016    Priority: Low  . Pain in joint, shoulder region 05/10/2016    Priority: Low  . Personal history of radiation therapy 09/30/2018  . Personal history of chemotherapy 09/30/2018  . Anemia due to antineoplastic chemotherapy 05/14/2017  . Pneumonia of left lower lobe due to infectious organism ([place]) 05/09/2017  . Difficulty sleeping 05/09/2017  . Burning sensation- scalp esp at night 05/09/2017  .  Wheeze 05/09/2017  . Colon cancer ([place]) 02/11/2017  . Attention deficit disorder 12/27/2016  . Multiple atypical nevi 12/27/2016  . Nonspecific chest pain 08/08/2016  . Retrosternal chest pain 07/19/2016  . Cataract 05/10/2016  . Breast cancer ([place]) 03/13/2000    Allergies:  Allergies  Allergen Reactions  . Ibuprofen Rash     Family history-  Reviewed; changed as appropriate  Social history-  Reviewed; changed as appropriate

## 2018-09-30 NOTE — Patient Instructions (Signed)
Come in for bldwrk- fasting near future and if ABN, we will meet to discuss.  If preferred you can come in for medicare wellness and get bldwrk at same time/ day  Atherosclerosis  Atherosclerosis is narrowing and hardening of the arteries. Arteries are blood vessels that carry blood from the heart to all parts of the body. This blood contains oxygen. Arteries can become narrow or clogged with a buildup of fat, cholesterol, calcium, and other substances (plaque). Plaque decreases the amount of blood that can flow through the artery. Atherosclerosis can affect any artery in the body, including:  Heart arteries (coronary artery disease). This may cause a heart attack.  Brain arteries. This may cause a stroke (cerebrovascular accident).  Leg, arm, and pelvis arteries (peripheral artery disease). This may cause pain and numbness.  Kidney arteries. This may cause kidney (renal) failure. Treatment may slow the disease and prevent further damage to the heart, brain, peripheral arteries, and kidneys. What are the causes? Atherosclerosis develops slowly over many years. The inner layers of your arteries become damaged and allow the gradual buildup of plaque. The exact cause of atherosclerosis is not fully understood. Symptoms of atherosclerosis do not occur until the artery becomes narrow or blocked. What increases the risk? The following factors may make you more likely to develop this condition:  High blood pressure.  High cholesterol.  Being middle-aged or older.  Having a family history of atherosclerosis.  Having high blood fats (triglycerides).  Diabetes.  Being overweight.  Smoking tobacco.  Not exercising enough (sedentary lifestyle).  Having a substance in the blood called C-reactive protein (CRP). This is a sign of increased levels of inflammation in the body.  Sleep apnea.  Being stressed.  Drinking too much alcohol. What are the signs or symptoms? This condition may  not cause any symptoms. If you have symptoms, they are caused by damage to an area of your body that is not getting enough blood.  Coronary artery disease may cause chest pain and shortness of breath.  Decreased blood supply to your brain may cause a stroke. Signs of a stroke may include sudden: ? Weakness on one side of the body. ? Confusion. ? Changes in vision. ? Inability to speak or understand speech. ? Loss of balance, coordination, or the ability to walk. ? Severe headache. ? Loss of consciousness.  Peripheral arterial disease may cause pain and numbness, often in the legs and hips.  Renal failure may cause fatigue, nausea, swelling, and itchy skin. How is this diagnosed? This condition is diagnosed based on your medical history and a physical exam. During the exam:  Your health care provider will: ? Check your pulse in different places. ? Listen for a "whooshing" sound over your arteries (bruit).  You may have tests, such as: ? Blood tests to check your levels of cholesterol, triglycerides, and CRP. ? Electrocardiogram (ECG) to check for heart damage. ? Chest X-ray to see if you have an enlarged heart, which is a sign of heart failure. ? Stress test to see how your heart reacts to exercise. ? Echocardiogram to get images of the inside of your heart. ? Ankle-brachial index to compare blood pressure in your arms to blood pressure in your ankles. ? Ultrasound of your peripheral arteries to check blood flow. ? CT scan to check for damage to your heart or brain. ? X-rays of blood vessels after dye has been injected (angiogram) to check blood flow. How is this treated? Treatment starts with lifestyle  changes, which may include:  Changing your diet.  Losing weight.  Reducing stress.  Exercising and being physically active more regularly.  Not smoking. You may also need medicine to:  Lower triglycerides and cholesterol.  Control blood pressure.  Prevent blood  clots.  Lower inflammation in your body.  Control your blood sugar. Sometimes, surgery is needed to:  Remove plaque from an artery (endarterectomy).  Open or widen a narrowed heart artery (angioplasty).  Create a new path for your blood with one of these procedures: ? Heart (coronary) artery bypass graft surgery. ? Peripheral artery bypass graft surgery. Follow these instructions at home: Eating and drinking   Eat a heart-healthy diet. Talk with your health care provider or a diet and nutrition specialist (dietitian) if you need help. A heart-healthy diet involves: ? Limiting unhealthy fats and increasing healthy fats. Some examples of healthy fats are olive oil and canola oil. ? Eating plant-based foods, such as fruits, vegetables, nuts, whole grains, and legumes (such as peas and lentils).  Limit alcohol intake to no more than 1 drink a day for nonpregnant women and 2 drinks a day for men. One drink equals 12 oz of beer, 5 oz of wine, or 1 oz of hard liquor. Lifestyle  Follow an exercise program as told by your health care provider.  Maintain a healthy weight. Lose weight if your health care provider says that you need to do that.  Rest when you are tired.  Learn to manage your stress.  Do not use any products that contain nicotine or tobacco, such as cigarettes and e-cigarettes. If you need help quitting, ask your health care provider.  Do not abuse drugs. General instructions  Take over-the-counter and prescription medicines only as told by your health care provider.  Manage other health conditions as told by your health care provider.  Keep all follow-up visits as told by your health care provider. This is important. Contact a health care provider if:  You have chest pain or discomfort. This includes squeezing chest pain that may feel like indigestion (angina).  You have shortness of breath.  You have an irregular heartbeat.  You have unexplained  fatigue.  You have unexplained pain or numbness in an arm, leg, or hip.  You have nausea, swelling of your hands or feet, and itchy skin. Get help right away if:  You have any symptoms of a heart attack, such as: ? Chest pain. ? Shortness of breath. ? Pain in your neck, jaw, arms, back, or stomach. ? Cold sweat. ? Nausea. ? Light-headedness.  You have any symptoms of a stroke. "BE FAST" is an easy way to remember the main warning signs of a stroke: ? B - Balance. Signs are dizziness, sudden trouble walking, or loss of balance. ? E - Eyes. Signs are trouble seeing or a sudden change in vision. ? F - Face. Signs are sudden weakness or numbness of the face, or the face or eyelid drooping on one side. ? A - Arms. Signs are weakness or numbness in an arm. This happens suddenly and usually on one side of the body. ? S - Speech. Signs are sudden trouble speaking, slurred speech, or trouble understanding what people say. ? T - Time. Time to call emergency services. Write down what time symptoms started.  You have other signs of a stroke, such as: ? A sudden, severe headache with no known cause. ? Nausea or vomiting. ? Seizure. These symptoms may represent a serious problem  that is an emergency. Do not wait to see if the symptoms will go away. Get medical help right away. Call your local emergency services (911 in the [place] Do not drive yourself to the hospital. Summary  Atherosclerosis is narrowing and hardening of the arteries.  Arteries can become narrow or clogged with a buildup of fat, cholesterol, calcium, and other substances (plaque).  This condition may not cause any symptoms. If you do have symptoms, they are caused by damage to an area of your body that is not getting enough blood.  Treatment may include lifestyle changes and medicines. In some cases, surgery is needed. This information is not intended to replace advice given to you by your health care provider. Make sure you  discuss any questions you have with your health care provider. Document Released: 10/20/2003 Document Revised: 11/08/2017 Document Reviewed: 04/04/2017 Elsevier Interactive Patient Education  2019 [place] for a Low Cholesterol, Low Saturated Fat Diet   Fats - Limit total intake of fats and oils. - Avoid butter, stick margarine, shortening, lard, palm and coconut oils. - Limit mayonnaise, salad dressings, gravies and sauces, unless they are homemade with low-fat ingredients. - Limit chocolate. - Choose low-fat and nonfat products, such as low-fat mayonnaise, low-fat or non-hydrogenated peanut butter, low-fat or fat-free salad dressings and nonfat gravy. - Use vegetable oil, such as canola or olive oil. - Look for margarine that does not contain trans fatty acids. - Use nuts in moderate amounts. - Read ingredient labels carefully to determine both amount and type of fat present in foods. Limit saturated and trans fats! - Avoid high-fat processed and convenience foods.  Meats and Meat Alternatives - Choose fish, chicken, [place] and lean meats. - Use dried beans, peas, lentils and tofu. - Limit egg yolks to three to four per week. - If you eat red meat, limit to no more than three servings per week and choose loin or round cuts. - Avoid fatty meats, such as bacon, sausage, franks, luncheon meats and ribs. - Avoid all organ meats, including liver.  Dairy - Choose nonfat or low-fat milk, yogurt and cottage cheese. - Most cheeses are high in fat. Choose cheeses made from non-fat milk, such as mozzarella and ricotta cheese. - Choose light or fat-free cream cheese and sour cream. - Avoid cream and sauces made with cream.  Fruits and Vegetables - Eat a wide variety of fruits and vegetables. - Use lemon juice, vinegar or "mist" olive oil on vegetables. - Avoid adding sauces, fat or oil to vegetables.  Breads, Cereals and Grains - Choose whole-grain breads,  cereals, pastas and rice. - Avoid high-fat snack foods, such as granola, cookies, pies, pastries, doughnuts and croissants.  Cooking Tips - Avoid deep fried foods. - Trim visible fat off meats and remove skin from poultry before cooking. - Bake, broil, boil, poach or roast poultry, fish and lean meats. - Drain and discard fat that drains out of meat as you cook it. - Add little or no fat to foods. - Use vegetable oil sprays to grease pans for cooking or baking. - Steam vegetables. - Use herbs or no-oil marinades to flavor foods.

## 2018-11-05 ENCOUNTER — Other Ambulatory Visit: Payer: Self-pay | Admitting: Family Medicine

## 2018-11-05 DIAGNOSIS — I10 Essential (primary) hypertension: Secondary | ICD-10-CM

## 2018-11-12 ENCOUNTER — Telehealth: Payer: Self-pay | Admitting: *Deleted

## 2018-11-12 NOTE — Telephone Encounter (Signed)
CALLED PATIENT TO  ALTER FU APPT. FOR 12-02-18 DUE TO COVID 19, RESCHEDULED FOR 01-12-19 @ 2 PM, LVM FOR A RETURN CALL

## 2018-11-19 ENCOUNTER — Telehealth: Payer: Self-pay | Admitting: Family Medicine

## 2018-11-19 ENCOUNTER — Other Ambulatory Visit: Payer: Self-pay

## 2018-11-19 DIAGNOSIS — E782 Mixed hyperlipidemia: Secondary | ICD-10-CM

## 2018-11-19 MED ORDER — ATORVASTATIN CALCIUM 80 MG PO TABS: 40.0000 mg | ORAL_TABLET | Freq: Every day | ORAL | 1 refills | Status: DC

## 2018-11-19 NOTE — Telephone Encounter (Signed)
Patient states she 1st called [facility] for refill on : (they advised no Rx on file)     atorvastatin (LIPITOR) 80 MG tablet [[id number]]   Order Details  Dose: 40 mg Route: Oral Frequency: Daily-1800  Dispense Quantity: 45 tablet Refills: 3 Fills remaining: --        Sig: Take 0.5 tablets (40 mg total) by mouth daily at 6 PM.          ---Forwarding request to medical assistant for review/approval & order for refill to be sent to :   Preferred Pharmacies      [place], [place] [phone] (Phone) [phone] (Fax)   If any questions please call patient.  --glh

## 2018-11-19 NOTE — Telephone Encounter (Signed)
Sent in refill and notified patient. MPulliam, CMA/RT(R)

## 2018-12-02 ENCOUNTER — Ambulatory Visit: Payer: Self-pay | Admitting: Radiation Oncology

## 2018-12-15 ENCOUNTER — Encounter: Payer: Self-pay | Admitting: Family Medicine

## 2018-12-15 ENCOUNTER — Ambulatory Visit (INDEPENDENT_AMBULATORY_CARE_PROVIDER_SITE_OTHER): Payer: Medicare Other | Admitting: Family Medicine

## 2018-12-15 ENCOUNTER — Other Ambulatory Visit: Payer: Self-pay

## 2018-12-15 VITALS — BP 125/72 | HR 79 | Temp 97.3°F | Ht 62.0 in | Wt 131.0 lb

## 2018-12-15 DIAGNOSIS — Z0000 Encounter for general adult medical examination without abnormal findings: Secondary | ICD-10-CM | POA: Diagnosis not present

## 2018-12-15 DIAGNOSIS — E2839 Other primary ovarian failure: Secondary | ICD-10-CM | POA: Diagnosis not present

## 2018-12-15 NOTE — Progress Notes (Addendum)
Telehealth office visit note for Denise Wolfe, D.O- at Primary Care at [facility]   I connected with current patient today and verified that I am speaking with the correct person using two identifiers.   . Location of the patient: Home . Location of the provider: Office Only the patient (+/- their family members at pt's discretion) and myself were participating in the encounter    - This visit type was conducted due to national recommendations for restrictions regarding the COVID-19 Pandemic (e.g. social distancing) in an effort to limit this patient's exposure and mitigate transmission in our community.  This format is felt to be most appropriate for this patient at this time.   - The patient did not have access to video technology or had technical difficulties with video requiring transitioning to audio format only. - No physical exam could be performed with this format, beyond that communicated to [place] by the patient/ family members as noted.   - Additionally my office staff/ schedulers discussed with the patient that there may be a monetary charge related to this service, depending on their medical insurance.   The patient expressed understanding, and agreed to proceed.    Subjective:   Denise Wolfe is a 78 y.o. female who presents for Medicare Annual (Subsequent) preventive examination.  -She has concerns about COVID today.  She is worried because of her history of breast cancer as well as colon cancer and being exposed to anyone.  Patient does not want to even go for mammogram etc. at this time.  Counseling was performed with patient       Objective:     Vitals: BP 125/72   Pulse 79   Temp (!) 97.3 F (36.3 C)   Ht 5\' 2"  (1.575 m)   Wt 131 lb (59.4 kg)   BMI 23.96 kg/m   Body mass index is 23.96 kg/m.  Advanced Directives 11/28/2017 07/23/2017 05/14/2017 04/27/2017 04/16/2017 03/25/2017 02/22/2017  Does Patient Have a Medical Advance Directive? No No No Yes No No No  Would patient  like information on creating a medical advance directive? - - - No - Patient declined No - Patient declined - No - Patient declined    Tobacco Social History   Tobacco Use  Smoking Status Never Smoker  Smokeless Tobacco Never Used     Counseling given: Not Answered   Clinical Intake:                       Past Medical History:  Diagnosis Date  . Anemia    during childhood  . Arthritis    hands, shoulder  . Breast cancer ([place]) 03/2000   left side modified mastecomy  . Cancer [facility])    breast, age 64; left side  . Colon cancer ([place]) 02/11/2017   rectum-squamous cell carcinoma  . Depression   . Hypertension   . Personal history of chemotherapy   . Personal history of radiation therapy   . Squamous cell carcinoma of rectum ([place]) 02/14/2017   Past Surgical History:  Procedure Laterality Date  . ABDOMINAL HYSTERECTOMY     With bilateral oophorectomy. 2002.  Marland Kitchen BREAST BIOPSY Right 2004   benign  . BREAST SURGERY Left 2001   biopsy  . CATARACT EXTRACTION Bilateral 2017  . IR FLUORO GUIDE CV LINE RIGHT  03/11/2017  . IR FLUORO GUIDE CV LINE RIGHT  04/16/2017  . IR US GUIDE VASC ACCESS RIGHT  03/11/2017  . IR US GUIDE VASC  ACCESS RIGHT  04/16/2017  . MASTECTOMY MODIFIED RADICAL Left 2001  . MENISCUS DEBRIDEMENT Right 2003  . TUMOR EXCISION Right    thigh area   Family History  Problem Relation Age of Onset  . Cancer Mother 36       breast  . Breast cancer Mother   . Heart disease Father   . Colon cancer Neg Hx   . Esophageal cancer Neg Hx   . Rectal cancer Neg Hx   . Stomach cancer Neg Hx    Social History   Socioeconomic History  . Marital status: Widowed    Spouse name: Not on file  . Number of children: Not on file  . Years of education: Not on file  . Highest education level: Not on file  Occupational History  . Not on file  Social Needs  . Financial resource strain: Not on file  . Food insecurity:    Worry: Not on file    Inability: Not on  file  . Transportation needs:    Medical: Not on file    Non-medical: Not on file  Tobacco Use  . Smoking status: Never Smoker  . Smokeless tobacco: Never Used  Substance and Sexual Activity  . Alcohol use: Yes    Alcohol/week: 14.0 standard drinks    Types: 14 Glasses of wine per week  . Drug use: No  . Sexual activity: Not Currently  Lifestyle  . Physical activity:    Days per week: Not on file    Minutes per session: Not on file  . Stress: Not on file  Relationships  . Social connections:    Talks on phone: Not on file    Gets together: Not on file    Attends religious service: Not on file    Active member of club or organization: Not on file    Attends meetings of clubs or organizations: Not on file    Relationship status: Not on file  Other Topics Concern  . Not on file  Social History Narrative  . Not on file    Outpatient Encounter Medications as of 12/15/2018  Medication Sig  . amphetamine-dextroamphetamine (ADDERALL) 10 MG tablet Take 1 tablet (10 mg total) by mouth 2 (two) times daily.  Marland Kitchen atorvastatin (LIPITOR) 80 MG tablet Take 0.5 tablets (40 mg total) by mouth daily at 6 PM.  . cholecalciferol (VITAMIN D) 400 units TABS tablet Take 400 Units by mouth daily. Patient is not sure of the units.  . hydrochlorothiazide (MICROZIDE) 12.5 MG capsule TAKE 1 CAPSULE BY MOUTH  DAILY  . losartan (COZAAR) 50 MG tablet TAKE 1 TABLET BY MOUTH  DAILY  . sertraline (ZOLOFT) 50 MG tablet Take 1.5 tablets (75 mg total) by mouth daily.   Facility-Administered Encounter Medications as of 12/15/2018  Medication  . 0.9 %  sodium chloride infusion    Activities of Daily Living In your present state of health, do you have any difficulty performing the following activities: 12/15/2018  Hearing? N  Vision? N  Difficulty concentrating or making decisions? N  Walking or climbing stairs? N  Dressing or bathing? N  Doing errands, shopping? N  Some recent data might be hidden      Activities of Daily Living In your present state of health, do you have difficulty performing the following activities?  1- Driving - no 2- Managing money - no 3- Feeding yourself - no 4- Getting from the bed to the chair - no 5- Climbing a flight  of stairs - no 6- Preparing food and eating - no 7- Bathing or showering - no 8- Getting dressed - no 9- Getting to the toilet - no 10- Using the toilet - no 11- Moving around from place to place - no  Patient states that she feels safe at home.    Patient Care Team: Denise Dance, DO as PCP - General (Family Medicine) Silverio Decamp, MD as Consulting Physician (Family Medicine) Truitt Merle, MD as Consulting Physician (Hematology) Leighton Ruff, MD as Consulting Physician (General Surgery)    Assessment:   This is a routine wellness examination for Makaila.  Exercise Activities and Dietary recommendations Current Exercise Habits: The patient does not participate in regular exercise at present(Patient is activie daily )  Goals   None     Fall Risk Fall Risk  12/15/2018 11/28/2017 10/24/2017 05/23/2017 05/09/2017  Falls in the past year? 0 No No No No  Follow up Falls evaluation completed - - - -   Is the patient's home free of loose throw rugs in walkways, pet beds, electrical cords, etc?   yes      Grab bars in the bathroom? no      Handrails on the stairs?   yes      Adequate lighting?   yes  Timed Get Up and Go performed:  WNL's   Depression Screen PHQ 2/9 Scores 12/15/2018 09/30/2018 06/26/2018 02/06/2018  PHQ - 2 Score 0 2 0 2  PHQ- 9 Score 3 6 3 6     Depression screen [facility] 2/9 12/15/2018 09/30/2018 06/26/2018 02/06/2018 11/28/2017  Decreased Interest 0 1 0 1 0  Down, Depressed, Hopeless 0 1 0 1 0  PHQ - 2 Score 0 2 0 2 0  Altered sleeping 0 1 1 1  0  Tired, decreased energy 0 1 1 1  0  Change in appetite 0 0 0 0 0  Feeling bad or failure about yourself  0 0 0 0 0  Trouble concentrating 3 2 1 2  0  Moving slowly  or fidgety/restless 0 0 0 0 0  Suicidal thoughts 0 0 0 0 0  PHQ-9 Score 3 6 3 6  0  Difficult doing work/chores Not difficult at all Not difficult at all - Somewhat difficult -  Some recent data might be hidden    Cognitive Function  6CIT Screen 12/15/2018  What Year? 0 points  What month? 0 points  What time? 0 points  Count back from 20 0 points  Months in reverse 0 points  Repeat phrase 0 points  Total Score 0    Immunization History  Administered Date(s) Administered  . Influenza, High Dose Seasonal PF 07/23/2017, 06/26/2018  . Pneumococcal Conjugate-13 12/27/2016  . Pneumococcal Polysaccharide-23 01/19/2009  . Td 05/17/2004    Qualifies for Shingles Vaccine?  Information sent to the patient.    Screening Tests Health Maintenance  Topic Date Due  . TETANUS/TDAP  10/01/2019 (Originally 05/17/2014)  . INFLUENZA VACCINE  03/14/2019  . COLONOSCOPY  02/12/2027  . DEXA SCAN  Completed  . PNA vac Low Risk Adult  Completed    Cancer Screenings: Lung: Low Dose CT Chest recommended if Age 77-80 years, 30 pack-year currently smoking OR have quit w/in 15years. Patient does not qualify. Breast:  Up to date on Mammogram? Yes   Up to date of Bone Density/Dexa? No Colorectal: 02/11/2017  Additional Screenings: Patient declines hepatitis C screen or HIV.     Plan:   Patient declines coming into the  office in the near future for her fasting blood work which she needs since she has had no lipid panel for over 2 years.   Also she declines tetanus as well as zoster vaccine at this time.  I directed her to the CDC website to read about the Shingrix vaccine and why she needs to update her tetanus as well. -Patient tells me she will return to our clinic in a couple of months once the [place] is over and obtain these injections.  I reminded patient we will not be calling her to remind her of this so please put it on her calendar.   I have personally reviewed and noted the following  in the patient's chart:   . Medical and social history . Use of alcohol, tobacco or illicit drugs  . Current medications and supplements . Functional ability and status . Nutritional status . Physical activity . Advanced directives . List of other physicians . Hospitalizations, surgeries, and ER visits in previous 12 months . Vitals . Screenings to include cognitive, depression, and falls . Referrals and appointments  In addition, I have reviewed and discussed with patient certain preventive protocols, quality metrics, and best practice recommendations. A written personalized care plan for preventive services as well as general preventive health recommendations were provided to patient.     Jerilee Field, [place]  12/15/2018

## 2018-12-30 ENCOUNTER — Telehealth: Payer: Self-pay | Admitting: *Deleted

## 2018-12-30 NOTE — Telephone Encounter (Signed)
Called patient to ask about rescheduling fu for 01-12-19 per provider request, lvm for a return call

## 2018-12-31 ENCOUNTER — Telehealth: Payer: Self-pay | Admitting: Hematology

## 2018-12-31 NOTE — Telephone Encounter (Signed)
Called patient regarding upcoming Webex appointment, per patient's request appt has been moved to June. Patient would prefer this appointment to be a walk in visit.  Message to provider.

## 2019-01-02 ENCOUNTER — Ambulatory Visit: Payer: Medicare Other | Admitting: Hematology

## 2019-01-02 ENCOUNTER — Other Ambulatory Visit: Payer: Medicare Other

## 2019-01-12 ENCOUNTER — Ambulatory Visit: Payer: Medicare Other | Admitting: Radiation Oncology

## 2019-01-28 ENCOUNTER — Ambulatory Visit (INDEPENDENT_AMBULATORY_CARE_PROVIDER_SITE_OTHER): Payer: Medicare Other | Admitting: Family Medicine

## 2019-01-28 ENCOUNTER — Other Ambulatory Visit: Payer: Self-pay

## 2019-01-28 ENCOUNTER — Encounter: Payer: Self-pay | Admitting: Family Medicine

## 2019-01-28 VITALS — BP 114/72 | HR 72 | Temp 97.1°F | Ht 62.0 in | Wt 130.5 lb

## 2019-01-28 DIAGNOSIS — C801 Malignant (primary) neoplasm, unspecified: Secondary | ICD-10-CM

## 2019-01-28 DIAGNOSIS — C20 Malignant neoplasm of rectum: Secondary | ICD-10-CM | POA: Diagnosis not present

## 2019-01-28 DIAGNOSIS — F339 Major depressive disorder, recurrent, unspecified: Secondary | ICD-10-CM | POA: Diagnosis not present

## 2019-01-28 DIAGNOSIS — E782 Mixed hyperlipidemia: Secondary | ICD-10-CM

## 2019-01-28 DIAGNOSIS — F988 Other specified behavioral and emotional disorders with onset usually occurring in childhood and adolescence: Secondary | ICD-10-CM

## 2019-01-28 DIAGNOSIS — I10 Essential (primary) hypertension: Secondary | ICD-10-CM | POA: Diagnosis not present

## 2019-01-28 MED ORDER — LOSARTAN POTASSIUM 50 MG PO TABS: 25.0000 mg | ORAL_TABLET | Freq: Every day | ORAL | 0 refills | Status: DC

## 2019-01-28 MED ORDER — AMPHETAMINE-DEXTROAMPHETAMINE 10 MG PO TABS: 10.0000 mg | ORAL_TABLET | ORAL | 0 refills | Status: DC

## 2019-01-28 NOTE — Progress Notes (Signed)
Virtual / live video office visit note for [employer], D.O- Primary Care Physician at [facility]   I connected with current patient today and beyond visually recognizing the correct individual, I verified that I am speaking with the correct person using two identifiers.  . Location of the patient: Home . Location of the provider: Office Only the patient (+/- their family members at pt's discretion) and myself were participating in the encounter    - This visit type was conducted due to national recommendations for restrictions regarding the COVID-19 Pandemic (e.g. social distancing) in an effort to limit this patient's exposure and mitigate transmission in our community.  This format is felt to be most appropriate for this patient at this time.   - The patient did have access to video technology today    - No physical exam could be performed with this format, beyond that communicated to [place] by the patient/ family members as noted.   - Additionally my office staff/ schedulers discussed with the patient that there may be a monetary charge related to this service, depending on patient's medical insurance.   The patient expressed understanding, and agreed to proceed.      History of Present Illness: - pt here today for ADD f/up:   Mood: has been really good.lately- getting outside  Covid: pt is doing her own grocery shopping- wearing mask  ADD: :  Working well, tol well. Sleeping well.   Only takin g1 tab daily- rarely takes two.   Chol:  Just restarted her chol meds ealry April-she did decided to go off them on her own for some time.  Last prescription was given 07/2016 and the next prescription was given for of 2020.   Patient tries to eat well and is pretty active in her garden etc.  With her history of breast cancer as well as recently squamous cell carcinoma of rectum, she did not feel well on those medicines and stopped them.  HTN:  bp good at home 120's- 130's / 70's.   114/72. Maybe a little more dizzy-feeling if gets up to quick.     Impression and Recommendations:    1. Depression, recurrent ([place])   2. Attention deficit disorder, unspecified hyperactivity presence   3. HTN (hypertension), benign   4. Mixed hyperlipidemia   5. History of breast Cancer   6. Squamous cell carcinoma of rectum (HCC)     1. Depression, recurrent [facility]) Patient will continue current medicine for now.  She is doing well on this.  Continue current dose of Zoloft without change.  2. Attention deficit disorder, unspecified hyperactivity presence -Since patient is only taking 1 tablet daily on average, we will give her prescription reflecting this.  She is not taking her afternoon dose and is been 6 months since last visit since she had enough to get her through. -Electronically sent to [place] which is cheapest for patient. - amphetamine-dextroamphetamine (ADDERALL) 10 MG tablet; Take 1 tablet (10 mg total) by mouth every morning.  Dispense: 90 tablet; Refill: 0  3. HTN (hypertension), benign Since she is feeling little dizzy with getting up too quickly quick movements, we will decrease losartan from 50 mg daily to 25 mg daily. -Keep home BP log and monitor regularly.  Especially take blood pressure feels lightheaded or dizzy. -Follow-up 3 months to see how this change is doing or sooner if problems or concerns. - losartan (COZAAR) 50 MG tablet; Take 0.5 tablets (  25 mg total) by mouth daily.  Dispense: 45 tablet; Refill: 0  4. Mixed hyperlipidemia For cholesterol- patient has only been on her statin for 6 weeks now.  She restarted it and has not been on it since early 2018. -Once patient is on her statin for 4 to 6 months we will recheck.  Roughly around October 2020.  She will also be due for a CMP and other blood work at that time  5. History of breast Cancer 6. Squamous cell carcinoma of rectum (HCC) Treatment per her heme-onc doc. -She has not been going for  blood work at their office due to [employer] etc.  - Novel Covid -19 counseling done; all questions were answered.   - Current CDC / federal and [place] guidelines reviewed with patient  - Reminded pt of extreme importance of social distancing; wearing a mask when out in public; insensate handwashing and cleaning of surfaces, avoiding unnecessary trips for shopping and avoiding ALL but emergency appts etc. - Told patient to be prepared, not scared; and be smart for the sake of others - Patient will call with any additional concerns   - As part of my medical decision making, I reviewed the following data within the [place] History obtained from pt /family, CMA notes reviewed and incorporated if applicable, Labs reviewed, Radiograph/ tests reviewed if applicable and OV notes from prior OV's with me, as well as other specialists she/he has seen since seeing me last, were all reviewed and used in my medical decision making process today.   - Additionally, discussion had with patient regarding txmnt plan, their biases about that plan etc were used in my medical decision making today.   - The patient agreed with the plan and demonstrated an understanding of the instructions.   No barriers to understanding were identified.   - Red flag symptoms and signs discussed in detail.  Patient expressed understanding regarding what to do in case of emergency\ urgent symptoms.  The patient was advised to call back or seek an in-person evaluation if the symptoms worsen or if the condition fails to improve as anticipated.   Return for 2) 3 mo doxy for BP- dec losartan; ADD-dose change; Chol- will need labs; Mood etc.    Meds ordered this encounter  Medications  . amphetamine-dextroamphetamine (ADDERALL) 10 MG tablet    Sig: Take 1 tablet (10 mg total) by mouth every morning.    Dispense:  90 tablet    Refill:  0  . losartan (COZAAR) 50 MG tablet    Sig: Take 0.5 tablets (25 mg total) by mouth  daily.    Dispense:  45 tablet    Refill:  0    Medications Discontinued During This Encounter  Medication Reason  . amphetamine-dextroamphetamine (ADDERALL) 10 MG tablet Reorder  . losartan (COZAAR) 50 MG tablet       I provided 20+ minutes of non-face-to-face time during this encounter,with over 50% of the time in direct counseling on patients medical conditions/ medical concerns.  Additional time was spent with charting and coordination of care after the actual visit commenced.   Note:  This note was prepared with assistance of Dragon voice recognition software. Occasional wrong-word or sound-a-like substitutions may have occurred due to the inherent limitations of voice recognition software.  [name], DO     Patient Care Team    Relationship Specialty Notifications Start End  [name], DO PCP - General Family Medicine  05/10/16  Silverio Decamp, MD Consulting Physician Family Medicine  07/25/16    Comment: OA - shoulder injeciton  Truitt Merle, MD Consulting Physician Hematology  79/89/21   Leighton Ruff, MD Consulting Physician General Surgery  07/23/17     -Vitals obtained; medications/ allergies reconciled;  personal medical, social, Sx etc.histories were updated by CMA, reviewed by me and are reflected in chart  Patient Active Problem List   Diagnosis Date Noted  . Squamous cell carcinoma of rectum (HCC) 02/14/2017    Priority: High  . HLD (hyperlipidemia) 07/19/2016    Priority: High  . Elevated HDL:  >er 90 07/19/2016    Priority: High  . History of breast Cancer 05/10/2016    Priority: High  . HTN (hypertension), benign 05/10/2016    Priority: High  . Breast cancer ([place]) 03/13/2000    Priority: High  . 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 Depression, recurrent ([place]) 05/10/2016    Priority: Medium  . Chemotherapy induced neutropenia (HCC) 04/14/2017    Priority: Low  . Vitamin D insufficiency  07/19/2016    Priority: Low  . Degenerative joint disease (DJD) of sternoclavicular joint, right 07/19/2016    Priority: Low  . Pain in joint, shoulder region 05/10/2016    Priority: Low  . Personal history of radiation therapy 09/30/2018  . Personal history of chemotherapy 09/30/2018  . Anemia due to antineoplastic chemotherapy 05/14/2017  . Pneumonia of left lower lobe due to infectious organism ([place]) 05/09/2017  . Difficulty sleeping 05/09/2017  . Burning sensation- scalp esp at night 05/09/2017  . Wheeze 05/09/2017  . Colon cancer ([place]) 02/11/2017  . Attention deficit disorder 12/27/2016  . Multiple atypical nevi 12/27/2016  . Nonspecific chest pain 08/08/2016  . Retrosternal chest pain 07/19/2016  . Cataract 05/10/2016     Current Meds  Medication Sig  . amphetamine-dextroamphetamine (ADDERALL) 10 MG tablet Take 1 tablet (10 mg total) by mouth every morning.  Marland Kitchen atorvastatin (LIPITOR) 80 MG tablet Take 0.5 tablets (40 mg total) by mouth daily at 6 PM.  . cholecalciferol (VITAMIN D) 400 units TABS tablet Take 400 Units by mouth daily. Patient is not sure of the units.  . hydrochlorothiazide (MICROZIDE) 12.5 MG capsule TAKE 1 CAPSULE BY MOUTH  DAILY  . losartan (COZAAR) 50 MG tablet Take 0.5 tablets (25 mg total) by mouth daily.  . sertraline (ZOLOFT) 50 MG tablet Take 1.5 tablets (75 mg total) by mouth daily.  . [DISCONTINUED] amphetamine-dextroamphetamine (ADDERALL) 10 MG tablet Take 1 tablet (10 mg total) by mouth 2 (two) times daily.  . [DISCONTINUED] losartan (COZAAR) 50 MG tablet TAKE 1 TABLET BY MOUTH  DAILY   Current Facility-Administered Medications for the 01/28/19 encounter (Office Visit) with [name], DO  Medication  . 0.9 %  sodium chloride infusion     Allergies  Allergen Reactions  . Ibuprofen Rash     ROS:  See above HPI for pertinent positives and negatives   Objective:   Blood pressure 114/72, pulse 72, temperature (!) 97.1 F (36.2 C),  height 5\' 2"  (1.575 m), weight 130 lb 8 oz (59.2 kg).  (if some vitals are omitted, this means that patient was UNABLE to obtain them even though they were asked to get them prior to OV today.  They were asked to call us at their earliest convenience with these once obtained.)  General: A & O * 3; visually in no acute distress; in usual state of health.  Skin: Visible skin appears normal and pt's usual skin color HEENT:  EOMI, head is normocephalic and atraumatic.  Sclera are anicteric. Neck has a good range of motion.  Lips are noncyanotic Chest: normal chest excursion and movement Respiratory: speaking in full sentences, no conversational dyspnea; no use of accessory muscles Psych: insight good, mood- appears full

## 2019-01-30 ENCOUNTER — Telehealth: Payer: Self-pay

## 2019-01-30 ENCOUNTER — Other Ambulatory Visit: Payer: Self-pay | Admitting: Family Medicine

## 2019-01-30 DIAGNOSIS — I10 Essential (primary) hypertension: Secondary | ICD-10-CM

## 2019-01-30 NOTE — Telephone Encounter (Signed)
Called and left voicemail regarding pre-screening questions for appt on 6/22  

## 2019-02-02 ENCOUNTER — Ambulatory Visit: Payer: Medicare Other | Admitting: Hematology

## 2019-02-05 ENCOUNTER — Telehealth: Payer: Self-pay | Admitting: Hematology

## 2019-02-05 NOTE — Telephone Encounter (Signed)
Called pt per 6/25 sch message - unable to reach pt to set up appt - left message for patient to call back .

## 2019-02-11 ENCOUNTER — Other Ambulatory Visit: Payer: Self-pay | Admitting: Family Medicine

## 2019-02-11 DIAGNOSIS — I10 Essential (primary) hypertension: Secondary | ICD-10-CM

## 2019-03-09 ENCOUNTER — Ambulatory Visit
Admission: RE | Admit: 2019-03-09 | Discharge: 2019-03-09 | Disposition: A | Discharge status: Home / Self Care | Payer: Medicare Other | Source: Ambulatory Visit | Attending: Radiation Oncology | Admitting: Radiation Oncology

## 2019-03-09 ENCOUNTER — Other Ambulatory Visit: Payer: Self-pay

## 2019-03-09 DIAGNOSIS — C211 Malignant neoplasm of anal canal: Secondary | ICD-10-CM | POA: Diagnosis not present

## 2019-03-09 DIAGNOSIS — Z853 Personal history of malignant neoplasm of breast: Secondary | ICD-10-CM | POA: Diagnosis not present

## 2019-03-09 DIAGNOSIS — Z08 Encounter for follow-up examination after completed treatment for malignant neoplasm: Secondary | ICD-10-CM | POA: Diagnosis not present

## 2019-03-09 NOTE — Progress Notes (Signed)
Radiation Oncology         [phone] ________________________________  Outpatient Follow Up - Conducted via telephone due to current COVID-19 concerns for limiting patient exposure  I spoke with the patient to conduct this consult visit via telephone to spare the patient unnecessary potential exposure in the healthcare setting during the current COVID-19 pandemic. The patient was notified in advance and was offered a [place] meeting to allow for face to face communication but unfortunately reported that they did not have the appropriate resources/technology to support such a visit and instead preferred to proceed with a telephone visit.   Given current concerns for patient exposure during the COVID-19 pandemic, this encounter was conducted via telephone.  The patient has given verbal consent for this type of encounter. 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    Interval Since Last Radiation: 7 months  03/11/2017 to 04/25/2017: The anus/pelvis was treated to 54 Gy in 30 fractions of 1.8 Gy.  Narrative:  Denise Wolfe is a pleasant 78 y.o. female with a history of squamous cell carcinoma of the proximal anus. She presented for screening colonoscopy on 02/11/2017 with Dr. Carlean Purl which revealed a  12 to 15 mm semi-sessile polyp in the rectum (vs proximal anus) and one 2 mm sessile polyp in the descending colon. Biopsy of the descending polyp showed tubular adenoma (one fragment) and no high grade dysplasia or malignancy. Biopsy of the rectum polyp confirmed squamous cell carcinoma, basaloid-type. There are multiple fragments of rectal mucosa which are involved by poorly differentiated squamous cell carcinoma with basaloid features.  CT C/A/P on 02/19/2017 showed abnormal right pelvic  sidewall lymph node measuring 1.9 x 1.6 cm, highly suspicious for metastatic disease. There is asymmetric wall thickening distal rectum, presumably the location of primary neoplasm. There is asymmetric airspace opacity in the left apex with a wedge shaped configuration on coronal imaging, this may be an area of scarring or potentially infectious/inflammatory etiology. There is a tiny right perifissural nodule in the right middle lobe, likely subpleural lymph node. Also noted were multiple low-density well-defined liver lesion, most suggestive of cysts. She completed chemoRT in September 2018. She has been followed by Dr. Burr Medico and Dr. Marcello Moores. She is overdue to see Dr. Burr Medico due to Covid, and is to see Dr. Marcello Moores next month.   On review of systems, the patient reports that she is doing well overall. She reports infrequent rectal bleeding that is attributed to internal hemorrhoids. She indicated that this has happened about once a month, and that it also happens following anoscopy. She  denies any chest pain, shortness of breath, cough, fevers, chills, night sweats, unintended weight changes. She denies any bowel or bladder disturbances, and denies abdominal pain, nausea or vomiting. She denies any new musculoskeletal or joint aches or pains, new skin lesions or concerns. She denies anal or vulvovaginal pain or itching. She denies lower extremity edema. A complete review of systems is obtained and is otherwise negative.   Past Medical History:  Past Medical History:  Diagnosis Date  . Anemia    during childhood  . Arthritis    hands, shoulder  . Breast cancer ([place]) 03/2000   left side modified mastecomy  . Cancer [facility])    breast, age 6; left side  . Colon cancer ([place])  02/11/2017   rectum-squamous cell carcinoma  . Depression   . Hypertension   . Personal history of chemotherapy   . Personal history of radiation therapy   . Squamous cell carcinoma of rectum ([place]) 02/14/2017    Past Surgical  History: Past Surgical History:  Procedure Laterality Date  . ABDOMINAL HYSTERECTOMY     With bilateral oophorectomy. 2002.  Marland Kitchen BREAST BIOPSY Right 2004   benign  . BREAST SURGERY Left 2001   biopsy  . CATARACT EXTRACTION Bilateral 2017  . IR FLUORO GUIDE CV LINE RIGHT  03/11/2017  . IR FLUORO GUIDE CV LINE RIGHT  04/16/2017  . IR US GUIDE VASC ACCESS RIGHT  03/11/2017  . IR US GUIDE VASC ACCESS RIGHT  04/16/2017  . MASTECTOMY MODIFIED RADICAL Left 2001  . MENISCUS DEBRIDEMENT Right 2003  . TUMOR EXCISION Right    thigh area    Social History:  Social History   Socioeconomic History  . Marital status: Widowed    Spouse name: Not on file  . Number of children: Not on file  . Years of education: Not on file  . Highest education level: Not on file  Occupational History  . Not on file  Social Needs  . Financial resource strain: Not on file  . Food insecurity    Worry: Not on file    Inability: Not on file  . Transportation needs    Medical: Not on file    Non-medical: Not on file  Tobacco Use  . Smoking status: Never Smoker  . Smokeless tobacco: Never Used  Substance and Sexual Activity  . Alcohol use: Yes    Alcohol/week: 14.0 standard drinks    Types: 14 Glasses of wine per week  . Drug use: No  . Sexual activity: Not Currently  Lifestyle  . Physical activity    Days per week: Not on file    Minutes per session: Not on file  . Stress: Not on file  Relationships  . Social Herbalist on phone: Not on file    Gets together: Not on file    Attends religious service: Not on file    Active member of club or organization: Not on file    Attends meetings of clubs or organizations: Not on file    Relationship status: Not on file  . Intimate partner violence    Fear of current or ex partner: Not on file    Emotionally abused: Not on file    Physically abused: Not on file    Forced sexual activity: Not on file  Other Topics Concern  . Not on file  Social  History Narrative  . Not on file  The patient is widowed. 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 She relocated to [place] to be closer to family a few years ago when her husband became ill and passed.     Family History: Family History  Problem Relation Age of Onset  . Cancer Mother 27       breast  . Breast cancer Mother   . Heart disease Father   . Colon cancer Neg Hx   . Esophageal cancer Neg Hx   . Rectal cancer Neg Hx   . Stomach cancer Neg Hx      ALLERGIES:  is allergic to ibuprofen.  Meds: Current Outpatient Medications  Medication Sig Dispense Refill  . amphetamine-dextroamphetamine (ADDERALL) 10 MG tablet Take 1 tablet (10 mg  total) by mouth every morning. 90 tablet 0  . atorvastatin (LIPITOR) 80 MG tablet Take 0.5 tablets (40 mg total) by mouth daily at 6 PM. 45 tablet 1  . cholecalciferol (VITAMIN D) 400 units TABS tablet Take 400 Units by mouth daily. Patient is not sure of the units.    . hydrochlorothiazide (MICROZIDE) 12.5 MG capsule TAKE 1 CAPSULE BY MOUTH  DAILY 90 capsule 0  . losartan (COZAAR) 50 MG tablet Take 0.5 tablets (25 mg total) by mouth daily. 45 tablet 0  . sertraline (ZOLOFT) 50 MG tablet Take 1.5 tablets (75 mg total) by mouth daily. 135 tablet 0   Current Facility-Administered Medications  Medication Dose Route Frequency Provider Last Rate Last Dose  . 0.9 %  sodium chloride infusion  500 mL Intravenous Continuous Denise Mayer, MD        Physical Findings: Unable to determine due to nature of visit.  Radiographic Findings: No results found.  Impression/Plan: 1. 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 We discussed continued surveillance with me annually as she continues to follow up with Dr. Burr Medico and Dr. Marcello Moores.  2. Cervical Cancer Screening. She has no history of abnormal Pap smears. She's had a hysterectomy and does not need Pap smear  screening however does need to have annual external and bimanual examination which will be done at her next visit or sooner if she has symptoms. 3. Breast Cancer screening and remote history of breast cancer. The patient continues with routine screening and is followed by Dr. Burr Medico.   The time spent during this encounter was 15 minutes and 50% of that time was spent in the coordination of her care. The attendants for this meeting include Shona Simpson, [facility] and Marco Collie  During the encounter, Shona Simpson [facility] was working remotely from home.  Christopher Hink  was located at home.     Carola Rhine, PAC

## 2019-04-01 ENCOUNTER — Other Ambulatory Visit: Payer: Self-pay | Admitting: Family Medicine

## 2019-04-01 DIAGNOSIS — F341 Dysthymic disorder: Secondary | ICD-10-CM

## 2019-04-01 DIAGNOSIS — F32A Depression, unspecified: Secondary | ICD-10-CM

## 2019-04-01 DIAGNOSIS — F430 Acute stress reaction: Secondary | ICD-10-CM

## 2019-04-01 DIAGNOSIS — F329 Major depressive disorder, single episode, unspecified: Secondary | ICD-10-CM

## 2019-04-01 DIAGNOSIS — E782 Mixed hyperlipidemia: Secondary | ICD-10-CM

## 2019-04-01 DIAGNOSIS — I10 Essential (primary) hypertension: Secondary | ICD-10-CM

## 2019-04-01 NOTE — Telephone Encounter (Signed)
Pt needs follow up in September with labs.  Charyl Bigger, CMA

## 2019-04-06 ENCOUNTER — Other Ambulatory Visit: Payer: Self-pay | Admitting: Family Medicine

## 2019-04-06 DIAGNOSIS — I10 Essential (primary) hypertension: Secondary | ICD-10-CM

## 2019-04-06 MED ORDER — LOSARTAN POTASSIUM 50 MG PO TABS: 25.0000 mg | ORAL_TABLET | Freq: Every day | ORAL | 0 refills | Status: DC

## 2019-04-06 NOTE — Telephone Encounter (Signed)
Patient called states she needs refill on :   atorvastatin (LIPITOR) 80 MG tablet [id number]   Order Details Dose: 40 mg Route: Oral Frequency: Daily-1800  Dispense Quantity: 45 tablet Refills: 1 Fills remaining: --        Sig: Take 0.5 tablets (40 mg total) by mouth daily at 6 PM.          ------SEE THIS MESSAGE IN Rx box:     Refusal reason: Patient has requested refill too soon   For: Mixed hyperlipidemia   --- forwarding pt request to send to :   [place], [place] [phone] (Phone) [phone] (Fax)   --glh

## 2019-04-06 NOTE — Telephone Encounter (Signed)
Patient called back to correct refill request was for Cozaar not Lipitor :  losartan (COZAAR) 50 MG tablet [id number]   Order Details Dose: 25 mg Route: Oral Frequency: Daily  Dispense Quantity: 45 tablet Refills: 0 Fills remaining: --        Sig: Take 0.5 tablets (25 mg total) by mouth daily.     --Forwarding request to medical assistant ( Correction ) & pt has made appt for 04/14/2019 Send refill order to :   [place], [place] [phone] (Phone) [phone] (Fax)   --glh

## 2019-04-06 NOTE — Telephone Encounter (Signed)
Already addressed.  Denise Wolfe, CMA

## 2019-04-14 ENCOUNTER — Encounter: Payer: Self-pay | Admitting: Family Medicine

## 2019-04-14 ENCOUNTER — Ambulatory Visit (INDEPENDENT_AMBULATORY_CARE_PROVIDER_SITE_OTHER): Payer: Medicare Other | Admitting: Family Medicine

## 2019-04-14 VITALS — BP 122/70 | Ht 62.0 in | Wt 131.0 lb

## 2019-04-14 DIAGNOSIS — C801 Malignant (primary) neoplasm, unspecified: Secondary | ICD-10-CM | POA: Diagnosis not present

## 2019-04-14 DIAGNOSIS — E782 Mixed hyperlipidemia: Secondary | ICD-10-CM

## 2019-04-14 DIAGNOSIS — I10 Essential (primary) hypertension: Secondary | ICD-10-CM

## 2019-04-14 DIAGNOSIS — F988 Other specified behavioral and emotional disorders with onset usually occurring in childhood and adolescence: Secondary | ICD-10-CM | POA: Diagnosis not present

## 2019-04-14 DIAGNOSIS — C20 Malignant neoplasm of rectum: Secondary | ICD-10-CM | POA: Diagnosis not present

## 2019-04-14 DIAGNOSIS — F430 Acute stress reaction: Secondary | ICD-10-CM

## 2019-04-14 DIAGNOSIS — E7889 Other lipoprotein metabolism disorders: Secondary | ICD-10-CM

## 2019-04-14 MED ORDER — AMPHETAMINE-DEXTROAMPHETAMINE 10 MG PO TABS: 10.0000 mg | ORAL_TABLET | ORAL | 0 refills | Status: DC

## 2019-04-14 NOTE — Progress Notes (Signed)
Virtual / live video office visit note for [employer], D.O- Primary Care Physician at [facility]   I connected with current patient today and beyond visually recognizing the correct individual, I verified that I am speaking with the correct person using two identifiers.  . Location of the patient: Home . Location of the provider: Office Only the patient (+/- their family members at pt's discretion) and myself were participating in the encounter    - This visit type was conducted due to national recommendations for restrictions regarding the COVID-19 Pandemic (e.g. social distancing) in an effort to limit this patient's exposure and mitigate transmission in our community.  This format is felt to be most appropriate for this patient at this time.   - The patient did have access to video technology today - No physical exam could be performed with this format, beyond that communicated to [place] by the patient/ family members as noted.   - Additionally my office staff/ schedulers discussed with the patient that there may be a monetary charge related to this service, depending on patient's medical insurance.   The patient expressed understanding, and agreed to proceed.      History of Present Illness:  Saying she's doing okay today.  "I really am fine."  Mood:  Feels her mood has been good.  Still taking 1.5 of Zoloft and states "well-controlled."  Up and down and lots of things going on in life, "but that's not so much chemical as life."  Says her son has "had one catastrophe after another," was diagnosed with CLL and then had a kidney stone and fell off a ladder and broke ribs.  Says "this is all in the last three months."  Covid:  Continues following guidelines.  Notes didn't even visit her son during his injuries because he lives in [place] and notes she was afraid of the case load.  ADD:  Taking her meds once daily; "sometimes I wish I had enough to take one more."  Says "every  now and then, I really want to be focused, and sometimes I've got that pinball machine brain; it won't stay on one thing when I need to."  Says it's not something she experiences on a daily or every other day basis, "it seems to be more situational than anything else."  Says "I don't want to take it more than once per day unless I need to do it to avoid getting frustrated."  Overall confirms working well, tolerating well. Sleeping well.   Chol:  Pt states she restarted her meds prior to last visit; notes continues taking them.  Thinks she has been "taking them for a good four months."  Says her only problem is with "swallowing them."  Drinking plenty of water and denies problems with S-E.  "I'm positive I've been back on them for four months."  HTN:  States BP has been good.  Running 122/70 a couple of days ago, and confirms this is about average.  Says "I'm not real religious about checking it, but when I do, it's been in that range."  Notes no longer getting dizzy since dropping the dose of losartan.  Cancer:  Cancer treatments are "over;" says "I had a video conference with the radiologist, radiation oncologist; seeing their PA."  Notes hasn't seen Dr. Burr Medico because "what's the point?"  Says she has no symptoms, "nothing happening," so [Dr. Feng] didn't even want to take labs.  Has not followed up with  GI.   Depression screen [facility] 2/9 01/28/2019 12/15/2018 09/30/2018 06/26/2018 02/06/2018  Decreased Interest 0 0 1 0 1  Down, Depressed, Hopeless 0 0 1 0 1  PHQ - 2 Score 0 0 2 0 2  Altered sleeping 1 0 1 1 1   Tired, decreased energy 0 0 1 1 1   Change in appetite 0 0 0 0 0  Feeling bad or failure about yourself  0 0 0 0 0  Trouble concentrating 3 3 2 1 2   Moving slowly or fidgety/restless 0 0 0 0 0  Suicidal thoughts 0 0 0 0 0  PHQ-9 Score 4 3 6 3 6   Difficult doing work/chores Somewhat difficult Not difficult at all Not difficult at all - Somewhat difficult  Some recent data might be hidden     GAD 7 : Generalized Anxiety Score 01/28/2019 12/15/2018 12/27/2016  Nervous, Anxious, on Edge 0 0 1  Control/stop worrying 0 0 2  Worry too much - different things 0 0 0  Trouble relaxing 1 1 1   Restless 0 3 0  Easily annoyed or irritable 0 0 1  Afraid - awful might happen 0 0 2  Total GAD 7 Score 1 4 7   Anxiety Difficulty Not difficult at all Somewhat difficult Not difficult at all     Impression and Recommendations:    1. Mixed hyperlipidemia   2. Elevated HDL:  >er 90   3. HTN (hypertension), benign   4. Squamous cell carcinoma of rectum (HCC)   5. History of breast Cancer   6. 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 Attention deficit disorder, unspecified hyperactivity presence     - Last visit January 28, 2019.   Mixed disorder as reaction to stress - Per pt, stable at this time. - Continue management as prescribed.  See med list. - Patient tolerating meds well without S-E.  - Patient knows to let us know if she needs to go up on her dose of Zoloft to handle current events.  - In addition to Rx intervention, reviewed the "spokes of the wheel" of mood and health management.  Stressed the importance of ongoing prudent habits, including regular exercise, appropriate sleep hygiene, healthful dietary habits, counseling with a therapist/life coach, and prayer/meditation to relax.  - Will continue to monitor.  Attention deficit disorder, unspecified hyperactivity presence  - Stable at this time.  Pt tolerating meds well without S-E. - Continue management as prescribed.  See med list. - Refill sent in.  - Ongoing prudent diet and exercise habits encouraged. - Patient knows to let us know if she experiences acute concerns.  - Will continue to monitor.  HTN (hypertension), benign  - Stable at this time. - Dizziness is resolved at this time since adjusting dose of losartan. - Per pt, "not completely resolved, but not so much that I pay attention to it anymore."  -  Continue management as prescribed.  See med list. - Patient tolerating treatment plan well.  - Prudent lifestyle habits encouraged such as ongoing physical activity and healthier diet.  - Ambulatory BP monitoring encouraged. Keep log and bring in next OV. - Will continue to monitor.  Mixed hyperlipidemia; elevated HDL >er 90 - Stable at this time. - Per pt, resumed management on medication sometime before June of 2020. - Per pt, "I think I've been on them a good four months." - Pt tolerating meds well and takes them as prescribed. - Continues on half-tablet of Lipitor.  See  med list. - Continue management as prescribed.  - Will continue to monitor and re-check as recommended near future. - Pt agrees to come in for fasting blood work lab-only visit.  History of breast Cancer, Squamous cell carcinoma of rectum ([place])  - Continue follow-up with specialists as scheduled and advised.  - Will continue to monitor.  Recommendations - Last labs checked 07/31/2018.  Need for re-check. - Discussed need to minimize exposure risk to patient during COVID. - Return in 3 months for ADD follow-up.  - Health counseling performed.  All questions answered.  - As part of my medical decision making, I reviewed the following data within the [place] History obtained from pt /family, CMA notes reviewed and incorporated if applicable, Labs reviewed, Radiograph/ tests reviewed if applicable and OV notes from prior OV's with me, as well as other specialists she/he has seen since seeing me last, were all reviewed and used in my medical decision making process today.   - Additionally, discussion had with patient regarding txmnt plan, their biases about that plan etc were used in my medical decision making today.   - The patient agreed with the plan and demonstrated an understanding of the instructions.   No barriers to understanding were identified.   - Red flag symptoms and signs discussed in  detail.  Patient expressed understanding regarding what to do in case of emergency\ urgent symptoms.  The patient was advised to call back or seek an in-person evaluation if the symptoms worsen or if the condition fails to improve as anticipated.   Return for near future- come in for FBW- lab only. then q 3 mo- add.    No orders of the defined types were placed in this encounter.   Meds ordered this encounter  Medications  . amphetamine-dextroamphetamine (ADDERALL) 10 MG tablet    Sig: Take 1 tablet (10 mg total) by mouth every morning.    Dispense:  90 tablet    Refill:  0    Medications Discontinued During This Encounter  Medication Reason  . amphetamine-dextroamphetamine (ADDERALL) 10 MG tablet Reorder      I provided approximately 20 minutes of face-to-face time during this encounter.  Additional time was spent with charting and coordination of care after the actual visit commenced.   Note:  This note was prepared with assistance of Dragon voice recognition software. Occasional wrong-word or sound-a-like substitutions may have occurred due to the inherent limitations of voice recognition software.  This document serves as a record of services personally performed by [name], DO. It was created on her behalf by [name], a trained medical scribe. The creation of this record is based on the scribe's personal observations and the provider's statements to them.   I have reviewed the above medical documentation for accuracy and completeness and I concur.  [name], DO 04/14/2019 2:37 PM        Patient Care Team    Relationship Specialty Notifications Start End  [name], DO PCP - General Family Medicine  05/10/16   Silverio Decamp, MD Consulting Physician Family Medicine  07/25/16    Comment: OA - shoulder injeciton  Truitt Merle, MD Consulting Physician Hematology  [id number]   Leighton Ruff, MD Consulting Physician General Surgery   07/23/17     -Vitals obtained; medications/ allergies reconciled;  personal medical, social, Sx etc.histories were updated by CMA, reviewed by me and are reflected in chart  Patient Active Problem List   Diagnosis Date  Noted  . Squamous cell carcinoma of rectum ([place]) 02/14/2017    Priority: High  . HLD (hyperlipidemia) 07/19/2016    Priority: High  . Elevated HDL:  >er 90 07/19/2016    Priority: High  . History of breast Cancer 05/10/2016    Priority: High  . HTN (hypertension), benign 05/10/2016    Priority: High  . Breast cancer ([place]) 03/13/2000    Priority: High  . 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 Depression, recurrent ([place]) 05/10/2016    Priority: Medium  . Chemotherapy induced neutropenia (HCC) 04/14/2017    Priority: Low  . Vitamin D insufficiency 07/19/2016    Priority: Low  . Degenerative joint disease (DJD) of sternoclavicular joint, right 07/19/2016    Priority: Low  . Pain in joint, shoulder region 05/10/2016    Priority: Low  . Personal history of radiation therapy 09/30/2018  . Personal history of chemotherapy 09/30/2018  . Anemia due to antineoplastic chemotherapy 05/14/2017  . Pneumonia of left lower lobe due to infectious organism ([place]) 05/09/2017  . Difficulty sleeping 05/09/2017  . Burning sensation- scalp esp at night 05/09/2017  . Wheeze 05/09/2017  . Squamous cell carcinoma of anal canal ([place]) 04/11/2017  . Colon cancer ([place]) 02/11/2017  . Attention deficit disorder 12/27/2016  . Multiple atypical nevi 12/27/2016  . Nonspecific chest pain 08/08/2016  . Retrosternal chest pain 07/19/2016  . Cataract 05/10/2016     Current Meds  Medication Sig  . amphetamine-dextroamphetamine (ADDERALL) 10 MG tablet Take 1 tablet (10 mg total) by mouth every morning.  Marland Kitchen atorvastatin (LIPITOR) 80 MG tablet Take 0.5 tablets (40 mg total) by mouth daily at 6 PM.  . cholecalciferol (VITAMIN D) 400 units TABS tablet Take 400 Units  by mouth daily. Patient is not sure of the units.  . hydrochlorothiazide (MICROZIDE) 12.5 MG capsule TAKE 1 CAPSULE BY MOUTH  DAILY  . losartan (COZAAR) 50 MG tablet Take 0.5 tablets (25 mg total) by mouth daily.  . sertraline (ZOLOFT) 50 MG tablet TAKE 1 AND 1/2 TABLETS BY  MOUTH DAILY  . [DISCONTINUED] amphetamine-dextroamphetamine (ADDERALL) 10 MG tablet Take 1 tablet (10 mg total) by mouth every morning.   Current Facility-Administered Medications for the 04/14/19 encounter (Office Visit) with [name], DO  Medication  . 0.9 %  sodium chloride infusion     Allergies  Allergen Reactions  . Ibuprofen Rash     ROS:  See above HPI for pertinent positives and negatives   Objective:   Blood pressure 122/70, height 5\' 2"  (1.575 m), weight 131 lb (59.4 kg).  (if some vitals are omitted, this means that patient was UNABLE to obtain them even though they were asked to get them prior to OV today.  They were asked to call us at their earliest convenience with these once obtained.)  General: A & O * 3; visually in no acute distress; in usual state of health.  Skin: Visible skin appears normal and pt's usual skin color HEENT:  EOMI, head is normocephalic and atraumatic.  Sclera are anicteric. Neck has a good range of motion.  Lips are noncyanotic Chest: normal chest excursion and movement Respiratory: speaking in full sentences, no conversational dyspnea; no use of accessory muscles Psych: insight good, mood- appears full

## 2019-05-04 ENCOUNTER — Other Ambulatory Visit: Payer: Self-pay | Admitting: Family Medicine

## 2019-05-04 DIAGNOSIS — E782 Mixed hyperlipidemia: Secondary | ICD-10-CM

## 2019-05-25 ENCOUNTER — Other Ambulatory Visit: Payer: Self-pay | Admitting: Family Medicine

## 2019-05-25 DIAGNOSIS — I10 Essential (primary) hypertension: Secondary | ICD-10-CM

## 2019-05-29 ENCOUNTER — Other Ambulatory Visit: Payer: Self-pay | Admitting: Family Medicine

## 2019-05-29 DIAGNOSIS — I10 Essential (primary) hypertension: Secondary | ICD-10-CM

## 2019-06-22 ENCOUNTER — Other Ambulatory Visit: Payer: Self-pay | Admitting: Family Medicine

## 2019-06-22 DIAGNOSIS — E782 Mixed hyperlipidemia: Secondary | ICD-10-CM

## 2019-06-24 ENCOUNTER — Other Ambulatory Visit: Payer: Self-pay | Admitting: Family Medicine

## 2019-06-24 DIAGNOSIS — F341 Dysthymic disorder: Secondary | ICD-10-CM

## 2019-06-24 DIAGNOSIS — F329 Major depressive disorder, single episode, unspecified: Secondary | ICD-10-CM

## 2019-06-24 DIAGNOSIS — F430 Acute stress reaction: Secondary | ICD-10-CM

## 2019-06-24 DIAGNOSIS — I10 Essential (primary) hypertension: Secondary | ICD-10-CM

## 2019-06-24 DIAGNOSIS — F32A Depression, unspecified: Secondary | ICD-10-CM

## 2019-07-17 ENCOUNTER — Other Ambulatory Visit: Payer: Self-pay | Admitting: Family Medicine

## 2019-07-17 DIAGNOSIS — I10 Essential (primary) hypertension: Secondary | ICD-10-CM

## 2019-08-11 ENCOUNTER — Other Ambulatory Visit: Payer: Self-pay

## 2019-08-11 ENCOUNTER — Other Ambulatory Visit: Payer: Medicare Other

## 2019-08-11 DIAGNOSIS — Z9221 Personal history of antineoplastic chemotherapy: Secondary | ICD-10-CM

## 2019-08-11 DIAGNOSIS — I10 Essential (primary) hypertension: Secondary | ICD-10-CM

## 2019-08-11 DIAGNOSIS — T451X5A Adverse effect of antineoplastic and immunosuppressive drugs, initial encounter: Secondary | ICD-10-CM

## 2019-08-11 DIAGNOSIS — Z923 Personal history of irradiation: Secondary | ICD-10-CM

## 2019-08-11 DIAGNOSIS — C50919 Malignant neoplasm of unspecified site of unspecified female breast: Secondary | ICD-10-CM

## 2019-08-11 DIAGNOSIS — F339 Major depressive disorder, recurrent, unspecified: Secondary | ICD-10-CM

## 2019-08-11 DIAGNOSIS — E782 Mixed hyperlipidemia: Secondary | ICD-10-CM

## 2019-08-11 DIAGNOSIS — D701 Agranulocytosis secondary to cancer chemotherapy: Secondary | ICD-10-CM

## 2019-08-11 DIAGNOSIS — C20 Malignant neoplasm of rectum: Secondary | ICD-10-CM

## 2019-08-12 ENCOUNTER — Other Ambulatory Visit: Payer: Self-pay | Admitting: Family Medicine

## 2019-08-12 DIAGNOSIS — I10 Essential (primary) hypertension: Secondary | ICD-10-CM

## 2019-08-12 LAB — CBC WITH DIFFERENTIAL/PLATELET
Basophils Absolute: 0.1 10*3/uL (ref 0.0–0.2)
Basos: 2 %
EOS (ABSOLUTE): 0.2 10*3/uL (ref 0.0–0.4)
Eos: 5 %
Hematocrit: 39.1 % (ref 34.0–46.6)
Hemoglobin: 13.9 g/dL (ref 11.1–15.9)
Immature Grans (Abs): 0 10*3/uL (ref 0.0–0.1)
Immature Granulocytes: 0 %
Lymphocytes Absolute: 0.7 10*3/uL (ref 0.7–3.1)
Lymphs: 21 %
MCH: 32 pg (ref 26.6–33.0)
MCHC: 35.5 g/dL (ref 31.5–35.7)
MCV: 90 fL (ref 79–97)
Monocytes Absolute: 0.5 10*3/uL (ref 0.1–0.9)
Monocytes: 14 %
Neutrophils Absolute: 1.9 10*3/uL (ref 1.4–7.0)
Neutrophils: 58 %
Platelets: 310 10*3/uL (ref 150–450)
RBC: 4.34 x10E6/uL (ref 3.77–5.28)
RDW: 11.9 % (ref 11.7–15.4)
WBC: 3.2 10*3/uL — ABNORMAL LOW (ref 3.4–10.8)

## 2019-08-12 LAB — LIPID PANEL
Chol/HDL Ratio: 2.3 ratio (ref 0.0–4.4)
Cholesterol, Total: 171 mg/dL (ref 100–199)
HDL: 76 mg/dL (ref >39)
LDL Chol Calc (NIH): 83 mg/dL (ref 0–99)
Triglycerides: 61 mg/dL (ref 0–149)
VLDL Cholesterol Cal: 12 mg/dL (ref 5–40)

## 2019-08-12 LAB — MAGNESIUM: Magnesium: 2.1 mg/dL (ref 1.6–2.3)

## 2019-08-12 LAB — TSH: TSH: 2.47 u[IU]/mL (ref 0.450–4.500)

## 2019-08-12 LAB — T3, FREE: T3, Free: 2.8 pg/mL (ref 2.0–4.4)

## 2019-08-12 LAB — T4, FREE: Free T4: 0.96 ng/dL (ref 0.82–1.77)

## 2019-08-12 LAB — VITAMIN B12: Vitamin B-12: 233 pg/mL (ref 232–1245)

## 2019-08-12 LAB — PHOSPHORUS: Phosphorus: 3.9 mg/dL (ref 3.0–4.3)

## 2019-08-13 ENCOUNTER — Encounter: Payer: Self-pay | Admitting: Family Medicine

## 2019-08-13 ENCOUNTER — Other Ambulatory Visit: Payer: Self-pay

## 2019-08-13 ENCOUNTER — Ambulatory Visit (INDEPENDENT_AMBULATORY_CARE_PROVIDER_SITE_OTHER): Payer: Medicare Other | Admitting: Family Medicine

## 2019-08-13 VITALS — BP 141/80 | HR 71 | Temp 97.4°F | Wt 133.0 lb

## 2019-08-13 DIAGNOSIS — R42 Dizziness and giddiness: Secondary | ICD-10-CM | POA: Insufficient documentation

## 2019-08-13 DIAGNOSIS — F339 Major depressive disorder, recurrent, unspecified: Secondary | ICD-10-CM

## 2019-08-13 DIAGNOSIS — C801 Malignant (primary) neoplasm, unspecified: Secondary | ICD-10-CM

## 2019-08-13 DIAGNOSIS — F430 Acute stress reaction: Secondary | ICD-10-CM | POA: Diagnosis not present

## 2019-08-13 DIAGNOSIS — M65341 Trigger finger, right ring finger: Secondary | ICD-10-CM

## 2019-08-13 DIAGNOSIS — E782 Mixed hyperlipidemia: Secondary | ICD-10-CM | POA: Diagnosis not present

## 2019-08-13 DIAGNOSIS — E7889 Other lipoprotein metabolism disorders: Secondary | ICD-10-CM

## 2019-08-13 DIAGNOSIS — I10 Essential (primary) hypertension: Secondary | ICD-10-CM | POA: Diagnosis not present

## 2019-08-13 DIAGNOSIS — E538 Deficiency of other specified B group vitamins: Secondary | ICD-10-CM

## 2019-08-13 DIAGNOSIS — C20 Malignant neoplasm of rectum: Secondary | ICD-10-CM

## 2019-08-13 DIAGNOSIS — F988 Other specified behavioral and emotional disorders with onset usually occurring in childhood and adolescence: Secondary | ICD-10-CM

## 2019-08-13 MED ORDER — LOSARTAN POTASSIUM 50 MG PO TABS: 50.0000 mg | ORAL_TABLET | Freq: Every day | ORAL | 0 refills | Status: DC

## 2019-08-13 MED ORDER — CYANOCOBALAMIN 500 MCG PO TABS: 500.0000 ug | ORAL_TABLET | Freq: Every day | ORAL | Status: AC

## 2019-08-13 MED ORDER — AMPHETAMINE-DEXTROAMPHETAMINE 10 MG PO TABS: 10.0000 mg | ORAL_TABLET | ORAL | 0 refills | Status: DC

## 2019-08-13 NOTE — Progress Notes (Signed)
Telehealth office visit note for Denise Wolfe, D.O- at Primary Care at [facility]   I connected with current patient today and verified that I am speaking with the correct person using two identifiers.   . Location of the patient: Home . Location of the provider: Office Only the patient (+/- their family members at pt's discretion) and myself were participating in the encounter - This visit type was conducted due to national recommendations for restrictions regarding the COVID-19 Pandemic (e.g. social distancing) in an effort to limit this patient's exposure and mitigate transmission in our community.  This format is felt to be most appropriate for this patient at this time.   - The patient did not have access to video technology or had technical difficulties with video requiring transitioning to audio format only. - No physical exam could be performed with this format, beyond that communicated to [place] by the patient/ family members as noted.   - Additionally my office staff/ schedulers discussed with the patient that there may be a monetary charge related to this service, depending on their medical insurance.   The patient expressed understanding, and agreed to proceed.       History of Present Illness: Hypertension, Hyperlipidemia, and ADHD   I, [name], am serving as scribe for Dr. Mellody Wolfe.  - Mood Management Notes doing okay.  Says she's been staying pretty stable; continues 1.5 Zoloft.  - Breast Cancer Treatment Notes "I've had no communication from them; I did talk to the PA from the radiation oncologist back in August."  Says "I haven't seen the surgeon and I haven't seen the oncologist in a year."  - Attention Deficit Disorder Patient notes she needs a refill and has been taking Adderall historically.  Notes she notices focus issues big time when she doesn't take it, "and it upsets me."  - Recent Trigger Finger Thinks she has developed trigger  finger.  Says one of her fingers gets stuck, hurts real bad, and is not getting better and she thinks is getting worse.  Reports symptoms in right hand ring finger.  HPI:  Hypertension:  -  Her blood pressure at home has been running: notes "it's up a little bit," around 141/80 last time she checked it.  Notes "it was lower than that for quite a while, and then it started inching up.  I don't check it all that often."  Was running closer to goal in spring and summer.  - Patient reports good compliance with medication and/or lifestyle modification  In the end of the spring 2020, after experiencing dizziness along with BP in low-normal range, her losartan was cut from 50 mg to 25 mg daily.  Her dizziness resolved by the end of September.  Notes she had one really bad episode of dizziness about 3-4 weeks ago.  Says she woke up early in the morning, sat up to stand up, and when she stood up, "I felt like I was on a ship in the middle of a hurricane, and could not stand."  Notes this dizziness is not what she was experiencing prior.  Says when this happened, she sat back down and went back to sleep.  Notes it "got better" when she sat back down.  Says all morning long, into the late afternoon, "I felt like I needed to walk like a sailor."  In the past she was experiencing dizziness while bending and leaning over to pick things up, then going  upright again.  She doesn't think her dizziness is related to her cancer treatment.  - Her denies acute concerns or problems related to treatment plan  - She denies new onset of: chest pain, exercise intolerance, shortness of breath, dizziness, visual changes, headache, lower extremity swelling or claudication.   Last 3 blood pressure readings in our office are as follows: BP Readings from Last 3 Encounters:  08/13/19 (!) 141/80  04/14/19 122/70  01/28/19 114/72   Filed Weights   08/13/19 1309  Weight: 133 lb (60.3 kg)    HPI:  Hyperlipidemia:  78  y.o. female here for cholesterol follow-up.   - Patient reports good compliance with treatment plan of:  medication and/ or lifestyle management; has continued taking her medications consistently.  - Patient denies any acute concerns or problems with management plan   - She denies new onset of: myalgias, arthralgias, increased fatigue more than normal, chest pains, exercise intolerance, shortness of breath, dizziness, visual changes, headache, lower extremity swelling or claudication.   Most recent cholesterol panel was:  Lab Results  Component Value Date   CHOL 171 08/11/2019   HDL 76 08/11/2019   LDLCALC 83 08/11/2019   TRIG 61 08/11/2019   CHOLHDL 2.3 08/11/2019   Hepatic Function Latest Ref Rng & Units 08/11/2019 07/31/2018 01/28/2018  Total Protein 6.0 - 8.5 g/dL 6.3 7.2 6.8  Albumin 3.7 - 4.7 g/dL 4.3 4.1 4.1  AST 0 - 40 IU/L '19 18 18  '$ ALT 0 - 32 IU/L '11 13 9  '$ Alk Phosphatase 39 - 117 IU/L 46 50 43  Total Bilirubin 0.0 - 1.2 mg/dL <0.2 0.3 0.3      GAD 7 : Generalized Anxiety Score 01/28/2019 12/15/2018 12/27/2016  Nervous, Anxious, on Edge 0 0 1  Control/stop worrying 0 0 2  Worry too much - different things 0 0 0  Trouble relaxing '1 1 1  '$ Restless 0 3 0  Easily annoyed or irritable 0 0 1  Afraid - awful might happen 0 0 2  Total GAD 7 Score '1 4 7  '$ Anxiety Difficulty Not difficult at all Somewhat difficult Not difficult at all    Depression screen [facility] 2/9 08/13/2019 01/28/2019 12/15/2018 09/30/2018 06/26/2018  Decreased Interest 0 0 0 1 0  Down, Depressed, Hopeless 0 0 0 1 0  PHQ - 2 Score 0 0 0 2 0  Altered sleeping 1 1 0 1 1  Tired, decreased energy 1 0 0 1 1  Change in appetite 0 0 0 0 0  Feeling bad or failure about yourself  0 0 0 0 0  Trouble concentrating '1 3 3 2 1  '$ Moving slowly or fidgety/restless 0 0 0 0 0  Suicidal thoughts 0 0 0 0 0  PHQ-9 Score '3 4 3 6 3  '$ Difficult doing work/chores Not difficult at all Somewhat difficult Not difficult at all Not  difficult at all -  Some recent data might be hidden      Impression and Recommendations:    1. Mixed hyperlipidemia   2. Elevated HDL:  >er 90   3. HTN (hypertension), benign   4. Mixed disorder as reaction to stress   5. Attention deficit disorder, unspecified hyperactivity presence   6. Depression, recurrent ([place])   7. Dizziness of unknown cause- worsening    8. Squamous cell carcinoma of rectum (HCC)   9. History of breast Cancer   10. B12 deficiency   11. Trigger ring finger of right hand  Dizziness of Unknown Cause, Worsening - h/o Breast CA, Squamous Cell CA of Rectum - Symptoms not stable at this time. - Discussed patient's symptoms at length during appointment today. - Conversation held regarding patient's positional dizziness, with recent acute episode. - Advised MRI of head / imaging to assess.  Patient's last PET scan obtained in 2018.  - MRI ordered today, to obtain ASAP.  See orders.  - If at any time, symptoms get worse, encouraged patient to seek immediate emergency care.    - Red flag symptoms and signs discussed in detail.  Patient expressed understanding regarding what to do in case of emergency\ urgent symptoms.   - The patient was advised to call back or seek an in-person evaluation if the symptoms worsen or if the condition fails to improve as anticipated.  - Patient knows to call and follow up with oncological specialists as advised.  Attention Deficit Disorder - Has historically taken one tablet of Adderall per day. - Per patient, symptoms are stable while managed on Adderall.  - Discussed risks, benefits, and alternatives to Adderall management.  Reviewed with patient that while managed on Adderall, her dizziness may worsen, she may experience dehydration, and elevated blood pressure may occur due to use of medication, in addition to other possible side-effects. - Patient understands risks and that her symptoms could be exacerbated by use of  Adderall.  Per patient, she has taken Adderall for so long, she has difficulty believing her symptoms are due to it.  Per patient, "I really notice the difference if I don't take it."  - Will continue to monitor.  Trigger Finger - Right Ring Finger - Reviewed symptoms with patient. - Ambulatory referral to sports medicine placed today.  See orders. - Will continue to monitor.  Recurrent Depression, Mixed Disorder as Reaction to Stress - Stable on Zoloft management. - Continue management as established.  - In addition to prescription intervention, reviewed the "spokes of the wheel" of mood and health management.  Stressed the importance of ongoing prudent habits, including regular exercise, appropriate sleep hygiene, healthful dietary habits, and prayer/meditation to relax.  - Will continue to monitor.  - Reviewed recent lab work (08/11/2019) in depth with patient today.  All lab work within normal limits unless otherwise noted.  Extensive education provided and all questions answered.  B12 Deficiency - B12 last measured at 233, low normal range.  Not optimized. - Begin B12 OTC, 500 mcg daily. - Will continue to monitor and re-check as recommended in 3-4 months.  CBC - improved from prior - CBC values stable. - WBC improved to 3.2 from 2.9 prior. - RDW improved to 11.9 from 11.4 prior.  - Will continue to monitor and re-check as discussed.  Mixed Hyperlipidemia, Elevated HDL - Last FLP obtained 2 days ago:  Triglycerides = 61, down from 79 prior. HDL = 76, up from 67 prior. LDL = 83, down from 124 prior.  - Cholesterol levels are stable, at goal. - Pt will continue current treatment regimen.  See med list.  - Prudent dietary changes such as low saturated & trans fat diets for hyperlipidemia and low carb/ ketogenic diets for hypertriglyceridemia discussed with patient.    - Encouraged patient to follow AHA guidelines for regular exercise and also engage in weight loss if BMI  above 25.   - Educational handouts provided at patient's desire and/ or told to look online at the [employer] website for further information.  - We will continue to  monitor and re-check as discussed.  HTN, Benign - Blood pressure currently is stable, slightly above goal. - Increase dose of losartan to 50 mg, whole tablet.  See med list.  - Counseled patient on pathophysiology of disease and discussed various treatment options, which always includes dietary and lifestyle modification as first line.   - Lifestyle changes such as dash and heart healthy diets and engaging in a regular exercise program discussed extensively with patient.   - Told patient to check her blood pressure and pulse while lying down in bed.  While leaving the cuff on the arm, begin to stand up, and if the patient feels dizzy, hit the button again and write down blood pressure and pulse.  Bring log in for re-check in 4 weeks.  - Ambulatory blood pressure monitoring encouraged at least 3 times weekly.  Keep log and bring in every office visit.  Reminded patient that if they ever feel poorly in any way, to check their blood pressure and pulse.  - Handouts provided at patient's desire and/or told to go online at the [employer] website for further information  - We will continue to monitor.  Health Counseling & Preventative Maintenance - Advised patient to continue working toward exercising to improve overall mental, physical, and emotional health.    - Encouraged patient to engage in daily physical activity, especially a formal exercise routine.  Recommended that the patient eventually strive for at least 150 minutes of moderate cardiovascular activity per week according to guidelines established by the [facility].   - Healthy dietary habits encouraged, including low-carb, and high amounts of lean protein in diet.   - Patient should also consume adequate amounts of water.  - Health counseling  performed.  All questions answered. - Patient knows to call with any further questions.  Recommendations - Return with BP log in 4 weeks after monitoring closely for orthostatic hypotension. - Return OV in 3-4 months   - As part of my medical decision making, I reviewed the following data within the [place] History obtained from pt /family, CMA notes reviewed and incorporated if applicable, Labs reviewed, Radiograph/ tests reviewed if applicable and OV notes from prior OV's with me, as well as other specialists she/he has seen since seeing me last, were all reviewed and used in my medical decision making process today.    - Additionally, discussion had with patient regarding our treatment plan, and their biases/concerns about that plan were used in my medical decision making today.    - The patient agreed with the plan and demonstrated an understanding of the instructions.   No barriers to understanding were identified.      Return in 4 weeks (on 09/10/2019) for dizzy sx-MRI, bring BP log ; then again in 3-4 months chronic f/up- (reck b12).    Orders Placed This Encounter  Procedures  . MR Brain W Wo Contrast  . Ambulatory referral to Sports Medicine    Meds ordered this encounter  Medications  . losartan (COZAAR) 50 MG tablet    Sig: Take 1 tablet (50 mg total) by mouth daily.    Dispense:  90 tablet    Refill:  0    Requesting 1 year supply  . vitamin B-12 (CYANOCOBALAMIN) 500 MCG tablet    Sig: Take 1 tablet (500 mcg total) by mouth daily.  Marland Kitchen amphetamine-dextroamphetamine (ADDERALL) 10 MG tablet    Sig: Take 1 tablet (10 mg total) by mouth every morning.  Dispense:  90 tablet    Refill:  0    Medications Discontinued During This Encounter  Medication Reason  . losartan (COZAAR) 50 MG tablet Reorder  . amphetamine-dextroamphetamine (ADDERALL) 10 MG tablet Reorder      I provided 32+ minutes of non face-to-face time during this encounter.   Additional time was spent with charting and coordination of care before and after the actual visit commenced.   Note:  This note was prepared with assistance of Dragon voice recognition software. Occasional wrong-word or sound-a-like substitutions may have occurred due to the inherent limitations of voice recognition software.   This document serves as a record of services personally performed by Denise Dance, DO. It was created on her behalf by [name], a trained medical scribe. The creation of this record is based on the scribe's personal observations and the provider's statements to them.   This case required medical decision making of at least moderate complexity. The above documentation has been reviewed to be accurate and was completed by Marjory Sneddon, D.O.      Patient Care Team    Relationship Specialty Notifications Start End  Denise Dance, DO PCP - General Family Medicine  05/10/16   Silverio Decamp, MD Consulting Physician Family Medicine  07/25/16    Comment: OA - shoulder injeciton  Truitt Merle, MD Consulting Physician Hematology  93/81/82   Leighton Ruff, MD Consulting Physician General Surgery  07/23/17      -Vitals obtained; medications/ allergies reconciled;  personal medical, social, Sx etc.histories were updated by CMA, reviewed by me and are reflected in chart   Patient Active Problem List   Diagnosis Date Noted  . Squamous cell carcinoma of rectum ([place]) 02/14/2017  . HLD (hyperlipidemia) 07/19/2016  . Elevated HDL:  >er 90 07/19/2016  . History of breast Cancer 05/10/2016  . HTN (hypertension), benign 05/10/2016  . Breast cancer ([place]) 03/13/2000  . 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 Depression, recurrent ([place]) 05/10/2016  . Chemotherapy induced neutropenia ([place]) 04/14/2017  . Vitamin D insufficiency 07/19/2016  . Degenerative joint disease (DJD) of sternoclavicular joint, right 07/19/2016  . Pain in joint,  shoulder region 05/10/2016  . Dizziness on standing 08/13/2019  . B12 deficiency 08/13/2019  . Personal history of radiation therapy 09/30/2018  . Personal history of chemotherapy 09/30/2018  . Anemia due to antineoplastic chemotherapy 05/14/2017  . Pneumonia of left lower lobe due to infectious organism 05/09/2017  . Difficulty sleeping 05/09/2017  . Burning sensation- scalp esp at night 05/09/2017  . Wheeze 05/09/2017  . Squamous cell carcinoma of anal canal ([place]) 04/11/2017  . Colon cancer ([place]) 02/11/2017  . Attention deficit disorder 12/27/2016  . Multiple atypical nevi 12/27/2016  . Nonspecific chest pain 08/08/2016  . Retrosternal chest pain 07/19/2016  . Cataract 05/10/2016     Current Meds  Medication Sig  . amphetamine-dextroamphetamine (ADDERALL) 10 MG tablet Take 1 tablet (10 mg total) by mouth every morning.  Marland Kitchen atorvastatin (LIPITOR) 80 MG tablet TAKE ONE-HALF TABLET BY  MOUTH DAILY AT 6 PM.  . cholecalciferol (VITAMIN D) 400 units TABS tablet Take 400 Units by mouth daily. Patient is not sure of the units.  . hydrochlorothiazide (MICROZIDE) 12.5 MG capsule TAKE 1 CAPSULE BY MOUTH  DAILY  . losartan (COZAAR) 50 MG tablet Take 1 tablet (50 mg total) by mouth daily.  . sertraline (ZOLOFT) 50 MG tablet TAKE 1 AND 1/2 TABLETS BY  MOUTH DAILY  . [DISCONTINUED] amphetamine-dextroamphetamine (ADDERALL)  10 MG tablet Take 1 tablet (10 mg total) by mouth every morning.  . [DISCONTINUED] losartan (COZAAR) 50 MG tablet TAKE ONE-HALF TABLET BY  MOUTH DAILY   Current Facility-Administered Medications for the 08/13/19 encounter (Office Visit) with Denise Dance, DO  Medication  . 0.9 %  sodium chloride infusion     Allergies:  Allergies  Allergen Reactions  . Ibuprofen Rash     ROS:  See above HPI for pertinent positives and negatives   Objective:   Blood pressure (!) 141/80, pulse 71, temperature (!) 97.4 F (36.3 C), weight 133 lb (60.3 kg).  (if some vitals are  omitted, this means that patient was UNABLE to obtain them even though they were asked to get them prior to OV today.  They were asked to call us at their earliest convenience with these once obtained. )  General: A & O * 3; sounds in no acute distress; in usual state of health.  Skin: Pt confirms warm and dry extremities and pink fingertips HEENT: Pt confirms lips non-cyanotic Chest: Patient confirms normal chest excursion and movement Respiratory: speaking in full sentences, no conversational dyspnea; patient confirms no use of accessory muscles Psych: insight appears good, mood- appears full

## 2019-08-14 LAB — COMPREHENSIVE METABOLIC PANEL
ALT: 11 IU/L (ref 0–32)
AST: 19 IU/L (ref 0–40)
Albumin/Globulin Ratio: 2.2 (ref 1.2–2.2)
Albumin: 4.3 g/dL (ref 3.7–4.7)
Alkaline Phosphatase: 46 IU/L (ref 39–117)
BUN/Creatinine Ratio: 35 — ABNORMAL HIGH (ref 12–28)
BUN: 27 mg/dL (ref 8–27)
Bilirubin Total: <0.2 mg/dL (ref 0.0–1.2)
CO2: 23 mmol/L (ref 20–29)
Calcium: 9.9 mg/dL (ref 8.7–10.3)
Chloride: 102 mmol/L (ref 96–106)
Creatinine, Ser: 0.78 mg/dL (ref 0.57–1.00)
GFR calc Af Amer: 85 mL/min/{1.73_m2} (ref >59)
GFR calc non Af Amer: 74 mL/min/{1.73_m2} (ref >59)
Globulin, Total: 2 g/dL (ref 1.5–4.5)
Glucose: 100 mg/dL — ABNORMAL HIGH (ref 65–99)
Potassium: 4.2 mmol/L (ref 3.5–5.2)
Sodium: 141 mmol/L (ref 134–144)
Total Protein: 6.3 g/dL (ref 6.0–8.5)

## 2019-08-14 LAB — VITAMIN D 25 HYDROXY (VIT D DEFICIENCY, FRACTURES): Vit D, 25-Hydroxy: 31.6 ng/mL (ref 30.0–100.0)

## 2019-08-14 LAB — SPECIMEN STATUS REPORT

## 2019-08-15 ENCOUNTER — Other Ambulatory Visit: Payer: Self-pay

## 2019-08-15 ENCOUNTER — Ambulatory Visit
Admission: RE | Admit: 2019-08-15 | Discharge: 2019-08-15 | Disposition: A | Discharge status: Home / Self Care | Payer: Medicare Other | Source: Ambulatory Visit | Attending: Family Medicine | Admitting: Family Medicine

## 2019-08-15 DIAGNOSIS — C801 Malignant (primary) neoplasm, unspecified: Secondary | ICD-10-CM

## 2019-08-15 DIAGNOSIS — C20 Malignant neoplasm of rectum: Secondary | ICD-10-CM

## 2019-08-15 DIAGNOSIS — R42 Dizziness and giddiness: Secondary | ICD-10-CM

## 2019-08-15 MED ORDER — GADOBENATE DIMEGLUMINE 529 MG/ML IV SOLN
12.0000 mL | Freq: Once | INTRAVENOUS | Status: AC | PRN
  Administered 2019-08-15: 12 mL via INTRAVENOUS

## 2019-08-20 ENCOUNTER — Other Ambulatory Visit: Payer: Self-pay

## 2019-08-20 ENCOUNTER — Ambulatory Visit (INDEPENDENT_AMBULATORY_CARE_PROVIDER_SITE_OTHER): Payer: Medicare Other | Admitting: Sports Medicine

## 2019-08-20 VITALS — BP 122/72 | Ht 62.0 in | Wt 130.0 lb

## 2019-08-20 DIAGNOSIS — M6530 Trigger finger, unspecified finger: Secondary | ICD-10-CM | POA: Diagnosis not present

## 2019-08-20 MED ORDER — METHYLPREDNISOLONE ACETATE 40 MG/ML IJ SUSP
40.0000 mg | Freq: Once | INTRAMUSCULAR | Status: AC
  Administered 2019-08-20: 40 mg via INTRA_ARTICULAR

## 2019-08-20 NOTE — Progress Notes (Signed)
  Denise Wolfe - 79 y.o. female MRN [id number]  Date of birth: Jul 06, 1941  SUBJECTIVE:   CC: right hand 4th finger trigger finger    79 yo female presenting with 4th digit trigger finger. She reports that it has been bothering her for the past year. Recently, it has been getting "stuck" more frequently. It has been getting worse over time. It mainly occurs when she is using her hands. Does not occur at night/while sleeping. She has not tried any interventions.   ROS: No unexpected weight loss, fever, chills, swelling, instability, muscle pain, numbness/tingling, redness, otherwise see HPI   PMHx - Updated and reviewed.  Contributory factors include: Negative PSHx - Updated and reviewed.  Contributory factors include:  Negative FHx - Updated and reviewed.  Contributory factors include:  Negative Social Hx - Updated and reviewed. Contributory factors include: Negative Medications - reviewed   DATA REVIEWED: none  PHYSICAL EXAM:  VS: BP:122/72  HR: bpm  TEMP: ( )  RESP:   HT:5\' 2"  (157.5 cm)   WT:130 lb (59 kg)  BMI:23.77 PHYSICAL EXAM: Gen: NAD, alert, cooperative with exam, well-appearing HEENT: clear conjunctiva,  CV:  no edema, capillary refill brisk, normal rate Resp: non-labored Skin: no rashes, normal turgor  Neuro: no gross deficits.  Psych:  alert and oriented  Hand: Inspection: No obvious deformity. No swelling, erythema or bruising Palpation: no TTP. Nodule palpated at 4th digit A1 pulley; able to feel it stick. ROM: Full ROM of the digits and wrist. Fully able to extend and flex finger. Strength: 5/5 strength in the forearm, wrist and interosseus muscles Neurovascular: NV intact    ASSESSMENT & PLAN:  4th digit trigger finger- corticosteroid injection today. Discussed option to repeat injection if she has relief and then it returns. After 2 injections, could consider surgery. Return as needed.   After informed written consent timeout was performed and patient  was seated in chair of exam room.  Area overlying right 4th digit A1 pulley prepped with alcohol swab then injected with 0.1:0.1 mL lidocaine: depomedrol.  Patient tolerated procedure well without immediate complications.  Patient seen and evaluated with the sports medicine fellow.  I agree with the above plan of care.  Cortisone injection administered as above.  Follow-up for ongoing or recalcitrant issues.

## 2019-09-09 ENCOUNTER — Ambulatory Visit (INDEPENDENT_AMBULATORY_CARE_PROVIDER_SITE_OTHER): Payer: Medicare Other | Admitting: Family Medicine

## 2019-09-09 ENCOUNTER — Other Ambulatory Visit: Payer: Self-pay

## 2019-09-09 ENCOUNTER — Encounter: Payer: Self-pay | Admitting: Family Medicine

## 2019-09-09 VITALS — BP 127/80 | HR 80 | Temp 97.6°F | Resp 12 | Ht 62.0 in | Wt 140.0 lb

## 2019-09-09 DIAGNOSIS — R42 Dizziness and giddiness: Secondary | ICD-10-CM | POA: Diagnosis not present

## 2019-09-09 DIAGNOSIS — F430 Acute stress reaction: Secondary | ICD-10-CM

## 2019-09-09 DIAGNOSIS — E7889 Other lipoprotein metabolism disorders: Secondary | ICD-10-CM

## 2019-09-09 DIAGNOSIS — E782 Mixed hyperlipidemia: Secondary | ICD-10-CM

## 2019-09-09 DIAGNOSIS — H8110 Benign paroxysmal vertigo, unspecified ear: Secondary | ICD-10-CM

## 2019-09-09 DIAGNOSIS — F988 Other specified behavioral and emotional disorders with onset usually occurring in childhood and adolescence: Secondary | ICD-10-CM | POA: Diagnosis not present

## 2019-09-09 DIAGNOSIS — I10 Essential (primary) hypertension: Secondary | ICD-10-CM | POA: Diagnosis not present

## 2019-09-09 DIAGNOSIS — F339 Major depressive disorder, recurrent, unspecified: Secondary | ICD-10-CM | POA: Diagnosis not present

## 2019-09-09 MED ORDER — AZELASTINE HCL 0.1 % NA SOLN: 1.0000 | Freq: Two times a day (BID) | NASAL | 5 refills | Status: DC

## 2019-09-09 MED ORDER — FLUTICASONE PROPIONATE 50 MCG/ACT NA SUSP: NASAL | 5 refills | Status: DC

## 2019-09-09 MED ORDER — LOSARTAN POTASSIUM-HCTZ 50-12.5 MG PO TABS: 1.0000 | ORAL_TABLET | Freq: Every day | ORAL | 1 refills | Status: DC

## 2019-09-09 NOTE — Progress Notes (Signed)
Impression and Recommendations:    1. Mixed hyperlipidemia   2. HTN (hypertension), benign   3. Elevated HDL:  >er 90   4. Depression, recurrent ([place])   5. 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 Attention deficit disorder, unspecified hyperactivity presence   7. Dizziness of unknown cause- improving   8. Benign paroxysmal positional vertigo, unspecified laterality      Ongoing Dizziness of Unknown Cause although much improved - Benign Positional Vertigo, Eustachian Tube Dysfunction - Last seen end of December 2020.  Urgent MRI ordered last OV; found ischemic changes consistent with 79 year old, but otherwise WNL, no acute or subacute findings.  Reviewed findings with patient today and provided education regarding results of MRI.  - Discussed patient's ongoing symptoms of dizziness. - Advised referral to vestibular rehab for benign positional vertigo. - Extensive education provided to patient today and all questions answered.  - Ambulatory referral to vestibular rehab provided today.  - Discussed symptoms of eustachian tube dysfunction with patient today.  - Advised patient to begin Flonase daily. - To help reduce risks of nosebleeds, encouraged patient to utilize a humidifier during drier weather.  - Astelin provided to patient today.  Discussed prudent daily use.  - Advised the patient to begin using AYR or Neilmed sinus rinses BID followed by nasal sprays BID (one spray to each nostril).  Advised that the patient may also incorporate OTC allergy med.  - Dicussed that symptoms/stuffiness should improve with appropriate use of medications as advised.  - Will continue to monitor.   Hypertension, benign - BP overall running WNL on average.  - Per patient, kept track of her BP to rule-out orthostatic hypotension.  No signs of orthostatic hypotension appreciated through BP and pulse log.  - Educated patient regarding causes of lower or higher BP.  - Patient will  continue current treatment regimen.  - Discontinued individual medications and began combination pill.  See med list.  - Counseled patient on pathophysiology of disease and discussed various treatment options, which always includes dietary and lifestyle modification as first line.   - Lifestyle changes such as dash and heart healthy diets and engaging in a regular exercise program discussed extensively with patient.   - Ambulatory blood pressure monitoring encouraged at least 3 times weekly.  Keep log and bring in every office visit.  Reminded patient that if they ever feel poorly in any way, to check their blood pressure and pulse.  - We will continue to monitor.   Mixed Hyperlipidemia CHolesterol levels at goal last check.  Pt will continue current treatment regimen.  See med list.  Dietary changes such as low saturated & trans fat diets for hyperlipidemia and low carb/ ketogenic diets for hypertriglyceridemia discussed with patient.    Encouraged patient to follow AHA guidelines for regular exercise.  Educational handouts provided at patient's desire and/ or told to look online at the [employer] website for further information.  We will continue to monitor and re-check as discussed.   Depression, Mixed Disorder as a Reaction to Stress - Stable at this time. - Continue management as established. - Will continue to monitor.   ADD - Stable at this time. - Continue management as established. - Will continue to monitor.   Lifestyle & Preventative Health Maintenance - Advised patient to continue working toward exercising to improve overall mental, physical, and emotional health.    - Reviewed the "spokes of the wheel" of mood and  health management.  Stressed the importance of ongoing prudent habits, including regular exercise, appropriate sleep hygiene, healthful dietary habits, and prayer/meditation to relax.  - Encouraged patient to engage in daily physical  activity, especially a formal exercise routine.  Recommended that the patient eventually strive for at least 150 minutes of moderate cardiovascular activity per week according to guidelines established by the [facility].   - Healthy dietary habits encouraged, including low-carb, and high amounts of lean protein in diet.   - Patient should also consume adequate amounts of water.  - Health counseling performed.  All questions answered.   COVID-19 Vaccination Counseling - Education and counseling provided to patient today. - Lengthy conversation held with patient during appointment. - Advised patient to visit [place] website or the [place] online every morning.  Discussed available resources. - Encouraged patient to sign up in any county that is available, and call and express all applicable concerns.  Patient was interviewed and evaluated by me/ [facility] staff members in the clinic today for 40+ minutes, with over 50% of my time spent in face to face counseling of patients various medical conditions, treatment plans of those medical conditions including medicine management and lifestyle modification, strategies to improve health and well being; and in coordination of care.   SEE TREATMENT PLAN FOR DETAILS   Orders Placed This Encounter  Procedures  . Ambulatory referral to Physical Therapy     Meds ordered this encounter  Medications  . fluticasone (FLONASE) 50 MCG/ACT nasal spray    Sig: 1 spray each nostril following sinus rinses twice daily    Dispense:  16 g    Refill:  5  . azelastine (ASTELIN) 0.1 % nasal spray    Sig: Place 1 spray into both nostrils 2 (two) times daily. 1 spray each nostril twice daily after sinus rinses    Dispense:  30 mL    Refill:  5  . losartan-hydrochlorothiazide (HYZAAR) 50-12.5 MG tablet    Sig: Take 1 tablet by mouth daily.    Dispense:  90 tablet    Refill:  1     Medications Discontinued During This Encounter    Medication Reason  . losartan (COZAAR) 50 MG tablet   . hydrochlorothiazide (MICROZIDE) 12.5 MG capsule       Gross side effects, risk and benefits, and alternatives of medications and treatment plan in general discussed with patient.  Patient is aware that all medications have potential side effects and we are unable to predict every side effect or drug-drug interaction that may occur.   Patient will call with any questions prior to using medication if they have concerns.    Expresses verbal understanding and consents to current therapy and treatment regimen.  No barriers to understanding were identified.  Red flag symptoms and signs discussed in detail.  Patient expressed understanding regarding what to do in case of emergency\urgent symptoms  Please see AVS handed out to patient at the end of our visit for further patient instructions/ counseling done pertaining to today's office visit.   Return for f/up 3 months for BP, HLD, ADD etc.     Note:  This note was prepared with assistance of Dragon voice recognition software. Occasional wrong-word or sound-a-like substitutions may have occurred due to the inherent limitations of voice recognition software.   This document serves as a record of services personally performed by [name], DO. It was created on her behalf by [name], a trained  medical scribe. The creation of this record is based on the scribe's personal observations and the provider's statements to them.   This case required medical decision making of at least moderate complexity. The above documentation has been reviewed to be accurate and was completed by Marjory Sneddon, D.O.     --------------------------------------------------------------------------------------------------------------------------------------------------------------------------------------------------------------------------------------------    Subjective:     Denise Wolfe, am serving as scribe for Dr. [name].    HPI: Denise Wolfe is a 79 y.o. female who presents to [place] at [facility] today for issues as discussed below.   - Mood Management Feels her mood is fine despite challenges this year.   - COVID-19 Vaccination She has had some difficulty obtaining COVID-19 vaccination.   - Ongoing Dizziness Per patient, regarding dizziness; "not anything dramatic, let's put it that way."  Says "occasoinally, a few times per week."  Before, notes it was "really disturbing."  Has a bit of a room spinning sensation.  Occurs mainly sitting to standing, or leaning over and standing up.  Notes went on a stepstool to hang a picture, started coming down and dizziness occurred.  Doesn't feel that her dizziness hinders her life; "it just worries me a little bit."  Confirms feeling stuffier in the mornings, and when her symptoms have been worst, have been "first thing in the morning."   - ADD Notes no difference in her symptoms of dizziness while managed on Adderall.   - Insomnia Notes "I don't sleep well.  I don't sleep through the night, I wake up."  It's been this way a long time, after her breast cancer.  States "started waking up on the dot at 3:30 AM," and "that really hasn't changed in a while."  Says some nights she sleeps for a couple of hours, wakes up, sleeps for a couple more hours, and wakes up.   HPI:  Hypertension:  -  Her blood pressure at home has been running: on average 119-156/59-92.   With the mean or average being 136/84  Pulse has been consistently upper 60's, 70's, and 80's.  Checked every morning before she got out of bed, then put her feet on the floor and took her BP again.  - Patient reports good compliance with medication and/or lifestyle modification  - Her denies acute concerns or problems related to treatment plan  - She denies new onset of: chest pain, exercise intolerance, shortness of  breath, dizziness, visual changes, headache, lower extremity swelling or claudication.   Last 3 blood pressure readings in our office are as follows: BP Readings from Last 3 Encounters:  09/09/19 127/80  08/20/19 122/72  08/13/19 (!) 141/80   Filed Weights   09/09/19 1003  Weight: 140 lb (63.5 kg)    HPI:  Hyperlipidemia:  79 y.o. female here for cholesterol follow-up.   - Patient reports good compliance with treatment plan of:  medication and/ or lifestyle management.    - Patient denies any acute concerns or problems with management plan   - She denies new onset of: myalgias, arthralgias, increased fatigue more than normal, chest pains, exercise intolerance, shortness of breath, dizziness, visual changes, headache, lower extremity swelling or claudication.   Most recent cholesterol panel was:  Lab Results  Component Value Date   CHOL 171 08/11/2019   HDL 76 08/11/2019   LDLCALC 83 08/11/2019   TRIG 61 08/11/2019   CHOLHDL 2.3 08/11/2019   Hepatic Function Latest Ref Rng & Units 08/11/2019 07/31/2018 01/28/2018  Total Protein 6.0 - 8.5 g/dL 6.3 7.2 6.8  Albumin 3.7 - 4.7 g/dL 4.3 4.1 4.1  AST 0 - 40 IU/L '19 18 18  '$ ALT 0 - 32 IU/L '11 13 9  '$ Alk Phosphatase 39 - 117 IU/L 46 50 43  Total Bilirubin 0.0 - 1.2 mg/dL <0.2 0.3 0.3      Wt Readings from Last 3 Encounters:  09/09/19 140 lb (63.5 kg)  08/20/19 130 lb (59 kg)  08/13/19 133 lb (60.3 kg)   BP Readings from Last 3 Encounters:  09/09/19 127/80  08/20/19 122/72  08/13/19 (!) 141/80   Pulse Readings from Last 3 Encounters:  09/09/19 80  08/13/19 71  01/28/19 72   BMI Readings from Last 3 Encounters:  09/09/19 25.61 kg/m  08/20/19 23.78 kg/m  08/13/19 24.33 kg/m     Patient Care Team    Relationship Specialty Notifications Start End  [name], DO PCP - General Family Medicine  05/10/16   Silverio Decamp, MD Consulting Physician Family Medicine  07/25/16    Comment: OA - shoulder  injeciton  Truitt Merle, MD Consulting Physician Hematology  33/35/45   Leighton Ruff, MD Consulting Physician General Surgery  07/23/17      Patient Active Problem List   Diagnosis Date Noted  . Squamous cell carcinoma of rectum ([place]) 02/14/2017  . HLD (hyperlipidemia) 07/19/2016  . Elevated HDL:  >er 90 07/19/2016  . History of breast Cancer 05/10/2016  . HTN (hypertension), benign 05/10/2016  . Breast cancer ([place]) 03/13/2000  . 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 Depression, recurrent ([place]) 05/10/2016  . Chemotherapy induced neutropenia ([place]) 04/14/2017  . Vitamin D insufficiency 07/19/2016  . Degenerative joint disease (DJD) of sternoclavicular joint, right 07/19/2016  . Pain in joint, shoulder region 05/10/2016  . Dizziness on standing 08/13/2019  . B12 deficiency 08/13/2019  . Personal history of radiation therapy 09/30/2018  . Personal history of chemotherapy 09/30/2018  . Anemia due to antineoplastic chemotherapy 05/14/2017  . Pneumonia of left lower lobe due to infectious organism 05/09/2017  . Difficulty sleeping 05/09/2017  . Burning sensation- scalp esp at night 05/09/2017  . Wheeze 05/09/2017  . Squamous cell carcinoma of anal canal ([place]) 04/11/2017  . Colon cancer ([place]) 02/11/2017  . Attention deficit disorder 12/27/2016  . Multiple atypical nevi 12/27/2016  . Nonspecific chest pain 08/08/2016  . Retrosternal chest pain 07/19/2016  . Cataract 05/10/2016    Past Medical history, Surgical history, Family history, Social history, Allergies and Medications have been entered into the medical record, reviewed and changed as needed.    Current Meds  Medication Sig  . amphetamine-dextroamphetamine (ADDERALL) 10 MG tablet Take 1 tablet (10 mg total) by mouth every morning.  Marland Kitchen atorvastatin (LIPITOR) 80 MG tablet TAKE ONE-HALF TABLET BY  MOUTH DAILY AT 6 PM.  . cholecalciferol (VITAMIN D) 400 units TABS tablet Take 400 Units by mouth daily. Patient is not  sure of the units.  Marland Kitchen sertraline (ZOLOFT) 50 MG tablet TAKE 1 AND 1/2 TABLETS BY  MOUTH DAILY  . vitamin B-12 (CYANOCOBALAMIN) 500 MCG tablet Take 1 tablet (500 mcg total) by mouth daily.  . [DISCONTINUED] hydrochlorothiazide (MICROZIDE) 12.5 MG capsule TAKE 1 CAPSULE BY MOUTH  DAILY  . [DISCONTINUED] losartan (COZAAR) 50 MG tablet Take 1 tablet (50 mg total) by mouth daily.   Current Facility-Administered Medications for the 09/09/19 encounter (Office Visit) with [name], DO  Medication  . 0.9 %  sodium chloride infusion  Allergies:  Allergies  Allergen Reactions  . Ibuprofen Rash     Review of Systems:  A fourteen system review of systems was performed and found to be positive as per HPI.   Objective:   Blood pressure 127/80, pulse 80, temperature 97.6 F (36.4 C), temperature source Oral, resp. rate 12, height '5\' 2"'$  (1.575 m), weight 140 lb (63.5 kg), SpO2 100 %. Body mass index is 25.61 kg/m. General:  Well Developed, well nourished, appropriate for stated age.  Neuro:  Alert and oriented,  extra-ocular muscles intact  HEENT:  Normocephalic, atraumatic, neck supple, no carotid bruits appreciated  Ears: No effusion or abnormality appreciated, but both TM's with slight protrusion/buldge. Skin:  no gross rash, warm, pink. Cardiac:  RRR, S1 S2 Respiratory:  ECTA B/L and A/P, Not using accessory muscles, speaking in full sentences- unlabored. Vascular:  Ext warm, no cyanosis apprec.; cap RF less 2 sec. Psych:  No HI/SI, judgement and insight good, Euthymic mood. Full Affect.

## 2019-10-09 ENCOUNTER — Ambulatory Visit: Payer: Medicare Other | Attending: Family Medicine

## 2019-10-09 ENCOUNTER — Other Ambulatory Visit: Payer: Self-pay

## 2019-10-09 DIAGNOSIS — R2689 Other abnormalities of gait and mobility: Secondary | ICD-10-CM | POA: Diagnosis not present

## 2019-10-09 DIAGNOSIS — R2681 Unsteadiness on feet: Secondary | ICD-10-CM | POA: Diagnosis not present

## 2019-10-09 DIAGNOSIS — R42 Dizziness and giddiness: Secondary | ICD-10-CM | POA: Diagnosis not present

## 2019-10-09 NOTE — Therapy (Addendum)
[facility] [address] [place], [place], [address] Phone: [phone]   Fax:  [phone]  Physical Therapy Evaluation  Patient Details  Name: Denise Wolfe MRN: [id number] Date of Birth: [birth year] Referring Provider (PT): Dr. Jerelene Redden   Encounter Date: 10/09/2019  PT End of Session - 10/09/19 1039    Visit Number  1    Number of Visits  9    Date for PT Re-Evaluation  12/08/19    Authorization Type  Medicare: progress note every 10th visit.    PT Start Time  0932    PT Stop Time  1017    PT Time Calculation (min)  45 min    Equipment Utilized During Treatment  Other (comment)   S prn   Activity Tolerance  Patient tolerated treatment well    Behavior During Therapy  WFL for tasks assessed/performed       Past Medical History:  Diagnosis Date  . Anemia    during childhood  . Arthritis    hands, shoulder  . Breast cancer ([place]) 03/2000   left side modified mastecomy  . Cancer [facility])    breast, age 27; left side  . Colon cancer ([place]) 02/11/2017   rectum-squamous cell carcinoma  . Depression   . Hypertension   . Personal history of chemotherapy   . Personal history of radiation therapy   . 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 ABDOMINAL HYSTERECTOMY     With bilateral oophorectomy. 2002.  [facility] BREAST BIOPSY Right 2004   benign  . BREAST SURGERY Left 2001   biopsy  . CATARACT EXTRACTION Bilateral 2017  . IR FLUORO GUIDE CV LINE RIGHT  03/11/2017  . IR FLUORO GUIDE CV LINE RIGHT  04/16/2017  . IR US GUIDE VASC ACCESS RIGHT  03/11/2017  . IR US GUIDE VASC ACCESS RIGHT  04/16/2017  . MASTECTOMY MODIFIED RADICAL Left 2001  . MENISCUS DEBRIDEMENT Right 2003  . TUMOR EXCISION Right    thigh area    There were no vitals filed for this visit.   Subjective Assessment - 10/09/19 0943    Subjective  Pt stated dizziness began approx. one year  ago. Pt describes onset as sudden without fall or accident prior to dizziness and seems to be getting worse. Pt has experienced one fall in the last year 2/2 dizziness while getting up in the morning from bed. Pt describes dizziness as everything tilting, lasting for seconds. Dizziness at worst: 5/10, at best: 0/10, except for one incident of 10/10 dizziness. Pt denied severe HA pain, weakness, N/T. Pt does have tinnitus but is unsure if it started before the dizziness. Dizziness is worse when transferring from sit stand, bending forward, going up the ladder. Pt denied LOH.    Pertinent History  **BP ONLY IN RIGHT UE 2/2 HX OF L BREAST CA AND L MASTECTOMY, HTN, colon CA with bowel resection-not currently receiving chemo/radiation but has in the past, DDD, B cataract with extraction, Vitamin D insufficiency, HLD, depression, R shoulder joint pain, retrosternal pain, ADD, anemia during CA treatment, arthritis    Patient Stated Goals  To not get dizzy.    Currently in Pain?  No/denies         [facility] PT Assessment - 10/09/19 0951      Assessment   Medical Diagnosis  BPPV, dizziness of unknown cause    Referring Provider (PT)  Dr. Jerelene Redden  Onset Date/Surgical Date  10/08/18   approx one year ago   Hand Dominance  Right    Prior Therapy  none for dizziness      Precautions   Precautions  Fall      Restrictions   Weight Bearing Restrictions  No      Balance Screen   Has the patient fallen in the past 6 months  Yes    How many times?  1    Has the patient had a decrease in activity level because of a fear of falling?   No    Is the patient reluctant to leave their home because of a fear of falling?   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     Leisure  She's an Training and development officer, and also enjoys wood working and different mediums.       Cognition   Overall Cognitive Status  Within Functional Limits for tasks assessed      Sensation   Light Touch  Appears Intact      Ambulation/Gait   Ambulation/Gait  Yes    Ambulation/Gait Assistance  5: Supervision    Ambulation/Gait Assistance Details  S to ensure safety. Amb. in guarded manner.     Ambulation Distance (Feet)  100 Feet    Assistive device  None    Gait Pattern  Within Functional Limits;Step-through pattern;Decreased arm swing - right;Decreased arm swing - left;Decreased stride length;Decreased trunk rotation    Ambulation Surface  Level;Indoor    Gait velocity  3.67f/sec and 3.494fsec.           Vestibular Assessment - 10/09/19 1001      Vestibular Assessment   General Observation  No hx of dizziness prior to recent episodes.      Symptom Behavior   Subjective history of current problem  See subjective assesement.    Type of Dizziness   Comment;Unsteady with head/body turns   and tilting   Frequency of Dizziness  About daily    Duration of Dizziness  A few seconds and then wooziness for a few mintues.     Symptom Nature  Positional    Aggravating Factors  Supine to sit;Sit to stand;Comment;Forward bending   going up the ladder   Relieving Factors  Rest;Slow movements    Progression of Symptoms  Worse      Oculomotor Exam   Oculomotor Alignment  Abnormal   L hypertropia   Spontaneous  Absent    Gaze-induced   Absent    Head shaking Horizontal  Absent   difficult to assess 2/2 guarding at neck   Smooth Pursuits  Saccades   difficulty tracking c pt stating she has to concentrate    Saccades  Overshoots    Comment  Pt  reported mild dizziness during R sided saccades. Convergence: WNL-diplopia reported 1" from nose.       Vestibulo-Ocular Reflex   VOR 1 Head Only (x 1 viewing)  Pt reported mild dizziness at end of horizontal VOR.     VOR Cancellation  Normal      Positional Testing   Dix-Hallpike  Dix-Hallpike Right;Dix-Hallpike Left    Horizontal Canal Testing  Horizontal Canal Right;Horizontal Canal Left      Dix-Hallpike Right   Dix-Hallpike Right Duration  0    Dix-Hallpike Right Symptoms  No nystagmus      Dix-Hallpike Left   Dix-Hallpike Left Duration  0    Dix-Hallpike Left Symptoms  Other (comment)   difficult to assess 2/2 low amplitude, see assessment     Horizontal Canal Right   Horizontal Canal Right Duration  0    Horizontal Canal Right Symptoms  Other (comment)   see assessment     Horizontal Canal Left   Horizontal Canal Left Duration  0    Horizontal Canal Left Symptoms  Other (comment)   see assessment     Positional Sensitivities   Up from Right Hallpike  Lightheadedness    Up from Left Hallpike  Lightheadedness          Objective measurements completed on examination: See above findings.              PT Education - 10/09/19 1039    Education Details  PT discussed exam findings, POC, frequency, and duration.    Person(s) Educated  Patient    Methods  Explanation    Comprehension  Verbalized understanding       PT Short Term Goals - 10/09/19 1050      PT SHORT TERM GOAL #1   Title  same as LTGs        PT Long Term Goals - 10/09/19 1050      PT LONG TERM GOAL #1   Title  Pt will be IND with HEP to improve dizziness and balance to improve safety. TARGET DATE FOR ALL LTGS:11/06/19    Time  4    Period  Weeks    Status  New      PT LONG TERM GOAL #2   Title  Pt will report dizziness </=2/10 at worst to improve QOL and improve functional mobility    Time  4    Period  Weeks    Status  New      PT LONG  TERM GOAL #3   Title  Perform SOT and FGA and write goals as indicated.    Time  4    Period  Weeks    Status  New      PT LONG TERM GOAL #4   Title  Continue to monitor for BPPV and treat as indicated.    Time  4    Period  Weeks    Status  New      PT LONG TERM GOAL #5   Title  Pt will amb. 1000' over even/uneven terrain, IND, while performing head turns/nods without LOB to safely amb. in the community.    Time  4    Period  Weeks    Status  New             Plan - 10/09/19 1040    Clinical Impression Statement  Pt presenting to OPPT neuro for dizziness. Pt's PMH is significant for the following: HTN, colon CA with bowel resection-not currently  receiving chemo/radiation but has in the past, DDD, B cataract with extraction, Vitamin D insufficiency, HLD, depression, R shoulder joint pain, retrosternal pain, ADD, anemia during CA treatment, arthritis. Pt's gait speed was WNL but PT will formally assess balance via SOT and FGA next session 2/2 pt reporting one fall in last six months due to dizziness. Pt denied concordant dizziness during positional testing but pt noted to experience geotropic nystagus during all positional testing-imaging negative for acute/sub acute infarct or tumors (per chart). Pt with hx of chemo and radiation for breast and colon CAs in 2017 and 2018. Pt's MRI indicated (per chart) moderate small vessel chronic ischemic changes to brain, including cerebellum. Pt reported mild concordant dizziness during VOR and during supine to sit txfs, indicating dizziness is likely multi-factorial in nature.The following impairments were noted upon exam: gait deviations, dizziness, imbalance.  Pt would benefit from skilled PT to improve dizziness and safety during functional mobility.    Personal Factors and Comorbidities  Age;Comorbidity 3+;Past/Current Experience;Profession   pt stated she was reluctant to schedule appt, not knowing what PT could do   Comorbidities  HTN, colon  CA with bowel resection-not currently receiving chemo/radiation but has in the past, DDD, B cataract with extraction, Vitamin D insufficiency, HLD, depression, R shoulder joint pain, retrosternal pain, ADD, anemia during CA treatment, arthritis    Examination-Activity Limitations  Bed Mobility;Locomotion Level;Reach Overhead;Transfers;Stand;Stairs    Examination-Participation Restrictions  Community Activity    Stability/Clinical Decision Making  Evolving/Moderate complexity    Clinical Decision Making  Moderate    Rehab Potential  Good    PT Frequency  2x / week    PT Duration  4 weeks    PT Treatment/Interventions  ADLs/Self Care Home Management;Biofeedback;Canalith Repostioning;DME Instruction;Gait Scientist, forensic;Therapeutic activities;Therapeutic exercise;Balance training;Neuromuscular re-education;Orthotic Fit/Training;Functional mobility training;Patient/family education;Manual techniques;Vestibular    PT Next Visit Plan  **BP ONLY IN RIGHT UE 2/2 HX OF L BREAST CA WITH L MASTECTOMY, Assess for Orthostatics, Perform SOT and FGA and write goals as indicated. Initiate HEP for balance and x1 viewing.    Consulted and Agree with Plan of Care  Patient       Patient will benefit from skilled therapeutic intervention in order to improve the following deficits and impairments:  Abnormal gait, Dizziness, Decreased balance, Decreased knowledge of use of DME  Visit Diagnosis: Dizziness and giddiness - Plan: PT plan of care cert/re-cert  Other abnormalities of gait and mobility - Plan: PT plan of care cert/re-cert  Unsteadiness on feet - Plan: PT plan of care cert/re-cert     Problem List Patient Active Problem List   Diagnosis Date Noted  . Dizziness on standing 08/13/2019  . B12 deficiency 08/13/2019  . Personal history of radiation therapy 09/30/2018  . Personal history of chemotherapy 09/30/2018  . Anemia due to antineoplastic chemotherapy 05/14/2017  . Pneumonia of left  lower lobe due to infectious organism 05/09/2017  . Difficulty sleeping 05/09/2017  . Burning sensation- scalp esp at night 05/09/2017  . Wheeze 05/09/2017  . Chemotherapy induced neutropenia ([place]) 04/14/2017  . Squamous cell carcinoma of anal canal ([place]) 04/11/2017  . Squamous cell carcinoma of rectum ([place]) 02/14/2017  . Colon cancer ([place]) 02/11/2017  . Attention deficit disorder 12/27/2016  . Multiple atypical nevi 12/27/2016  . Nonspecific chest pain 08/08/2016  . 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 Vitamin D insufficiency 07/19/2016  . HLD (hyperlipidemia) 07/19/2016  . Retrosternal chest pain 07/19/2016  . Degenerative joint disease (DJD) of sternoclavicular  joint, right 07/19/2016  . Elevated HDL:  >er 90 07/19/2016  . History of breast Cancer 05/10/2016  . Depression, recurrent ([place]) 05/10/2016  . Cataract 05/10/2016  . Pain in joint, shoulder region 05/10/2016  . HTN (hypertension), benign 05/10/2016  . Breast cancer ([place]) 03/13/2000    Denise Wolfe L 10/09/2019, 10:55 AM   Geoffry Paradise, PT,DPT 10/09/19 10:56 AM Phone: [phone] Fax: [facility] [address] [place], [place], [address] Phone: [phone]   Fax:  [phone]  Name: Denise Wolfe MRN: [id number] Date of Birth: 1941/05/01

## 2019-10-15 ENCOUNTER — Ambulatory Visit: Payer: Medicare Other | Attending: Family Medicine

## 2019-10-15 ENCOUNTER — Other Ambulatory Visit: Payer: Self-pay

## 2019-10-15 DIAGNOSIS — R42 Dizziness and giddiness: Secondary | ICD-10-CM

## 2019-10-15 DIAGNOSIS — R2681 Unsteadiness on feet: Secondary | ICD-10-CM | POA: Diagnosis not present

## 2019-10-15 DIAGNOSIS — R2689 Other abnormalities of gait and mobility: Secondary | ICD-10-CM | POA: Diagnosis not present

## 2019-10-15 NOTE — Therapy (Signed)
[facility] [address] [place], [place], [address] Phone: [phone]   Fax:  [phone]  Physical Therapy Treatment  Patient Details  Name: Denise Wolfe MRN: [id number] Date of Birth: 1941/08/04 Referring Provider (PT): Dr. Jerelene Redden   Encounter Date: 10/15/2019  PT End of Session - 10/15/19 1635    Visit Number  2    Number of Visits  9    Date for PT Re-Evaluation  12/08/19    Authorization Type  Medicare: progress note every 10th visit.    PT Start Time  1455   pt late   PT Stop Time  1528    PT Time Calculation (min)  33 min    Equipment Utilized During Treatment  Other (comment)   S to ensure safety   Activity Tolerance  Patient tolerated treatment well    Behavior During Therapy  [facility] for tasks assessed/performed       Past Medical History:  Diagnosis Date  . Anemia    during childhood  . Arthritis    hands, shoulder  . Breast cancer ([place]) 03/2000   left side modified mastecomy  . Cancer [facility])    breast, age 75; left side  . Colon cancer ([place]) 02/11/2017   rectum-squamous cell carcinoma  . Depression   . Hypertension   . Personal history of chemotherapy   . Personal history of radiation therapy   . Squamous cell carcinoma of rectum ([place]) 02/14/2017    Past Surgical History:  Procedure Laterality Date  . ABDOMINAL HYSTERECTOMY     With bilateral oophorectomy. 2002.  [place] Kitchen BREAST BIOPSY Right 2004   benign  . BREAST SURGERY Left 2001   biopsy  . CATARACT EXTRACTION Bilateral 2017  . IR FLUORO GUIDE CV LINE RIGHT  03/11/2017  . IR FLUORO GUIDE CV LINE RIGHT  04/16/2017  . IR US GUIDE VASC ACCESS RIGHT  03/11/2017  . IR US GUIDE VASC ACCESS RIGHT  04/16/2017  . MASTECTOMY MODIFIED RADICAL Left 2001  . MENISCUS DEBRIDEMENT Right 2003  . TUMOR EXCISION Right    thigh area    There were no vitals filed for this visit.  Subjective Assessment - 10/15/19 1457    Subjective  Pt denied falls since last  visit. Pt reported a dizzy spell this morning while working on an [employer], she bent forward and looked left, dizzy lasted approx. <1 minute. Dizziness:4-5/10, and unsteady afterwards.    Pertinent History  HTN, colon CA with bowel resection-not currently receiving chemo/radiation but has in the past, DDD, B cataract with extraction, Vitamin D insufficiency, HLD, depression, R shoulder joint pain, retrosternal pain, ADD, anemia during CA treatment, arthritis    Patient Stated Goals  To not get dizzy.    Currently in Pain?  No/denies       Neuro re-ed:  Gaze Stabilization: Tip Card  1.Target must remain in focus, not blurry, and appear stationary while head is in motion. 2.Perform exercises with small head movements (45 to either side of midline). 3.Increase speed of head motion so long as target is in focus. 4.If you wear eyeglasses, be sure you can see target through lens (therapist will give specific instructions for bifocal / progressive lenses). 5.These exercises may provoke dizziness or nausea. Work through these symptoms. If too dizzy, slow head movement slightly. Rest between each exercise. 6.Exercises demand concentration; avoid distractions. 7.For safety, perform standing exercises close to a counter, wall, corner, or next to someone.  Copyright  VHI.  All rights reserved.   Gaze Stabilization: Sitting    Keeping eyes on target on wall 3-5 feet away, tilt head down 15-30 and move head side to side for __30__ seconds. Repeat while moving head up and down for _30___ seconds. Do _2-3___ sessions per day.  Copyright  VHI. All rights reserved.   Performed seated with cues and demo for proper technique. Added to HEP.     Orthostatic Hypotension Blood pressure is a measurement of how strongly, or weakly, your blood is pressing against the walls of your arteries. Orthostatic hypotension is a sudden drop in blood pressure that happens when you quickly change positions, such  as when you get up from sitting or lying down. Arteries are blood vessels that carry blood from your heart throughout your body. When blood pressure is too low, you may not get enough blood to your brain or to the rest of your organs. This can cause weakness, light-headedness, rapid heartbeat, and fainting. This can last for just a few seconds or for up to a few minutes. Orthostatic hypotension is usually not a serious problem. However, if it happens frequently or gets worse, it may be a sign of something more serious. What are the causes? This condition may be caused by:  Sudden changes in posture, such as standing up quickly after you have been sitting or lying down.  Blood loss.  Loss of body fluids (dehydration).  Heart problems.  Hormone (endocrine) problems.  Pregnancy.  Severe infection.  Lack of certain nutrients.  Severe allergic reactions (anaphylaxis).  Certain medicines, such as blood pressure medicine or medicines that make the body lose excess fluids (diuretics). Sometimes, this condition can be caused by not taking medicine as directed, such as taking too much of a certain medicine. What increases the risk? The following factors may make you more likely to develop this condition:  Age. Risk increases as you get older.  Conditions that affect the heart or the central nervous system.  Taking certain medicines, such as blood pressure medicine or diuretics.  Being pregnant. What are the signs or symptoms? Symptoms of this condition may include:  Weakness.  Light-headedness.  Dizziness.  Blurred vision.  Fatigue.  Rapid heartbeat.  Fainting, in severe cases. How is this diagnosed? This condition is diagnosed based on:  Your medical history.  Your symptoms.  Your blood pressure measurement. Your health care provider will check your blood pressure when you are: ? Lying down. ? Sitting. ? Standing. A blood pressure reading is recorded as two  numbers, such as "120 over 80" (or 120/80). The first ("top") number is called the systolic pressure. It is a measure of the pressure in your arteries as your heart beats. The second ("bottom") number is called the diastolic pressure. It is a measure of the pressure in your arteries when your heart relaxes between beats. Blood pressure is measured in a unit called mm Hg. Healthy blood pressure for most adults is 120/80. If your blood pressure is below 90/60, you may be diagnosed with hypotension. Other information or tests that may be used to diagnose orthostatic hypotension include:  Your other vital signs, such as your heart rate and temperature.  Blood tests.  Tilt table test. For this test, you will be safely secured to a table that moves you from a lying position to an upright position. Your heart rhythm and blood pressure will be monitored during the test. How is this treated? This condition may be treated by:  Changing  your diet. This may involve eating more salt (sodium) or drinking more water.  Taking medicines to raise your blood pressure.  Changing the dosage of certain medicines you are taking that might be lowering your blood pressure.  Wearing compression stockings. These stockings help to prevent blood clots and reduce swelling in your legs. In some cases, you may need to go to the hospital for:  Fluid replacement. This means you will receive fluids through an IV.  Blood replacement. This means you will receive donated blood through an IV (transfusion).  Treating an infection or heart problems, if this applies.  Monitoring. You may need to be monitored while medicines that you are taking wear off. Follow these instructions at home: Eating and drinking   Drink enough fluid to keep your urine pale yellow.  Eat a healthy diet, and follow instructions from your health care provider about eating or drinking restrictions. A healthy diet includes: ? Fresh fruits and  vegetables. ? Whole grains. ? Lean meats. ? Low-fat dairy products.  Eat extra salt only as directed. Do not add extra salt to your diet unless your health care provider told you to do that.  Eat frequent, small meals.  Avoid standing up suddenly after eating. Medicines  Take over-the-counter and prescription medicines only as told by your health care provider. ? Follow instructions from your health care provider about changing the dosage of your current medicines, if this applies. ? Do not stop or adjust any of your medicines on your own. General instructions   Wear compression stockings as told by your health care provider.  Get up slowly from lying down or sitting positions. This gives your blood pressure a chance to adjust.  Avoid hot showers and excessive heat as directed by your health care provider.  Return to your normal activities as told by your health care provider. Ask your health care provider what activities are safe for you.  Do not use any products that contain nicotine or tobacco, such as cigarettes, e-cigarettes, and chewing tobacco. If you need help quitting, ask your health care provider.  Keep all follow-up visits as told by your health care provider. This is important. Contact a health care provider if you:  Vomit.  Have diarrhea.  Have a fever for more than 2-3 days.  Feel more thirsty than usual.  Feel weak and tired. Get help right away if you:  Have chest pain.  Have a fast or irregular heartbeat.  Develop numbness in any part of your body.  Cannot move your arms or your legs.  Have trouble speaking.  Become sweaty or feel light-headed.  Faint.  Feel short of breath.  Have trouble staying awake.  Feel confused. Summary  Orthostatic hypotension is a sudden drop in blood pressure that happens when you quickly change positions.  Orthostatic hypotension is usually not a serious problem.  It is diagnosed by having your blood  pressure taken lying down, sitting, and then standing.  It may be treated by changing your diet or adjusting your medicines. This information is not intended to replace advice given to you by your health care provider. Make sure you discuss any questions you have with your health care provider. Document Revised: 01/23/2018 Document Reviewed: 01/23/2018 Elsevier Patient Education  [place].       Vestibular Assessment - 10/15/19 1508      Orthostatics   BP supine (x 5 minutes)  163/97   with 32-42cm, 2nd reading c 22-32cm: 148/60mmHg  HR supine (x 5 minutes)  85   second reading: 61   BP sitting  132/79   c lightheadedness reported   HR sitting  85    BP standing (after 1 minute)  128/79   no lightheadedness or dizziness reported   HR standing (after 1 minute)  92    BP standing (after 3 minutes)  126/63    HR standing (after 3 minutes)  56    Orthostatics Comment  Lightheadedness during supine to sit with concordant slight spinning sensation.                       PT Education - 10/15/19 1634    Education Details  PT educated pt on orthostatic findings and to contact MD if dizziness that occurs 2/2 orthostatics worsens or impact safe mobililty/function. PT provided pt with x1 viewing for HEP. Educated pt that next session will entail SOT/FGA and adding to HEP.    Person(s) Educated  Patient    Methods  Explanation;Demonstration;Verbal cues;Handout    Comprehension  Returned demonstration;Verbalized understanding;Need further instruction       PT Short Term Goals - 10/09/19 1050      PT SHORT TERM GOAL #1   Title  same as LTGs        PT Long Term Goals - 10/09/19 1050      PT LONG TERM GOAL #1   Title  Pt will be IND with HEP to improve dizziness and balance to improve safety. TARGET DATE FOR ALL LTGS:11/06/19    Time  4    Period  Weeks    Status  New      PT LONG TERM GOAL #2   Title  Pt will report dizziness </=2/10 at worst to  improve QOL and improve functional mobility    Time  4    Period  Weeks    Status  New      PT LONG TERM GOAL #3   Title  Perform SOT and FGA and write goals as indicated.    Time  4    Period  Weeks    Status  New      PT LONG TERM GOAL #4   Title  Continue to monitor for BPPV and treat as indicated.    Time  4    Period  Weeks    Status  New      PT LONG TERM GOAL #5   Title  Pt will amb. 1000' over even/uneven terrain, IND, while performing head turns/nods without LOB to safely amb. in the community.    Time  4    Period  Weeks    Status  New            Plan - 10/15/19 1636    Clinical Impression Statement  Pt did experience concordant dizziness during orthostatic testing, and experience significant decr. in systolic and diastolic during testing. First reading was high, but PT adjusted to smaller cuff size and BP only slightly elevated but typical per pt report. PT provided pt with x1 viewing to improve VOR and dizziness. PT will assess SOT/FGA next session, as limited by time constraints 2/2 pt arriving late. Pt would continue to benefit from skilled PT to improve safety during functional mobilty.    Personal Factors and Comorbidities  Age;Comorbidity 3+;Past/Current Experience;Profession   pt stated she was reluctant to schedule appt, not knowing what PT could do   Comorbidities  HTN, colon CA  with bowel resection-not currently receiving chemo/radiation but has in the past, DDD, B cataract with extraction, Vitamin D insufficiency, HLD, depression, R shoulder joint pain, retrosternal pain, ADD, anemia during CA treatment, arthritis    Examination-Activity Limitations  Bed Mobility;Locomotion Level;Reach Overhead;Transfers;Stand;Stairs    Examination-Participation Restrictions  Community Activity    Stability/Clinical Decision Making  Evolving/Moderate complexity    Rehab Potential  Good    PT Frequency  2x / week    PT Duration  4 weeks    PT Treatment/Interventions   ADLs/Self Care Home Management;Biofeedback;Canalith Repostioning;DME Instruction;Gait Scientist, forensic;Therapeutic activities;Therapeutic exercise;Balance training;Neuromuscular re-education;Orthotic Fit/Training;Functional mobility training;Patient/family education;Manual techniques;Vestibular    PT Next Visit Plan  BP ONLY IN RUE AS PT WITH HX OF [facility] BREAST CA WITH MASTECTOMY IN 2001. Perform SOT and FGA and write goals as indicated. Initiate HEP for balance    Consulted and Agree with Plan of Care  Patient       Patient will benefit from skilled therapeutic intervention in order to improve the following deficits and impairments:  Abnormal gait, Dizziness, Decreased balance, Decreased knowledge of use of DME  Visit Diagnosis: Dizziness and giddiness  Other abnormalities of gait and mobility     Problem List Patient Active Problem List   Diagnosis Date Noted  . Dizziness on standing 08/13/2019  . B12 deficiency 08/13/2019  . Personal history of radiation therapy 09/30/2018  . Personal history of chemotherapy 09/30/2018  . Anemia due to antineoplastic chemotherapy 05/14/2017  . Pneumonia of left lower lobe due to infectious organism 05/09/2017  . Difficulty sleeping 05/09/2017  . Burning sensation- scalp esp at night 05/09/2017  . Wheeze 05/09/2017  . Chemotherapy induced neutropenia ([place]) 04/14/2017  . Squamous cell carcinoma of anal canal ([place]) 04/11/2017  . Squamous cell carcinoma of rectum ([place]) 02/14/2017  . Colon cancer ([place]) 02/11/2017  . Attention deficit disorder 12/27/2016  . Multiple atypical nevi 12/27/2016  . Nonspecific chest pain 08/08/2016  . 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 Vitamin D insufficiency 07/19/2016  . HLD (hyperlipidemia) 07/19/2016  . Retrosternal chest pain 07/19/2016  . Degenerative joint disease (DJD) of sternoclavicular joint, right 07/19/2016  . Elevated HDL:  >er 90 07/19/2016  . History of breast Cancer 05/10/2016  .  Depression, recurrent ([place]) 05/10/2016  . Cataract 05/10/2016  . Pain in joint, shoulder region 05/10/2016  . HTN (hypertension), benign 05/10/2016  . Breast cancer ([place]) 03/13/2000    Denise Wolfe 10/15/2019, 4:40 PM   Geoffry Paradise, PT,DPT 10/15/19 4:41 PM Phone: [phone] Fax: [facility] [address] [place], [place], [address] Phone: [phone]   Fax:  [phone]  Name: Denise Wolfe MRN: [id number] Date of Birth: 10-18-1940

## 2019-10-15 NOTE — Patient Instructions (Addendum)
Gaze Stabilization: Tip Card  1.Target must remain in focus, not blurry, and appear stationary while head is in motion. 2.Perform exercises with small head movements (45 to either side of midline). 3.Increase speed of head motion so long as target is in focus. 4.If you wear eyeglasses, be sure you can see target through lens (therapist will give specific instructions for bifocal / progressive lenses). 5.These exercises may provoke dizziness or nausea. Work through these symptoms. If too dizzy, slow head movement slightly. Rest between each exercise. 6.Exercises demand concentration; avoid distractions. 7.For safety, perform standing exercises close to a counter, wall, corner, or next to someone.  Copyright  VHI. All rights reserved.   Gaze Stabilization: Sitting    Keeping eyes on target on wall 3-5 feet away, tilt head down 15-30 and move head side to side for __30__ seconds. Repeat while moving head up and down for _30___ seconds. Do _2-3___ sessions per day.  Copyright  VHI. All rights reserved.     Orthostatic Hypotension Blood pressure is a measurement of how strongly, or weakly, your blood is pressing against the walls of your arteries. Orthostatic hypotension is a sudden drop in blood pressure that happens when you quickly change positions, such as when you get up from sitting or lying down. Arteries are blood vessels that carry blood from your heart throughout your body. When blood pressure is too low, you may not get enough blood to your brain or to the rest of your organs. This can cause weakness, light-headedness, rapid heartbeat, and fainting. This can last for just a few seconds or for up to a few minutes. Orthostatic hypotension is usually not a serious problem. However, if it happens frequently or gets worse, it may be a sign of something more serious. What are the causes? This condition may be caused by:  Sudden changes in posture, such as standing up quickly after  you have been sitting or lying down.  Blood loss.  Loss of body fluids (dehydration).  Heart problems.  Hormone (endocrine) problems.  Pregnancy.  Severe infection.  Lack of certain nutrients.  Severe allergic reactions (anaphylaxis).  Certain medicines, such as blood pressure medicine or medicines that make the body lose excess fluids (diuretics). Sometimes, this condition can be caused by not taking medicine as directed, such as taking too much of a certain medicine. What increases the risk? The following factors may make you more likely to develop this condition:  Age. Risk increases as you get older.  Conditions that affect the heart or the central nervous system.  Taking certain medicines, such as blood pressure medicine or diuretics.  Being pregnant. What are the signs or symptoms? Symptoms of this condition may include:  Weakness.  Light-headedness.  Dizziness.  Blurred vision.  Fatigue.  Rapid heartbeat.  Fainting, in severe cases. How is this diagnosed? This condition is diagnosed based on:  Your medical history.  Your symptoms.  Your blood pressure measurement. Your health care provider will check your blood pressure when you are: ? Lying down. ? Sitting. ? Standing. A blood pressure reading is recorded as two numbers, such as "120 over 80" (or 120/80). The first ("top") number is called the systolic pressure. It is a measure of the pressure in your arteries as your heart beats. The second ("bottom") number is called the diastolic pressure. It is a measure of the pressure in your arteries when your heart relaxes between beats. Blood pressure is measured in a unit called mm Hg. Healthy blood  pressure for most adults is 120/80. If your blood pressure is below 90/60, you may be diagnosed with hypotension. Other information or tests that may be used to diagnose orthostatic hypotension include:  Your other vital signs, such as your heart rate and  temperature.  Blood tests.  Tilt table test. For this test, you will be safely secured to a table that moves you from a lying position to an upright position. Your heart rhythm and blood pressure will be monitored during the test. How is this treated? This condition may be treated by:  Changing your diet. This may involve eating more salt (sodium) or drinking more water.  Taking medicines to raise your blood pressure.  Changing the dosage of certain medicines you are taking that might be lowering your blood pressure.  Wearing compression stockings. These stockings help to prevent blood clots and reduce swelling in your legs. In some cases, you may need to go to the hospital for:  Fluid replacement. This means you will receive fluids through an IV.  Blood replacement. This means you will receive donated blood through an IV (transfusion).  Treating an infection or heart problems, if this applies.  Monitoring. You may need to be monitored while medicines that you are taking wear off. Follow these instructions at home: Eating and drinking   Drink enough fluid to keep your urine pale yellow.  Eat a healthy diet, and follow instructions from your health care provider about eating or drinking restrictions. A healthy diet includes: ? Fresh fruits and vegetables. ? Whole grains. ? Lean meats. ? Low-fat dairy products.  Eat extra salt only as directed. Do not add extra salt to your diet unless your health care provider told you to do that.  Eat frequent, small meals.  Avoid standing up suddenly after eating. Medicines  Take over-the-counter and prescription medicines only as told by your health care provider. ? Follow instructions from your health care provider about changing the dosage of your current medicines, if this applies. ? Do not stop or adjust any of your medicines on your own. General instructions   Wear compression stockings as told by your health care provider.   Get up slowly from lying down or sitting positions. This gives your blood pressure a chance to adjust.  Avoid hot showers and excessive heat as directed by your health care provider.  Return to your normal activities as told by your health care provider. Ask your health care provider what activities are safe for you.  Do not use any products that contain nicotine or tobacco, such as cigarettes, e-cigarettes, and chewing tobacco. If you need help quitting, ask your health care provider.  Keep all follow-up visits as told by your health care provider. This is important. Contact a health care provider if you:  Vomit.  Have diarrhea.  Have a fever for more than 2-3 days.  Feel more thirsty than usual.  Feel weak and tired. Get help right away if you:  Have chest pain.  Have a fast or irregular heartbeat.  Develop numbness in any part of your body.  Cannot move your arms or your legs.  Have trouble speaking.  Become sweaty or feel light-headed.  Faint.  Feel short of breath.  Have trouble staying awake.  Feel confused. Summary  Orthostatic hypotension is a sudden drop in blood pressure that happens when you quickly change positions.  Orthostatic hypotension is usually not a serious problem.  It is diagnosed by having your blood pressure taken  lying down, sitting, and then standing.  It may be treated by changing your diet or adjusting your medicines. This information is not intended to replace advice given to you by your health care provider. Make sure you discuss any questions you have with your health care provider. Document Revised: 01/23/2018 Document Reviewed: 01/23/2018 Elsevier Patient Education  [place].

## 2019-10-20 ENCOUNTER — Ambulatory Visit: Payer: Medicare Other | Admitting: Physical Therapy

## 2019-10-20 ENCOUNTER — Other Ambulatory Visit: Payer: Self-pay

## 2019-10-20 ENCOUNTER — Encounter: Payer: Self-pay | Admitting: Physical Therapy

## 2019-10-20 VITALS — BP 137/84 | HR 93

## 2019-10-20 DIAGNOSIS — R2681 Unsteadiness on feet: Secondary | ICD-10-CM | POA: Diagnosis not present

## 2019-10-20 DIAGNOSIS — R42 Dizziness and giddiness: Secondary | ICD-10-CM

## 2019-10-20 DIAGNOSIS — R2689 Other abnormalities of gait and mobility: Secondary | ICD-10-CM | POA: Diagnosis not present

## 2019-10-21 NOTE — Therapy (Signed)
[facility] [address] [place], [place], [address] Phone: [phone]   Fax:  [phone]  Physical Therapy Treatment  Patient Details  Name: Denise Wolfe MRN: [id number] Date of Birth: [birth year] Referring Provider (PT): Dr. Jerelene Redden   Encounter Date: 10/20/2019  PT End of Session - 10/21/19 1922    Visit Number  3    Number of Visits  9    Date for PT Re-Evaluation  12/08/19    Authorization Type  Medicare: progress note every 10th visit.    PT Start Time  1401    PT Stop Time  1443    PT Time Calculation (min)  42 min    Equipment Utilized During Treatment  Other (comment)   S to ensure safety   Activity Tolerance  Patient tolerated treatment well    Behavior During Therapy  WFL for tasks assessed/performed       Past Medical History:  Diagnosis Date  . Anemia    during childhood  . Arthritis    hands, shoulder  . Breast cancer ([place]) 03/2000   left side modified mastecomy  . Cancer [facility])    breast, age 64; left side  . Colon cancer ([place]) 02/11/2017   rectum-squamous cell carcinoma  . Depression   . Hypertension   . Personal history of chemotherapy   . Personal history of radiation therapy   . Squamous cell carcinoma of rectum ([place]) 02/14/2017    Past Surgical History:  Procedure Laterality Date  . ABDOMINAL HYSTERECTOMY     With bilateral oophorectomy. 2002.  Marland Kitchen BREAST BIOPSY Right 2004   benign  . BREAST SURGERY Left 2001   biopsy  . CATARACT EXTRACTION Bilateral 2017  . IR FLUORO GUIDE CV LINE RIGHT  03/11/2017  . IR FLUORO GUIDE CV LINE RIGHT  04/16/2017  . IR US GUIDE VASC ACCESS RIGHT  03/11/2017  . IR US GUIDE VASC ACCESS RIGHT  04/16/2017  . MASTECTOMY MODIFIED RADICAL Left 2001  . MENISCUS DEBRIDEMENT Right 2003  . TUMOR EXCISION Right    thigh area    Vitals:   10/20/19 1359 10/20/19 1401  BP: (!) 138/91 137/84  Pulse: 99 93    Subjective Assessment - 10/20/19 1348    Subjective   Pt reports no dizziness at this time but states she did have dizziness earlier this am - lasted approx. a minute or so and then it goes away;    Patient Stated Goals  To not get dizzy.    Currently in Pain?  No/denies        Sensory Organization Test - composite score 70/100 (WNL's) as N= 65/100  Somatosensory & visual inputs WNL's per SOT; vestibular input slightly decr. With score 44/100 with N=50/100  Condition 1 - all 3 trials WNL's Condition 2 - all 3 trials WNL's Condition 3 - all 3 trials WNL's Condition 4 - trial 1 below N; trials 2 & 3 WNL's Condition 5 - FALL trial 1:  Trials 2 & 3 WNL's Condition 6 - all 3 trials WNL's     Pt was instructed in standing on foam with EO and EC - feet apart and feet together  - with head turns for HEP   Pt able to maintain balance in all 4 positions with head turns with no major LOB                 Balance Exercises - 10/21/19 1921      Balance Exercises:  Standing   Standing Eyes Opened  Narrow base of support (BOS);Wide (BOA);Head turns;Foam/compliant surface;5 reps    Standing Eyes Closed  Narrow base of support (BOS);Wide (BOA);Head turns;Foam/compliant surface;5 reps          PT Short Term Goals - 10/09/19 1050      PT SHORT TERM GOAL #1   Title  same as LTGs        PT Long Term Goals - 10/21/19 1929      PT LONG TERM GOAL #1   Title  Pt will be IND with HEP to improve dizziness and balance to improve safety. TARGET DATE FOR ALL LTGS:11/06/19    Time  4    Period  Weeks    Status  New      PT LONG TERM GOAL #2   Title  Pt will report dizziness </=2/10 at worst to improve QOL and improve functional mobility    Time  4    Period  Weeks    Status  New      PT LONG TERM GOAL #3   Title  Perform SOT and FGA and write goals as indicated.    Baseline  N/A for SOT as composite score is WNL's    Time  4    Period  Weeks    Status  New      PT LONG TERM GOAL #4   Title  Continue to monitor for BPPV and  treat as indicated.    Time  4    Period  Weeks    Status  New      PT LONG TERM GOAL #5   Title  Pt will amb. 1000' over even/uneven terrain, IND, while performing head turns/nods without LOB to safely amb. in the community.    Time  4    Period  Weeks    Status  New            Plan - 10/21/19 1923    Clinical Impression Statement  Pt's dizziness appears to be associated with elevated BP and orthostasis with change in positions including supine to sit and sit to stand.  Pt's composite score on SOT = 70/100, above N of 65/100 for her age population.  Somatosensory and visual inputs are WNL's with vestibular input at approx. 43/100 with N= 50/100.  Pt reports she does not have any significant balance problems, only the dizziness which impacts balance at times.    Personal Factors and Comorbidities  Age;Comorbidity 3+;Past/Current Experience;Profession   pt stated she was reluctant to schedule appt, not knowing what PT could do   Comorbidities  HTN, colon CA with bowel resection-not currently receiving chemo/radiation but has in the past, DDD, B cataract with extraction, Vitamin D insufficiency, HLD, depression, R shoulder joint pain, retrosternal pain, ADD, anemia during CA treatment, arthritis    Examination-Activity Limitations  Bed Mobility;Locomotion Level;Reach Overhead;Transfers;Stand;Stairs    Examination-Participation Restrictions  Community Activity    Stability/Clinical Decision Making  Evolving/Moderate complexity    Rehab Potential  Good    PT Frequency  2x / week    PT Duration  4 weeks    PT Treatment/Interventions  ADLs/Self Care Home Management;Biofeedback;Canalith Repostioning;DME Instruction;Gait Scientist, forensic;Therapeutic activities;Therapeutic exercise;Balance training;Neuromuscular re-education;Orthotic Fit/Training;Functional mobility training;Patient/family education;Manual techniques;Vestibular    PT Next Visit Plan  BP ONLY IN RUE AS PT WITH HX OF L  BREAST CA WITH MASTECTOMY IN 2001. Perform FGA (not done due to time constraints)  and write goals as indicated.  Initiate HEP for balance    PT Home Exercise Plan  balance on foam    Consulted and Agree with Plan of Care  Patient       Patient will benefit from skilled therapeutic intervention in order to improve the following deficits and impairments:  Abnormal gait, Dizziness, Decreased balance, Decreased knowledge of use of DME  Visit Diagnosis: Dizziness and giddiness  Unsteadiness on feet     Problem List Patient Active Problem List   Diagnosis Date Noted  . Dizziness on standing 08/13/2019  . B12 deficiency 08/13/2019  . Personal history of radiation therapy 09/30/2018  . Personal history of chemotherapy 09/30/2018  . Anemia due to antineoplastic chemotherapy 05/14/2017  . Pneumonia of left lower lobe due to infectious organism 05/09/2017  . Difficulty sleeping 05/09/2017  . Burning sensation- scalp esp at night 05/09/2017  . Wheeze 05/09/2017  . Chemotherapy induced neutropenia ([place]) 04/14/2017  . Squamous cell carcinoma of anal canal ([place]) 04/11/2017  . Squamous cell carcinoma of rectum ([place]) 02/14/2017  . Colon cancer ([place]) 02/11/2017  . Attention deficit disorder 12/27/2016  . Multiple atypical nevi 12/27/2016  . Nonspecific chest pain 08/08/2016  . 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 Vitamin D insufficiency 07/19/2016  . HLD (hyperlipidemia) 07/19/2016  . Retrosternal chest pain 07/19/2016  . Degenerative joint disease (DJD) of sternoclavicular joint, right 07/19/2016  . Elevated HDL:  >er 90 07/19/2016  . History of breast Cancer 05/10/2016  . Depression, recurrent ([place]) 05/10/2016  . Cataract 05/10/2016  . Pain in joint, shoulder region 05/10/2016  . HTN (hypertension), benign 05/10/2016  . Breast cancer ([place]) 03/13/2000    Craig Wisnewski, [place], PT 10/21/2019, 7:31 PM  [facility] [address] [place], [place], [address] Phone: [phone]   Fax:  [phone]  Name: Denise Wolfe MRN: [id number] Date of Birth: 1940-11-19

## 2019-10-22 ENCOUNTER — Other Ambulatory Visit: Payer: Self-pay

## 2019-10-22 ENCOUNTER — Ambulatory Visit: Payer: Medicare Other

## 2019-10-22 DIAGNOSIS — R2689 Other abnormalities of gait and mobility: Secondary | ICD-10-CM | POA: Diagnosis not present

## 2019-10-22 DIAGNOSIS — R2681 Unsteadiness on feet: Secondary | ICD-10-CM | POA: Diagnosis not present

## 2019-10-22 DIAGNOSIS — R42 Dizziness and giddiness: Secondary | ICD-10-CM | POA: Diagnosis not present

## 2019-10-22 NOTE — Therapy (Signed)
[facility] [address] [place], [place], [address] Phone: [phone]   Fax:  [phone]  Physical Therapy Treatment  Patient Details  Name: Denise Wolfe MRN: [id number] Date of Birth: 10/01/1940 Referring Provider (PT): Dr. Jerelene Redden   Encounter Date: 10/22/2019  PT End of Session - 10/22/19 1528    Visit Number  4    Number of Visits  9    Date for PT Re-Evaluation  12/08/19    Authorization Type  Medicare: progress note every 10th visit.    PT Start Time  1448    PT Stop Time  1527    PT Time Calculation (min)  39 min    Equipment Utilized During Treatment  Other (comment)   min guard to S prn   Activity Tolerance  Patient tolerated treatment well    Behavior During Therapy  WFL for tasks assessed/performed       Past Medical History:  Diagnosis Date  . Anemia    during childhood  . Arthritis    hands, shoulder  . Breast cancer ([place]) 03/2000   left side modified mastecomy  . Cancer [facility])    breast, age 69; left side  . Colon cancer ([place]) 02/11/2017   rectum-squamous cell carcinoma  . Depression   . Hypertension   . Personal history of chemotherapy   . Personal history of radiation therapy   . Squamous cell carcinoma of rectum ([place]) 02/14/2017    Past Surgical History:  Procedure Laterality Date  . ABDOMINAL HYSTERECTOMY     With bilateral oophorectomy. 2002.  Marland Kitchen BREAST BIOPSY Right 2004   benign  . BREAST SURGERY Left 2001   biopsy  . CATARACT EXTRACTION Bilateral 2017  . IR FLUORO GUIDE CV LINE RIGHT  03/11/2017  . IR FLUORO GUIDE CV LINE RIGHT  04/16/2017  . IR US GUIDE VASC ACCESS RIGHT  03/11/2017  . IR US GUIDE VASC ACCESS RIGHT  04/16/2017  . MASTECTOMY MODIFIED RADICAL Left 2001  . MENISCUS DEBRIDEMENT Right 2003  . TUMOR EXCISION Right    thigh area    There were no vitals filed for this visit.  Subjective Assessment - 10/22/19 1449    Subjective  Pt denied falls or changes since  last visit. Pt reported lightheadedness upon standing from chair to amb. back to PT gym.    Pertinent History  HTN, colon CA with bowel resection-not currently receiving chemo/radiation but has in the past, DDD, B cataract with extraction, Vitamin D insufficiency, HLD, depression, R shoulder joint pain, retrosternal pain, ADD, anemia during CA treatment, arthritis    Currently in Pain?  No/denies    Multiple Pain Sites  No         [facility] PT Assessment - 10/22/19 1450      Functional Gait  Assessment   Gait assessed   Yes    Gait Level Surface  Walks 20 ft in less than 7 sec but greater than 5.5 sec, uses assistive device, slower speed, mild gait deviations, or deviates 6-10 in outside of the 12 in walkway width.   6.68 sec.    Change in Gait Speed  Able to change speed, demonstrates mild gait deviations, deviates 6-10 in outside of the 12 in walkway width, or no gait deviations, unable to achieve a major change in velocity, or uses a change in velocity, or uses an assistive device.    Gait with Horizontal Head Turns  Performs head turns with moderate changes in gait  velocity, slows down, deviates 10-15 in outside 12 in walkway width but recovers, can continue to walk.    Gait with Vertical Head Turns  Performs task with slight change in gait velocity (eg, minor disruption to smooth gait path), deviates 6 - 10 in outside 12 in walkway width or uses assistive device    Gait and Pivot Turn  Pivot turns safely within 3 sec and stops quickly with no loss of balance.    Step Over Obstacle  Is able to step over 2 stacked shoe boxes taped together (9 in total height) without changing gait speed. No evidence of imbalance.    Gait with Narrow Base of Support  Ambulates 7-9 steps.   8 steps   Gait with Eyes Closed  Cannot walk 20 ft without assistance, severe gait deviations or imbalance, deviates greater than 15 in outside 12 in walkway width or will not attempt task.   deviated > 15" off track    Ambulating Backwards  Walks 20 ft, uses assistive device, slower speed, mild gait deviations, deviates 6-10 in outside 12 in walkway width.    Steps  Alternating feet, no rail.    Total Score  20    FGA comment:  20/30: moderate risk for falls                        Balance Exercises - 10/22/19 1519      Balance Exercises: Standing   Standing Eyes Opened  Narrow base of support (BOS);Head turns;Other reps (comment);Foam/compliant surface   10 reps head turns/nods   Standing Eyes Closed  Wide (BOA);Foam/compliant surface;Other reps (comment);Head turns   10 reps head turns/nods   Rockerboard  Anterior/posterior;Lateral;Head turns;EO;EC;10 seconds;30 seconds;10 reps;Other (comment)   no UE support, in corner with chair in front of pt       PT Education - 10/22/19 1528    Education Details  PT progressed pt's vestibular HEP and educated pt on FGA score (moderate risk for falls). PT encouraged pt to call PCP regarding orthostatics and that PT will focus on improving vesibular inputand dynamic balance.    Person(s) Educated  Patient    Methods  Explanation;Demonstration;Verbal cues;Handout    Comprehension  Returned demonstration;Verbalized understanding       PT Short Term Goals - 10/09/19 1050      PT SHORT TERM GOAL #1   Title  same as LTGs        PT Long Term Goals - 10/22/19 1531      PT LONG TERM GOAL #1   Title  Pt will be IND with HEP to improve dizziness and balance to improve safety. TARGET DATE FOR ALL LTGS:11/06/19    Time  4    Period  Weeks    Status  New      PT LONG TERM GOAL #2   Title  Pt will report dizziness </=2/10 at worst to improve QOL and improve functional mobility    Time  4    Period  Weeks    Status  New      PT LONG TERM GOAL #3   Title  Perform SOT and FGA and write goals as indicated.    Baseline  N/A for SOT as composite score is WNL's    Time  4    Period  Weeks    Status  Achieved      PT LONG TERM GOAL #4   Title   Continue to monitor  for BPPV and treat as indicated.    Time  4    Period  Weeks    Status  New      PT LONG TERM GOAL #5   Title  Pt will amb. 1000' over even/uneven terrain, IND, while performing head turns/nods without LOB to safely amb. in the community.    Time  4    Period  Weeks    Status  New      Additional Long Term Goals   Additional Long Term Goals  Yes      PT LONG TERM GOAL #6   Title  Pt will improve FGA score to >/=28/30 to decr. falls risk.    Baseline  20/30 on 10/22/19    Time  4    Period  Weeks    Status  New            Plan - 10/22/19 1529    Clinical Impression Statement  Pt's FGA indicated pt is at a medium risk for falls. Pt reported unsteadiness during testing but denied dizziness. Pt continues to experience incr. postural sway during actvities which require incr. vestibular input (head turns/nods and eyes closed). Pt would continue to benefit from skilled PT to improve safety during functional mobility.    Personal Factors and Comorbidities  Age;Comorbidity 3+;Past/Current Experience;Profession   pt stated she was reluctant to schedule appt, not knowing what PT could do   Comorbidities  HTN, colon CA with bowel resection-not currently receiving chemo/radiation but has in the past, DDD, B cataract with extraction, Vitamin D insufficiency, HLD, depression, R shoulder joint pain, retrosternal pain, ADD, anemia during CA treatment, arthritis    Examination-Activity Limitations  Bed Mobility;Locomotion Level;Reach Overhead;Transfers;Stand;Stairs    Examination-Participation Restrictions  Community Activity    Stability/Clinical Decision Making  Evolving/Moderate complexity    Rehab Potential  Good    PT Frequency  2x / week    PT Duration  4 weeks    PT Treatment/Interventions  ADLs/Self Care Home Management;Biofeedback;Canalith Repostioning;DME Instruction;Gait Scientist, forensic;Therapeutic activities;Therapeutic exercise;Balance  training;Neuromuscular re-education;Orthotic Fit/Training;Functional mobility training;Patient/family education;Manual techniques;Vestibular    PT Next Visit Plan  BP ONLY IN RUE AS PT WITH HX OF L BREAST CA WITH MASTECTOMY IN 2001. Continue high level balance to improve vestibular input.    PT Home Exercise Plan  balance on foam    Consulted and Agree with Plan of Care  Patient       Patient will benefit from skilled therapeutic intervention in order to improve the following deficits and impairments:  Abnormal gait, Dizziness, Decreased balance, Decreased knowledge of use of DME  Visit Diagnosis: Unsteadiness on feet  Dizziness and giddiness  Other abnormalities of gait and mobility     Problem List Patient Active Problem List   Diagnosis Date Noted  . Dizziness on standing 08/13/2019  . B12 deficiency 08/13/2019  . Personal history of radiation therapy 09/30/2018  . Personal history of chemotherapy 09/30/2018  . Anemia due to antineoplastic chemotherapy 05/14/2017  . Pneumonia of left lower lobe due to infectious organism 05/09/2017  . Difficulty sleeping 05/09/2017  . Burning sensation- scalp esp at night 05/09/2017  . Wheeze 05/09/2017  . Chemotherapy induced neutropenia ([place]) 04/14/2017  . Squamous cell carcinoma of anal canal ([place]) 04/11/2017  . Squamous cell carcinoma of rectum ([place]) 02/14/2017  . Colon cancer ([place]) 02/11/2017  . Attention deficit disorder 12/27/2016  . Multiple atypical nevi 12/27/2016  . Nonspecific chest pain 08/08/2016  . Mixed disorder as  reaction to stress 08/08/2016  . Vitamin D insufficiency 07/19/2016  . HLD (hyperlipidemia) 07/19/2016  . Retrosternal chest pain 07/19/2016  . Degenerative joint disease (DJD) of sternoclavicular joint, right 07/19/2016  . Elevated HDL:  >er 90 07/19/2016  . History of breast Cancer 05/10/2016  . Depression, recurrent ([place]) 05/10/2016  . Cataract 05/10/2016  . Pain in joint, shoulder region 05/10/2016  .  HTN (hypertension), benign 05/10/2016  . Breast cancer ([place]) 03/13/2000    Trygg Mantz L 10/22/2019, 3:32 PM   [place], PT,DPT 10/22/19 3:35 PM Phone: [phone] Fax: [facility] [address] [place], [place], [address] Phone: [phone]   Fax:  [phone]  Name: Denise Wolfe MRN: [id number] Date of Birth: Sep 20, 1940

## 2019-10-22 NOTE — Patient Instructions (Signed)
CORNER BALANCE EXERCISES:  Progress to standing on couch cushion, with chair in front of you in a corner for safety. Feet APART with EYES CLOSED, turn head side to side 10 times and up and down 10 times. Repeat 3 times.  Feet TOGETHER with EYES OPEN, turn head side to side 10 times and up and down 10 times. Repeat 3 times.

## 2019-10-27 ENCOUNTER — Ambulatory Visit: Payer: Medicare Other | Admitting: Physical Therapy

## 2019-10-27 ENCOUNTER — Other Ambulatory Visit: Payer: Self-pay

## 2019-10-27 ENCOUNTER — Encounter: Payer: Self-pay | Admitting: Physical Therapy

## 2019-10-27 DIAGNOSIS — R2681 Unsteadiness on feet: Secondary | ICD-10-CM

## 2019-10-27 DIAGNOSIS — R42 Dizziness and giddiness: Secondary | ICD-10-CM

## 2019-10-27 DIAGNOSIS — R2689 Other abnormalities of gait and mobility: Secondary | ICD-10-CM | POA: Diagnosis not present

## 2019-10-28 NOTE — Therapy (Signed)
[facility] [address] [place], [place], [address] Phone: [phone]   Fax:  [phone]  Physical Therapy Treatment  Patient Details  Name: Denise Wolfe MRN: [id number] Date of Birth: [birth year] Referring Provider (PT): Dr. Jerelene Redden   Encounter Date: 10/27/2019  PT End of Session - 10/28/19 1311    Visit Number  5    Number of Visits  9    Date for PT Re-Evaluation  12/08/19    Authorization Type  Medicare: progress note every 10th visit.    PT Start Time  1103    PT Stop Time  1146    PT Time Calculation (min)  43 min    Activity Tolerance  Patient tolerated treatment well    Behavior During Therapy  WFL for tasks assessed/performed       Past Medical History:  Diagnosis Date  . Anemia    during childhood  . Arthritis    hands, shoulder  . Breast cancer ([place]) 03/2000   left side modified mastecomy  . Cancer [facility])    breast, age 66; left side  . Colon cancer ([place]) 02/11/2017   rectum-squamous cell carcinoma  . Depression   . Hypertension   . Personal history of chemotherapy   . Personal history of radiation therapy   . Squamous cell carcinoma of rectum ([place]) 02/14/2017    Past Surgical History:  Procedure Laterality Date  . ABDOMINAL HYSTERECTOMY     With bilateral oophorectomy. 2002.  Marland Kitchen BREAST BIOPSY Right 2004   benign  . BREAST SURGERY Left 2001   biopsy  . CATARACT EXTRACTION Bilateral 2017  . IR FLUORO GUIDE CV LINE RIGHT  03/11/2017  . IR FLUORO GUIDE CV LINE RIGHT  04/16/2017  . IR US GUIDE VASC ACCESS RIGHT  03/11/2017  . IR US GUIDE VASC ACCESS RIGHT  04/16/2017  . MASTECTOMY MODIFIED RADICAL Left 2001  . MENISCUS DEBRIDEMENT Right 2003  . TUMOR EXCISION Right    thigh area    There were no vitals filed for this visit.  Subjective Assessment - 10/27/19 1110    Subjective  No major changes in light-headedness or dizziness; pt states she does not feel she has a big problem with balance     Pertinent History  HTN, colon CA with bowel resection-not currently receiving chemo/radiation but has in the past, DDD, B cataract with extraction, Vitamin D insufficiency, HLD, depression, R shoulder joint pain, retrosternal pain, ADD, anemia during CA treatment, arthritis    Currently in Pain?  No/denies                       [facility] PT [facility]/Exercise - 10/28/19 0001      High Level Balance   High Level Balance Activities  Backward walking;Turns;Sudden stops;Head turns          Balance Exercises - 10/28/19 1303      Balance Exercises: Standing   Standing Eyes Closed  Wide (BOA);Head turns;Foam/compliant surface;5 reps    Rockerboard  Anterior/posterior;Lateral;Head turns;EO;EC;10 seconds;30 seconds;10 reps;Other (comment)   no UE support, in corner with chair in front of pt   Turning  Both;5 reps    Other Standing Exercises  Marching with EO with head turns; marching with EC without head turns with UE support prn    Other Standing Exercises Comments  pt performed amb. making circles with ball - clockwise, counterclockwise, "V" and "+"  patterns  PT Short Term Goals - 10/09/19 1050      PT SHORT TERM GOAL #1   Title  same as LTGs        PT Long Term Goals - 10/28/19 1314      PT LONG TERM GOAL #1   Title  Pt will be IND with HEP to improve dizziness and balance to improve safety. TARGET DATE FOR ALL LTGS:11/06/19    Time  4    Period  Weeks    Status  New      PT LONG TERM GOAL #2   Title  Pt will report dizziness </=2/10 at worst to improve QOL and improve functional mobility    Time  4    Period  Weeks    Status  New      PT LONG TERM GOAL #3   Title  Perform SOT and FGA and write goals as indicated.    Baseline  N/A for SOT as composite score is WNL's    Time  4    Period  Weeks    Status  Achieved      PT LONG TERM GOAL #4   Title  Continue to monitor for BPPV and treat as indicated.    Time  4    Period  Weeks     Status  New      PT LONG TERM GOAL #5   Title  Pt will amb. 1000' over even/uneven terrain, IND, while performing head turns/nods without LOB to safely amb. in the community.    Time  4    Period  Weeks    Status  New      PT LONG TERM GOAL #6   Title  Pt will improve FGA score to >/=28/30 to decr. falls risk.    Baseline  20/30 on 10/22/19    Time  4    Period  Weeks    Status  New            Plan - 10/27/19 1113    Clinical Impression Statement  Pt reported no significant dizziness with any activities during today's session;  pt demonstrates mild unsteadiness with high level balance/vestibular activities but no major LOB occurred.    Personal Factors and Comorbidities  Age;Comorbidity 3+;Past/Current Experience;Profession   pt stated she was reluctant to schedule appt, not knowing what PT could do   Comorbidities  HTN, colon CA with bowel resection-not currently receiving chemo/radiation but has in the past, DDD, B cataract with extraction, Vitamin D insufficiency, HLD, depression, R shoulder joint pain, retrosternal pain, ADD, anemia during CA treatment, arthritis    Examination-Activity Limitations  Bed Mobility;Locomotion Level;Reach Overhead;Transfers;Stand;Stairs    Examination-Participation Restrictions  Community Activity    Stability/Clinical Decision Making  Evolving/Moderate complexity    Rehab Potential  Good    PT Frequency  2x / week    PT Duration  4 weeks    PT Treatment/Interventions  ADLs/Self Care Home Management;Biofeedback;Canalith Repostioning;DME Instruction;Gait Scientist, forensic;Therapeutic activities;Therapeutic exercise;Balance training;Neuromuscular re-education;Orthotic Fit/Training;Functional mobility training;Patient/family education;Manual techniques;Vestibular    PT Next Visit Plan  BP ONLY IN RUE AS PT WITH HX OF L BREAST CA WITH MASTECTOMY IN 2001. Continue high level balance to improve vestibular input.    PT Home Exercise Plan  balance on  foam    Consulted and Agree with Plan of Care  Patient       Patient will benefit from skilled therapeutic intervention in order to improve the following deficits and  impairments:  Abnormal gait, Dizziness, Decreased balance, Decreased knowledge of use of DME  Visit Diagnosis: Unsteadiness on feet  Dizziness and giddiness     Problem List Patient Active Problem List   Diagnosis Date Noted  . Dizziness on standing 08/13/2019  . B12 deficiency 08/13/2019  . Personal history of radiation therapy 09/30/2018  . Personal history of chemotherapy 09/30/2018  . Anemia due to antineoplastic chemotherapy 05/14/2017  . Pneumonia of left lower lobe due to infectious organism 05/09/2017  . Difficulty sleeping 05/09/2017  . Burning sensation- scalp esp at night 05/09/2017  . Wheeze 05/09/2017  . Chemotherapy induced neutropenia ([place]) 04/14/2017  . Squamous cell carcinoma of anal canal ([place]) 04/11/2017  . Squamous cell carcinoma of rectum ([place]) 02/14/2017  . Colon cancer ([place]) 02/11/2017  . Attention deficit disorder 12/27/2016  . Multiple atypical nevi 12/27/2016  . Nonspecific chest pain 08/08/2016  . 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 Vitamin D insufficiency 07/19/2016  . HLD (hyperlipidemia) 07/19/2016  . Retrosternal chest pain 07/19/2016  . Degenerative joint disease (DJD) of sternoclavicular joint, right 07/19/2016  . Elevated HDL:  >er 90 07/19/2016  . History of breast Cancer 05/10/2016  . Depression, recurrent ([place]) 05/10/2016  . Cataract 05/10/2016  . Pain in joint, shoulder region 05/10/2016  . HTN (hypertension), benign 05/10/2016  . Breast cancer ([place]) 03/13/2000    Lynzee Lindquist, [place], PT 10/28/2019, 1:15 PM  [facility] [address] [place], [place], [address] Phone: [phone]   Fax:  [phone]  Name: Denise Wolfe MRN: [id number] Date of Birth: Sep 21, 1940

## 2019-10-29 ENCOUNTER — Ambulatory Visit: Payer: Medicare Other | Admitting: Physical Therapy

## 2019-11-03 ENCOUNTER — Other Ambulatory Visit: Payer: Self-pay

## 2019-11-03 ENCOUNTER — Ambulatory Visit: Payer: Medicare Other | Admitting: Physical Therapy

## 2019-11-03 VITALS — BP 139/84 | HR 84

## 2019-11-03 DIAGNOSIS — R42 Dizziness and giddiness: Secondary | ICD-10-CM | POA: Diagnosis not present

## 2019-11-03 DIAGNOSIS — R2681 Unsteadiness on feet: Secondary | ICD-10-CM | POA: Diagnosis not present

## 2019-11-03 DIAGNOSIS — R2689 Other abnormalities of gait and mobility: Secondary | ICD-10-CM | POA: Diagnosis not present

## 2019-11-04 NOTE — Therapy (Signed)
[facility] [address] [place], [place], [address] Phone: [phone]   Fax:  [phone]  Physical Therapy Treatment & Discharge Summary  Patient Details  Name: Denise Wolfe MRN: [id number] Date of Birth: [birth year] Referring Provider (PT): Dr. Jerelene Redden   Encounter Date: 11/03/2019  PT End of Session - 11/04/19 1823    Visit Number  6    Number of Visits  9    Date for PT Re-Evaluation  12/08/19    Authorization Type  Medicare: progress note every 10th visit.    PT Start Time  1102    PT Stop Time  1145    PT Time Calculation (min)  43 min    Activity Tolerance  Patient tolerated treatment well    Behavior During Therapy  WFL for tasks assessed/performed       Past Medical History:  Diagnosis Date  . Anemia    during childhood  . Arthritis    hands, shoulder  . Breast cancer ([place]) 03/2000   left side modified mastecomy  . Cancer [facility])    breast, age 50; left side  . Colon cancer ([place]) 02/11/2017   rectum-squamous cell carcinoma  . Depression   . Hypertension   . Personal history of chemotherapy   . Personal history of radiation therapy   . Squamous cell carcinoma of rectum ([place]) 02/14/2017    Past Surgical History:  Procedure Laterality Date  . ABDOMINAL HYSTERECTOMY     With bilateral oophorectomy. 2002.  Marland Kitchen BREAST BIOPSY Right 2004   benign  . BREAST SURGERY Left 2001   biopsy  . CATARACT EXTRACTION Bilateral 2017  . IR FLUORO GUIDE CV LINE RIGHT  03/11/2017  . IR FLUORO GUIDE CV LINE RIGHT  04/16/2017  . IR US GUIDE VASC ACCESS RIGHT  03/11/2017  . IR US GUIDE VASC ACCESS RIGHT  04/16/2017  . MASTECTOMY MODIFIED RADICAL Left 2001  . MENISCUS DEBRIDEMENT Right 2003  . TUMOR EXCISION Right    thigh area    Vitals:   11/03/19 1137  BP: 139/84  Pulse: 84    Subjective Assessment - 11/04/19 1822    Subjective  Pt reports only occasional moments of dizziness which last for only a few seconds -  pt states it is not a big problem for her at this time - feels ready for discharge    Pertinent History  HTN, colon CA with bowel resection-not currently receiving chemo/radiation but has in the past, DDD, B cataract with extraction, Vitamin D insufficiency, HLD, depression, R shoulder joint pain, retrosternal pain, ADD, anemia during CA treatment, arthritis    Patient Stated Goals  To not get dizzy.    Currently in Pain?  No/denies         [facility] PT Assessment - 11/04/19 0001      Functional Gait  Assessment   Gait assessed   Yes    Gait Level Surface  Walks 20 ft in less than 5.5 sec, no assistive devices, good speed, no evidence for imbalance, normal gait pattern, deviates no more than 6 in outside of the 12 in walkway width.    Change in Gait Speed  Able to smoothly change walking speed without loss of balance or gait deviation. Deviate no more than 6 in outside of the 12 in walkway width.    Gait with Horizontal Head Turns  Performs head turns smoothly with no change in gait. Deviates no more than 6 in outside 12 in walkway  width    Gait with Vertical Head Turns  Performs head turns with no change in gait. Deviates no more than 6 in outside 12 in walkway width.    Gait and Pivot Turn  Pivot turns safely within 3 sec and stops quickly with no loss of balance.    Step Over Obstacle  Is able to step over 2 stacked shoe boxes taped together (9 in total height) without changing gait speed. No evidence of imbalance.    Gait with Narrow Base of Support  Is able to ambulate for 10 steps heel to toe with no staggering.    Gait with Eyes Closed  Walks 20 ft, uses assistive device, slower speed, mild gait deviations, deviates 6-10 in outside 12 in walkway width. Ambulates 20 ft in less than 9 sec but greater than 7 sec.    Ambulating Backwards  Walks 20 ft, no assistive devices, good speed, no evidence for imbalance, normal gait    Steps  Alternating feet, no rail.    Total Score  29         Reviewed HEP and LTG's and progress                     PT Education - 11/04/19 1823    Education Details  reviewed LTG's and progress and HEP       PT Short Term Goals - 10/09/19 1050      PT SHORT TERM GOAL #1   Title  same as LTGs        PT Long Term Goals - 11/03/19 1110      PT LONG TERM GOAL #1   Title  Pt will be IND with HEP to improve dizziness and balance to improve safety. TARGET DATE FOR ALL LTGS:11/06/19    Time  4    Period  Weeks    Status  Achieved      PT LONG TERM GOAL #2   Title  Pt will report dizziness </=2/10 at worst to improve QOL and improve functional mobility    Baseline  Pt reports dizziness usually occurs daily but lasts only seconds - rates it intensity 5/10 when it does occur    Time  4    Period  Weeks    Status  Partially Met      PT LONG TERM GOAL #3   Title  Perform SOT and FGA and write goals as indicated.    Baseline  N/A for SOT as composite score is WNL's - goal deferred    Time  4    Period  Weeks    Status  Deferred      PT LONG TERM GOAL #4   Title  Continue to monitor for BPPV and treat as indicated.    Baseline  no signs of BPPV at this time    Time  4    Period  Weeks    Status  Deferred      PT LONG TERM GOAL #5   Title  Pt will amb. 1000' over even/uneven terrain, IND, while performing head turns/nods without LOB to safely amb. in the community.    Baseline  goal not assessed outside due to weather - pt able to amb. 44' indoors with head turns without LOB    Time  4    Period  Weeks    Status  Unable to assess      PT LONG TERM GOAL #6   Title  Pt  will improve FGA score to >/=28/30 to decr. falls risk.    Baseline  20/30 on 10/22/19    Time  4    Period  Weeks    Status  Achieved              Patient will benefit from skilled therapeutic intervention in order to improve the following deficits and impairments:     Visit Diagnosis: Unsteadiness on feet  Dizziness and  giddiness     Problem List Patient Active Problem List   Diagnosis Date Noted  . Dizziness on standing 08/13/2019  . B12 deficiency 08/13/2019  . Personal history of radiation therapy 09/30/2018  . Personal history of chemotherapy 09/30/2018  . Anemia due to antineoplastic chemotherapy 05/14/2017  . Pneumonia of left lower lobe due to infectious organism 05/09/2017  . Difficulty sleeping 05/09/2017  . Burning sensation- scalp esp at night 05/09/2017  . Wheeze 05/09/2017  . Chemotherapy induced neutropenia ([place]) 04/14/2017  . Squamous cell carcinoma of anal canal ([place]) 04/11/2017  . Squamous cell carcinoma of rectum ([place]) 02/14/2017  . Colon cancer ([place]) 02/11/2017  . Attention deficit disorder 12/27/2016  . Multiple atypical nevi 12/27/2016  . Nonspecific chest pain 08/08/2016  . 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 Vitamin D insufficiency 07/19/2016  . HLD (hyperlipidemia) 07/19/2016  . Retrosternal chest pain 07/19/2016  . Degenerative joint disease (DJD) of sternoclavicular joint, right 07/19/2016  . Elevated HDL:  >er 90 07/19/2016  . History of breast Cancer 05/10/2016  . Depression, recurrent ([place]) 05/10/2016  . Cataract 05/10/2016  . Pain in joint, shoulder region 05/10/2016  . HTN (hypertension), benign 05/10/2016  . Breast cancer ([place]) 03/13/2000    PHYSICAL THERAPY DISCHARGE SUMMARY  Visits from Start of Care: 6  Current functional level related to goals / functional outcomes: See above for progress towards goals   Remaining deficits: PT reports occasional moments of spontaneous dizziness, but states it only lasts for a few seconds - etiology unknown   Education / Equipment: Pt has been instructed in HEP for balance/vestibular exercises.   Plan: Patient agrees to discharge.  Patient goals were met. Patient is being discharged due to meeting the stated rehab goals.  ?????      Alda Lea, PT 11/04/2019, 6:31 PM  [facility] [address] [place], [place], [address] Phone: [phone]   Fax:  [phone]  Name: Nusrat Encarnacion MRN: [id number] Date of Birth: 10/19/1940

## 2019-11-05 ENCOUNTER — Encounter: Payer: Medicare Other | Admitting: Physical Therapy

## 2019-11-12 ENCOUNTER — Encounter: Payer: Medicare Other | Admitting: Physical Therapy

## 2019-11-19 ENCOUNTER — Telehealth: Payer: Self-pay | Admitting: Family Medicine

## 2019-11-19 NOTE — Telephone Encounter (Signed)
Patient know OV/ Telehealth required for Rx refill & has scheduled an Appt

## 2019-11-20 DIAGNOSIS — H26493 Other secondary cataract, bilateral: Secondary | ICD-10-CM | POA: Diagnosis not present

## 2019-11-20 DIAGNOSIS — H52223 Regular astigmatism, bilateral: Secondary | ICD-10-CM | POA: Diagnosis not present

## 2019-11-20 DIAGNOSIS — Z9841 Cataract extraction status, right eye: Secondary | ICD-10-CM | POA: Diagnosis not present

## 2019-11-20 DIAGNOSIS — Z9842 Cataract extraction status, left eye: Secondary | ICD-10-CM | POA: Diagnosis not present

## 2019-11-24 ENCOUNTER — Ambulatory Visit: Payer: Medicare Other | Admitting: Physician Assistant

## 2019-11-25 ENCOUNTER — Ambulatory Visit: Payer: Medicare Other | Admitting: Physician Assistant

## 2019-11-26 ENCOUNTER — Other Ambulatory Visit: Payer: Self-pay

## 2019-11-26 ENCOUNTER — Ambulatory Visit (INDEPENDENT_AMBULATORY_CARE_PROVIDER_SITE_OTHER): Payer: Medicare Other | Admitting: Family Medicine

## 2019-11-26 ENCOUNTER — Encounter: Payer: Self-pay | Admitting: Family Medicine

## 2019-11-26 VITALS — BP 128/75 | HR 83 | Temp 97.9°F | Ht 62.0 in | Wt 133.0 lb

## 2019-11-26 DIAGNOSIS — C20 Malignant neoplasm of rectum: Secondary | ICD-10-CM

## 2019-11-26 DIAGNOSIS — Z9221 Personal history of antineoplastic chemotherapy: Secondary | ICD-10-CM | POA: Diagnosis not present

## 2019-11-26 DIAGNOSIS — F988 Other specified behavioral and emotional disorders with onset usually occurring in childhood and adolescence: Secondary | ICD-10-CM

## 2019-11-26 DIAGNOSIS — R009 Unspecified abnormalities of heart beat: Secondary | ICD-10-CM | POA: Diagnosis not present

## 2019-11-26 DIAGNOSIS — C50919 Malignant neoplasm of unspecified site of unspecified female breast: Secondary | ICD-10-CM | POA: Diagnosis not present

## 2019-11-26 DIAGNOSIS — H8110 Benign paroxysmal vertigo, unspecified ear: Secondary | ICD-10-CM | POA: Diagnosis not present

## 2019-11-26 MED ORDER — AMPHETAMINE-DEXTROAMPHETAMINE 10 MG PO TABS: 10.0000 mg | ORAL_TABLET | ORAL | 0 refills | Status: DC

## 2019-11-26 NOTE — Progress Notes (Signed)
Telehealth office visit note for Denise Wolfe, D.O- at Primary Care at [facility]   I connected with current patient today and verified that I am speaking with the correct person   . Location of the patient: Home . Location of the provider: Office - This visit type was conducted due to national recommendations for restrictions regarding the COVID-19 Pandemic (e.g. social distancing) in an effort to limit this patient's exposure and mitigate transmission in our community.    - No physical exam could be performed with this format, beyond that communicated to [place] by the patient/ family members as noted.   - Additionally my office staff/ schedulers were to discuss with the patient that there may be a monetary charge related to this service, depending on their medical insurance.  My understanding is that patient understood and consented to proceed.     _________________________________________________________________________________   History of Present Illness:  I, Denise Wolfe, am serving as scribe for [employer].  - ADD Continues with 10 mg of Adderall once per day, tolerating well.  - History of Cancer Treatment Notes she hasn't seen Dr. Burr Medico of Oncology in a long time.  Patient took several medications for chemotherapy in the past, and also engaged in radiation therapy.  Notes it took three years for her WBC to "register above the baseline."  She asked her oncologist about concerns regarding her low white blood cell count in the past.  - Dizziness, Vestibular Rehab, Orthostatic Hypotension Patient was referred to vestibular rehab for treatment of dizziness.  She feels her dizziness has not hugely improved since vestibular rehab.  For her sinuses, she's been using Flonase, but Astelin was too expensive.  Says that the nasal spray "helps a little bit with the congestion that I have, usually only in the morning."  She went for quite a few sessions and notes "they  discharged me."  Says "everything looked pretty much okay."  The only thing that they told her to discuss with her PCP was her blood pressure.  States "[the specialist] did a test that was lying down for 5 minutes, then immediately seated and standing, and she said that it was not normal."  Patient confirms that the finding was orthostatic hypotension.  Patient says that her understanding of the situation was that "the differential between the change over that series of blood pressure was greater than expected."  Patient reports that after she went supine for five minutes, BP of 163/97 - 148/87 with pulse of 85, her sitting BP went to 132/79 with a pulse of 85, and standing for five minutes caused a BP of 128/79 and pulse of 92.  After 3 minutes of standing, she had a BP of 126/53 with heart rate of 56.  The patient does not think her blood pressure runs too low at home.  States she is not very good about checking her BP on a regular basis.  Today she checked it at 128/75 with a pulse of 83.  Sometimes it runs 141-142, over 70, she doesn't remember exactly, and doesn't pay much attention to her heart rate / pulse.  Thinks her lowest BP measurement was 121.  She doesn't remember the diastolic from that.  "I actually took it because I was feeling a little bit dizzy."  To help alleviate her dizziness, she was told to sit and wiggle her feet a bit when transitioning from sitting to standing, and notes "that seems to make a bit of a  difference."  - Patient reports good compliance with medication and/or lifestyle modification  - Her denies acute concerns or problems related to treatment plan  - She denies new onset of: chest pain, exercise intolerance, shortness of breath, visual changes, headache, lower extremity swelling or claudication.   Last 3 blood pressure readings in our office are as follows: BP Readings from Last 3 Encounters:  11/26/19 128/75  11/03/19 139/84  10/20/19 137/84   Filed  Weights   11/26/19 1308  Weight: 133 lb (60.3 kg)       GAD 7 : Generalized Anxiety Score 11/26/2019 09/09/2019 01/28/2019 12/15/2018  Nervous, Anxious, on Edge 0 0 0 0  Control/stop worrying 0 0 0 0  Worry too much - different things 0 0 0 0  Trouble relaxing 1 1 1 1   Restless 1 3 0 3  Easily annoyed or irritable 0 0 0 0  Afraid - awful might happen 0 0 0 0  Total GAD 7 Score 2 4 1 4   Anxiety Difficulty Not difficult at all Somewhat difficult Not difficult at all Somewhat difficult    Depression screen [facility] 2/9 11/26/2019 09/09/2019 08/13/2019 01/28/2019 12/15/2018  Decreased Interest 0 1 0 0 0  Down, Depressed, Hopeless 0 1 0 0 0  PHQ - 2 Score 0 2 0 0 0  Altered sleeping 3 3 1 1  0  Tired, decreased energy 1 1 1  0 0  Change in appetite 0 0 0 0 0  Feeling bad or failure about yourself  0 0 0 0 0  Trouble concentrating 3 3 1 3 3   Moving slowly or fidgety/restless 0 0 0 0 0  Suicidal thoughts 0 0 0 0 0  PHQ-9 Score 7 9 3 4 3   Difficult doing work/chores Somewhat difficult Somewhat difficult Not difficult at all Somewhat difficult Not difficult at all  Some recent data might be hidden      Impression and Recommendations:     1. Benign paroxysmal positional vertigo, unspecified laterality   2. Attention deficit disorder, unspecified hyperactivity presence   3. Squamous cell carcinoma of rectum (HCC)   4. Malignant neoplasm of female breast, unspecified estrogen receptor status, unspecified laterality, unspecified site of breast ([place])   5. Hx antineoplastic chemo   6. Abnormal heart rate      - Last OV 09/09/2019.  - MRI of brain obtained in 1 of 2021.  Benign Paroxysmal Positional Vertigo - Patient recently underwent vestibular rehab. - Per patient, minimal resolution of symptoms since completion of rehab.  - Will continue to monitor and refer to additional specialists.   Abnormal Heart Rate, Dizziness, Mild Orthostatic Hypotension & Unexplained Drop in Heart Rate from 90  to 50 from sitting to standing; hx antineoplastic chemo - Told patient of critical importance of monitoring blood pressure at home. - Encouraged patient to keep a BP and pulse log at home.   - Given patient's ongoing dizziness, discussed referral to cardiology today for further evaluation. - Discussed that history of chemotherapy can affect the heart. - Ambulatory referral provided today.  See orders.  - Reviewed need for further imaging and assessment to examine patient's heart rate, blood pressure, possible cardiac etiology of symptoms, and determine further follow-up.  - Advised patient to hydrate adequately, drinking more water. - Discussed critical importance of hydration as relates to blood pressure.  - Will continue to monitor alongside cardiologist.   History of Cancer - squamous cell carcinoma of rectum, malignant neoplasm of female breast -  Stable at this time with history of treatment.  - If patient continues to experience concerns about her history of treatment or white blood cell count, patient knows she may request referral to specailists at any time as desired.  - Will continue to monitor.   ADD - Stable at this time. - Patient continues management on 10 mg daily. - Tolerating well without S-E.  - Continue management as established.  See med list.  - Reviewed controlled substance contract with patient today.  - Refill provided.  See orders.    - As part of my medical decision making, I reviewed the following data within the [place] History obtained from pt /family, CMA notes reviewed and incorporated if applicable, Labs reviewed, Radiograph/ tests reviewed if applicable and OV notes from prior OV's with me, as well as other specialists she/he has seen since seeing me last, were all reviewed and used in my medical decision making process today.    - Additionally, when appropriate, discussion had with patient regarding our treatment plan, and  their biases/concerns about that plan were used in my medical decision making today.    - The patient agreed with the plan and demonstrated an understanding of the instructions.   No barriers to understanding were identified.     - The patient was advised to call back or seek an in-person evaluation if the symptoms worsen or if the condition fails to improve as anticipated.   Return in 2 months (on 02/09/2020) for in-person OV 4 ADD,(dizziness/orthostatic hypotension changes-cardiology referral placed today).    Orders Placed This Encounter  Procedures  . Ambulatory referral to Cardiology    Meds ordered this encounter  Medications  . amphetamine-dextroamphetamine (ADDERALL) 10 MG tablet    Sig: Take 1 tablet (10 mg total) by mouth every morning.    Dispense:  90 tablet    Refill:  0    Medications Discontinued During This Encounter  Medication Reason  . amphetamine-dextroamphetamine (ADDERALL) 10 MG tablet Reorder       Time spent on visit including pre-visit chart review and post-visit care was 31 minutes.    Note:  This note was prepared with assistance of Dragon voice recognition software. Occasional wrong-word or sound-a-like substitutions may have occurred due to the inherent limitations of voice recognition software.  The [place] was signed into law in 2016 which includes the topic of electronic health records.  This provides immediate access to information in MyChart.  This includes consultation notes, operative notes, office notes, lab results and pathology reports.  If you have any questions about what you read please let us know at your next visit or call us at the office.  We are right here with you.  This document serves as a record of services personally performed by Denise Dance, DO. It was created on her behalf by Denise Wolfe, a trained medical scribe. The creation of this record is based on the scribe's personal observations and the  provider's statements to them.    The above documentation from Denise Wolfe, medical scribe, has been reviewed by Marjory Sneddon, D.O.    __________________________________________________________________________________     Patient Care Team    Relationship Specialty Notifications Start End  Denise Dance, DO PCP - General Family Medicine  05/10/16   Silverio Decamp, MD Consulting Physician Family Medicine  07/25/16    Comment: OA - shoulder injeciton  Truitt Merle, MD Consulting Physician Hematology  07/23/17   Marcello Moores,  Elmo Putt, MD Consulting Physician General Surgery  07/23/17      -Vitals obtained; medications/ allergies reconciled;  personal medical, social, Sx etc.histories were updated by CMA, reviewed by me and are reflected in chart   Patient Active Problem List   Diagnosis Date Noted  . Squamous cell carcinoma of rectum ([place]) 02/14/2017  . HLD (hyperlipidemia) 07/19/2016  . Elevated HDL:  >er 90 07/19/2016  . History of breast Cancer 05/10/2016  . HTN (hypertension), benign 05/10/2016  . Breast cancer ([place]) 03/13/2000  . 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 Depression, recurrent ([place]) 05/10/2016  . Chemotherapy induced neutropenia ([place]) 04/14/2017  . Vitamin D insufficiency 07/19/2016  . Degenerative joint disease (DJD) of sternoclavicular joint, right 07/19/2016  . Pain in joint, shoulder region 05/10/2016  . Dizziness on standing 08/13/2019  . B12 deficiency 08/13/2019  . Personal history of radiation therapy 09/30/2018  . Hx antineoplastic chemo 09/30/2018  . Anemia due to antineoplastic chemotherapy 05/14/2017  . Pneumonia of left lower lobe due to infectious organism 05/09/2017  . Difficulty sleeping 05/09/2017  . Burning sensation- scalp esp at night 05/09/2017  . Wheeze 05/09/2017  . Squamous cell carcinoma of anal canal ([place]) 04/11/2017  . Colon cancer ([place]) 02/11/2017  . Attention deficit disorder 12/27/2016  . Multiple  atypical nevi 12/27/2016  . Nonspecific chest pain 08/08/2016  . Retrosternal chest pain 07/19/2016  . Cataract 05/10/2016     Current Meds  Medication Sig  . amphetamine-dextroamphetamine (ADDERALL) 10 MG tablet Take 1 tablet (10 mg total) by mouth every morning.  Marland Kitchen atorvastatin (LIPITOR) 80 MG tablet TAKE ONE-HALF TABLET BY  MOUTH DAILY AT 6 PM.  . cholecalciferol (VITAMIN D) 400 units TABS tablet Take 400 Units by mouth daily. Patient is not sure of the units.  . fluticasone (FLONASE) 50 MCG/ACT nasal spray 1 spray each nostril following sinus rinses twice daily  . losartan-hydrochlorothiazide (HYZAAR) 50-12.5 MG tablet Take 1 tablet by mouth daily.  . sertraline (ZOLOFT) 50 MG tablet TAKE 1 AND 1/2 TABLETS BY  MOUTH DAILY  . vitamin B-12 (CYANOCOBALAMIN) 500 MCG tablet Take 1 tablet (500 mcg total) by mouth daily.  . [DISCONTINUED] amphetamine-dextroamphetamine (ADDERALL) 10 MG tablet Take 1 tablet (10 mg total) by mouth every morning.   Current Facility-Administered Medications for the 11/26/19 encounter (Office Visit) with Denise Dance, DO  Medication  . 0.9 %  sodium chloride infusion     Allergies:  Allergies  Allergen Reactions  . Ibuprofen Rash     ROS:  See above HPI for pertinent positives and negatives   Objective:   Blood pressure 128/75, pulse 83, temperature 97.9 F (36.6 C), temperature source Oral, height 5\' 2"  (1.575 m), weight 133 lb (60.3 kg).  (if some vitals are omitted, this means that patient was UNABLE to obtain them even though they were asked to get them prior to OV today.  They were asked to call us at their earliest convenience with these once obtained. ) General: A & O * 3; sounds in no acute distress; in usual state of health.  Skin: Pt confirms warm and dry extremities and pink fingertips HEENT: Pt confirms lips non-cyanotic Chest: Patient confirms normal chest excursion and movement Respiratory: speaking in full sentences, no  conversational dyspnea; patient confirms no use of accessory muscles Psych: insight appears good, mood- appears full

## 2019-12-11 ENCOUNTER — Encounter: Payer: Self-pay | Admitting: Internal Medicine

## 2019-12-11 ENCOUNTER — Other Ambulatory Visit: Payer: Self-pay

## 2019-12-11 ENCOUNTER — Ambulatory Visit (INDEPENDENT_AMBULATORY_CARE_PROVIDER_SITE_OTHER): Payer: Medicare Other | Admitting: Internal Medicine

## 2019-12-11 VITALS — BP 120/80 | HR 88 | Ht 62.0 in | Wt 135.0 lb

## 2019-12-11 DIAGNOSIS — I10 Essential (primary) hypertension: Secondary | ICD-10-CM

## 2019-12-11 DIAGNOSIS — R0600 Dyspnea, unspecified: Secondary | ICD-10-CM

## 2019-12-11 DIAGNOSIS — R0609 Other forms of dyspnea: Secondary | ICD-10-CM

## 2019-12-11 DIAGNOSIS — E782 Mixed hyperlipidemia: Secondary | ICD-10-CM

## 2019-12-11 DIAGNOSIS — C801 Malignant (primary) neoplasm, unspecified: Secondary | ICD-10-CM

## 2019-12-11 DIAGNOSIS — D701 Agranulocytosis secondary to cancer chemotherapy: Secondary | ICD-10-CM

## 2019-12-11 DIAGNOSIS — R42 Dizziness and giddiness: Secondary | ICD-10-CM

## 2019-12-11 DIAGNOSIS — D6481 Anemia due to antineoplastic chemotherapy: Secondary | ICD-10-CM

## 2019-12-11 NOTE — Progress Notes (Signed)
Cardiology Office Note:    Date:  12/11/2019   ID:  Denise Wolfe, DOB Jun 02, 1941, MRN [id number]  PCP:  Denise Reid, PA-C  Cardiologist:  No primary care provider on file.  Electrophysiologist:  None   Referring MD: Denise Dance, DO   Chief Complaint: dizziness, orthostatic hypotension, history of chemotherapy   History of Present Illness:    Denise Wolfe is a 79 y.o. female with a history of breast cancer with chemotherapy and radiation, colon cancer, depression, hypertension. She presents for evaluation of dizziness.  She notes momentary dizzy spells over last year, where the "floor is jelly". It has not improved with vestibular rehab. She attended physical therapy where they performed orthostatic vital signs and these were positive.   She also notes DOE, while cleaning her house, when using the vacuum cleaner. This has been progressive over 1 year, however she also notes being able to work in her yard with no issues.   She has received chemotherapy in 2001 and 2018.  She denies chest pain, pnd, orthopnea, syncope, leg swelling.  Past Medical History:  Diagnosis Date  . Anemia    during childhood  . Arthritis    hands, shoulder  . Breast cancer ([place]) 03/2000   left side modified mastecomy  . Cancer [facility])    breast, age 41; left side  . Colon cancer ([place]) 02/11/2017   rectum-squamous cell carcinoma  . Depression   . Hypertension   . Personal history of chemotherapy   . Personal history of radiation therapy   . Squamous cell carcinoma of rectum ([place]) 02/14/2017    Past Surgical History:  Procedure Laterality Date  . ABDOMINAL HYSTERECTOMY     With bilateral oophorectomy. 2002.  Marland Kitchen BREAST BIOPSY Right 2004   benign  . BREAST SURGERY Left 2001   biopsy  . CATARACT EXTRACTION Bilateral 2017  . IR FLUORO GUIDE CV LINE RIGHT  03/11/2017  . IR FLUORO GUIDE CV LINE RIGHT  04/16/2017  . IR US GUIDE VASC ACCESS RIGHT  03/11/2017  . IR US GUIDE VASC ACCESS RIGHT   04/16/2017  . MASTECTOMY MODIFIED RADICAL Left 2001  . MENISCUS DEBRIDEMENT Right 2003  . TUMOR EXCISION Right    thigh area    Current Medications: Current Meds  Medication Sig  . amphetamine-dextroamphetamine (ADDERALL) 10 MG tablet Take 1 tablet (10 mg total) by mouth every morning.  Marland Kitchen atorvastatin (LIPITOR) 80 MG tablet TAKE ONE-HALF TABLET BY  MOUTH DAILY AT 6 PM.  . azelastine (ASTELIN) 0.1 % nasal spray Place 1 spray into both nostrils 2 (two) times daily. 1 spray each nostril twice daily after sinus rinses  . cholecalciferol (VITAMIN D) 400 units TABS tablet Take 400 Units by mouth daily. Patient is not sure of the units.  . fluticasone (FLONASE) 50 MCG/ACT nasal spray 1 spray each nostril following sinus rinses twice daily  . losartan-hydrochlorothiazide (HYZAAR) 50-12.5 MG tablet Take 1 tablet by mouth daily.  . sertraline (ZOLOFT) 50 MG tablet TAKE 1 AND 1/2 TABLETS BY  MOUTH DAILY  . vitamin B-12 (CYANOCOBALAMIN) 500 MCG tablet Take 1 tablet (500 mcg total) by mouth daily.   Current Facility-Administered Medications for the 12/11/19 encounter (Office Visit) with Elouise Munroe, MD  Medication  . 0.9 %  sodium chloride infusion     Allergies:   Ibuprofen   Social History   Socioeconomic History  . Marital status: Widowed    Spouse name: Not on file  . Number of children: Not on  file  . Years of education: Not on file  . Highest education level: Not on file  Occupational History  . Not on file  Tobacco Use  . Smoking status: Never Smoker  . Smokeless tobacco: Never Used  Substance and Sexual Activity  . Alcohol use: Yes    Alcohol/week: 14.0 standard drinks    Types: 14 Glasses of wine per week  . Drug use: No  . Sexual activity: Not Currently  Other Topics Concern  . Not on file  Social History Narrative  . Not on file   Social Determinants of Health   Financial Resource Strain:   . Difficulty of Paying Living Expenses:   Food Insecurity:   . Worried  About Charity fundraiser in the Last Year:   . Arboriculturist in the Last Year:   Transportation Needs:   . Film/video editor (Medical):   Marland Kitchen Lack of Transportation (Non-Medical):   Physical Activity:   . Days of Exercise per Week:   . Minutes of Exercise per Session:   Stress:   . Feeling of Stress :   Social Connections:   . Frequency of Communication with Friends and Family:   . Frequency of Social Gatherings with Friends and Family:   . Attends Religious Services:   . Active Member of Clubs or Organizations:   . Attends Archivist Meetings:   Marland Kitchen Marital Status:      Family History: The patient's family history includes Breast cancer in her mother; Cancer (age of onset: 56) in her mother; Heart disease in her father. There is no history of Colon cancer, Esophageal cancer, Rectal cancer, or Stomach cancer.  ROS:   Please see the history of present illness.    All other systems reviewed and are negative.  EKGs/Labs/Other Studies Reviewed:    The following studies were reviewed today:  EKG:  NSR  Recent Labs: 08/11/2019: ALT 11; BUN 27; Creatinine, Ser 0.78; Hemoglobin 13.9; Magnesium 2.1; Platelets 310; Potassium 4.2; Sodium 141; TSH 2.470  Recent Lipid Panel    Component Value Date/Time   CHOL 171 08/11/2019 0933   TRIG 61 08/11/2019 0933   HDL 76 08/11/2019 0933   CHOLHDL 2.3 08/11/2019 0933   CHOLHDL 2.9 07/11/2016 0923   VLDL 10 07/11/2016 0923   LDLCALC 83 08/11/2019 0933    Physical Exam:    VS:  BP 120/80   Pulse 88   Ht 5\' 2"  (1.575 m)   Wt 135 lb (61.2 kg)   SpO2 98%   BMI 24.69 kg/m     Wt Readings from Last 5 Encounters:  12/11/19 135 lb (61.2 kg)  11/26/19 133 lb (60.3 kg)  09/09/19 140 lb (63.5 kg)  08/20/19 130 lb (59 kg)  08/13/19 133 lb (60.3 kg)     Constitutional: No acute distress Eyes: sclera non-icteric, normal conjunctiva and lids ENMT: normal dentition, moist mucous membranes Cardiovascular: regular rhythm,  normal rate, no murmurs. S1 and S2 normal. Radial pulses normal bilaterally. No jugular venous distention.  Respiratory: clear to auscultation bilaterally GI : normal bowel sounds, soft and nontender. No distention.   MSK: extremities warm, well perfused. No edema.  NEURO: grossly nonfocal exam, moves all extremities. PSYCH: alert and oriented x 3, normal mood and affect.   ASSESSMENT:    1. DOE (dyspnea on exertion)   2. Dizziness   3. Mixed hyperlipidemia   4. HTN (hypertension), benign   5. History of breast Cancer  PLAN:    Dizziness DOE (dyspnea on exertion) - Plan: EKG 12-Lead, ECHOCARDIOGRAM COMPLETE  She describes orthostatic hypotension and dizziness.  I have offered a cardiac monitor for evaluation of arrhythmia as a source of dizziness which she defers at this time.  We will obtain particularly given her history of breast cancer with chemotherapy to ensure no cardiomyopathic process is contributing.  With dyspnea on exertion we could also consider ischemic testing, however the patient and I have participated in shared decision and determined a stepwise approach is appropriate.  Mixed hyperlipidemia-continue atorvastatin  HTN (hypertension)-continue losartan HCTZ  History of breast Cancer-we will obtain echocardiogram with strain imaging as above.  Cherlynn Kaiser, MD [place]  [facility]    Medication Adjustments/Labs and Tests Ordered: Current medicines are reviewed at length with the patient today.  Concerns regarding medicines are outlined above.  Orders Placed This Encounter  Procedures  . EKG 12-Lead  . ECHOCARDIOGRAM COMPLETE   No orders of the defined types were placed in this encounter.   Patient Instructions  Medication Instructions:  NOT NEEDED  *If you need a refill on your cardiac medications before your next appointment, please call your pharmacy*   Lab Work: NOT NEEDED   Testing/Procedures:  WILL BE SCHEDULE AT [place] [address] Your physician has requested that you have an echocardiogram. Echocardiography is a painless test that uses sound waves to create images of your heart. It provides your doctor with information about the size and shape of your heart and how well your heart's chambers and valves are working. This procedure takes approximately one hour. There are no restrictions for this procedure.     Follow-Up: At [facility], you and your health needs are our priority.  As part of our continuing mission to provide you with exceptional heart care, we have created designated Provider Care Teams.  These Care Teams include your primary Cardiologist (physician) and Advanced Practice Providers (APPs -  Physician Assistants and Nurse Practitioners) who all work together to provide you with the care you need, when you need it.   Your next appointment:   6 TO 8 week(s)  The format for your next appointment:   In Person  Provider:   Cherlynn Kaiser, MD   Other Instructions NO A/

## 2019-12-11 NOTE — Patient Instructions (Signed)
Medication Instructions:  NOT NEEDED  *If you need a refill on your cardiac medications before your next appointment, please call your pharmacy*   Lab Work: NOT NEEDED   Testing/Procedures:  WILL BE SCHEDULE AT [place] [address] Your physician has requested that you have an echocardiogram. Echocardiography is a painless test that uses sound waves to create images of your heart. It provides your doctor with information about the size and shape of your heart and how well your heart's chambers and valves are working. This procedure takes approximately one hour. There are no restrictions for this procedure.     Follow-Up: At [facility], you and your health needs are our priority.  As part of our continuing mission to provide you with exceptional heart care, we have created designated Provider Care Teams.  These Care Teams include your primary Cardiologist (physician) and Advanced Practice Providers (APPs -  Physician Assistants and Nurse Practitioners) who all work together to provide you with the care you need, when you need it.   Your next appointment:   6 TO 8 week(s)  The format for your next appointment:   In Person  Provider:   Cherlynn Kaiser, MD   Other Instructions NO A/

## 2019-12-14 DIAGNOSIS — Z961 Presence of intraocular lens: Secondary | ICD-10-CM | POA: Diagnosis not present

## 2019-12-14 DIAGNOSIS — H26492 Other secondary cataract, left eye: Secondary | ICD-10-CM | POA: Diagnosis not present

## 2019-12-14 DIAGNOSIS — I10 Essential (primary) hypertension: Secondary | ICD-10-CM | POA: Diagnosis not present

## 2019-12-14 DIAGNOSIS — H26491 Other secondary cataract, right eye: Secondary | ICD-10-CM | POA: Diagnosis not present

## 2019-12-31 ENCOUNTER — Other Ambulatory Visit: Payer: Self-pay

## 2019-12-31 ENCOUNTER — Ambulatory Visit (HOSPITAL_COMMUNITY): Payer: Medicare Other | Attending: Cardiovascular Disease

## 2019-12-31 DIAGNOSIS — T451X5A Adverse effect of antineoplastic and immunosuppressive drugs, initial encounter: Secondary | ICD-10-CM | POA: Diagnosis not present

## 2019-12-31 DIAGNOSIS — D701 Agranulocytosis secondary to cancer chemotherapy: Secondary | ICD-10-CM | POA: Diagnosis not present

## 2019-12-31 DIAGNOSIS — R0600 Dyspnea, unspecified: Secondary | ICD-10-CM | POA: Insufficient documentation

## 2019-12-31 DIAGNOSIS — D6481 Anemia due to antineoplastic chemotherapy: Secondary | ICD-10-CM

## 2019-12-31 DIAGNOSIS — R0609 Other forms of dyspnea: Secondary | ICD-10-CM

## 2020-01-06 DIAGNOSIS — H26492 Other secondary cataract, left eye: Secondary | ICD-10-CM | POA: Diagnosis not present

## 2020-01-18 ENCOUNTER — Other Ambulatory Visit: Payer: Self-pay | Admitting: Family Medicine

## 2020-01-18 DIAGNOSIS — F32A Depression, unspecified: Secondary | ICD-10-CM

## 2020-01-18 DIAGNOSIS — F430 Acute stress reaction: Secondary | ICD-10-CM

## 2020-01-18 DIAGNOSIS — F341 Dysthymic disorder: Secondary | ICD-10-CM

## 2020-01-18 DIAGNOSIS — E782 Mixed hyperlipidemia: Secondary | ICD-10-CM

## 2020-01-22 ENCOUNTER — Encounter: Payer: Self-pay | Admitting: Internal Medicine

## 2020-01-22 ENCOUNTER — Other Ambulatory Visit: Payer: Self-pay

## 2020-01-22 ENCOUNTER — Ambulatory Visit (INDEPENDENT_AMBULATORY_CARE_PROVIDER_SITE_OTHER): Payer: Medicare Other | Admitting: Internal Medicine

## 2020-01-22 VITALS — BP 118/68 | HR 82 | Ht 62.0 in | Wt 133.2 lb

## 2020-01-22 DIAGNOSIS — C801 Malignant (primary) neoplasm, unspecified: Secondary | ICD-10-CM

## 2020-01-22 DIAGNOSIS — I10 Essential (primary) hypertension: Secondary | ICD-10-CM | POA: Diagnosis not present

## 2020-01-22 DIAGNOSIS — R42 Dizziness and giddiness: Secondary | ICD-10-CM

## 2020-01-22 DIAGNOSIS — R0600 Dyspnea, unspecified: Secondary | ICD-10-CM

## 2020-01-22 DIAGNOSIS — E782 Mixed hyperlipidemia: Secondary | ICD-10-CM | POA: Diagnosis not present

## 2020-01-22 DIAGNOSIS — R0609 Other forms of dyspnea: Secondary | ICD-10-CM

## 2020-01-22 NOTE — Progress Notes (Signed)
Cardiology Office Note:    Date:  01/22/2020   ID:  Denise Wolfe, DOB [birth year], MRN [id number]  PCP:  Lorrene Reid, PA-C  Cardiologist:  No primary care provider on file.  Electrophysiologist:  None   Referring MD: [name], DO   Chief Complaint: Follow-up dizziness, orthostatic hypotension, history of chemotherapy  History of Present Illness:    Denise Wolfe is a 79 y.o. female with a history of breast cancer with chemotherapy and radiation, colon cancer, depression, hypertension who presents for follow-up of testing.  I had offered the patient a cardiac monitor at her last visit which she deferred.  We discussed the results of echocardiogram which were overall reassuring.  Normal ejection fraction and normal strain imaging.  Grade 1 diastolic dysfunction which does not give her significant symptoms.  Overall she is doing well and denies chest pain, PND, orthopnea, syncope, leg swelling.  Continues to have mild shortness of breath while vacuuming but she shovels and does yard work and has no symptoms with these activities.  I have offered her stress testing to further evaluate and she defers.   Past Medical History:  Diagnosis Date  . Anemia    during childhood  . Arthritis    hands, shoulder  . Breast cancer ([place]) 03/2000   left side modified mastecomy  . Cancer [facility])    breast, age 37; left side  . Colon cancer ([place]) 02/11/2017   rectum-squamous cell carcinoma  . Depression   . Hypertension   . Personal history of chemotherapy   . Personal history of radiation therapy   . Squamous cell carcinoma of rectum ([place]) 02/14/2017    Past Surgical History:  Procedure Laterality Date  . ABDOMINAL HYSTERECTOMY     With bilateral oophorectomy. 2002.  Marland Kitchen BREAST BIOPSY Right 2004   benign  . BREAST SURGERY Left 2001   biopsy  . CATARACT EXTRACTION Bilateral 2017  . IR FLUORO GUIDE CV LINE RIGHT  03/11/2017  . IR FLUORO GUIDE CV LINE RIGHT  04/16/2017  . IR US GUIDE VASC  ACCESS RIGHT  03/11/2017  . IR US GUIDE VASC ACCESS RIGHT  04/16/2017  . MASTECTOMY MODIFIED RADICAL Left 2001  . MENISCUS DEBRIDEMENT Right 2003  . TUMOR EXCISION Right    thigh area    Current Medications: Current Meds  Medication Sig  . amphetamine-dextroamphetamine (ADDERALL) 10 MG tablet Take 1 tablet (10 mg total) by mouth every morning.  Marland Kitchen atorvastatin (LIPITOR) 80 MG tablet TAKE ONE-HALF TABLET BY  MOUTH DAILY AT 6 PM.  . azelastine (ASTELIN) 0.1 % nasal spray Place 1 spray into both nostrils 2 (two) times daily. 1 spray each nostril twice daily after sinus rinses  . cholecalciferol (VITAMIN D) 400 units TABS tablet Take 400 Units by mouth daily. Patient is not sure of the units.  . fluticasone (FLONASE) 50 MCG/ACT nasal spray 1 spray each nostril following sinus rinses twice daily  . losartan-hydrochlorothiazide (HYZAAR) 50-12.5 MG tablet Take 1 tablet by mouth daily.  . sertraline (ZOLOFT) 50 MG tablet TAKE 1 AND 1/2 TABLETS BY  MOUTH DAILY  . vitamin B-12 (CYANOCOBALAMIN) 500 MCG tablet Take 1 tablet (500 mcg total) by mouth daily.   Current Facility-Administered Medications for the 01/22/20 encounter (Office Visit) with Elouise Munroe, MD  Medication  . 0.9 %  sodium chloride infusion     Allergies:   Ibuprofen   Social History   Socioeconomic History  . Marital status: Widowed    Spouse name: Not  on file  . Number of children: Not on file  . Years of education: Not on file  . Highest education level: Not on file  Occupational History  . Not on file  Tobacco Use  . Smoking status: Never Smoker  . Smokeless tobacco: Never Used  Substance and Sexual Activity  . Alcohol use: Yes    Alcohol/week: 14.0 standard drinks    Types: 14 Glasses of wine per week  . Drug use: No  . Sexual activity: Not Currently  Other Topics Concern  . Not on file  Social History Narrative  . Not on file   Social Determinants of Health   Financial Resource Strain:   . Difficulty  of Paying Living Expenses:   Food Insecurity:   . Worried About Charity fundraiser in the Last Year:   . Arboriculturist in the Last Year:   Transportation Needs:   . Film/video editor (Medical):   Marland Kitchen Lack of Transportation (Non-Medical):   Physical Activity:   . Days of Exercise per Week:   . Minutes of Exercise per Session:   Stress:   . Feeling of Stress :   Social Connections:   . Frequency of Communication with Friends and Family:   . Frequency of Social Gatherings with Friends and Family:   . Attends Religious Services:   . Active Member of Clubs or Organizations:   . Attends Archivist Meetings:   Marland Kitchen Marital Status:      Family History: The patient's family history includes Breast cancer in her mother; Cancer (age of onset: 76) in her mother; Heart disease in her father. There is no history of Colon cancer, Esophageal cancer, Rectal cancer, or Stomach cancer.  ROS:   Please see the history of present illness.    All other systems reviewed and are negative.  EKGs/Labs/Other Studies Reviewed:    The following studies were reviewed today:  EKG:  na  Recent Labs: 08/11/2019: ALT 11; BUN 27; Creatinine, Ser 0.78; Hemoglobin 13.9; Magnesium 2.1; Platelets 310; Potassium 4.2; Sodium 141; TSH 2.470  Recent Lipid Panel    Component Value Date/Time   CHOL 171 08/11/2019 0933   TRIG 61 08/11/2019 0933   HDL 76 08/11/2019 0933   CHOLHDL 2.3 08/11/2019 0933   CHOLHDL 2.9 07/11/2016 0923   VLDL 10 07/11/2016 0923   LDLCALC 83 08/11/2019 0933    Physical Exam:    VS:  BP 118/68   Pulse 82   Ht 5\' 2"  (1.575 m)   Wt 133 lb 3.2 oz (60.4 kg)   SpO2 94%   BMI 24.36 kg/m     Wt Readings from Last 5 Encounters:  01/22/20 133 lb 3.2 oz (60.4 kg)  12/11/19 135 lb (61.2 kg)  11/26/19 133 lb (60.3 kg)  09/09/19 140 lb (63.5 kg)  08/20/19 130 lb (59 kg)     Constitutional: No acute distress Eyes: sclera non-icteric, normal conjunctiva and lids ENMT:  normal dentition, moist mucous membranes Cardiovascular: regular rhythm, normal rate. No jugular venous distention.  Respiratory: No increased work of breathing GI : soft and nontender. No distention.   MSK: extremities warm, well perfused. No edema.  NEURO: grossly nonfocal exam, moves all extremities. PSYCH: alert and oriented x 3, normal mood and affect.   ASSESSMENT:    1. DOE (dyspnea on exertion)   2. Dizziness   3. Mixed hyperlipidemia   4. HTN (hypertension), benign   5. History of breast Cancer  PLAN:    DOE (dyspnea on exertion)  Dizziness  Mixed hyperlipidemia  HTN (hypertension), benign  History of breast Cancer   We reviewed the results of her echocardiogram which were reassuring particularly that her strain imaging was normal after chemotherapy.  She is reassured.  We discussed further testing to better understand the nature of her shortness of breath with exertion, she would like to defer at this time which is reasonable.  For hyperlipidemia continue atorvastatin.  For hypertension continue losartan HCTZ.  We will have the patient follow-up in 6 months to review symptoms.  Total time of encounter: 20 minutes total time of encounter, including 15 minutes spent in face-to-face patient care on the date of this encounter. This time includes coordination of care and counseling regarding above mentioned problem list. Remainder of non-face-to-face time involved reviewing chart documents/testing relevant to the patient encounter and documentation in the medical record. I have independently reviewed documentation from referring provider.   Cherlynn Kaiser, MD [place]  [facility]    Medication Adjustments/Labs and Tests Ordered: Current medicines are reviewed at length with the patient today.  Concerns regarding medicines are outlined above.  No orders of the defined types were placed in this encounter.  No orders of the defined types were placed in this  encounter.   Patient Instructions  Medication Instructions:  No changes *If you need a refill on your cardiac medications before your next appointment, please call your pharmacy*   Lab Work: Not needed   Testing/Procedures: not needed   Follow-Up: At [facility], you and your health needs are our priority.  As part of our continuing mission to provide you with exceptional heart care, we have created designated Provider Care Teams.  These Care Teams include your primary Cardiologist (physician) and Advanced Practice Providers (APPs -  Physician Assistants and Nurse Practitioners) who all work together to provide you with the care you need, when you need it.  We recommend signing up for the patient portal called "MyChart".  Sign up information is provided on this After Visit Summary.  MyChart is used to connect with patients for Virtual Visits (Telemedicine).  Patients are able to view lab/test results, encounter notes, upcoming appointments, etc.  Non-urgent messages can be sent to your provider as well.   To learn more about what you can do with MyChart, go to [email or web address].    Your next appointment:   6 month(s)  The format for your next appointment:   In Person  Provider:   Cherlynn Kaiser, MD

## 2020-01-22 NOTE — Patient Instructions (Signed)
Medication Instructions:  No changes *If you need a refill on your cardiac medications before your next appointment, please call your pharmacy*   Lab Work: Not needed   Testing/Procedures: not needed   Follow-Up: At [facility], you and your health needs are our priority.  As part of our continuing mission to provide you with exceptional heart care, we have created designated Provider Care Teams.  These Care Teams include your primary Cardiologist (physician) and Advanced Practice Providers (APPs -  Physician Assistants and Nurse Practitioners) who all work together to provide you with the care you need, when you need it.  We recommend signing up for the patient portal called "MyChart".  Sign up information is provided on this After Visit Summary.  MyChart is used to connect with patients for Virtual Visits (Telemedicine).  Patients are able to view lab/test results, encounter notes, upcoming appointments, etc.  Non-urgent messages can be sent to your provider as well.   To learn more about what you can do with MyChart, go to [email or web address].    Your next appointment:   6 month(s)  The format for your next appointment:   In Person  Provider:   Cherlynn Kaiser, MD

## 2020-02-07 ENCOUNTER — Other Ambulatory Visit: Payer: Self-pay | Admitting: Family Medicine

## 2020-02-07 DIAGNOSIS — I10 Essential (primary) hypertension: Secondary | ICD-10-CM

## 2020-02-23 ENCOUNTER — Other Ambulatory Visit: Payer: Self-pay

## 2020-02-23 ENCOUNTER — Ambulatory Visit (INDEPENDENT_AMBULATORY_CARE_PROVIDER_SITE_OTHER): Payer: Medicare Other | Admitting: Physician Assistant

## 2020-02-23 ENCOUNTER — Encounter: Payer: Self-pay | Admitting: Physician Assistant

## 2020-02-23 VITALS — BP 132/76 | HR 72 | Temp 98.2°F | Ht 62.0 in | Wt 133.2 lb

## 2020-02-23 DIAGNOSIS — I10 Essential (primary) hypertension: Secondary | ICD-10-CM | POA: Diagnosis not present

## 2020-02-23 DIAGNOSIS — F988 Other specified behavioral and emotional disorders with onset usually occurring in childhood and adolescence: Secondary | ICD-10-CM

## 2020-02-23 DIAGNOSIS — R42 Dizziness and giddiness: Secondary | ICD-10-CM | POA: Diagnosis not present

## 2020-02-23 DIAGNOSIS — M25531 Pain in right wrist: Secondary | ICD-10-CM | POA: Diagnosis not present

## 2020-02-23 DIAGNOSIS — M79644 Pain in right finger(s): Secondary | ICD-10-CM

## 2020-02-23 DIAGNOSIS — M25511 Pain in right shoulder: Secondary | ICD-10-CM | POA: Diagnosis not present

## 2020-02-23 MED ORDER — AMPHETAMINE-DEXTROAMPHETAMINE 10 MG PO TABS: 10.0000 mg | ORAL_TABLET | ORAL | 0 refills | Status: DC

## 2020-02-23 NOTE — Assessment & Plan Note (Signed)
-   BP today is 132/76 HR 72, stable. - Continue Hyzaar 50-12.5 mg - Continue ambulatory BP and pulse monitoring and keep a log. - Follow DASH diet. - Stay well hydrated, at least 64 fl oz - Encourage to stay as active as possible.

## 2020-02-23 NOTE — Progress Notes (Signed)
Established Patient Office Visit  Subjective:  Patient ID: Denise Wolfe, female    DOB: October 01, 1940  Age: 79 y.o. MRN: [id number]  CC:  Chief Complaint  Patient presents with   ADHD    HPI Denise Wolfe presents for ADHD management. At last OV pt was referred to cardiology for dizziness and orthostatic hypotension.  ADHD: Reports she has been on Adderall for a long time, and it continues to help with being able to focus and concentrate. Tolerating medication without issues.  Dizziness, orthostatic hypotension: Patient evaluated by cardiology-Dr. Cherlynn Kaiser and had an echocardiogram done which was overall normal, revealed grade 1 diastolic dysfunction.  HTN: Pt denies chest pain, palpitations, or lower extremity swelling. Taking medication as directed without side effects. Checks BP at home occasionally and readings range in 130s/70-80s. Pt has reduced salt intake.  Reports good hydration.  Right shoulder pain, right wrist and thumb pain: Requesting referral to sports medicine by [facility], where she went previously for trigger finger injection.  She has chronic right shoulder pain and reports it has been four years since she had a joint injection.  She does not like taking medications but cannot handle the pain anymore.  She is interested in steroid joint injections.  Past Medical History:  Diagnosis Date   Anemia    during childhood   Arthritis    hands, shoulder   Breast cancer ([place]) 03/2000   left side modified mastecomy   Cancer [facility])    breast, age 50; left side   Colon cancer ([place]) 02/11/2017   rectum-squamous cell carcinoma   Depression    Hypertension    Personal history of chemotherapy    Personal history of radiation therapy    Squamous cell carcinoma of rectum ([place]) 02/14/2017    Past Surgical History:  Procedure Laterality Date   ABDOMINAL HYSTERECTOMY     With bilateral oophorectomy. 2002.   BREAST BIOPSY Right 2004   benign   BREAST SURGERY  Left 2001   biopsy   CATARACT EXTRACTION Bilateral 2017   IR FLUORO GUIDE CV LINE RIGHT  03/11/2017   IR FLUORO GUIDE CV LINE RIGHT  04/16/2017   IR US GUIDE VASC ACCESS RIGHT  03/11/2017   IR US GUIDE VASC ACCESS RIGHT  04/16/2017   MASTECTOMY MODIFIED RADICAL Left 2001   MENISCUS DEBRIDEMENT Right 2003   TUMOR EXCISION Right    thigh area    Family History  Problem Relation Age of Onset   Cancer Mother 30       breast   Breast cancer Mother    Heart disease Father    Colon cancer Neg Hx    Esophageal cancer Neg Hx    Rectal cancer Neg Hx    Stomach cancer Neg Hx     Social History   Socioeconomic History   Marital status: Widowed    Spouse name: Not on file   Number of children: Not on file   Years of education: Not on file   Highest education level: Not on file  Occupational History   Not on file  Tobacco Use   Smoking status: Never Smoker   Smokeless tobacco: Never Used  Substance and Sexual Activity   Alcohol use: Yes    Alcohol/week: 14.0 standard drinks    Types: 14 Glasses of wine per week   Drug use: No   Sexual activity: Not Currently  Other Topics Concern   Not on file  Social History Narrative   Not on  file   Social Determinants of Health   Financial Resource Strain:    Difficulty of Paying Living Expenses:   Food Insecurity:    Worried About Charity fundraiser in the Last Year:    Arboriculturist in the Last Year:   Transportation Needs:    Film/video editor (Medical):    Lack of Transportation (Non-Medical):   Physical Activity:    Days of Exercise per Week:    Minutes of Exercise per Session:   Stress:    Feeling of Stress :   Social Connections:    Frequency of Communication with Friends and Family:    Frequency of Social Gatherings with Friends and Family:    Attends Religious Services:    Active Member of Clubs or Organizations:    Attends Music therapist:    Marital Status:    Intimate Partner Violence:    Fear of Current or Ex-Partner:    Emotionally Abused:    Physically Abused:    Sexually Abused:     Outpatient Medications Prior to Visit  Medication Sig Dispense Refill   cholecalciferol (VITAMIN D) 400 units TABS tablet Take 400 Units by mouth daily. Patient is not sure of the units.     fluticasone (FLONASE) 50 MCG/ACT nasal spray 1 spray each nostril following sinus rinses twice daily 16 g 5   vitamin B-12 (CYANOCOBALAMIN) 500 MCG tablet Take 1 tablet (500 mcg total) by mouth daily.     amphetamine-dextroamphetamine (ADDERALL) 10 MG tablet Take 1 tablet (10 mg total) by mouth every morning. 90 tablet 0   atorvastatin (LIPITOR) 80 MG tablet TAKE ONE-HALF TABLET BY  MOUTH DAILY AT 6 PM. 45 tablet 1   losartan-hydrochlorothiazide (HYZAAR) 50-12.5 MG tablet Take 1 tablet by mouth daily. 90 tablet 1   sertraline (ZOLOFT) 50 MG tablet TAKE 1 AND 1/2 TABLETS BY  MOUTH DAILY 135 tablet 0   azelastine (ASTELIN) 0.1 % nasal spray Place 1 spray into both nostrils 2 (two) times daily. 1 spray each nostril twice daily after sinus rinses 30 mL 5   Facility-Administered Medications Prior to Visit  Medication Dose Route Frequency Provider Last Rate Last Admin   0.9 %  sodium chloride infusion  500 mL Intravenous Continuous [name], MD        Allergies  Allergen Reactions   Ibuprofen Rash    ROS Review of Systems   A fourteen system review of systems was performed and found to be positive as per HPI.  Objective:    Physical Exam General:  Well Developed, well nourished, appropriate for stated age.  Neuro:  Alert and oriented,  extra-ocular muscles intact, no focal deficits  HEENT:  Normocephalic, atraumatic, neck supple, no carotid bruits appreciated  Skin:  no gross rash, warm, pink. Cardiac:  RRR, S1 S2, no murmur Respiratory:  ECTA B/L, Not using accessory muscles, speaking in full sentences- unlabored. Vascular:  Ext warm, no  cyanosis apprec.; cap RF less 2 sec. Psych:  No HI/SI, judgement and insight good, Euthymic mood. Full Affect.   BP 132/76    Pulse 72    Temp 98.2 F (36.8 C) (Oral)    Ht '5\' 2"'$  (1.575 m)    Wt 133 lb 3.2 oz (60.4 kg)    SpO2 96% Comment: on RA   BMI 24.36 kg/m  Wt Readings from Last 3 Encounters:  02/23/20 133 lb 3.2 oz (60.4 kg)  01/22/20 133 lb 3.2 oz (60.4 kg)  12/11/19 135 lb (61.2 kg)     Health Maintenance Due  Topic Date Due   Hepatitis C Screening  Never done   COVID-19 Vaccine (1) Never done   TETANUS/TDAP  05/17/2014    There are no preventive care reminders to display for this patient.  Lab Results  Component Value Date   TSH 2.470 08/11/2019   Lab Results  Component Value Date   WBC 3.2 (L) 08/11/2019   HGB 13.9 08/11/2019   HCT 39.1 08/11/2019   MCV 90 08/11/2019   PLT 310 08/11/2019   Lab Results  Component Value Date   NA 141 08/11/2019   K 4.2 08/11/2019   CHLORIDE 105 06/11/2017   CO2 23 08/11/2019   GLUCOSE 100 (H) 08/11/2019   BUN 27 08/11/2019   CREATININE 0.78 08/11/2019   BILITOT <0.2 08/11/2019   ALKPHOS 46 08/11/2019   AST 19 08/11/2019   ALT 11 08/11/2019   PROT 6.3 08/11/2019   ALBUMIN 4.3 08/11/2019   CALCIUM 9.9 08/11/2019   ANIONGAP 9 07/31/2018   EGFR >60 06/11/2017   GFR 74.49 02/15/2017   Lab Results  Component Value Date   CHOL 171 08/11/2019   Lab Results  Component Value Date   HDL 76 08/11/2019   Lab Results  Component Value Date   LDLCALC 83 08/11/2019   Lab Results  Component Value Date   TRIG 61 08/11/2019   Lab Results  Component Value Date   CHOLHDL 2.3 08/11/2019   Lab Results  Component Value Date   HGBA1C 5.1 07/11/2016      Assessment & Plan:   Problem List Items Addressed This Visit      Cardiovascular and Mediastinum   HTN (hypertension), benign (Chronic)    - BP today is 132/76 HR 72, stable. - Continue Hyzaar 50-12.5 mg - Continue ambulatory BP and pulse monitoring and keep a  log. - Follow DASH diet. - Stay well hydrated, at least 64 fl oz - Encourage to stay as active as possible.         Other   Pain in joint, shoulder region (Chronic)   Relevant Orders   Ambulatory referral to Sports Medicine   Attention deficit disorder - Primary (Chronic)   Relevant Medications   amphetamine-dextroamphetamine (ADDERALL) 10 MG tablet    Other Visit Diagnoses    Right wrist pain       Relevant Orders   Ambulatory referral to Sports Medicine   Pain of right thumb       Relevant Orders   Ambulatory referral to Sports Medicine   Dizziness of unknown cause- improving         ADHD: -Stable -Reviewed PDMP, no aberrancies noted. Provided refill. -Follow up in 3 months for medication management.  Right shoulder pain, right wrist and thumb pain: -Pt wants joint injections, placed referral to Sports Medicine.  -Recommend to avoid exacerbating activities, topical anti-inflammatories such as Voltaren gel, and supportive therapy.     Meds ordered this encounter  Medications   amphetamine-dextroamphetamine (ADDERALL) 10 MG tablet    Sig: Take 1 tablet (10 mg total) by mouth every morning.    Dispense:  90 tablet    Refill:  0    Follow-up: Return in about 3 months (around 05/25/2020) for HTN, HLD, ADHD and FBW (lipid panel, cmp, cbc) few days prior to OV.    Lorrene Reid, PA-C

## 2020-03-10 ENCOUNTER — Telehealth: Payer: Self-pay | Admitting: Physician Assistant

## 2020-03-10 DIAGNOSIS — F430 Acute stress reaction: Secondary | ICD-10-CM

## 2020-03-10 DIAGNOSIS — F32A Depression, unspecified: Secondary | ICD-10-CM

## 2020-03-10 DIAGNOSIS — E782 Mixed hyperlipidemia: Secondary | ICD-10-CM

## 2020-03-10 DIAGNOSIS — F341 Dysthymic disorder: Secondary | ICD-10-CM

## 2020-03-10 DIAGNOSIS — I10 Essential (primary) hypertension: Secondary | ICD-10-CM

## 2020-03-10 MED ORDER — LOSARTAN POTASSIUM-HCTZ 50-12.5 MG PO TABS: 1.0000 | ORAL_TABLET | Freq: Every day | ORAL | 0 refills | Status: DC

## 2020-03-10 MED ORDER — ATORVASTATIN CALCIUM 80 MG PO TABS: 40.0000 mg | ORAL_TABLET | Freq: Every day | ORAL | 0 refills | Status: DC

## 2020-03-10 MED ORDER — SERTRALINE HCL 50 MG PO TABS: 75.0000 mg | ORAL_TABLET | Freq: Every day | ORAL | 0 refills | Status: DC

## 2020-03-10 NOTE — Telephone Encounter (Signed)
Patient is requesting a refill of her atorvastatin, sertraline, and losatran-HCTZ. If approved please send to [facility]

## 2020-03-10 NOTE — Telephone Encounter (Signed)
Refill sent to requested pharmacy. AS, CMA 

## 2020-03-10 NOTE — Addendum Note (Signed)
Addended by: Mickel Crow on: 03/10/2020 03:06 PM   Modules accepted: Orders

## 2020-03-15 ENCOUNTER — Ambulatory Visit: Payer: Medicare Other | Admitting: Sports Medicine

## 2020-03-17 ENCOUNTER — Ambulatory Visit (INDEPENDENT_AMBULATORY_CARE_PROVIDER_SITE_OTHER): Payer: Medicare Other | Admitting: Sports Medicine

## 2020-03-17 ENCOUNTER — Other Ambulatory Visit: Payer: Self-pay

## 2020-03-17 ENCOUNTER — Ambulatory Visit
Admission: RE | Admit: 2020-03-17 | Discharge: 2020-03-17 | Disposition: A | Discharge status: Home / Self Care | Payer: Medicare Other | Source: Ambulatory Visit | Attending: Sports Medicine | Admitting: Sports Medicine

## 2020-03-17 VITALS — BP 142/74 | Ht 62.0 in | Wt 133.0 lb

## 2020-03-17 DIAGNOSIS — M19041 Primary osteoarthritis, right hand: Secondary | ICD-10-CM | POA: Diagnosis not present

## 2020-03-17 DIAGNOSIS — M25561 Pain in right knee: Secondary | ICD-10-CM | POA: Diagnosis not present

## 2020-03-17 DIAGNOSIS — G8929 Other chronic pain: Secondary | ICD-10-CM | POA: Diagnosis not present

## 2020-03-17 DIAGNOSIS — M79644 Pain in right finger(s): Secondary | ICD-10-CM | POA: Diagnosis not present

## 2020-03-17 DIAGNOSIS — M25461 Effusion, right knee: Secondary | ICD-10-CM | POA: Diagnosis not present

## 2020-03-17 DIAGNOSIS — M19011 Primary osteoarthritis, right shoulder: Secondary | ICD-10-CM | POA: Diagnosis not present

## 2020-03-17 DIAGNOSIS — M25511 Pain in right shoulder: Secondary | ICD-10-CM

## 2020-03-17 MED ORDER — METHYLPREDNISOLONE ACETATE 40 MG/ML IJ SUSP
40.0000 mg | Freq: Once | INTRAMUSCULAR | Status: AC
  Administered 2020-03-17: 40 mg via INTRA_ARTICULAR

## 2020-03-18 ENCOUNTER — Encounter: Payer: Self-pay | Admitting: Sports Medicine

## 2020-03-18 NOTE — Progress Notes (Signed)
Subjective:    Patient ID: Denise Wolfe, female    DOB: [birth year], 79 y.o.   MRN: [id number]  HPI chief complaint: Right shoulder, right thumb, and right knee pain  79 year old female comes in today with several different complaints.  Primary complaint is right shoulder pain.  She has a documented history of DJD in this same shoulder.  X-rays from 2017 showed moderately advanced DJD.  She did well with an intra-articular cortisone injection but her pain has returned.  She is also noticing limited range of motion.  Pain is diffuse around the shoulder.  She denies numbness or tingling into the arm.  No recent trauma. She is also complaining of chronic right thumb pain.  She received an injection into the [facility] joint several years ago which was helpful.  Pain is localized to the [facility] joint.  Again no trauma. She has chronic right knee pain.  She is status post ACL tear remotely which was treated conservatively.  She did eventually undergo a knee arthroscopy.  Her pain in the knee is diffuse.  Intermittent swelling.  Past medical history reviewed Medications reviewed Allergies reviewed    Review of Systems    As above Objective:   Physical Exam  Well-developed, well-nourished.  No acute distress.  Sitting comfortably in the exam room  Right shoulder: Limited active and passive range of motion in all planes.  Patient has reproducible pain with passive external rotation and abduction.  No atrophy.  Strength testing is limited by pain.  She is neurovascularly intact distally.  Right thumb: There is hypertrophy at the [facility] joint.  Severely limited passive range of motion at at the [facility] joint.  She is tender to palpation here.  No effusion.  Brisk capillary refill.  Right knee: Full range of motion.  No obvious effusion.  She is tender to palpation along the medial joint line but negative McMurray's.  Knee is stable to valgus and varus stressing.  She has a 1+ anterior drawer.  Negative posterior  drawer.  Neurovascular intact distally.  X-rays of the right shoulder including AP and axillary views show advanced glenohumeral and acromioclavicular DJD. X-rays of the right thumb show osteoarthritic changes at the IP joint, MCP joint, and CMC joint.  Moderately advanced DJD at the MCP joint. X-rays of the right knee including a standing AP shows moderate tricompartmental DJD with a slight effusion.       Assessment & Plan:   Right shoulder pain secondary to advanced glenohumeral DJD Right thumb pain secondary to [facility] osteoarthritis Right knee pain secondary to tricompartmental DJD  Patient's right shoulder is injected today with cortisone.  Needle is directed into the glenohumeral joint.  Patient tolerated this without difficulty.  She will follow up with me in 2 weeks for reevaluation.  I will plan on discussing her x-rays with her at that visit and we will also plan on proceeding with an ultrasound-guided [facility] cortisone injection.  If her shoulder pain persists despite today's injection then I will consider referral to either Dr. Tamera Punt or Dr. Griffin Basil to discuss surgical options.  Patient will call with questions or concerns in the interim.  Consent obtained and verified. Time-out conducted. Noted no overlying erythema, induration, or other signs of local infection. Skin prepped in a sterile fashion. Topical analgesic spray: Ethyl chloride. Joint: right shoulder (intra-articular) Needle: 25g 1.5 inch Completed without difficulty. Meds: 3cc 1% xylocaine, 1cc (40mg ) depomedrol  Advised to call if fevers/chills, erythema, induration, drainage, or persistent bleeding.

## 2020-03-31 ENCOUNTER — Encounter: Payer: Self-pay | Admitting: Sports Medicine

## 2020-03-31 ENCOUNTER — Ambulatory Visit (INDEPENDENT_AMBULATORY_CARE_PROVIDER_SITE_OTHER): Payer: Medicare Other | Admitting: Sports Medicine

## 2020-03-31 VITALS — BP 124/70 | Ht 62.0 in | Wt 130.0 lb

## 2020-03-31 DIAGNOSIS — M1811 Unilateral primary osteoarthritis of first carpometacarpal joint, right hand: Secondary | ICD-10-CM | POA: Diagnosis not present

## 2020-03-31 DIAGNOSIS — M1711 Unilateral primary osteoarthritis, right knee: Secondary | ICD-10-CM | POA: Diagnosis not present

## 2020-03-31 MED ORDER — METHYLPREDNISOLONE ACETATE 40 MG/ML IJ SUSP
20.0000 mg | Freq: Once | INTRAMUSCULAR | Status: AC
  Administered 2020-03-31: 20 mg via INTRA_ARTICULAR

## 2020-03-31 MED ORDER — METHYLPREDNISOLONE ACETATE 40 MG/ML IJ SUSP
40.0000 mg | Freq: Once | INTRAMUSCULAR | Status: AC
  Administered 2020-03-31: 40 mg via INTRA_ARTICULAR

## 2020-03-31 NOTE — Progress Notes (Signed)
Office Visit Note   Patient: Denise Wolfe           Date of Birth: Mar 03, 1941           MRN: [id number] Visit Date: 03/31/2020 Requested by: Lorrene Reid, PA-C [place],  [place] [address] PCP: Lorrene Reid, PA-C  Subjective: CC: Requesting R thumb and R knee Injections  HPI: Patient is a 79 year old female presenting to clinic today requesting a right thumb and right knee cortisone injection.  She was previously seen in clinic approximately 2 weeks ago, at which time a right glenohumeral injection was performed-which she states offered tremendous improvement in her day-to-day symptoms.  She continues to endorse a somewhat decreased range of motion in the shoulder when compared to the left, however she states her pain is "essentially gone."  Since she is so happy with the relief that she has gotten from the steroid injection, she would like to know if she can have the same procedure done on her right thumb, and her right knee. She states that her right thumb bothers her on a day-to-day basis, whenever she uses her hand for any repetitive activities, or simply opening or closing jars or bottles.  Her right knee also causes her significant pain throughout the day, especially when walking or trying to stay active.  She states she does her best to stay on her feet throughout the day, and by the end of the day her knee will "ache."  There are times when this aching is bad enough to keep her awake at night.  Pain in her knee is worse on the medial aspect, and beneath her kneecap.  Many years ago she had a Synvisc injection to this knee, which offered years of significant improvement.  She denies any recent trauma to her hand or to her knee, though she does have a remote history of ACL injury.              ROS:   All other systems were reviewed and are negative.  Objective: Vital Signs: BP 124/70   Ht 5\' 2"  (1.575 m)   Wt 130 lb (59 kg)   BMI 23.78 kg/m   Physical  Exam:  General:  Alert and oriented, in no acute distress. Pulm:  Breathing unlabored. Psy:  Normal mood, congruent affect. Skin: Right hand and right knee with no swelling, bruising, rashes. Musculoskeletal: Right hand: Right thumb with tenderness to palpation to the [facility] joint.  Overlying skin intact.  No laxity with varus or valgus stress across the thumb.  PIP and DIP joints with full range of motion with no tenderness, or bony deformity appreciated. Brisk capillary refill, and distal sensation intact. Right knee: Normal gait, bilateral knees with no varus or valgus deformity appreciated. Right knee with no appreciable effusion. Seated exam: Significant patellar crepitus with knee flexion and extension. Full range of motion with knee flexion and extension, though patient does endorse pain with extremes of flexion. Tenderness to palpation with both medial and lateral joint lines, though medial is far more significant. Supine exam: Tenderness to palpation with patellar compression, and with palpation of patellar facets. Ligamentous exam: No pain or laxity with anterior posterior drawer. No pain or laxity with varus or valgus stress across the knee. Meniscal testing : McMurray's negative   Imaging: X-ray results from previous encounter reviewed with patient, revealing significant degenerative changes in right [facility] joint, as well as in the medial compartment and subpatellar compartment of right  knee.   Assessment & Plan: 79 year old female presenting to clinic today requesting a right CMC injection and a right knee cortisone injection.  X-rays reviewed with patient, which demonstrated degenerative changes of the affected areas.  Risks and benefits of cortisone injections discussed, and patient opted to proceed. -Injections performed as discussed below, Which patient tolerated very well. -Strict return precautions were discussed. -If patient fails to notice adequate improvement after  cortisone injection of right knee, she can return to clinic at which time viscosupplementation could be considered, pending insurance approval. -Patient had no further questions or concerns at the completion of today's visit.     Procedures: Right CMC Cortisone Injection:  Risks and benefits or procedure discussed, Patient opted to proceed. Written Consent obtained.  Timeout performed.  Skin prepped in a sterile fashion with three alcohol wipes.  Right CMC was injected with 20mg  Methylprednisolone and 0.5cc 1% Lidocaine without epinephrine, using a 27G, 0.75in needle under Ultrasound guidance.   Right Knee Cortisone Injection:  Timeout performed.  Skin prepped in a sterile fashion with betadine before further cleansing with alcohol. Ethyl Chloride was used for topical analgesia.  Right knee was injected with 40mg  methylprednisolone and 3cc 1% Lidocaine without epinephrine via the flexed lateral approach using a 25G, 1.5in needle.   Patient tolerated both injections very well, with no immediate complications. Aftercare instructions were discussed, and patient was given strict return precautions.   Patient seen and evaluated with the sports medicine fellow.  I agree with the above plan of care.  Cortisone injections performed as above.  Patient will follow up as needed.

## 2020-05-11 ENCOUNTER — Other Ambulatory Visit: Payer: Self-pay | Admitting: Physician Assistant

## 2020-05-11 DIAGNOSIS — F32A Depression, unspecified: Secondary | ICD-10-CM

## 2020-05-11 DIAGNOSIS — I10 Essential (primary) hypertension: Secondary | ICD-10-CM

## 2020-05-11 DIAGNOSIS — F430 Acute stress reaction: Secondary | ICD-10-CM

## 2020-05-11 DIAGNOSIS — E782 Mixed hyperlipidemia: Secondary | ICD-10-CM

## 2020-05-11 DIAGNOSIS — F341 Dysthymic disorder: Secondary | ICD-10-CM

## 2020-05-18 ENCOUNTER — Other Ambulatory Visit: Payer: Self-pay

## 2020-05-18 ENCOUNTER — Other Ambulatory Visit: Payer: Self-pay | Admitting: Physician Assistant

## 2020-05-18 ENCOUNTER — Other Ambulatory Visit: Payer: Medicare Other

## 2020-05-18 DIAGNOSIS — E782 Mixed hyperlipidemia: Secondary | ICD-10-CM

## 2020-05-18 DIAGNOSIS — I10 Essential (primary) hypertension: Secondary | ICD-10-CM

## 2020-05-19 LAB — COMPREHENSIVE METABOLIC PANEL
ALT: 12 IU/L (ref 0–32)
AST: 15 IU/L (ref 0–40)
Albumin/Globulin Ratio: 2 (ref 1.2–2.2)
Albumin: 4.3 g/dL (ref 3.7–4.7)
Alkaline Phosphatase: 43 IU/L — ABNORMAL LOW (ref 44–121)
BUN/Creatinine Ratio: 26 (ref 12–28)
BUN: 19 mg/dL (ref 8–27)
Bilirubin Total: 0.4 mg/dL (ref 0.0–1.2)
CO2: 27 mmol/L (ref 20–29)
Calcium: 9.6 mg/dL (ref 8.7–10.3)
Chloride: 102 mmol/L (ref 96–106)
Creatinine, Ser: 0.74 mg/dL (ref 0.57–1.00)
GFR calc Af Amer: 90 mL/min/{1.73_m2} (ref >59)
GFR calc non Af Amer: 78 mL/min/{1.73_m2} (ref >59)
Globulin, Total: 2.2 g/dL (ref 1.5–4.5)
Glucose: 86 mg/dL (ref 65–99)
Potassium: 4.2 mmol/L (ref 3.5–5.2)
Sodium: 140 mmol/L (ref 134–144)
Total Protein: 6.5 g/dL (ref 6.0–8.5)

## 2020-05-19 LAB — CBC
Hematocrit: 40 % (ref 34.0–46.6)
Hemoglobin: 13.2 g/dL (ref 11.1–15.9)
MCH: 31.1 pg (ref 26.6–33.0)
MCHC: 33 g/dL (ref 31.5–35.7)
MCV: 94 fL (ref 79–97)
Platelets: 306 10*3/uL (ref 150–450)
RBC: 4.25 x10E6/uL (ref 3.77–5.28)
RDW: 12.8 % (ref 11.7–15.4)
WBC: 3 10*3/uL — ABNORMAL LOW (ref 3.4–10.8)

## 2020-05-19 LAB — LIPID PANEL
Chol/HDL Ratio: 2.2 ratio (ref 0.0–4.4)
Cholesterol, Total: 192 mg/dL (ref 100–199)
HDL: 88 mg/dL (ref >39)
LDL Chol Calc (NIH): 94 mg/dL (ref 0–99)
Triglycerides: 54 mg/dL (ref 0–149)
VLDL Cholesterol Cal: 10 mg/dL (ref 5–40)

## 2020-05-25 ENCOUNTER — Encounter: Payer: Self-pay | Admitting: Physician Assistant

## 2020-05-25 ENCOUNTER — Ambulatory Visit (INDEPENDENT_AMBULATORY_CARE_PROVIDER_SITE_OTHER): Payer: Medicare Other | Admitting: Physician Assistant

## 2020-05-25 ENCOUNTER — Other Ambulatory Visit: Payer: Self-pay

## 2020-05-25 VITALS — BP 140/83 | HR 89 | Temp 97.3°F | Ht 62.0 in | Wt 134.0 lb

## 2020-05-25 DIAGNOSIS — E782 Mixed hyperlipidemia: Secondary | ICD-10-CM

## 2020-05-25 DIAGNOSIS — D701 Agranulocytosis secondary to cancer chemotherapy: Secondary | ICD-10-CM | POA: Diagnosis not present

## 2020-05-25 DIAGNOSIS — I10 Essential (primary) hypertension: Secondary | ICD-10-CM | POA: Diagnosis not present

## 2020-05-25 DIAGNOSIS — F988 Other specified behavioral and emotional disorders with onset usually occurring in childhood and adolescence: Secondary | ICD-10-CM | POA: Diagnosis not present

## 2020-05-25 DIAGNOSIS — T451X5A Adverse effect of antineoplastic and immunosuppressive drugs, initial encounter: Secondary | ICD-10-CM | POA: Diagnosis not present

## 2020-05-25 DIAGNOSIS — J3089 Other allergic rhinitis: Secondary | ICD-10-CM | POA: Diagnosis not present

## 2020-05-25 MED ORDER — FLUTICASONE PROPIONATE 50 MCG/ACT NA SUSP: NASAL | 5 refills | Status: DC

## 2020-05-25 MED ORDER — AMPHETAMINE-DEXTROAMPHETAMINE 10 MG PO TABS: 10.0000 mg | ORAL_TABLET | ORAL | 0 refills | Status: DC

## 2020-05-25 NOTE — Progress Notes (Signed)
Telehealth office visit note for Denise Reid, PA-C- at Primary Care at [facility]   I connected with current patient today by telephone and verified that I am speaking with the correct person   . Location of the patient: Home . Location of the provider: Office - This visit type was conducted due to national recommendations for restrictions regarding the COVID-19 Pandemic (e.g. social distancing) in an effort to limit this patient's exposure and mitigate transmission in our community.    - No physical exam could be performed with this format, beyond that communicated to [place] by the patient/ family members as noted.   - Additionally my office staff/ schedulers were to discuss with the patient that there may be a monetary charge related to this service, depending on their medical insurance.  My understanding is that patient understood and consented to proceed.     _________________________________________________________________________________   History of Present Illness: Patient comes in to follow-up on hypertension, hyperlipidemia and ADD management.  Has no acute concerns today.  HTN: Pt denies chest pain, palpitations, dizziness or lower extremity swelling. Taking medication as directed without side effects. Checks BP at home and readings usually range 130s/over 80 and sometimes are in 140s/80s which patient attributes to stress.  States this morning her blood pressure machine did not want to work which created some stress and when she finally was able to fix it she took her blood pressure and was 147/87, she rechecked it later on and came down to 140/83.  Pt follows a low salt diet.  HLD: Pt taking medication as directed without issues. Denies side effects.  Reports she has a healthy diet with no fried foods or meat.  Also requesting refill of Flonase which helps with nasal congestion related to seasonal allergies.  Chemotherapy induced neutropenia: Patient reports she has not  follow-up with oncology in over a year.  She initially started with: [facility] but was unsatisfied with care and got a second opinion at [facility].  ADD: Has no concerns.   GAD 7 : Generalized Anxiety Score 11/26/2019 09/09/2019 01/28/2019 12/15/2018  Nervous, Anxious, on Edge 0 0 0 0  Control/stop worrying 0 0 0 0  Worry too much - different things 0 0 0 0  Trouble relaxing 1 1 1 1   Restless 1 3 0 3  Easily annoyed or irritable 0 0 0 0  Afraid - awful might happen 0 0 0 0  Total GAD 7 Score 2 4 1 4   Anxiety Difficulty Not difficult at all Somewhat difficult Not difficult at all Somewhat difficult    Depression screen [facility] 2/9 05/25/2020 02/23/2020 11/26/2019 09/09/2019 08/13/2019  Decreased Interest 0 0 0 1 0  Down, Depressed, Hopeless 0 0 0 1 0  PHQ - 2 Score 0 0 0 2 0  Altered sleeping 2 2 3 3 1   Tired, decreased energy 0 1 1 1 1   Change in appetite 0 0 0 0 0  Feeling bad or failure about yourself  0 0 0 0 0  Trouble concentrating 1 2 3 3 1   Moving slowly or fidgety/restless 0 0 0 0 0  Suicidal thoughts 0 0 0 0 0  PHQ-9 Score 3 5 7 9 3   Difficult doing work/chores Not difficult at all Somewhat difficult Somewhat difficult Somewhat difficult Not difficult at all  Some recent data might be hidden      Impression and Recommendations:     1. HTN (hypertension), benign  2. Mixed hyperlipidemia   3. Attention deficit disorder, unspecified hyperactivity presence   4. Chemotherapy induced neutropenia (HCC)   5. Environmental and seasonal allergies    Hypertension: -BP mildly elevated today, stable last OV -Recommend to continue ambulatory BP monitoring and notify the office if BP consistently >140/90 for medication adjustments. -Continue current medication regimen. -Follow a low sodium diet. -Recent CMP wnl -Will continue to monitor.  Mixed hyperlipidemia: -Most recent lipid panel and hepatic function wnl -Continue current medication regimen. -Follow a heart healthy  diet. -Will continue to monitor.  ADD: -Stable -Continue current medication regimen. PDMP reviewed, no aberrancies noted. Provided 90 day refill. -Will continue to monitor.  Chemotherapy-induced neutropenia: -Most recent cbc reveals decreased WBC from prior -Recommend to follow up with Oncology.  Environmental and seasonal allergies: -Stable. -Provided refill of Flonase spray.   - As part of my medical decision making, I reviewed the following data within the [place] History obtained from pt /family, CMA notes reviewed and incorporated if applicable, Labs reviewed, Radiograph/ tests reviewed if applicable and OV notes from prior OV's with me, as well as any other specialists she/he has seen since seeing me last, were all reviewed and used in my medical decision making process today.    - Additionally, when appropriate, discussion had with patient regarding our treatment plan, and their biases/concerns about that plan were used in my medical decision making today.    - The patient agreed with the plan and demonstrated an understanding of the instructions.   No barriers to understanding were identified.     - The patient was advised to call back or seek an in-person evaluation if the symptoms worsen or if the condition fails to improve as anticipated.   Return in about 3 months (around 08/25/2020) for ADD, HTN.    No orders of the defined types were placed in this encounter.   Meds ordered this encounter  Medications  . amphetamine-dextroamphetamine (ADDERALL) 10 MG tablet    Sig: Take 1 tablet (10 mg total) by mouth every morning.    Dispense:  90 tablet    Refill:  0    Order Specific Question:   Supervising Provider    Answer:   Beatrice Lecher D [[id number]]  . fluticasone (FLONASE) 50 MCG/ACT nasal spray    Sig: 1 spray each nostril following sinus rinses twice daily    Dispense:  16 g    Refill:  5    Order Specific Question:   Supervising Provider     Answer:   Beatrice Lecher D [[id number]]    Medications Discontinued During This Encounter  Medication Reason  . amphetamine-dextroamphetamine (ADDERALL) 10 MG tablet Reorder  . fluticasone (FLONASE) 50 MCG/ACT nasal spray Reorder       Time spent on visit including pre-visit chart review and post-visit care was 18 minutes.      The [place] was signed into law in 2016 which includes the topic of electronic health records.  This provides immediate access to information in MyChart.  This includes consultation notes, operative notes, office notes, lab results and pathology reports.  If you have any questions about what you read please let us know at your next visit or call us at the office.  We are right here with you.  Note:  This note was prepared with assistance of Dragon voice recognition software. Occasional wrong-word or sound-a-like substitutions may have occurred due to the inherent limitations of voice recognition software.  __________________________________________________________________________________     Patient Care Team    Relationship Specialty Notifications Start End  Denise Wolfe, [place] PCP - General   12/13/19   Silverio Decamp, MD Consulting Physician Family Medicine  07/25/16    Comment: OA - shoulder injeciton  Truitt Merle, MD Consulting Physician Hematology  79/89/21   Leighton Ruff, MD Consulting Physician General Surgery  07/23/17      -Vitals obtained; medications/ allergies reconciled;  personal medical, social, Sx etc.histories were updated by CMA, reviewed by me and are reflected in chart   Patient Active Problem List   Diagnosis Date Noted  . Dizziness on standing 08/13/2019  . B12 deficiency 08/13/2019  . Personal history of radiation therapy 09/30/2018  . Hx antineoplastic chemo 09/30/2018  . Anemia due to antineoplastic chemotherapy 05/14/2017  . Pneumonia of left lower lobe due to infectious organism 05/09/2017  .  Difficulty sleeping 05/09/2017  . Burning sensation- scalp esp at night 05/09/2017  . Wheeze 05/09/2017  . Chemotherapy induced neutropenia ([place]) 04/14/2017  . Squamous cell carcinoma of anal canal ([place]) 04/11/2017  . Squamous cell carcinoma of rectum ([place]) 02/14/2017  . Colon cancer ([place]) 02/11/2017  . Attention deficit disorder 12/27/2016  . Multiple atypical nevi 12/27/2016  . Nonspecific chest pain 08/08/2016  . 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 Vitamin D insufficiency 07/19/2016  . HLD (hyperlipidemia) 07/19/2016  . Retrosternal chest pain 07/19/2016  . Degenerative joint disease (DJD) of sternoclavicular joint, right 07/19/2016  . Elevated HDL:  >er 90 07/19/2016  . History of breast Cancer 05/10/2016  . Depression, recurrent ([place]) 05/10/2016  . Cataract 05/10/2016  . Pain in joint, shoulder region 05/10/2016  . HTN (hypertension), benign 05/10/2016  . Breast cancer ([place]) 03/13/2000     Current Meds  Medication Sig  . amphetamine-dextroamphetamine (ADDERALL) 10 MG tablet Take 1 tablet (10 mg total) by mouth every morning.  Marland Kitchen atorvastatin (LIPITOR) 80 MG tablet TAKE ONE-HALF TABLET BY  MOUTH DAILY  . cholecalciferol (VITAMIN D) 400 units TABS tablet Take 400 Units by mouth daily. Patient is not sure of the units.  . fluticasone (FLONASE) 50 MCG/ACT nasal spray 1 spray each nostril following sinus rinses twice daily  . losartan-hydrochlorothiazide (HYZAAR) 50-12.5 MG tablet TAKE 1 TABLET BY MOUTH  DAILY  . sertraline (ZOLOFT) 50 MG tablet Take 1.5 tablets (75 mg total) by mouth daily.  . vitamin B-12 (CYANOCOBALAMIN) 500 MCG tablet Take 1 tablet (500 mcg total) by mouth daily.  . [DISCONTINUED] amphetamine-dextroamphetamine (ADDERALL) 10 MG tablet Take 1 tablet (10 mg total) by mouth every morning.  . [DISCONTINUED] fluticasone (FLONASE) 50 MCG/ACT nasal spray 1 spray each nostril following sinus rinses twice daily   Current Facility-Administered Medications  for the 05/25/20 encounter (Office Visit) with Denise Reid, PA-C  Medication  . 0.9 %  sodium chloride infusion     Allergies:  Allergies  Allergen Reactions  . Ibuprofen Rash     ROS:  See above HPI for pertinent positives and negatives   Objective:   Blood pressure 140/83, pulse 89, temperature (!) 97.3 F (36.3 C), height 5\' 2"  (1.575 m), weight 134 lb (60.8 kg).  (if some vitals are omitted, this means that patient was UNABLE to obtain them even though they were asked to get them prior to OV today.  They were asked to call us at their earliest convenience with these once obtained. ) General: A & O * 3; sounds in no acute distress;  Respiratory: speaking  in full sentences, no conversational dyspnea Psych: insight appears good, mood- appears full

## 2020-06-14 ENCOUNTER — Other Ambulatory Visit: Payer: Self-pay | Admitting: Physician Assistant

## 2020-06-14 DIAGNOSIS — I10 Essential (primary) hypertension: Secondary | ICD-10-CM

## 2020-06-16 ENCOUNTER — Telehealth: Payer: Self-pay | Admitting: Physician Assistant

## 2020-06-16 DIAGNOSIS — I10 Essential (primary) hypertension: Secondary | ICD-10-CM

## 2020-06-16 MED ORDER — LOSARTAN POTASSIUM-HCTZ 50-12.5 MG PO TABS: 1.0000 | ORAL_TABLET | Freq: Every day | ORAL | 0 refills | Status: DC

## 2020-06-16 NOTE — Addendum Note (Signed)
Addended by: Mickel Crow on: 06/16/2020 10:27 AM   Modules accepted: Orders

## 2020-06-16 NOTE — Telephone Encounter (Signed)
Patient needs a refill on losartan. She uses [facility]. Thanks

## 2020-06-16 NOTE — Telephone Encounter (Signed)
Refill sent to requested pharmacy. AS, CMA 

## 2020-07-25 ENCOUNTER — Ambulatory Visit (INDEPENDENT_AMBULATORY_CARE_PROVIDER_SITE_OTHER): Payer: Medicare Other | Admitting: Internal Medicine

## 2020-07-25 ENCOUNTER — Other Ambulatory Visit: Payer: Self-pay

## 2020-07-25 ENCOUNTER — Encounter: Payer: Self-pay | Admitting: Internal Medicine

## 2020-07-25 VITALS — BP 120/62 | HR 70 | Ht 62.0 in | Wt 138.0 lb

## 2020-07-25 DIAGNOSIS — R42 Dizziness and giddiness: Secondary | ICD-10-CM | POA: Diagnosis not present

## 2020-07-25 DIAGNOSIS — I951 Orthostatic hypotension: Secondary | ICD-10-CM

## 2020-07-25 DIAGNOSIS — I10 Essential (primary) hypertension: Secondary | ICD-10-CM | POA: Diagnosis not present

## 2020-07-25 DIAGNOSIS — E782 Mixed hyperlipidemia: Secondary | ICD-10-CM

## 2020-07-25 NOTE — Patient Instructions (Signed)
Medication Instructions:  No Changes In Medications at this time.  *If you need a refill on your cardiac medications before your next appointment, please call your pharmacy*  Follow-Up: At [facility], you and your health needs are our priority.  As part of our continuing mission to provide you with exceptional heart care, we have created designated Provider Care Teams.  These Care Teams include your primary Cardiologist (physician) and Advanced Practice Providers (APPs -  Physician Assistants and Nurse Practitioners) who all work together to provide you with the care you need, when you need it.  Your next appointment:   1 year(s)  The format for your next appointment:   In Person  Provider:   Cherlynn Kaiser, MD  Other Instructions PLEASE [place].

## 2020-07-25 NOTE — Progress Notes (Signed)
Cardiology Office Note:    Date:  07/25/2020   ID:  Denise Wolfe, DOB 1941-02-06, MRN [id number]  PCP:  Lorrene Reid, PA-C  Cardiologist:  No primary care provider on file.  Electrophysiologist:  None   Referring MD: Lorrene Reid, PA-C   Chief Complaint/Reason for Referral: Follow up orthostatic hypotension, dizziness  History of Present Illness:    Denise Wolfe is a 79 y.o. female with a history of breast cancer with chemotherapy and radiation, colon cancer, depression, hypertension who presents for follow-up.   Occasional continued dizziness, 2-3 x a week. Feels unsteady but has not fallen. We discussed compression stockings despite no LE swelling.   The patient denies chest pain, chest pressure, dyspnea at rest or with exertion, palpitations, PND, orthopnea, or leg swelling. Denies cough, fever, chills. Denies nausea, vomiting. Denies syncope.   Past Medical History:  Diagnosis Date  . Anemia    during childhood  . Arthritis    hands, shoulder  . Breast cancer ([place]) 03/2000   left side modified mastecomy  . Cancer [facility])    breast, age 71; left side  . Colon cancer ([place]) 02/11/2017   rectum-squamous cell carcinoma  . Depression   . Hypertension   . Personal history of chemotherapy   . Personal history of radiation therapy   . Squamous cell carcinoma of rectum ([place]) 02/14/2017    Past Surgical History:  Procedure Laterality Date  . ABDOMINAL HYSTERECTOMY     With bilateral oophorectomy. 2002.  Marland Kitchen BREAST BIOPSY Right 2004   benign  . BREAST SURGERY Left 2001   biopsy  . CATARACT EXTRACTION Bilateral 2017  . IR FLUORO GUIDE CV LINE RIGHT  03/11/2017  . IR FLUORO GUIDE CV LINE RIGHT  04/16/2017  . IR US GUIDE VASC ACCESS RIGHT  03/11/2017  . IR US GUIDE VASC ACCESS RIGHT  04/16/2017  . MASTECTOMY MODIFIED RADICAL Left 2001  . MENISCUS DEBRIDEMENT Right 2003  . TUMOR EXCISION Right    thigh area    Current Medications: Current Meds  Medication Sig  .  amphetamine-dextroamphetamine (ADDERALL) 10 MG tablet Take 1 tablet (10 mg total) by mouth every morning.  Marland Kitchen atorvastatin (LIPITOR) 80 MG tablet TAKE ONE-HALF TABLET BY  MOUTH DAILY  . cholecalciferol (VITAMIN D) 400 units TABS tablet Take 400 Units by mouth daily. Patient is not sure of the units.  . fluticasone (FLONASE) 50 MCG/ACT nasal spray 1 spray each nostril following sinus rinses twice daily  . losartan-hydrochlorothiazide (HYZAAR) 50-12.5 MG tablet Take 1 tablet by mouth daily.  . sertraline (ZOLOFT) 50 MG tablet Take 1.5 tablets (75 mg total) by mouth daily.  . vitamin B-12 (CYANOCOBALAMIN) 500 MCG tablet Take 1 tablet (500 mcg total) by mouth daily.   Current Facility-Administered Medications for the 07/25/20 encounter (Office Visit) with Elouise Munroe, MD  Medication  . 0.9 %  sodium chloride infusion     Allergies:   Ibuprofen   Social History   Tobacco Use  . Smoking status: Never Smoker  . Smokeless tobacco: Never Used  Substance Use Topics  . Alcohol use: Yes    Alcohol/week: 14.0 standard drinks    Types: 14 Glasses of wine per week  . Drug use: No     Family History: The patient's family history includes Breast cancer in her mother; Cancer (age of onset: 63) in her mother; Heart disease in her father. There is no history of Colon cancer, Esophageal cancer, Rectal cancer, or Stomach cancer.  ROS:  Please see the history of present illness.    All other systems reviewed and are negative.  EKGs/Labs/Other Studies Reviewed:    The following studies were reviewed today:  EKG:  NSR, rate 70  Recent Labs: 08/11/2019: Magnesium 2.1; TSH 2.470 05/18/2020: ALT 12; BUN 19; Creatinine, Ser 0.74; Hemoglobin 13.2; Platelets 306; Potassium 4.2; Sodium 140  Recent Lipid Panel    Component Value Date/Time   CHOL 192 05/18/2020 1407   TRIG 54 05/18/2020 1407   HDL 88 05/18/2020 1407   CHOLHDL 2.2 05/18/2020 1407   CHOLHDL 2.9 07/11/2016 0923   VLDL 10  07/11/2016 0923   LDLCALC 94 05/18/2020 1407    Physical Exam:    VS:  BP 120/62   Pulse 70   Ht 5\' 2"  (1.575 m)   Wt 138 lb (62.6 kg)   SpO2 94%   BMI 25.24 kg/m     Wt Readings from Last 5 Encounters:  07/25/20 138 lb (62.6 kg)  05/25/20 134 lb (60.8 kg)  03/31/20 130 lb (59 kg)  03/17/20 133 lb (60.3 kg)  02/23/20 133 lb 3.2 oz (60.4 kg)    Constitutional: No acute distress Eyes: sclera non-icteric, normal conjunctiva and lids ENMT: normal dentition, moist mucous membranes Cardiovascular: regular rhythm, normal rate, no murmurs. S1 and S2 normal. Radial pulses normal bilaterally. No jugular venous distention.  Respiratory: clear to auscultation bilaterally GI : normal bowel sounds, soft and nontender. No distention.   MSK: extremities warm, well perfused. No edema.  NEURO: grossly nonfocal exam, moves all extremities. PSYCH: alert and oriented x 3, normal mood and affect.   ASSESSMENT:    1. Dizziness   2. Dizziness on standing   3. Orthostatic hypotension   4. HTN (hypertension), benign   5. Mixed hyperlipidemia    PLAN:    Dizziness - Plan: EKG 12-Lead Dizziness on standing Orthostatic hypotension  - continues to occur, no worrisome findings on echo. Recommend compression stockings and if continued symptoms can consider further testing.   HTN (hypertension), benign - continue losartan HCTZ  HLD - continue atorvastatin 40 mg daily. LDL is 94, continue diet and lifestyle modifications. Consider uptitration of statin if tolerated for goal LDL <70.   Cherlynn Kaiser, MD [place]  [facility]    Medication Adjustments/Labs and Tests Ordered: Current medicines are reviewed at length with the patient today.  Concerns regarding medicines are outlined above.   Orders Placed This Encounter  Procedures  . EKG 12-Lead   No orders of the defined types were placed in this encounter.   Patient Instructions  Medication Instructions:  No Changes In  Medications at this time.  *If you need a refill on your cardiac medications before your next appointment, please call your pharmacy*  Follow-Up: At [facility], you and your health needs are our priority.  As part of our continuing mission to provide you with exceptional heart care, we have created designated Provider Care Teams.  These Care Teams include your primary Cardiologist (physician) and Advanced Practice Providers (APPs -  Physician Assistants and Nurse Practitioners) who all work together to provide you with the care you need, when you need it.  Your next appointment:   1 year(s)  The format for your next appointment:   In Person  Provider:   Cherlynn Kaiser, MD  Other Instructions PLEASE [place].

## 2020-08-15 ENCOUNTER — Other Ambulatory Visit: Payer: Self-pay | Admitting: Physician Assistant

## 2020-08-15 ENCOUNTER — Telehealth: Payer: Self-pay | Admitting: Physician Assistant

## 2020-08-15 DIAGNOSIS — E782 Mixed hyperlipidemia: Secondary | ICD-10-CM

## 2020-08-15 NOTE — Telephone Encounter (Signed)
Please contact patient to schedule apt per last AVS for med refills. AS, CMA

## 2020-08-16 NOTE — Telephone Encounter (Signed)
Left voicemail on 08-16-20.  

## 2020-08-22 ENCOUNTER — Other Ambulatory Visit: Payer: Self-pay | Admitting: Physician Assistant

## 2020-08-22 ENCOUNTER — Telehealth: Payer: Self-pay | Admitting: Physician Assistant

## 2020-08-22 ENCOUNTER — Encounter: Payer: Self-pay | Admitting: Physician Assistant

## 2020-08-22 ENCOUNTER — Other Ambulatory Visit: Payer: Self-pay

## 2020-08-22 ENCOUNTER — Ambulatory Visit (INDEPENDENT_AMBULATORY_CARE_PROVIDER_SITE_OTHER): Payer: Medicare Other | Admitting: Physician Assistant

## 2020-08-22 VITALS — BP 112/72 | HR 98 | Ht 62.0 in | Wt 139.4 lb

## 2020-08-22 DIAGNOSIS — F988 Other specified behavioral and emotional disorders with onset usually occurring in childhood and adolescence: Secondary | ICD-10-CM

## 2020-08-22 DIAGNOSIS — F430 Acute stress reaction: Secondary | ICD-10-CM

## 2020-08-22 DIAGNOSIS — F339 Major depressive disorder, recurrent, unspecified: Secondary | ICD-10-CM | POA: Diagnosis not present

## 2020-08-22 DIAGNOSIS — I10 Essential (primary) hypertension: Secondary | ICD-10-CM | POA: Diagnosis not present

## 2020-08-22 MED ORDER — SERTRALINE HCL 50 MG PO TABS: 75.0000 mg | ORAL_TABLET | Freq: Every day | ORAL | 1 refills | Status: DC

## 2020-08-22 MED ORDER — AMPHETAMINE-DEXTROAMPHETAMINE 10 MG PO TABS: 10.0000 mg | ORAL_TABLET | ORAL | 0 refills | Status: DC

## 2020-08-22 NOTE — Telephone Encounter (Signed)
Patient seen today. AS, CMA

## 2020-08-22 NOTE — Patient Instructions (Signed)

## 2020-08-22 NOTE — Telephone Encounter (Signed)
Patient requested her sertraline be sent to [facility] instead of [facility], thanks.

## 2020-08-22 NOTE — Addendum Note (Signed)
Addended by: Mickel Crow on: 08/22/2020 02:05 PM   Modules accepted: Orders

## 2020-08-22 NOTE — Progress Notes (Signed)
Established Patient Office Visit  Subjective:  Patient ID: Denise Wolfe, female    DOB: 01-Apr-1941  Age: 80 y.o. MRN: [id number]  CC:  Chief Complaint  Patient presents with  . Hypertension    HPI [name] presents for follow up on hypertension and ADD management. Reports has started having right shoulder pain again and is planning to contact orthopedics for possible steroid injection again.  HTN: Pt denies chest pain, palpitations, dizziness or lower extremity swelling. Taking medication as directed without side effects. Has not been checking BP at home lately. Pt follows a low salt diet. Tries to stay hydrated but verbalizes could do better.  ADD: Reports medication compliance. Medication helps with focusing and is able to get done what she needs to. Has been on Adderall for many years.  Mood: Feels like mood is doing fine. Taking Sertraline 75 mg without issues. Denies SI/HI.  Past Medical History:  Diagnosis Date  . Anemia    during childhood  . Arthritis    hands, shoulder  . Breast cancer ([place]) 03/2000   left side modified mastecomy  . Cancer [facility])    breast, age 9; left side  . Colon cancer ([place]) 02/11/2017   rectum-squamous cell carcinoma  . Depression   . Hypertension   . Personal history of chemotherapy   . Personal history of radiation therapy   . Squamous cell carcinoma of rectum ([place]) 02/14/2017    Past Surgical History:  Procedure Laterality Date  . ABDOMINAL HYSTERECTOMY     With bilateral oophorectomy. 2002.  Marland Kitchen BREAST BIOPSY Right 2004   benign  . BREAST SURGERY Left 2001   biopsy  . CATARACT EXTRACTION Bilateral 2017  . IR FLUORO GUIDE CV LINE RIGHT  03/11/2017  . IR FLUORO GUIDE CV LINE RIGHT  04/16/2017  . IR US GUIDE VASC ACCESS RIGHT  03/11/2017  . IR US GUIDE VASC ACCESS RIGHT  04/16/2017  . MASTECTOMY MODIFIED RADICAL Left 2001  . MENISCUS DEBRIDEMENT Right 2003  . TUMOR EXCISION Right    thigh area    Family History  Problem Relation  Age of Onset  . Cancer Mother 68       breast  . Breast cancer Mother   . Heart disease Father   . Colon cancer Neg Hx   . Esophageal cancer Neg Hx   . Rectal cancer Neg Hx   . Stomach cancer Neg Hx     Social History   Socioeconomic History  . Marital status: Widowed    Spouse name: Not on file  . Number of children: Not on file  . Years of education: Not on file  . Highest education level: Not on file  Occupational History  . Not on file  Tobacco Use  . Smoking status: Never Smoker  . Smokeless tobacco: Never Used  Substance and Sexual Activity  . Alcohol use: Yes    Alcohol/week: 14.0 standard drinks    Types: 14 Glasses of wine per week  . Drug use: No  . Sexual activity: Not Currently  Other Topics Concern  . Not on file  Social History Narrative  . Not on file   Social Determinants of Health   Financial Resource Strain: Not on file  Food Insecurity: Not on file  Transportation Needs: Not on file  Physical Activity: Not on file  Stress: Not on file  Social Connections: Not on file  Intimate Partner Violence: Not on file    Outpatient Medications Prior to Visit  Medication Sig Dispense Refill  . atorvastatin (LIPITOR) 80 MG tablet Take 0.5 tablets (40 mg total) by mouth daily. **NEEDS APT FOR REFILLS** 30 tablet 0  . cholecalciferol (VITAMIN D) 400 units TABS tablet Take 400 Units by mouth daily. Patient is not sure of the units.    . fluticasone (FLONASE) 50 MCG/ACT nasal spray 1 spray each nostril following sinus rinses twice daily 16 g 5  . losartan-hydrochlorothiazide (HYZAAR) 50-12.5 MG tablet Take 1 tablet by mouth daily. 90 tablet 0  . vitamin B-12 (CYANOCOBALAMIN) 500 MCG tablet Take 1 tablet (500 mcg total) by mouth daily.    Marland Kitchen amphetamine-dextroamphetamine (ADDERALL) 10 MG tablet Take 1 tablet (10 mg total) by mouth every morning. 90 tablet 0  . sertraline (ZOLOFT) 50 MG tablet Take 1.5 tablets (75 mg total) by mouth daily. 135 tablet 0    Facility-Administered Medications Prior to Visit  Medication Dose Route Frequency Provider Last Rate Last Admin  . 0.9 %  sodium chloride infusion  500 mL Intravenous Continuous [name], MD        Allergies  Allergen Reactions  . Ibuprofen Rash    ROS Review of Systems A fourteen system review of systems was performed and found to be positive as per HPI.  Objective:    Physical Exam General:  Well Developed, well nourished, in no acute distress  Neuro:  Alert and oriented,  extra-ocular muscles intact  HEENT:  Normocephalic, atraumatic, neck supple Skin:  no gross rash, warm, pink. Cardiac:  RRR, S1 S2 wml's Respiratory:  ECTA B/L and A/P, Not using accessory muscles, speaking in full sentences- unlabored. Vascular:  Ext warm, no cyanosis apprec.; no gross edema Psych:  No HI/SI, judgement and insight good, Euthymic mood. Full Affect.  BP 112/72   Pulse 98   Ht $R'5\' 2"'xG$  (1.575 m)   Wt 139 lb 6.4 oz (63.2 kg)   SpO2 99%   BMI 25.50 kg/m  Wt Readings from Last 3 Encounters:  08/22/20 139 lb 6.4 oz (63.2 kg)  07/25/20 138 lb (62.6 kg)  05/25/20 134 lb (60.8 kg)     Health Maintenance Due  Topic Date Due  . Hepatitis C Screening  Never done  . COVID-19 Vaccine (1) Never done  . TETANUS/TDAP  05/17/2014  . INFLUENZA VACCINE  03/13/2020    There are no preventive care reminders to display for this patient.  Lab Results  Component Value Date   TSH 2.470 08/11/2019   Lab Results  Component Value Date   WBC 3.0 (L) 05/18/2020   HGB 13.2 05/18/2020   HCT 40.0 05/18/2020   MCV 94 05/18/2020   PLT 306 05/18/2020   Lab Results  Component Value Date   NA 140 05/18/2020   K 4.2 05/18/2020   CHLORIDE 105 06/11/2017   CO2 27 05/18/2020   GLUCOSE 86 05/18/2020   BUN 19 05/18/2020   CREATININE 0.74 05/18/2020   BILITOT 0.4 05/18/2020   ALKPHOS 43 (L) 05/18/2020   AST 15 05/18/2020   ALT 12 05/18/2020   PROT 6.5 05/18/2020   ALBUMIN 4.3 05/18/2020    CALCIUM 9.6 05/18/2020   ANIONGAP 9 07/31/2018   EGFR >60 06/11/2017   GFR 74.49 02/15/2017   Lab Results  Component Value Date   CHOL 192 05/18/2020   Lab Results  Component Value Date   HDL 88 05/18/2020   Lab Results  Component Value Date   LDLCALC 94 05/18/2020   Lab Results  Component Value Date  TRIG 54 05/18/2020   Lab Results  Component Value Date   CHOLHDL 2.2 05/18/2020   Lab Results  Component Value Date   HGBA1C 5.1 07/11/2016      Assessment & Plan:   Problem List Items Addressed This Visit      Cardiovascular and Mediastinum   HTN (hypertension), benign - Primary (Chronic)     Other   Depression, recurrent (HCC) (Chronic)   Relevant Medications   sertraline (ZOLOFT) 50 MG tablet   Mixed disorder as reaction to stress (Chronic)   Relevant Medications   sertraline (ZOLOFT) 50 MG tablet   Attention deficit disorder (Chronic)   Relevant Medications   amphetamine-dextroamphetamine (ADDERALL) 10 MG tablet     HTN: - BP today is at goal. - Continue current medication regimen. - Continue DASH diet and increase hydration. - Encourage to stay as active as possible. - Will continue to monitor. Last CMP: renal function and electrolytes wnl  Recurrent Depression, Mixed disorder as reaction to stress: -PHQ-9 score of 3, stable. -Continue current medication regimen.  -Will continue to monitor.  ADD: -Stable. -PDMP reviewed, no aberrancies noted. Provided refill. -Will continue to monitor.     Meds ordered this encounter  Medications  . amphetamine-dextroamphetamine (ADDERALL) 10 MG tablet    Sig: Take 1 tablet (10 mg total) by mouth every morning.    Dispense:  90 tablet    Refill:  0    Order Specific Question:   Supervising Provider    Answer:   Beatrice Lecher D [[id number]]  . sertraline (ZOLOFT) 50 MG tablet    Sig: Take 1.5 tablets (75 mg total) by mouth daily.    Dispense:  135 tablet    Refill:  1    Requesting 1 year supply     Order Specific Question:   Supervising Provider    Answer:   Beatrice Lecher D [[id number]]    Follow-up: Return in about 3 months (around 11/20/2020) for [place] and [place].    Lorrene Reid, PA-C

## 2020-09-06 ENCOUNTER — Other Ambulatory Visit: Payer: Self-pay

## 2020-09-06 ENCOUNTER — Ambulatory Visit (INDEPENDENT_AMBULATORY_CARE_PROVIDER_SITE_OTHER): Payer: Medicare Other | Admitting: Sports Medicine

## 2020-09-06 VITALS — BP 128/86 | Ht 62.0 in | Wt 138.0 lb

## 2020-09-06 DIAGNOSIS — G8929 Other chronic pain: Secondary | ICD-10-CM

## 2020-09-06 DIAGNOSIS — M25511 Pain in right shoulder: Secondary | ICD-10-CM

## 2020-09-06 NOTE — Progress Notes (Signed)
Patient ID: Denise Wolfe, female   DOB: 01-Jan-1941, 80 y.o.   MRN: [id number]  Patient returns today with returning right shoulder pain.  She was last seen in the office several months ago and a glenohumeral cortisone injection provided her with several weeks of pain relief.  A few weeks ago her pain began to return.  Physical exam was not repeated.  We talked about options including repeat injection versus surgical referral.  She would like to try another injection.  She will return to the office tomorrow for an ultrasound-guided glenohumeral injection to be done by Dr. Buena Irish.  If her pain persists thereafter, then she may need to reconsider surgery.

## 2020-09-07 ENCOUNTER — Encounter: Payer: Self-pay | Admitting: Family Medicine

## 2020-09-07 ENCOUNTER — Ambulatory Visit: Payer: Self-pay

## 2020-09-07 ENCOUNTER — Ambulatory Visit (INDEPENDENT_AMBULATORY_CARE_PROVIDER_SITE_OTHER): Payer: Medicare Other | Admitting: Family Medicine

## 2020-09-07 ENCOUNTER — Other Ambulatory Visit: Payer: Self-pay

## 2020-09-07 VITALS — BP 142/84 | Ht 62.0 in | Wt 138.0 lb

## 2020-09-07 DIAGNOSIS — G8929 Other chronic pain: Secondary | ICD-10-CM

## 2020-09-07 DIAGNOSIS — M25511 Pain in right shoulder: Secondary | ICD-10-CM

## 2020-09-07 MED ORDER — METHYLPREDNISOLONE ACETATE 40 MG/ML IJ SUSP
40.0000 mg | Freq: Once | INTRAMUSCULAR | Status: AC
  Administered 2020-09-07: 40 mg via INTRA_ARTICULAR

## 2020-09-07 NOTE — Addendum Note (Signed)
Addended by: Jolinda Croak E on: 09/07/2020 02:47 PM   Modules accepted: Orders

## 2020-09-07 NOTE — Progress Notes (Addendum)
   PCP: Lorrene Reid, PA-C  Subjective:   HPI: Patient is a 80 y.o. female here for reevaluation ultrasound-guided injection of her right shoulder.  She was recently seen by Dr. Micheline Chapman yesterday for returning right shoulder pain.  Previously, she was seen in the office on 03/17/2020, at that time received a glenohumeral joint injection with benefit.  After discussion yesterday, was determined that she would return today for a repeat ultrasound-guided glenohumeral joint injection.   Review of Systems:  Per HPI.   [place], medications and smoking status reviewed.      Objective:  Physical Exam:  No flowsheet data found.   Gen: awake, alert, NAD, comfortable in exam room Pulm: breathing unlabored  Right shoulder: -Inspection: no obvious deformity, atrophy, or asymmetry. No bruising. No swelling -Palpation: Mildly tender to palpation over [facility] joint -ROM: Full ROM in abduction, flexion, internal/external rotation both passively and actively .  Pain in all planes, especially internal rotation NV intact distally Normal scapular function observed. Special Tests:  - Impingement:  Mildly positive Hawkins, Neg neers, Neg empty can sign. - Supraspinatus: Negative empty can.  5/5 strength with resisted flexion at 20 degrees - Infraspinatus/Teres Minor: 5/5 strength with ER - Subscapularis: 5/5 strength with IR     Imaging: Reviewed previous x-rays done in August 2021 which show severe glenohumeral arthritis with spurring.  Assessment & Plan:  1.  Right glenohumeral arthritis, severe Discussed options with patient and she elected to proceed with glenohumeral injection which was performed under ultrasound guidance today without complication.  Please see below for details.  We discussed that we can do this as often as every 3 months, and to follow-up as needed based on pain.  Procedure note: Risks and benefits of the procedure were discussed with the patient, and she elected to proceed,  providing both verbal and written consent.  Timeout was called prior to the procedure and right shoulder location was identified.  Patient was seated on the examination table in the upright position.  Her posterior shoulder was identified under ultrasound guidance after cleaning with alcohol swab and using sterile probe and sterile gel.  She was anesthetized with 2 cc of lidocaine, and then injected with a mixture of 40 mg of Depo-Medrol and 3 cc of 1% lidocaine into the glenohumeral joint.  This is all well visualized under ultrasound, please see associated documentation for details.   Dagoberto Ligas, MD [facility] Fellow 09/07/2020 1:43 PM  Addendum:  I was the preceptor for this visit and available for immediate consultation.  Karlton Lemon MD Kirt Boys

## 2020-09-08 ENCOUNTER — Telehealth: Payer: Self-pay | Admitting: Physician Assistant

## 2020-09-08 DIAGNOSIS — I10 Essential (primary) hypertension: Secondary | ICD-10-CM

## 2020-09-08 MED ORDER — HYDROCHLOROTHIAZIDE 12.5 MG PO CAPS: 12.5000 mg | ORAL_CAPSULE | Freq: Every day | ORAL | 1 refills | Status: DC

## 2020-09-08 MED ORDER — LOSARTAN POTASSIUM 50 MG PO TABS
50.0000 mg | ORAL_TABLET | Freq: Every day | ORAL | 1 refills | Status: DC
Start: 2020-09-08 — End: 2021-02-14

## 2020-09-08 MED ORDER — LOSARTAN POTASSIUM-HCTZ 50-12.5 MG PO TABS: 1.0000 | ORAL_TABLET | Freq: Every day | ORAL | 1 refills | Status: DC

## 2020-09-08 NOTE — Telephone Encounter (Signed)
Patient needs a refill on losartan-hydrochlorothiazide and mentioned it might have to be filled separate from one another. [facility] on [facility]. Thanks

## 2020-09-08 NOTE — Telephone Encounter (Signed)
Patient states [facility] needs to fill the losartan and hydrochlorothiazide separately as they are out of the regular one. She mentioned they have had to be filled separately before being losartan as one and hydrochlorothiazide as one.Thanks

## 2020-09-08 NOTE — Addendum Note (Signed)
Addended by: Mickel Crow on: 09/08/2020 09:58 AM   Modules accepted: Orders

## 2020-09-08 NOTE — Telephone Encounter (Signed)
Meds have been separated and sent to requested pharmacy per [facility]. AS, CMA

## 2020-09-08 NOTE — Addendum Note (Signed)
Addended by: Mickel Crow on: 09/08/2020 01:26 PM   Modules accepted: Orders

## 2020-10-17 ENCOUNTER — Other Ambulatory Visit: Payer: Self-pay | Admitting: Physician Assistant

## 2020-10-17 DIAGNOSIS — E782 Mixed hyperlipidemia: Secondary | ICD-10-CM

## 2020-11-10 ENCOUNTER — Other Ambulatory Visit: Payer: Self-pay | Admitting: Physician Assistant

## 2020-11-10 DIAGNOSIS — Z0000 Encounter for general adult medical examination without abnormal findings: Secondary | ICD-10-CM

## 2020-11-10 DIAGNOSIS — E782 Mixed hyperlipidemia: Secondary | ICD-10-CM

## 2020-11-10 DIAGNOSIS — I10 Essential (primary) hypertension: Secondary | ICD-10-CM

## 2020-11-10 DIAGNOSIS — E7889 Other lipoprotein metabolism disorders: Secondary | ICD-10-CM

## 2020-11-10 NOTE — Progress Notes (Signed)
A1C is not covered by insurance. AS, CMA

## 2020-11-17 ENCOUNTER — Other Ambulatory Visit: Payer: Self-pay

## 2020-11-17 ENCOUNTER — Other Ambulatory Visit: Payer: Medicare Other

## 2020-11-17 DIAGNOSIS — E7889 Other lipoprotein metabolism disorders: Secondary | ICD-10-CM | POA: Diagnosis not present

## 2020-11-17 DIAGNOSIS — E782 Mixed hyperlipidemia: Secondary | ICD-10-CM

## 2020-11-17 DIAGNOSIS — Z0000 Encounter for general adult medical examination without abnormal findings: Secondary | ICD-10-CM | POA: Diagnosis not present

## 2020-11-17 DIAGNOSIS — I10 Essential (primary) hypertension: Secondary | ICD-10-CM | POA: Diagnosis not present

## 2020-11-18 LAB — COMPREHENSIVE METABOLIC PANEL
ALT: 16 IU/L (ref 0–32)
AST: 16 IU/L (ref 0–40)
Albumin/Globulin Ratio: 1.9 (ref 1.2–2.2)
Albumin: 4.1 g/dL (ref 3.7–4.7)
Alkaline Phosphatase: 42 IU/L — ABNORMAL LOW (ref 44–121)
BUN/Creatinine Ratio: 22 (ref 12–28)
BUN: 17 mg/dL (ref 8–27)
Bilirubin Total: 0.3 mg/dL (ref 0.0–1.2)
CO2: 25 mmol/L (ref 20–29)
Calcium: 9.4 mg/dL (ref 8.7–10.3)
Chloride: 107 mmol/L — ABNORMAL HIGH (ref 96–106)
Creatinine, Ser: 0.79 mg/dL (ref 0.57–1.00)
Globulin, Total: 2.2 g/dL (ref 1.5–4.5)
Glucose: 101 mg/dL — ABNORMAL HIGH (ref 65–99)
Potassium: 4.6 mmol/L (ref 3.5–5.2)
Sodium: 144 mmol/L (ref 134–144)
Total Protein: 6.3 g/dL (ref 6.0–8.5)
eGFR: 77 mL/min/{1.73_m2} (ref >59)

## 2020-11-18 LAB — CBC
Hematocrit: 38.7 % (ref 34.0–46.6)
Hemoglobin: 12.9 g/dL (ref 11.1–15.9)
MCH: 30.2 pg (ref 26.6–33.0)
MCHC: 33.3 g/dL (ref 31.5–35.7)
MCV: 91 fL (ref 79–97)
Platelets: 309 10*3/uL (ref 150–450)
RBC: 4.27 x10E6/uL (ref 3.77–5.28)
RDW: 13.4 % (ref 11.7–15.4)
WBC: 3 10*3/uL — ABNORMAL LOW (ref 3.4–10.8)

## 2020-11-18 LAB — TSH: TSH: 1.95 u[IU]/mL (ref 0.450–4.500)

## 2020-11-18 LAB — LIPID PANEL
Chol/HDL Ratio: 2.4 ratio (ref 0.0–4.4)
Cholesterol, Total: 189 mg/dL (ref 100–199)
HDL: 79 mg/dL (ref >39)
LDL Chol Calc (NIH): 99 mg/dL (ref 0–99)
Triglycerides: 57 mg/dL (ref 0–149)
VLDL Cholesterol Cal: 11 mg/dL (ref 5–40)

## 2020-11-21 ENCOUNTER — Encounter: Payer: Self-pay | Admitting: Physician Assistant

## 2020-11-21 ENCOUNTER — Ambulatory Visit (INDEPENDENT_AMBULATORY_CARE_PROVIDER_SITE_OTHER): Payer: Medicare Other | Admitting: Physician Assistant

## 2020-11-21 VITALS — BP 120/77 | Temp 96.8°F | Ht 62.0 in | Wt 138.0 lb

## 2020-11-21 DIAGNOSIS — F988 Other specified behavioral and emotional disorders with onset usually occurring in childhood and adolescence: Secondary | ICD-10-CM

## 2020-11-21 DIAGNOSIS — E2839 Other primary ovarian failure: Secondary | ICD-10-CM

## 2020-11-21 DIAGNOSIS — Z780 Asymptomatic menopausal state: Secondary | ICD-10-CM | POA: Diagnosis not present

## 2020-11-21 DIAGNOSIS — Z0000 Encounter for general adult medical examination without abnormal findings: Secondary | ICD-10-CM | POA: Diagnosis not present

## 2020-11-21 MED ORDER — AMPHETAMINE-DEXTROAMPHETAMINE 10 MG PO TABS: 10.0000 mg | ORAL_TABLET | ORAL | 0 refills | Status: DC

## 2020-11-21 NOTE — Progress Notes (Signed)
Virtual Visit via Telephone Note:  I connected with [name] by telephone and verified that I am speaking with the correct person using two identifiers.    I discussed the limitations, risks, security and privacy concerns for performing an evaluation and management service by telephone and the availability of in person appointments. The staff discussed with patient that there may be a patient responsible charge related to this service. The patient expressed understanding and agreed to proceed.   Location of Patient- Home Location of Provider- Office    Subjective:   Denise Wolfe is a 80 y.o. female who presents for Medicare Annual (Subsequent) preventive examination.  Review of Systems    General:   No F/C, wt loss Pulm:   No DIB, SOB, pleuritic chest pain Card:  No CP, palpitations Abd:  No n/v/d or pain Ext:  No inc edema from baseline     Objective:    Today's Vitals   11/21/20 0958  BP: 120/77  Temp: (!) 96.8 F (36 C)  Weight: 138 lb (62.6 kg)  Height: 5\' 2"  (1.575 m)   Body mass index is 25.24 kg/m.  Advanced Directives 10/09/2019 11/28/2017 07/23/2017 05/14/2017 04/27/2017 04/16/2017 03/25/2017  Does Patient Have a Medical Advance Directive? No No No No Yes No No  Would patient like information on creating a medical advance directive? No - Patient declined - - - No - Patient declined No - Patient declined -    Current Medications (verified) Outpatient Encounter Medications as of 11/21/2020  Medication Sig  . amphetamine-dextroamphetamine (ADDERALL) 10 MG tablet Take 1 tablet (10 mg total) by mouth every morning.  Marland Kitchen atorvastatin (LIPITOR) 80 MG tablet Take 0.5 tablets (40 mg total) by mouth daily.  . cholecalciferol (VITAMIN D) 400 units TABS tablet Take 400 Units by mouth daily. Patient is not sure of the units.  . fluticasone (FLONASE) 50 MCG/ACT nasal spray 1 spray each nostril following sinus rinses twice daily  . hydrochlorothiazide (MICROZIDE) 12.5 MG capsule  Take 1 capsule (12.5 mg total) by mouth daily.  Marland Kitchen losartan (COZAAR) 50 MG tablet Take 1 tablet (50 mg total) by mouth daily.  . sertraline (ZOLOFT) 50 MG tablet Take 1.5 tablets (75 mg total) by mouth daily.  . vitamin B-12 (CYANOCOBALAMIN) 500 MCG tablet Take 1 tablet (500 mcg total) by mouth daily.   Facility-Administered Encounter Medications as of 11/21/2020  Medication  . 0.9 %  sodium chloride infusion    Allergies (verified) Ibuprofen   History: Past Medical History:  Diagnosis Date  . Anemia    during childhood  . Arthritis    hands, shoulder  . Breast cancer ([place]) 03/2000   left side modified mastecomy  . Cancer [facility])    breast, age 57; left side  . Colon cancer ([place]) 02/11/2017   rectum-squamous cell carcinoma  . Depression   . Hypertension   . Personal history of chemotherapy   . Personal history of radiation therapy   . Squamous cell carcinoma of rectum ([place]) 02/14/2017   Past Surgical History:  Procedure Laterality Date  . ABDOMINAL HYSTERECTOMY     With bilateral oophorectomy. 2002.  Marland Kitchen BREAST BIOPSY Right 2004   benign  . BREAST SURGERY Left 2001   biopsy  . CATARACT EXTRACTION Bilateral 2017  . IR FLUORO GUIDE CV LINE RIGHT  03/11/2017  . IR FLUORO GUIDE CV LINE RIGHT  04/16/2017  . IR US GUIDE VASC ACCESS RIGHT  03/11/2017  . IR US GUIDE VASC ACCESS RIGHT  04/16/2017  . MASTECTOMY MODIFIED RADICAL Left 2001  . MENISCUS DEBRIDEMENT Right 2003  . TUMOR EXCISION Right    thigh area   Family History  Problem Relation Age of Onset  . Cancer Mother 17       breast  . Breast cancer Mother   . Heart disease Father   . Colon cancer Neg Hx   . Esophageal cancer Neg Hx   . Rectal cancer Neg Hx   . Stomach cancer Neg Hx    Social History   Socioeconomic History  . Marital status: Widowed    Spouse name: Not on file  . Number of children: Not on file  . Years of education: Not on file  . Highest education level: Not on file  Occupational History  . Not  on file  Tobacco Use  . Smoking status: Never Smoker  . Smokeless tobacco: Never Used  Substance and Sexual Activity  . Alcohol use: Yes    Alcohol/week: 14.0 standard drinks    Types: 14 Glasses of wine per week  . Drug use: No  . Sexual activity: Not Currently  Other Topics Concern  . Not on file  Social History Narrative  . Not on file   Social Determinants of Health   Financial Resource Strain: Not on file  Food Insecurity: Not on file  Transportation Needs: Not on file  Physical Activity: Not on file  Stress: Not on file  Social Connections: Not on file    Tobacco Counseling Counseling given: Not Answered    Diabetic?No         Activities of Daily Living In your present state of health, do you have any difficulty performing the following activities: 11/21/2020 08/22/2020  Hearing? N N  Vision? N Y  Difficulty concentrating or making decisions? [name]  Walking or climbing stairs? N Y  Dressing or bathing? N N  Doing errands, shopping? N N  Some recent data might be hidden    Patient Care Team: Lorrene Reid, PA-C as PCP - General Silverio Decamp, MD as Consulting Physician (Family Medicine) Truitt Merle, MD as Consulting Physician (Hematology) Leighton Ruff, MD as Consulting Physician (General Surgery)  Indicate any recent Medical Services you may have received from other than [facility] providers in the past year (date may be approximate).     Assessment:   This is a routine wellness examination for Denise Wolfe.  Hearing/Vision screen No exam data present  Dietary issues and exercise activities discussed:  -Recommend to continue heart healthy diet and increase physical activity.  Goals   None    Depression Screen PHQ 2/9 Scores 11/21/2020 08/22/2020 05/25/2020 02/23/2020 11/26/2019 09/09/2019 08/13/2019  PHQ - 2 Score 0 0 0 0 0 2 0  PHQ- 9 Score 5 3 3 5 7 9 3     Fall Risk Fall Risk  11/21/2020 08/22/2020 05/25/2020 02/23/2020 12/15/2018  Falls in the  past year? 0 0 1 0 0  Number falls in past yr: - - 0 - -  Injury with Fall? - - 0 - -  Risk for fall due to : - - - No Fall Risks -  Follow up Falls evaluation completed Falls evaluation completed Falls evaluation completed Falls evaluation completed Falls evaluation completed    [place]:  Any stairs in or around the home? Yes  If so, are there any without handrails? Yes  Home free of loose throw rugs in walkways, pet beds, electrical cords, etc?  Yes  Adequate lighting in your home to reduce risk of falls? Yes   ASSISTIVE DEVICES UTILIZED TO PREVENT FALLS:  Life alert? No  Use of a cane, walker or w/c? No  Grab bars in the bathroom? No  Shower chair or bench in shower? No  Elevated toilet seat or a handicapped toilet? No   TIMED UP AND GO:  Was the test performed? No .  Length of time to ambulate 10 feet:  sec.   Telehealth Cognitive Function: wnl     6CIT Screen 11/21/2020 12/15/2018  What Year? 0 points 0 points  What month? 0 points 0 points  What time? 0 points 0 points  Count back from 20 0 points 0 points  Months in reverse 0 points 0 points  Repeat phrase 0 points 0 points  Total Score 0 0    Immunizations Immunization History  Administered Date(s) Administered  . Influenza, High Dose Seasonal PF 07/23/2017, 06/26/2018  . Pneumococcal Conjugate-13 12/27/2016  . Pneumococcal Polysaccharide-23 01/19/2009  . Td 05/17/2004    TDAP status: Due, Education has been provided regarding the importance of this vaccine. Advised may receive this vaccine at local pharmacy or Health Dept. Aware to provide a copy of the vaccination record if obtained from local pharmacy or Health Dept. Verbalized acceptance and understanding.  Flu Vaccine status: Declined, Education has been provided regarding the importance of this vaccine but patient still declined. Advised may receive this vaccine at local pharmacy or Health Dept. Aware to provide a copy  of the vaccination record if obtained from local pharmacy or Health Dept. Verbalized acceptance and understanding.  Pneumococcal vaccine status: Up to date  Covid-19 vaccine status: Completed vaccines  Qualifies for Shingles Vaccine? Yes   Zostavax completed No   Shingrix Completed?: No.    Education has been provided regarding the importance of this vaccine. Patient has been advised to call insurance company to determine out of pocket expense if they have not yet received this vaccine. Advised may also receive vaccine at local pharmacy or Health Dept. Verbalized acceptance and understanding.  Screening Tests Health Maintenance  Topic Date Due  . Hepatitis C Screening  Never done  . COVID-19 Vaccine (1) Never done  . TETANUS/TDAP  05/17/2014  . INFLUENZA VACCINE  03/13/2021  . DEXA SCAN  Completed  . PNA vac Low Risk Adult  Completed  . HPV VACCINES  Aged Out    Health Maintenance  Health Maintenance Due  Topic Date Due  . Hepatitis C Screening  Never done  . COVID-19 Vaccine (1) Never done  . TETANUS/TDAP  05/17/2014    Colorectal cancer screening: No longer required.   Mammogram status: No longer required due to age.  Bone Density status: Ordered today. Pt provided with contact info and advised to call to schedule appt.  Lung Cancer Screening: (Low Dose CT Chest recommended if Age 39-80 years, 30 pack-year currently smoking OR have quit w/in 15years.) does not qualify.   Lung Cancer Screening Referral:   Additional Screening:  Hepatitis C Screening: does qualify; Completed pt declined  Vision Screening: Recommended annual ophthalmology exams for early detection of glaucoma and other disorders of the eye. Is the patient up to date with their annual eye exam?  Yes  Who is the provider or what is the name of the office in which the patient attends annual eye exams? [facility] If pt is not established with a provider, would they like to be referred to a  provider to establish care? No .   Dental Screening: Recommended annual dental exams for proper oral hygiene  Community Resource Referral / Chronic Care Management: CRR required this visit?  No   CCM required this visit?  No      Plan:  -Discussed most recent labs, which are essentially within normal limits or stable from prior. -Continue current medication regimen. -Follow up in 3 months for ADD, HTN, HLD  I have personally reviewed and noted the following in the patient's chart:   . Medical and social history . Use of alcohol, tobacco or illicit drugs  . Current medications and supplements . Functional ability and status . Nutritional status . Physical activity . Advanced directives . List of other physicians . Hospitalizations, surgeries, and ER visits in previous 12 months . Vitals . Screenings to include cognitive, depression, and falls . Referrals and appointments  In addition, I have reviewed and discussed with patient certain preventive protocols, quality metrics, and best practice recommendations. A written personalized care plan for preventive services as well as general preventive health recommendations were provided to patient.

## 2020-12-12 ENCOUNTER — Telehealth: Payer: Self-pay | Admitting: Physician Assistant

## 2020-12-12 ENCOUNTER — Other Ambulatory Visit: Payer: Self-pay | Admitting: Physician Assistant

## 2020-12-12 DIAGNOSIS — E2839 Other primary ovarian failure: Secondary | ICD-10-CM

## 2020-12-12 DIAGNOSIS — Z780 Asymptomatic menopausal state: Secondary | ICD-10-CM

## 2020-12-12 NOTE — Telephone Encounter (Signed)
Patient called to inquire status of bone density order. Please cal and advise. Thank you

## 2020-12-12 NOTE — Telephone Encounter (Signed)
Spoke with Sharyn Lull from [facility] who sees the order in [place] and is going to contact pt to schedule. AS, CMA

## 2020-12-13 ENCOUNTER — Other Ambulatory Visit: Payer: Self-pay

## 2020-12-13 ENCOUNTER — Ambulatory Visit
Admission: RE | Admit: 2020-12-13 | Discharge: 2020-12-13 | Disposition: A | Discharge status: Home / Self Care | Payer: Medicare Other | Source: Ambulatory Visit | Attending: Physician Assistant | Admitting: Physician Assistant

## 2020-12-13 DIAGNOSIS — Z780 Asymptomatic menopausal state: Secondary | ICD-10-CM

## 2020-12-13 DIAGNOSIS — M8589 Other specified disorders of bone density and structure, multiple sites: Secondary | ICD-10-CM | POA: Diagnosis not present

## 2020-12-13 DIAGNOSIS — E2839 Other primary ovarian failure: Secondary | ICD-10-CM

## 2020-12-31 ENCOUNTER — Other Ambulatory Visit: Payer: Self-pay | Admitting: Physician Assistant

## 2020-12-31 DIAGNOSIS — E782 Mixed hyperlipidemia: Secondary | ICD-10-CM

## 2021-02-14 ENCOUNTER — Other Ambulatory Visit: Payer: Self-pay | Admitting: Physician Assistant

## 2021-02-14 ENCOUNTER — Telehealth: Payer: Self-pay | Admitting: Physician Assistant

## 2021-02-14 NOTE — Telephone Encounter (Signed)
Contacted patient and advised apt needed for further medication refills per refill protocol. Pt verbalized understanding and scheduled future appointment. AS, CMA

## 2021-03-01 ENCOUNTER — Other Ambulatory Visit: Payer: Self-pay

## 2021-03-01 ENCOUNTER — Ambulatory Visit (INDEPENDENT_AMBULATORY_CARE_PROVIDER_SITE_OTHER): Payer: Medicare Other | Admitting: Physician Assistant

## 2021-03-01 ENCOUNTER — Encounter: Payer: Self-pay | Admitting: Physician Assistant

## 2021-03-01 VITALS — BP 130/90 | HR 93 | Temp 98.3°F | Ht 61.5 in | Wt 132.5 lb

## 2021-03-01 DIAGNOSIS — F988 Other specified behavioral and emotional disorders with onset usually occurring in childhood and adolescence: Secondary | ICD-10-CM | POA: Diagnosis not present

## 2021-03-01 DIAGNOSIS — F430 Acute stress reaction: Secondary | ICD-10-CM

## 2021-03-01 DIAGNOSIS — I10 Essential (primary) hypertension: Secondary | ICD-10-CM | POA: Diagnosis not present

## 2021-03-01 DIAGNOSIS — F339 Major depressive disorder, recurrent, unspecified: Secondary | ICD-10-CM

## 2021-03-01 MED ORDER — AMPHETAMINE-DEXTROAMPHETAMINE 10 MG PO TABS: 10.0000 mg | ORAL_TABLET | ORAL | 0 refills | Status: DC

## 2021-03-01 NOTE — Patient Instructions (Signed)
Managing Stress, Adult Feeling a certain amount of stress is normal. Stress helps our body and mind get ready to deal with the demands of life. Stress hormones can motivate you to do well at work and meet your responsibilities. However severe or long-lasting (chronic) stress can affect your mental and physical health. Chronic stress puts you at higher risk for anxiety, depression, and other health problems like digestiveproblems, muscle aches, heart disease, high blood pressure, and stroke. What are the causes? Common causes of stress include: Demands from work, such as deadlines, feeling overworked, or having long hours. Pressures at home, such as money issues, disagreements with a spouse, or parenting issues. Pressures from major life changes, such as divorce, moving, loss of a loved one, or chronic illness. You may be at higher risk for stress-related problems if you do not get enough sleep, are in poor health, do not have emotional support, or have a mentalhealth disorder like anxiety or depression. How to recognize stress Stress can make you: Have trouble sleeping. Feel sad, anxious, irritable, or overwhelmed. Lose your appetite. Overeat or want to eat unhealthy foods. Want to use drugs or alcohol. Stress can also cause physical symptoms, such as: Sore, tense muscles, especially in the shoulders and neck. Headaches. Trouble breathing. A faster heart rate. Stomach pain, nausea, or vomiting. Diarrhea or constipation. Trouble concentrating. Follow these instructions at home: Lifestyle Identify the source of your stress and your reaction to it. See a therapist who can help you change your reactions. When there are stressful events: Talk about it with family, friends, or co-workers. Try to think realistically about stressful events and not ignore them or overreact. Try to find the positives in a stressful situation and not focus on the negatives. Cut back on responsibilities at work and  home, if possible. Ask for help from friends or family members if you need it. Find ways to cope with stress, such as: Meditation. Deep breathing. Yoga or tai chi. Progressive muscle relaxation. Doing art, playing music, or reading. Making time for fun activities. Spending time with family and friends. Get support from family, friends, or spiritual resources. Eating and drinking Eat a healthy diet. This includes: Eating foods that are high in fiber, such as beans, whole grains, and fresh fruits and vegetables. Limiting foods that are high in fat and processed sugars, such as fried and sweet foods. Do not skip meals or overeat. Drink enough fluid to keep your urine pale yellow. Alcohol use Do not drink alcohol if: Your health care provider tells you not to drink. You are pregnant, may be pregnant, or are planning to become pregnant. Drinking alcohol is a way some people try to ease their stress. This can be dangerous, so if you drink alcohol: Limit how much you use to: 0-1 drink a day for women. 0-2 drinks a day for men. Be aware of how much alcohol is in your drink. In the [place] one drink equals one 12 oz bottle of beer (355 mL), one 5 oz glass of wine (148 mL), or one 1 oz glass of hard liquor (44 mL). Activity  Include 30 minutes of exercise in your daily schedule. Exercise is a good stress reducer. Include time in your day for an activity that you find relaxing. Try taking a walk, going on a bike ride, reading a book, or listening to music. Schedule your time in a way that lowers stress, and keep a consistent schedule. Prioritize what is most important to get done.  General  instructions Get enough sleep. Try to go to sleep and get up at about the same time every day. Take over-the-counter and prescription medicines only as told by your health care provider. Do not use any products that contain nicotine or tobacco, such as cigarettes, e-cigarettes, and chewing tobacco. If you  need help quitting, ask your health care provider. Do not use drugs or smoke to cope with stress. Keep all follow-up visits as told by your health care provider. This is important. Where to find support Talk with your health care provider about stress management or finding a support group. Find a therapist to work with you on your stress management techniques. Contact a health care provider if: Your stress symptoms get worse. You are unable to manage your stress at home. You are struggling to stop using drugs or alcohol. Get help right away if: You may be a danger to yourself or others. You have any thoughts of death or suicide. If you ever feel like you may hurt yourself or others, or have thoughts about taking your own life, get help right away. You can go to your nearest emergency department or call: Your local emergency services (911 in the [place] A suicide crisis helpline, such as the [facility] at 7543335843. This is open 24 hours a day. Summary Feeling a certain amount of stress is normal, but severe or long-lasting (chronic) stress can affect your mental and physical health. Chronic stress can put you at higher risk for anxiety, depression, and other health problems like digestive problems, muscle aches, heart disease, high blood pressure, and stroke. You may be at higher risk for stress-related problems if you do not get enough sleep, are in poor health, lack emotional support, or have a mental health disorder like anxiety or depression. Identify the source of your stress and your reaction to it. Try talking about stressful events with family, friends, or co-workers, finding a coping method, or getting support from spiritual resources. If you need more help, talk with your health care provider about finding a support group or a mental health therapist. This information is not intended to replace advice given to you by your health care provider. Make sure  you discuss any questions you have with your healthcare provider. Document Revised: 02/25/2019 Document Reviewed: 02/25/2019 Elsevier Patient Education  [place].

## 2021-03-01 NOTE — Progress Notes (Signed)
Established Patient Office Visit  Subjective:  Patient ID: Denise Wolfe, female    DOB: [birth year]  Age: 80 y.o. MRN: [id number]  CC:  Chief Complaint  Patient presents with   Follow-up   Hypertension    HPI Daren Doswell presents for follow up on hypertension, ADD and mood. Patient has no acute concerns today.  HTN: Pt denies chest pain, palpitations, dizziness or lower extremity swelling. Taking medication as directed without side effects. Checks BP at home and reports recent readings have been higher than usual, 140s/80s. Patient does report increased personal stress with a "situational thing" going on, did not specify details.  Pt reports good hydration.  ADD: Patient reports has been on Adderall for numerous years. Medication helps with focus. Can notice a difference when she misses a dose. Denies chest pain, palpitations, sleep disturbance or mood changes.  Mood: Reports medication compliance. Reports currently under increased stress which is situational. Denies SI/HI.   Past Medical History:  Diagnosis Date   Anemia    during childhood   Arthritis    hands, shoulder   Breast cancer ([place]) 03/2000   left side modified mastecomy   Cancer [facility])    breast, age 37; left side   Colon cancer ([place]) 02/11/2017   rectum-squamous cell carcinoma   Depression    Hypertension    Personal history of chemotherapy    Personal history of radiation therapy    Squamous cell carcinoma of rectum ([place]) 02/14/2017    Past Surgical History:  Procedure Laterality Date   ABDOMINAL HYSTERECTOMY     With bilateral oophorectomy. 2002.   BREAST BIOPSY Right 2004   benign   BREAST SURGERY Left 2001   biopsy   CATARACT EXTRACTION Bilateral 2017   IR FLUORO GUIDE CV LINE RIGHT  03/11/2017   IR FLUORO GUIDE CV LINE RIGHT  04/16/2017   IR US GUIDE VASC ACCESS RIGHT  03/11/2017   IR US GUIDE VASC ACCESS RIGHT  04/16/2017   MASTECTOMY MODIFIED RADICAL Left 2001   MENISCUS DEBRIDEMENT Right 2003    TUMOR EXCISION Right    thigh area    Family History  Problem Relation Age of Onset   Cancer Mother 43       breast   Breast cancer Mother    Heart disease Father    Colon cancer Neg Hx    Esophageal cancer Neg Hx    Rectal cancer Neg Hx    Stomach cancer Neg Hx     Social History   Socioeconomic History   Marital status: Widowed    Spouse name: Not on file   Number of children: Not on file   Years of education: Not on file   Highest education level: Not on file  Occupational History   Not on file  Tobacco Use   Smoking status: Never   Smokeless tobacco: Never  Substance and Sexual Activity   Alcohol use: Yes    Alcohol/week: 14.0 standard drinks    Types: 14 Glasses of wine per week   Drug use: No   Sexual activity: Not Currently  Other Topics Concern   Not on file  Social History Narrative   Not on file   Social Determinants of Health   Financial Resource Strain: Not on file  Food Insecurity: Not on file  Transportation Needs: Not on file  Physical Activity: Not on file  Stress: Not on file  Social Connections: Not on file  Intimate Partner Violence: Not on file  Outpatient Medications Prior to Visit  Medication Sig Dispense Refill   atorvastatin (LIPITOR) 80 MG tablet TAKE ONE-HALF TABLET BY  MOUTH DAILY 45 tablet 3   cholecalciferol (VITAMIN D) 400 units TABS tablet Take 400 Units by mouth daily. Patient is not sure of the units.     fluticasone (FLONASE) 50 MCG/ACT nasal spray 1 spray each nostril following sinus rinses twice daily 16 g 5   hydrochlorothiazide (MICROZIDE) 12.5 MG capsule Take 1 capsule (12.5 mg total) by mouth daily. **NEEDS APT FOR REFILLS** 30 capsule 0   losartan (COZAAR) 50 MG tablet Take 1 tablet (50 mg total) by mouth daily. **NEEDS APT FOR REFILLS** 30 tablet 0   sertraline (ZOLOFT) 50 MG tablet Take 1.5 tablets (75 mg total) by mouth daily. 135 tablet 1   vitamin B-12 (CYANOCOBALAMIN) 500 MCG tablet Take 1 tablet (500 mcg  total) by mouth daily.     amphetamine-dextroamphetamine (ADDERALL) 10 MG tablet Take 1 tablet (10 mg total) by mouth every morning. 90 tablet 0   Facility-Administered Medications Prior to Visit  Medication Dose Route Frequency Provider Last Rate Last Admin   0.9 %  sodium chloride infusion  500 mL Intravenous Continuous [name], MD        Allergies  Allergen Reactions   Ibuprofen Rash    ROS Review of Systems Review of Systems:  A fourteen system review of systems was performed and found to be positive as per HPI.  Objective:    Physical Exam General:  Well Developed, well nourished, appropriate for stated age.  Neuro:  Alert and oriented,  extra-ocular muscles intact  HEENT:  Normocephalic, atraumatic, neck supple Skin:  no gross rash, warm, pink. Cardiac:  RRR Respiratory:  ECTA B/L w/o wheezing, crackles or rales, Not using accessory muscles, speaking in full sentences- unlabored. Vascular:  Ext warm, no cyanosis apprec.; cap RF less 2 sec. Psych:  No HI/SI, judgement and insight good, Euthymic mood. Full Affect.  BP 130/90   Pulse 93   Temp 98.3 F (36.8 C)   Ht 5' 1.5" (1.562 m)   Wt 132 lb 8 oz (60.1 kg)   SpO2 96%   BMI 24.63 kg/m  Wt Readings from Last 3 Encounters:  03/01/21 132 lb 8 oz (60.1 kg)  11/21/20 138 lb (62.6 kg)  09/07/20 138 lb (62.6 kg)     Health Maintenance Due  Topic Date Due   COVID-19 Vaccine (1) Never done   Hepatitis C Screening  Never done   Zoster Vaccines- Shingrix (1 of 2) Never done   TETANUS/TDAP  05/17/2014    There are no preventive care reminders to display for this patient.  Lab Results  Component Value Date   TSH 1.950 11/17/2020   Lab Results  Component Value Date   WBC 3.0 (L) 11/17/2020   HGB 12.9 11/17/2020   HCT 38.7 11/17/2020   MCV 91 11/17/2020   PLT 309 11/17/2020   Lab Results  Component Value Date   NA 144 11/17/2020   K 4.6 11/17/2020   CHLORIDE 105 06/11/2017   CO2 25 11/17/2020    GLUCOSE 101 (H) 11/17/2020   BUN 17 11/17/2020   CREATININE 0.79 11/17/2020   BILITOT 0.3 11/17/2020   ALKPHOS 42 (L) 11/17/2020   AST 16 11/17/2020   ALT 16 11/17/2020   PROT 6.3 11/17/2020   ALBUMIN 4.1 11/17/2020   CALCIUM 9.4 11/17/2020   ANIONGAP 9 07/31/2018   EGFR 77 11/17/2020   GFR 74.49  02/15/2017   Lab Results  Component Value Date   CHOL 189 11/17/2020   Lab Results  Component Value Date   HDL 79 11/17/2020   Lab Results  Component Value Date   LDLCALC 99 11/17/2020   Lab Results  Component Value Date   TRIG 57 11/17/2020   Lab Results  Component Value Date   CHOLHDL 2.4 11/17/2020   Lab Results  Component Value Date   HGBA1C 5.1 07/11/2016      Assessment & Plan:   Problem List Items Addressed This Visit       Cardiovascular and Mediastinum   HTN (hypertension), benign - Primary (Chronic)     Other   Depression, recurrent (HCC) (Chronic)   Mixed disorder as reaction to stress (Chronic)   Attention deficit disorder (Chronic)   Relevant Medications   amphetamine-dextroamphetamine (ADDERALL) 10 MG tablet   Hypertension, benign: -Stable. -Continue current medication. Will repeat CMP for medication monitoring at follow-up visit. -Continue ambulatory BP monitoring, advised to notify the clinic if BP consistently >140/90 for medication adjustments.  Encourage stress reduction techniques. -Will continue to monitor.  Recurrent depression, Mixed disorder as a reaction to stress: -PHQ-9 score of 7, GAD 7 score of 4. -Patient prefers to continue current medication regimen, current stress is situational. -Will continue to monitor.  Attention deficit disorder: -Stable. -PDMP reviewed, no aberrancies noted.  Adderall last filled 11/21/20 for 90-day supply.  Provided refill. -BP initially elevated, BP recheck improved.  Pulse within normal limits. -Will continue to monitor.   Meds ordered this encounter  Medications    amphetamine-dextroamphetamine (ADDERALL) 10 MG tablet    Sig: Take 1 tablet (10 mg total) by mouth every morning.    Dispense:  90 tablet    Refill:  0    Order Specific Question:   Supervising Provider    Answer:   Beatrice Lecher D [[id number]]    Follow-up: Return in about 3 months (around 06/01/2021) for HTN, ADD, HLD and FBW (lipid panel, cmp, cbc, Vit D).   Note:  This note was prepared with assistance of Dragon voice recognition software. Occasional wrong-word or sound-a-like substitutions may have occurred due to the inherent limitations of voice recognition software.  Lorrene Reid, PA-C

## 2021-03-06 DIAGNOSIS — Z23 Encounter for immunization: Secondary | ICD-10-CM | POA: Diagnosis not present

## 2021-03-23 ENCOUNTER — Other Ambulatory Visit: Payer: Self-pay | Admitting: Physician Assistant

## 2021-03-23 DIAGNOSIS — F430 Acute stress reaction: Secondary | ICD-10-CM

## 2021-04-13 ENCOUNTER — Other Ambulatory Visit: Payer: Self-pay | Admitting: Physician Assistant

## 2021-06-02 ENCOUNTER — Ambulatory Visit (INDEPENDENT_AMBULATORY_CARE_PROVIDER_SITE_OTHER): Payer: Medicare Other | Admitting: Physician Assistant

## 2021-06-02 ENCOUNTER — Encounter: Payer: Self-pay | Admitting: Physician Assistant

## 2021-06-02 VITALS — BP 127/67 | HR 81 | Temp 96.6°F | Ht 62.0 in | Wt 132.0 lb

## 2021-06-02 DIAGNOSIS — F430 Acute stress reaction: Secondary | ICD-10-CM

## 2021-06-02 DIAGNOSIS — F988 Other specified behavioral and emotional disorders with onset usually occurring in childhood and adolescence: Secondary | ICD-10-CM

## 2021-06-02 DIAGNOSIS — F339 Major depressive disorder, recurrent, unspecified: Secondary | ICD-10-CM | POA: Diagnosis not present

## 2021-06-02 DIAGNOSIS — E782 Mixed hyperlipidemia: Secondary | ICD-10-CM | POA: Diagnosis not present

## 2021-06-02 DIAGNOSIS — I10 Essential (primary) hypertension: Secondary | ICD-10-CM

## 2021-06-02 MED ORDER — AMPHETAMINE-DEXTROAMPHETAMINE 10 MG PO TABS: 10.0000 mg | ORAL_TABLET | ORAL | 0 refills | Status: DC

## 2021-06-02 MED ORDER — SERTRALINE HCL 50 MG PO TABS: 75.0000 mg | ORAL_TABLET | Freq: Every day | ORAL | 1 refills | Status: DC

## 2021-06-02 NOTE — Progress Notes (Signed)
Telehealth office visit note for Denise Reid, PA-C- at Primary Care at [facility]   I connected with current patient today by telephone and verified that I am speaking with the correct person    Location of the patient: Home  Location of the provider: Office - This visit type was conducted due to national recommendations for restrictions regarding the COVID-19 Pandemic (e.g. social distancing) in an effort to limit this patient's exposure and mitigate transmission in our community.    - No physical exam could be performed with this format, beyond that communicated to [place] by the patient/ family members as noted.   - Additionally my office staff/ schedulers were to discuss with the patient that there may be a monetary charge related to this service, depending on their medical insurance.  My understanding is that patient understood and consented to proceed.     _________________________________________________________________________________   History of Present Illness: Patient calls in to follow up on hypertension, ADD, mood and hyperlipidemia. Patient reports is doing well, has no acute concerns.  HTN: Pt denies chest pain, palpitations, dizziness or lower extremity swelling. Taking medication as directed without side effects. Checks BP at home  and readings range <135/80. Pt follows a low salt diet.  ADD: Reports medication compliance. Denies changes with concentration. No palpitations, mood or appetite changes, or sleep disturbance.  Mood: Taking medication. States her situational stress has improved and mood has been stable. Denies SI/HI.  HLD: Pt taking medication as directed without issues. States continues to stay active with with house/yard work.      GAD 7 : Generalized Anxiety Score 06/02/2021 03/01/2021 11/26/2019 09/09/2019  Nervous, Anxious, on Edge 0 0 0 0  Control/stop worrying 0 0 0 0  Worry too much - different things 0 0 0 0  Trouble relaxing 0 3 1 1    Restless 0 1 1 3   Easily annoyed or irritable 0 0 0 0  Afraid - awful might happen 0 0 0 0  Total GAD 7 Score 0 4 2 4   Anxiety Difficulty Not difficult at all - Not difficult at all Somewhat difficult    Depression screen [facility] 2/9 06/02/2021 03/01/2021 11/21/2020 08/22/2020 05/25/2020  Decreased Interest 0 1 0 0 0  Down, Depressed, Hopeless 0 1 0 0 0  PHQ - 2 Score 0 2 0 0 0  Altered sleeping 0 2 3 1 2   Tired, decreased energy 0 1 0 1 0  Change in appetite 0 0 0 0 0  Feeling bad or failure about yourself  0 0 0 0 0  Trouble concentrating 0 2 2 1 1   Moving slowly or fidgety/restless 0 0 0 0 0  Suicidal thoughts 0 0 0 0 0  PHQ-9 Score 0 7 5 3 3   Difficult doing work/chores Not difficult at all - - Somewhat difficult Not difficult at all  Some recent data might be hidden      Impression and Recommendations:     1. Depression, recurrent ([place])   2. Mixed hyperlipidemia   3. Attention deficit disorder, unspecified hyperactivity presence   4. HTN (hypertension), benign   5. Mixed disorder as reaction to stress     Recurrent depression, Mixed disorder as reaction to stress: -Stable. -Continue current medication regimen. Provided refill. -Will continue to monitor.  ADD, unspecified hyperactivity presence: -Stable. -Continue current medication regimen. PDMP reviewed, no aberrancies noted.  -Follow up in 3 months for medication management.   Mixed hyperlipidemia: -  Stable. -Continue current medication regimen. Hepatic function normal. -Will continue to monitor. Will repeat lipid panel/hepatic functio at follow up visit.  HTN, benign: -Controlled. -Continue current medication regimen. -Will repeat CMP for medication monitoring at follow up visit. -Will continue to monitor.     - As part of my medical decision making, I reviewed the following data within the [place] History obtained from pt /family, CMA notes reviewed and incorporated if applicable, Labs  reviewed, Radiograph/ tests reviewed if applicable and OV notes from prior OV's with me, as well as any other specialists she/he has seen since seeing me last, were all reviewed and used in my medical decision making process today.    - Additionally, when appropriate, discussion had with patient regarding our treatment plan, and their biases/concerns about that plan were used in my medical decision making today.    - The patient agreed with the plan and demonstrated an understanding of the instructions.   No barriers to understanding were identified.     - The patient was advised to call back or seek an in-person evaluation if the symptoms worsen or if the condition fails to improve as anticipated.   Return in about 3 months (around 09/02/2021) for HTN, HLD, ADD and FBW.    No orders of the defined types were placed in this encounter.   Meds ordered this encounter  Medications   amphetamine-dextroamphetamine (ADDERALL) 10 MG tablet    Sig: Take 1 tablet (10 mg total) by mouth every morning.    Dispense:  90 tablet    Refill:  0    Order Specific Question:   Supervising Provider    Answer:   Beatrice Lecher D [[id number]]   sertraline (ZOLOFT) 50 MG tablet    Sig: Take 1.5 tablets (75 mg total) by mouth daily.    Dispense:  135 tablet    Refill:  1    Requesting 1 year supply    Order Specific Question:   Supervising Provider    Answer:   Beatrice Lecher D [[id number]]    Medications Discontinued During This Encounter  Medication Reason   amphetamine-dextroamphetamine (ADDERALL) 10 MG tablet Reorder   sertraline (ZOLOFT) 50 MG tablet Reorder       Time spent on telephone encounter was 7 minutes.   Note:  This note was prepared with assistance of Dragon voice recognition software. Occasional wrong-word or sound-a-like substitutions may have occurred due to the inherent limitations of voice recognition software.    The [place] was signed into law in 2016 which  includes the topic of electronic health records.  This provides immediate access to information in MyChart.  This includes consultation notes, operative notes, office notes, lab results and pathology reports.  If you have any questions about what you read please let us know at your next visit or call us at the office.  We are right here with you.   __________________________________________________________________________________     Patient Care Team    Relationship Specialty Notifications Start End  Denise Wolfe, [place] PCP - General   12/13/19   Silverio Decamp, MD Consulting Physician Family Medicine  07/25/16    Comment: OA - shoulder injeciton  Truitt Merle, MD Consulting Physician Hematology  70/01/74   Leighton Ruff, MD Consulting Physician General Surgery  07/23/17      -Vitals obtained; medications/ allergies reconciled;  personal medical, social, Sx etc.histories were updated by CMA, reviewed by me and are reflected in chart  Patient Active Problem List   Diagnosis Date Noted   Dizziness on standing 08/13/2019   B12 deficiency 08/13/2019   Personal history of radiation therapy 09/30/2018   Hx antineoplastic chemo 09/30/2018   Anemia due to antineoplastic chemotherapy 05/14/2017   Pneumonia of left lower lobe due to infectious organism 05/09/2017   Difficulty sleeping 05/09/2017   Burning sensation- scalp esp at night 05/09/2017   Wheeze 05/09/2017   Chemotherapy induced neutropenia ([place]) 04/14/2017   Squamous cell carcinoma of anal canal ([place]) 04/11/2017   Squamous cell carcinoma of rectum ([place]) 02/14/2017   Colon cancer ([place]) 02/11/2017   Attention deficit disorder 12/27/2016   Multiple atypical nevi 12/27/2016   Nonspecific chest pain 08/08/2016   Mixed disorder as reaction to stress 08/08/2016   Vitamin D insufficiency 07/19/2016   HLD (hyperlipidemia) 07/19/2016   Retrosternal chest pain 07/19/2016   Degenerative joint disease (DJD) of sternoclavicular  joint, right 07/19/2016   Elevated HDL:  >er 90 07/19/2016   History of breast Cancer 05/10/2016   Depression, recurrent ([place]) 05/10/2016   Cataract 05/10/2016   Pain in joint, shoulder region 05/10/2016   HTN (hypertension), benign 05/10/2016   Breast cancer ([place]) 03/13/2000     Current Meds  Medication Sig   atorvastatin (LIPITOR) 80 MG tablet TAKE ONE-HALF TABLET BY  MOUTH DAILY   cholecalciferol (VITAMIN D) 400 units TABS tablet Take 400 Units by mouth daily. Patient is not sure of the units.   fluticasone (FLONASE) 50 MCG/ACT nasal spray 1 spray each nostril following sinus rinses twice daily   hydrochlorothiazide (MICROZIDE) 12.5 MG capsule TAKE 1 CAPSULE BY MOUTH  DAILY   losartan (COZAAR) 50 MG tablet TAKE 1 TABLET BY MOUTH  DAILY   vitamin B-12 (CYANOCOBALAMIN) 500 MCG tablet Take 1 tablet (500 mcg total) by mouth daily.   [DISCONTINUED] sertraline (ZOLOFT) 50 MG tablet TAKE 1 AND 1/2 TABLETS BY  MOUTH DAILY   Current Facility-Administered Medications for the 06/02/21 encounter (Office Visit) with Denise Reid, PA-C  Medication   0.9 %  sodium chloride infusion     Allergies:  Allergies  Allergen Reactions   Ibuprofen Rash     ROS:  See above HPI for pertinent positives and negatives   Objective:   Blood pressure 127/67, pulse 81, temperature (!) 96.6 F (35.9 C), height 5\' 2"  (1.575 m), weight 132 lb (59.9 kg).  (if some vitals are omitted, this means that patient was UNABLE to obtain them.) General: A & O * 3; sounds in no acute distress Respiratory: speaking in full sentences, no conversational dyspnea Psych: insight appears good, mood- appears full

## 2021-06-25 ENCOUNTER — Other Ambulatory Visit: Payer: Self-pay | Admitting: Physician Assistant

## 2021-08-18 ENCOUNTER — Ambulatory Visit (INDEPENDENT_AMBULATORY_CARE_PROVIDER_SITE_OTHER): Payer: Medicare Other | Admitting: Family Medicine

## 2021-08-18 VITALS — BP 124/68 | Ht 62.0 in | Wt 135.0 lb

## 2021-08-18 DIAGNOSIS — M25511 Pain in right shoulder: Secondary | ICD-10-CM | POA: Diagnosis not present

## 2021-08-18 DIAGNOSIS — G8929 Other chronic pain: Secondary | ICD-10-CM | POA: Diagnosis not present

## 2021-08-18 MED ORDER — METHYLPREDNISOLONE ACETATE 40 MG/ML IJ SUSP
40.0000 mg | Freq: Once | INTRAMUSCULAR | Status: AC
  Administered 2021-08-18: 40 mg via INTRA_ARTICULAR

## 2021-08-18 NOTE — Patient Instructions (Signed)
Thank you for coming to see me today. It was a pleasure. Today we talked about:   Today you received an injection with corticosteroid. This injection is usually done in response to pain and inflammation. There is some "numbing" medicine also in the shot so the injected area may be numb and feel really good for the next couple of hours. The numbing medicine usually wears off in 2-3 hours though, and then your pain level will be right back where it was before the injection.   The actually benefit from the steroid injection is usually noticed in 2-7 days. You may actually experience a small (as in 10%) INCREASE in pain in the first 24 hours---that is common.   Things to watch out for that you should contact us or a health care provider urgently would include: 1. Unusual (as in more than 10%) increase in pain 2. New fever > 101.5 3. New swelling or redness of the injected area.  4. Streaking of red lines around the area injected.   Please follow-up with us as needed.  If you have any questions or concerns, please do not hesitate to call the office at [phone].  Best,   Teron Blais, DO [facility]  

## 2021-08-18 NOTE — Assessment & Plan Note (Signed)
Known severe right OA, has done very well with prior glenohumeral joint injection.  Discussed risk and benefits of injection and she opts to proceed today.  Typical postinjection course discussed with patient.  She will follow-up as needed.  Procedure performed: Right Glenohumeral corticosteroid injection, ultrasound-guided  Consent obtained and verified. Time-out conducted. Noted no overlying erythema, induration, or other signs of local infection. The  right glenohumeral joint was visualized by ultrasound posteriorly.  The overlying skin was prepped in a sterile fashion. Anesthetic: 3 cc of 1% lidocaine without epinephrine. Joint: right glenohumeral Needle: 22-gauge 3.5 inch Completed without difficulty. Meds: 4 cc 1% lidocaine without epinephrine and 40 mg methylprednisolone

## 2021-08-18 NOTE — Progress Notes (Addendum)
° °  Denise Wolfe is a 81 y.o. female who presents to [facility] today for the following:  Chronic right shoulder pain Has presented previously for glenohumeral joint ultrasound-guided injections, last was on 09/07/2020 Last x-rays performed in August 2021 showed severe glenohumeral joint arthritis Flare started again about 1 month ago Presents today for another injection    PMH reviewed.  ROS as above. Medications reviewed.  Exam:  BP 124/68    Ht 5\' 2"  (1.575 m)    Wt 135 lb (61.2 kg)    BMI 24.69 kg/m  Gen: Well NAD MSK:  Right Shoulder: Inspection reveals no obvious deformity, atrophy, or asymmetry b/l. No bruising. No swelling Palpation is normal with no TTP over [facility] joint or bicipital groove b/l. forward flexion to 90 degrees, abduction 90 degrees, external rotation to 20 degrees, internal rotation to back pocket NV intact distally b/l Special Tests:  - Supraspinatous: Negative empty can - Infraspinatous/Teres Minor: 5/5 strength with ER - Subscapularis: 5/5 strength with IR   No results found.   Assessment and Plan: 1) Chronic right shoulder pain Known severe right OA, has done very well with prior glenohumeral joint injection.  Discussed risk and benefits of injection and she opts to proceed today.  Typical postinjection course discussed with patient.  She will follow-up as needed.  Procedure performed: Right Glenohumeral corticosteroid injection, ultrasound-guided  Consent obtained and verified. Time-out conducted. Noted no overlying erythema, induration, or other signs of local infection. The  right glenohumeral joint was visualized by ultrasound posteriorly.  The overlying skin was prepped in a sterile fashion. Anesthetic: 3 cc of 1% lidocaine without epinephrine. Joint: right glenohumeral Needle: 22-gauge 3.5 inch Completed without difficulty. Meds: 4 cc 1% lidocaine without epinephrine and 40 mg methylprednisolone    [place] Constable, D.O.  PGY-4 [place]  Sports Medicine  08/18/2021 11:04 AM  Addendum:  I was the preceptor for this visit and available for immediate consultation.  Karlton Lemon MD [facility]

## 2021-08-28 ENCOUNTER — Telehealth: Payer: Self-pay | Admitting: Physician Assistant

## 2021-08-28 NOTE — Telephone Encounter (Signed)
Patient called requesting refill of Adderall, pt is going out of town Wednesday and will run out of medication before getting back in town. Please advise. [phone]

## 2021-08-28 NOTE — Telephone Encounter (Signed)
Scheduled a telehealth appointment for tomorrow 08/29/2021

## 2021-08-29 ENCOUNTER — Other Ambulatory Visit: Payer: Self-pay

## 2021-08-29 ENCOUNTER — Telehealth (INDEPENDENT_AMBULATORY_CARE_PROVIDER_SITE_OTHER): Payer: Medicare Other | Admitting: Physician Assistant

## 2021-08-29 ENCOUNTER — Encounter: Payer: Self-pay | Admitting: Physician Assistant

## 2021-08-29 VITALS — BP 124/86 | Ht 62.0 in | Wt 136.0 lb

## 2021-08-29 DIAGNOSIS — F988 Other specified behavioral and emotional disorders with onset usually occurring in childhood and adolescence: Secondary | ICD-10-CM

## 2021-08-29 DIAGNOSIS — F339 Major depressive disorder, recurrent, unspecified: Secondary | ICD-10-CM | POA: Diagnosis not present

## 2021-08-29 DIAGNOSIS — I10 Essential (primary) hypertension: Secondary | ICD-10-CM

## 2021-08-29 MED ORDER — AMPHETAMINE-DEXTROAMPHETAMINE 10 MG PO TABS: 10.0000 mg | ORAL_TABLET | ORAL | 0 refills | Status: DC

## 2021-08-29 NOTE — Progress Notes (Signed)
Telehealth office visit note for Denise Reid, PA-C- at [facility]   I connected with current patient today by telephone call and verified that I am speaking with the correct person    Location of the patient: Home  Location of the provider: Office - This visit type was conducted due to national recommendations for restrictions regarding the COVID-19 Pandemic (e.g. social distancing) in an effort to limit this patient's exposure and mitigate transmission in our community.    - No physical exam could be performed with this format, beyond that communicated to [place] by the patient/ family members as noted.   - Additionally my office staff/ schedulers were to discuss with the patient that there may be a monetary charge related to this service, depending on their medical insurance.  My understanding is that patient understood and consented to proceed.     _________________________________________________________________________________   History of Present Illness: Patient calls in to follow up on mood, ADD, and hypertension.  Mood: Patient reports mood has been stable. Denies mood fluctuations, SI or HI. Reports medication compliance.  ADD: Patient has been on Adderall for numerous years, states more than 5. In the past was Adderall 10 mg BID. Currently on Adderall 10 mg once daily. Reports sometimes does have trouble with focusing with doing things around the house, especially towards the end of the day. But overall, medication helps. Denies chest pain, palpitations, sleep disturbance or mood changes.  Hypertension: Pt denies chest pain, palpitations, dizziness, shortness of breath, or lower extremity swelling. Taking medication as directed without side effects. Checks BP at home and readings range in 120s/-70-80s.       GAD 7 : Generalized Anxiety Score 08/29/2021 06/02/2021 03/01/2021 11/26/2019  Nervous, Anxious, on Edge 0 0 0 0  Control/stop worrying 0 0 0 0  Worry  too much - different things 0 0 0 0  Trouble relaxing 3 0 3 1  Restless 0 0 1 1  Easily annoyed or irritable 0 0 0 0  Afraid - awful might happen 0 0 0 0  Total GAD 7 Score 3 0 4 2  Anxiety Difficulty Not difficult at all Not difficult at all - Not difficult at all    Depression screen [facility] 2/9 08/29/2021 06/02/2021 03/01/2021 11/21/2020 08/22/2020  Decreased Interest 0 0 1 0 0  Down, Depressed, Hopeless 0 0 1 0 0  PHQ - 2 Score 0 0 2 0 0  Altered sleeping 0 0 2 3 1   Tired, decreased energy 0 0 1 0 1  Change in appetite 0 0 0 0 0  Feeling bad or failure about yourself  0 0 0 0 0  Trouble concentrating 2 0 2 2 1   Moving slowly or fidgety/restless 0 0 0 0 0  Suicidal thoughts 0 0 0 0 0  PHQ-9 Score 2 0 7 5 3   Difficult doing work/chores Not difficult at all Not difficult at all - - Somewhat difficult  Some recent data might be hidden      Impression and Recommendations:     1. Depression, recurrent ([place])   2. Attention deficit disorder, unspecified hyperactivity presence   3. HTN (hypertension), benign     Recurrent depression: -PHQ-2 score of 0, PHQ-9 score of 2. Stable. -Continue current medication regimen. -Will continue to monitor.  Attention deficit disorder: -Stable. -PDMP reviewed, no aberrancies noted. Patient reports going out of town tomorrow, advised refill has been and unsure if insurance will allow  early refill. Pt verbalized understanding. -BP has been stable. -Will continue to monitor.  HTN: -Stable. -Continue current medication regimen. -Will continue to monitor. Will collect CMP for medication monitoring with [facility].  Unable to connect via video so visit changed to telephone visit.    - As part of my medical decision making, I reviewed the following data within the [place] History obtained from pt /family, CMA notes reviewed and incorporated if applicable, Labs reviewed, Radiograph/ tests reviewed if applicable and OV notes from prior  OV's with me, as well as any other specialists she/he has seen since seeing me last, were all reviewed and used in my medical decision making process today.    - Additionally, when appropriate, discussion had with patient regarding our treatment plan, and their biases/concerns about that plan were used in my medical decision making today.    - The patient agreed with the plan and demonstrated an understanding of the instructions.   No barriers to understanding were identified.     - The patient was advised to call back or seek an in-person evaluation if the symptoms worsen or if the condition fails to improve as anticipated.   Return in about 3 months (around 11/27/2021) for [facility] and [facility] a week before .    No orders of the defined types were placed in this encounter.   Meds ordered this encounter  Medications   amphetamine-dextroamphetamine (ADDERALL) 10 MG tablet    Sig: Take 1 tablet (10 mg total) by mouth every morning.    Dispense:  90 tablet    Refill:  0    Order Specific Question:   Supervising Provider    Answer:   Beatrice Lecher D [[id number]]    Medications Discontinued During This Encounter  Medication Reason   amphetamine-dextroamphetamine (ADDERALL) 10 MG tablet Reorder       Time spent on telephone encounter was 8 minutes.    The [place] was signed into law in 2016 which includes the topic of electronic health records.  This provides immediate access to information in MyChart.  This includes consultation notes, operative notes, office notes, lab results and pathology reports.  If you have any questions about what you read please let us know at your next visit or call us at the office.  We are right here with you.   __________________________________________________________________________________     Patient Care Team    Relationship Specialty Notifications Start End  Denise Wolfe, [place] PCP - General   12/13/19   Silverio Decamp, MD  Consulting Physician Family Medicine  07/25/16    Comment: OA - shoulder injeciton  Truitt Merle, MD Consulting Physician Hematology  51/76/16   Leighton Ruff, MD Consulting Physician General Surgery  07/23/17      -Vitals obtained; medications/ allergies reconciled;  personal medical, social, Sx etc.histories were updated by CMA, reviewed by me and are reflected in chart   Patient Active Problem List   Diagnosis Date Noted   Chronic right shoulder pain 08/18/2021   Dizziness on standing 08/13/2019   B12 deficiency 08/13/2019   Personal history of radiation therapy 09/30/2018   Hx antineoplastic chemo 09/30/2018   Anemia due to antineoplastic chemotherapy 05/14/2017   Pneumonia of left lower lobe due to infectious organism 05/09/2017   Difficulty sleeping 05/09/2017   Burning sensation- scalp esp at night 05/09/2017   Wheeze 05/09/2017   Chemotherapy induced neutropenia ([place]) 04/14/2017   Squamous cell carcinoma of anal canal ([place]) 04/11/2017  Squamous cell carcinoma of rectum ([place]) 02/14/2017   Colon cancer ([place]) 02/11/2017   Attention deficit disorder 12/27/2016   Multiple atypical nevi 12/27/2016   Nonspecific chest pain 08/08/2016   Mixed disorder as reaction to stress 08/08/2016   Vitamin D insufficiency 07/19/2016   HLD (hyperlipidemia) 07/19/2016   Retrosternal chest pain 07/19/2016   Degenerative joint disease (DJD) of sternoclavicular joint, right 07/19/2016   Elevated HDL:  >er 90 07/19/2016   History of breast Cancer 05/10/2016   Depression, recurrent ([place]) 05/10/2016   Cataract 05/10/2016   Pain in joint, shoulder region 05/10/2016   HTN (hypertension), benign 05/10/2016   Breast cancer ([place]) 03/13/2000     No outpatient medications have been marked as taking for the 08/29/21 encounter (Video Visit) with Denise Reid, PA-C.   Current Facility-Administered Medications for the 08/29/21 encounter (Video Visit) with Denise Reid, PA-C  Medication   0.9 %   sodium chloride infusion     Allergies:  Allergies  Allergen Reactions   Ibuprofen Rash     ROS:  See above HPI for pertinent positives and negatives   Objective:   Blood pressure 124/86, height 5\' 2"  (1.575 m), weight 136 lb (61.7 kg).  (if some vitals are omitted, this means that patient was UNABLE to obtain them.) General: A & O * 3; sounds in no acute distress Respiratory: speaking in full sentences, no conversational dyspnea Psych: insight appears good, mood- appears full

## 2021-08-29 NOTE — Patient Instructions (Signed)
Living With Attention Deficit Hyperactivity Disorder If you have been diagnosed with attention deficit hyperactivity disorder (ADHD), you may be relieved that you now know why you have felt or behaved a certain way. Still, you may feel overwhelmed about the treatment ahead. You may also wonder how to get the support you need and how to deal with the condition day-to-day. With treatment and support, you can live with ADHD and manage your symptoms. How to manage lifestyle changes Managing stress Stress is your body's reaction to life changes and events, both good and bad. To cope with the stress of an ADHD diagnosis, it may help to: Learn more about ADHD. Exercise regularly. Even a short daily walk can lower stress levels. Participate in training or education programs (including social skills training classes) that teach you to deal with symptoms.  Medicines Your health care provider may suggest certain medicines if he or she feels that they will help to improve your condition. Stimulant medicines are usually prescribed to treat ADHD, and therapy may also be prescribed. It is important to: Avoid using alcohol and other substances that may prevent your medicines from working properly (may interact). Talk with your pharmacist or health care provider about all the medicines that you take, their possible side effects, and what medicines are safe to take together. Make it your goal to take part in all treatment decisions (shared decision-making). Ask about possible side effects of medicines that your health care provider recommends, and tell him or her how you feel about having those side effects. It is best if shared decision-making with your health care provider is part of your total treatment plan. Relationships To strengthen your relationships with family members while treating your condition, consider taking part in family therapy. You might also attend self-help groups alone or with a loved one. Be  honest about how your symptoms affect your relationships. Make an effort to communicate respectfully instead of fighting, and find ways to show others that you care. Psychotherapy may be useful in helping you cope with how ADHD affects your relationships. How to recognize changes in your condition The following signs may mean that your treatment is working well and your condition is improving: Consistently being on time for appointments. Being more organized at home and work. Other people noticing improvements in your behavior. Achieving goals that you set for yourself. Thinking more clearly. The following signs may mean that your treatment is not working very well: Feeling impatience or more confusion. Missing, forgetting, or being late for appointments. An increasing sense of disorganization and messiness. More difficulty in reaching goals that you set for yourself. Loved ones becoming angry or frustrated with you. Follow these instructions at home: Take over-the-counter and prescription medicines only as told by your health care provider. Check with your health care provider before taking any new medicines. Create structure and an organized atmosphere at home. For example: Make a list of tasks, then rank them from most important to least important. Work on one task at a time until your listed tasks are done. Make a daily schedule and follow it consistently every day. Use an appointment calendar, and check it 2 or 3 times a day to keep on track. Keep it with you when you leave the house. Create spaces where you keep certain things, and always put things back in their places after you use them. Keep all follow-up visits as told by your health care provider. This is important. Where to find support Talking to others    Keep emotion out of important discussions and speak in a calm, logical way. Listen closely and patiently to your loved ones. Try to understand their point of view, and try to  avoid getting defensive. Take responsibility for the consequences of your actions. Ask that others do not take your behaviors personally. Aim to solve problems as they come up, and express your feelings instead of bottling them up. Talk openly about what you need from your loved ones and how they can support you. Consider going to family therapy sessions or having your family meet with a specialist who deals with ADHD-related behavior problems. Finances Not all insurance plans cover mental health care, so it is important to check with your insurance carrier. If paying for co-pays or counseling services is a problem, search for a local or county mental health care center. Public mental health care services may be offered there at a low cost or no cost when you are not able to see a private health care provider. If you are taking medicine for ADHD, you may be able to get the generic form, which may be less expensive than brand-name medicine. Some makers of prescription medicines also offer help to patients who cannot afford the medicines that they need. Questions to ask your health care provider: What are the risks and benefits of taking medicines? Would I benefit from therapy? How often should I follow up with a health care provider? Contact a health care provider if: You have side effects from your medicines, such as: Repeated muscle twitches, coughing, or speech outbursts. Sleep problems. Loss of appetite. Depression. New or worsening behavior problems. Dizziness. Unusually fast heartbeat. Stomach pains. Headaches. Get help right away if: You have a severe reaction to a medicine. Your behavior suddenly gets worse. Summary With treatment and support, you can live with ADHD and manage your symptoms. The medicines that are most often prescribed for ADHD are stimulants. Consider taking part in family therapy or self-help groups with family members or friends. When you talk with friends  and family about your ADHD, be patient and communicate openly. Take over-the-counter and prescription medicines only as told by your health care provider. Check with your health care provider before taking any new medicines. This information is not intended to replace advice given to you by your health care provider. Make sure you discuss any questions you have with your health care provider. Document Revised: 01/13/2020 Document Reviewed: 01/13/2020 Elsevier Patient Education  2022 Elsevier Inc.  

## 2021-08-31 DIAGNOSIS — Z20822 Contact with and (suspected) exposure to covid-19: Secondary | ICD-10-CM | POA: Diagnosis not present

## 2021-11-13 ENCOUNTER — Other Ambulatory Visit: Payer: Self-pay

## 2021-11-13 ENCOUNTER — Emergency Department (HOSPITAL_COMMUNITY): Payer: Medicare Other

## 2021-11-13 ENCOUNTER — Emergency Department (HOSPITAL_COMMUNITY)
Admission: EM | Admit: 2021-11-13 | Discharge: 2021-11-14 | Disposition: A | Discharge status: Home / Self Care | Payer: Medicare Other | Attending: Emergency Medicine | Admitting: Emergency Medicine

## 2021-11-13 DIAGNOSIS — R42 Dizziness and giddiness: Secondary | ICD-10-CM | POA: Insufficient documentation

## 2021-11-13 DIAGNOSIS — I6503 Occlusion and stenosis of bilateral vertebral arteries: Secondary | ICD-10-CM | POA: Diagnosis not present

## 2021-11-13 DIAGNOSIS — R2689 Other abnormalities of gait and mobility: Secondary | ICD-10-CM

## 2021-11-13 DIAGNOSIS — I6782 Cerebral ischemia: Secondary | ICD-10-CM | POA: Diagnosis not present

## 2021-11-13 DIAGNOSIS — R269 Unspecified abnormalities of gait and mobility: Secondary | ICD-10-CM | POA: Diagnosis not present

## 2021-11-13 DIAGNOSIS — I639 Cerebral infarction, unspecified: Secondary | ICD-10-CM | POA: Diagnosis not present

## 2021-11-13 DIAGNOSIS — I6523 Occlusion and stenosis of bilateral carotid arteries: Secondary | ICD-10-CM | POA: Diagnosis not present

## 2021-11-13 DIAGNOSIS — I10 Essential (primary) hypertension: Secondary | ICD-10-CM

## 2021-11-13 DIAGNOSIS — R519 Headache, unspecified: Secondary | ICD-10-CM | POA: Insufficient documentation

## 2021-11-13 LAB — CBC WITH DIFFERENTIAL/PLATELET
Abs Immature Granulocytes: 0 10*3/uL (ref 0.00–0.07)
Basophils Absolute: 0 10*3/uL (ref 0.0–0.1)
Basophils Relative: 1 %
Eosinophils Absolute: 0.1 10*3/uL (ref 0.0–0.5)
Eosinophils Relative: 4 %
HCT: 41.9 % (ref 36.0–46.0)
Hemoglobin: 14.2 g/dL (ref 12.0–15.0)
Immature Granulocytes: 0 %
Lymphocytes Relative: 25 %
Lymphs Abs: 0.8 10*3/uL (ref 0.7–4.0)
MCH: 30.8 pg (ref 26.0–34.0)
MCHC: 33.9 g/dL (ref 30.0–36.0)
MCV: 90.9 fL (ref 80.0–100.0)
Monocytes Absolute: 0.5 10*3/uL (ref 0.1–1.0)
Monocytes Relative: 15 %
Neutro Abs: 1.8 10*3/uL (ref 1.7–7.7)
Neutrophils Relative %: 55 %
Platelets: 319 10*3/uL (ref 150–400)
RBC: 4.61 MIL/uL (ref 3.87–5.11)
RDW: 12.3 % (ref 11.5–15.5)
WBC: 3.3 10*3/uL — ABNORMAL LOW (ref 4.0–10.5)
nRBC: 0 % (ref 0.0–0.2)

## 2021-11-13 LAB — COMPREHENSIVE METABOLIC PANEL
ALT: 16 U/L (ref 0–44)
AST: 18 U/L (ref 15–41)
Albumin: 4.3 g/dL (ref 3.5–5.0)
Alkaline Phosphatase: 34 U/L — ABNORMAL LOW (ref 38–126)
Anion gap: 9 (ref 5–15)
BUN: 15 mg/dL (ref 8–23)
CO2: 28 mmol/L (ref 22–32)
Calcium: 9.7 mg/dL (ref 8.9–10.3)
Chloride: 101 mmol/L (ref 98–111)
Creatinine, Ser: 0.78 mg/dL (ref 0.44–1.00)
GFR, Estimated: >60 mL/min (ref >60)
Glucose, Bld: 94 mg/dL (ref 70–99)
Potassium: 3.6 mmol/L (ref 3.5–5.1)
Sodium: 138 mmol/L (ref 135–145)
Total Bilirubin: 0.6 mg/dL (ref 0.3–1.2)
Total Protein: 7 g/dL (ref 6.5–8.1)

## 2021-11-13 NOTE — ED Triage Notes (Signed)
Pt sent here by [facility] for further work up of frontal HA, feeling "off-balance" and slightly nauseous. Pt's BP was initially high at home and at [facility]. Pt denies "dizziness" but reports difficulty walking. Symptoms started Saturday morning ?

## 2021-11-13 NOTE — ED Provider Triage Note (Signed)
Emergency Medicine Provider Triage Evaluation Note ? ?Denise Wolfe , a 81 y.o. female  was evaluated in triage.  Pt complains of off balance since Saturday. She reports trouble walking and "feels like she is on a ship". Feels pressure in the front of her head. Denies any lightheadedness or syncope. Denies any photophobia, blurry vision, or tinnitus. She reports she has had an episode liek this before around 2 years ago and had an MRI but doesn't remember what they told her it was.  ? ?H/o breast cancer and rectal cancer both in remission ? ?Review of Systems  ?Positive: See above  ?Negative: See above ? ?Physical Exam  ?BP (!) 163/93   Pulse 83   Temp (!) 97.4 ?F (36.3 ?C) (Oral)   Resp 16   SpO2 98%  ?Gen:   Awake, no distress   ?Resp:  Normal effort  ?MSK:   Moves extremities without difficulty  ?Other:  Cranial nerves II-XII intact. Equal strength in bilateral upper and lower extremities. ? ?Medical Decision Making  ?Medically screening exam initiated at 5:56 PM.  Appropriate orders placed.  Denise Wolfe was informed that the remainder of the evaluation will be completed by another provider, this initial triage assessment does not replace that evaluation, and the importance of remaining in the ED until their evaluation is complete. ? ?Labs and imaging ordered. Benign neuro exam.  ?  ?Sherrell Puller, PA-C ?11/13/21 1800 ? ?

## 2021-11-14 ENCOUNTER — Emergency Department (HOSPITAL_COMMUNITY): Payer: Medicare Other

## 2021-11-14 DIAGNOSIS — I6523 Occlusion and stenosis of bilateral carotid arteries: Secondary | ICD-10-CM | POA: Diagnosis not present

## 2021-11-14 DIAGNOSIS — I6503 Occlusion and stenosis of bilateral vertebral arteries: Secondary | ICD-10-CM | POA: Diagnosis not present

## 2021-11-14 DIAGNOSIS — R42 Dizziness and giddiness: Secondary | ICD-10-CM | POA: Diagnosis not present

## 2021-11-14 DIAGNOSIS — I6782 Cerebral ischemia: Secondary | ICD-10-CM | POA: Diagnosis not present

## 2021-11-14 DIAGNOSIS — R2689 Other abnormalities of gait and mobility: Secondary | ICD-10-CM | POA: Diagnosis not present

## 2021-11-14 DIAGNOSIS — I639 Cerebral infarction, unspecified: Secondary | ICD-10-CM | POA: Diagnosis not present

## 2021-11-14 MED ORDER — IOHEXOL 350 MG/ML SOLN
75.0000 mL | Freq: Once | INTRAVENOUS | Status: AC | PRN
  Administered 2021-11-14: 75 mL via INTRAVENOUS

## 2021-11-14 NOTE — ED Notes (Signed)
Pt ambulated in hallway with steady gait. Denies dizziness and being nauseated  ?

## 2021-11-14 NOTE — ED Notes (Signed)
Patient transported to CT 

## 2021-11-14 NOTE — ED Provider Notes (Signed)
?[facility] ?Provider Note ? ? ?CSN: [id number] ?Arrival date & time: 11/13/21  1731 ? ?  ? ?History ? ?Chief Complaint  ?Patient presents with  ? Dizziness  ? Headache  ? ? ?Denise Wolfe is a 81 y.o. female. ? ?Patient presents to the emergency department for evaluation of headache and dizziness.  Patient reports that symptoms were present upon awakening from sleep 2 mornings ago.  She tried to stand up, felt very off balance and had to sit back down to prevent falling.  Patiently specifically states that she does not feel like she is spinning, she is off balance and feels like she is drunk. ? ? ?  ? ?Home Medications ?Prior to Admission medications   ?Medication Sig Start Date End Date Taking? Authorizing Provider  ?amphetamine-dextroamphetamine (ADDERALL) 10 MG tablet Take 1 tablet (10 mg total) by mouth every morning. 08/29/21 11/27/21 Yes Abonza, Maritza, PA-C  ?atorvastatin (LIPITOR) 80 MG tablet TAKE ONE-HALF TABLET BY  MOUTH DAILY ?Patient taking differently: Take 40 mg by mouth at bedtime. 01/02/21  Yes Lorrene Reid, PA-C  ?cholecalciferol (VITAMIN D) 400 units TABS tablet Take 400 Units by mouth daily. Patient is not sure of the units.   Yes [provider]  ?fluticasone (FLONASE) 50 MCG/ACT nasal spray 1 spray each nostril following sinus rinses twice daily ?Patient taking differently: Place 1 spray into both nostrils daily as needed for allergies. 05/25/20  Yes Abonza, Maritza, PA-C  ?hydrochlorothiazide (MICROZIDE) 12.5 MG capsule TAKE 1 CAPSULE BY MOUTH  DAILY ?Patient taking differently: Take 12.5 mg by mouth daily. 06/26/21  Yes Abonza, Maritza, PA-C  ?losartan (COZAAR) 50 MG tablet TAKE 1 TABLET BY MOUTH  DAILY ?Patient taking differently: Take 50 mg by mouth daily. 06/26/21  Yes Lorrene Reid, PA-C  ?sertraline (ZOLOFT) 50 MG tablet Take 1.5 tablets (75 mg total) by mouth daily. 06/02/21  Yes Lorrene Reid, PA-C  ?vitamin B-12 (CYANOCOBALAMIN) 500  MCG tablet Take 1 tablet (500 mcg total) by mouth daily. 08/13/19  Yes [name], DO  ?   ? ?Allergies    ?Ibuprofen   ? ?Review of Systems   ?Review of Systems  ?Neurological:  Positive for dizziness and headaches.  ? ?Physical Exam ?Updated Vital Signs ?BP (!) 151/63   Pulse 63   Temp (!) 97.4 ?F (36.3 ?C) (Oral)   Resp 19   SpO2 99%  ?Physical Exam ?Vitals and nursing note reviewed.  ?Constitutional:   ?   General: She is not in acute distress. ?   Appearance: She is well-developed.  ?HENT:  ?   Head: Normocephalic and atraumatic.  ?   Mouth/Throat:  ?   Mouth: Mucous membranes are moist.  ?Eyes:  ?   General: Vision grossly intact. Gaze aligned appropriately.  ?   Extraocular Movements: Extraocular movements intact.  ?   Conjunctiva/sclera: Conjunctivae normal.  ?Cardiovascular:  ?   Rate and Rhythm: Normal rate and regular rhythm.  ?   Pulses: Normal pulses.  ?   Heart sounds: Normal heart sounds, S1 normal and S2 normal. No murmur heard. ?  No friction rub. No gallop.  ?Pulmonary:  ?   Effort: Pulmonary effort is normal. No respiratory distress.  ?   Breath sounds: Normal breath sounds.  ?Abdominal:  ?   General: Bowel sounds are normal.  ?   Palpations: Abdomen is soft.  ?   Tenderness: There is no abdominal tenderness. There is no guarding or rebound.  ?  Hernia: No hernia is present.  ?Musculoskeletal:     ?   General: No swelling.  ?   Cervical back: Full passive range of motion without pain, normal range of motion and neck supple. No spinous process tenderness or muscular tenderness. Normal range of motion.  ?   Right lower leg: No edema.  ?   Left lower leg: No edema.  ?Skin: ?   General: Skin is warm and dry.  ?   Capillary Refill: Capillary refill takes less than 2 seconds.  ?   Findings: No ecchymosis, erythema, rash or wound.  ?Neurological:  ?   General: No focal deficit present.  ?   Mental Status: She is alert and oriented to person, place, and time.  ?   GCS: GCS eye subscore is 4.  GCS verbal subscore is 5. GCS motor subscore is 6.  ?   Cranial Nerves: Cranial nerves 2-12 are intact.  ?   Sensory: Sensation is intact.  ?   Motor: Motor function is intact.  ?   Coordination: Coordination is intact.  ?   Comments: Extraocular muscle movement: normal ?No visual field cut ?Pupils: equal and reactive both direct and consensual response is normal ?No nystagmus present ?   ?Sensory function is intact to light touch, pinprick ?Proprioception intact ? ?Grip strength 5/5 symmetric in upper extremities ?No pronator drift ?Normal finger to nose bilaterally ? ?Lower extremity strength 5/5 against gravity ?Normal heel to shin bilaterally ? ?  ?Psychiatric:     ?   Attention and Perception: Attention normal.     ?   Mood and Affect: Mood normal.     ?   Speech: Speech normal.     ?   Behavior: Behavior normal.  ? ? ?ED Results / Procedures / Treatments   ?Labs ?(all labs ordered are listed, but only abnormal results are displayed) ?Labs Reviewed  ?COMPREHENSIVE METABOLIC PANEL - Abnormal; Notable for the following components:  ?    Result Value  ? Alkaline Phosphatase 34 (*)   ? All other components within normal limits  ?CBC WITH DIFFERENTIAL/PLATELET - Abnormal; Notable for the following components:  ? WBC 3.3 (*)   ? All other components within normal limits  ? ? ?EKG ?None ? ?Radiology ?CT ANGIO HEAD NECK W WO CM ? ?Result Date: 11/14/2021 ?CLINICAL DATA:  Sudden onset headache and dizziness EXAM: CT ANGIOGRAPHY HEAD AND NECK TECHNIQUE: Multidetector CT imaging of the head and neck was performed using the standard protocol during bolus administration of intravenous contrast. Multiplanar CT image reconstructions and MIPs were obtained to evaluate the vascular anatomy. Carotid stenosis measurements (when applicable) are obtained utilizing NASCET criteria, using the distal internal carotid diameter as the denominator. RADIATION DOSE REDUCTION: This exam was performed according to the departmental  dose-optimization program which includes automated exposure control, adjustment of the mA and/or kV according to patient size and/or use of iterative reconstruction technique. CONTRAST:  22m OMNIPAQUE IOHEXOL 350 MG/ML SOLN COMPARISON:  Head CT 11/13/2021 FINDINGS: CTA NECK FINDINGS SKELETON: There is no bony spinal canal stenosis. No lytic or blastic lesion. OTHER NECK: Normal pharynx, larynx and major salivary glands. No cervical lymphadenopathy. Unremarkable thyroid gland. UPPER CHEST: Left apical scarring. AORTIC ARCH: There is calcific atherosclerosis of the aortic arch. There is no aneurysm, dissection or hemodynamically significant stenosis of the visualized portion of the aorta. Normal variant aortic arch branching pattern with the left vertebral artery arising independently from the aortic arch. The visualized proximal  subclavian arteries are widely patent. RIGHT CAROTID SYSTEM: No dissection, occlusion or aneurysm. Mild atherosclerotic calcification at the carotid bifurcation without hemodynamically significant stenosis. LEFT CAROTID SYSTEM: No dissection, occlusion or aneurysm. Mild atherosclerotic calcification at the carotid bifurcation without hemodynamically significant stenosis. VERTEBRAL ARTERIES: Right dominant configuration. There is severe stenosis of the left vertebral artery origin with possible short segment occlusion. The remainder of the left vertebral artery is normal. Right vertebral artery is normal along its entire course. There is no dissection, occlusion or flow-limiting stenosis to the skull base (V1-V3 segments). CTA HEAD FINDINGS POSTERIOR CIRCULATION: --Vertebral arteries: Normal V4 segments. --Inferior cerebellar arteries: Normal. --Basilar artery: Normal. --Superior cerebellar arteries: Normal. --Posterior cerebral arteries (PCA): Normal. ANTERIOR CIRCULATION: --Intracranial internal carotid arteries: Normal. --Anterior cerebral arteries (ACA): Normal. Both A1 segments are  present. Patent anterior communicating artery (a-comm). --Middle cerebral arteries (MCA): Normal. VENOUS SINUSES: As permitted by contrast timing, patent. ANATOMIC VARIANTS: None Review of the MIP images confirms the above f

## 2021-11-22 NOTE — Progress Notes (Signed)
? ?Established Patient Office Visit ? ?Subjective:  ?Patient ID: Denise Wolfe, female    DOB: 01/24/1941  Age: 81 y.o. MRN: [id number] ? ?CC:  ?Chief Complaint  ?Patient presents with  ? Hospitalization Follow-up  ? ? ?HPI ?Denise Wolfe presents for ED follow-up. Patient reports continues to have intermittent unsteadiness with walking and sometimes with position changes. Denies falls. States had similar symptoms few years ago and was referred to physical therapy. Denies spinning sensation. No new symptoms. Denies starting new medications or supplements. Her blood pressure a few days ago was 124/60. Has not been checking it daily. Requesting refill of Flonase. ? ? ?ED HPI: ?Patient presents to the emergency department for evaluation of headache and dizziness.  Patient reports that symptoms were present upon awakening from sleep 2 mornings ago.  She tried to stand up, felt very off balance and had to sit back down to prevent falling.  Patiently specifically states that she does not feel like she is spinning, she is off balance and feels like she is drunk. ? ?Past Medical History:  ?Diagnosis Date  ? Anemia   ? during childhood  ? Arthritis   ? hands, shoulder  ? Breast cancer ([place]) 03/2000  ? left side modified mastecomy  ? Cancer [facility])   ? breast, age 81; left side  ? Colon cancer ([place]) 02/11/2017  ? rectum-squamous cell carcinoma  ? Depression   ? Hypertension   ? Personal history of chemotherapy   ? Personal history of radiation therapy   ? Squamous cell carcinoma of rectum ([place]) 02/14/2017  ? ? ?Past Surgical History:  ?Procedure Laterality Date  ? ABDOMINAL HYSTERECTOMY    ? With bilateral oophorectomy. 2002.  ? BREAST BIOPSY Right 2004  ? benign  ? BREAST SURGERY Left 2001  ? biopsy  ? CATARACT EXTRACTION Bilateral 2017  ? IR FLUORO GUIDE CV LINE RIGHT  03/11/2017  ? IR FLUORO GUIDE CV LINE RIGHT  04/16/2017  ? IR US GUIDE VASC ACCESS RIGHT  03/11/2017  ? IR US GUIDE VASC ACCESS RIGHT  04/16/2017  ? MASTECTOMY MODIFIED  RADICAL Left 2001  ? MENISCUS DEBRIDEMENT Right 2003  ? TUMOR EXCISION Right   ? thigh area  ? ? ?Family History  ?Problem Relation Age of Onset  ? Cancer Mother 34  ?     breast  ? Breast cancer Mother   ? Heart disease Father   ? Colon cancer Neg Hx   ? Esophageal cancer Neg Hx   ? Rectal cancer Neg Hx   ? Stomach cancer Neg Hx   ? ? ?Social History  ? ?Socioeconomic History  ? Marital status: Widowed  ?  Spouse name: Not on file  ? Number of children: Not on file  ? Years of education: Not on file  ? Highest education level: Not on file  ?Occupational History  ? Not on file  ?Tobacco Use  ? Smoking status: Never  ? Smokeless tobacco: Never  ?Substance and Sexual Activity  ? Alcohol use: Yes  ?  Alcohol/week: 14.0 standard drinks  ?  Types: 14 Glasses of wine per week  ? Drug use: No  ? Sexual activity: Not Currently  ?Other Topics Concern  ? Not on file  ?Social History Narrative  ? Not on file  ? ?Social Determinants of Health  ? ?Financial Resource Strain: Not on file  ?Food Insecurity: Not on file  ?Transportation Needs: Not on file  ?Physical Activity: Not on file  ?Stress: Not  on file  ?Social Connections: Not on file  ?Intimate Partner Violence: Not on file  ? ? ?Outpatient Medications Prior to Visit  ?Medication Sig Dispense Refill  ? amphetamine-dextroamphetamine (ADDERALL) 10 MG tablet Take 1 tablet (10 mg total) by mouth every morning. 90 tablet 0  ? atorvastatin (LIPITOR) 80 MG tablet TAKE ONE-HALF TABLET BY  MOUTH DAILY (Patient taking differently: Take 40 mg by mouth at bedtime.) 45 tablet 3  ? cholecalciferol (VITAMIN D) 400 units TABS tablet Take 400 Units by mouth daily. Patient is not sure of the units.    ? hydrochlorothiazide (MICROZIDE) 12.5 MG capsule TAKE 1 CAPSULE BY MOUTH  DAILY (Patient taking differently: Take 12.5 mg by mouth daily.) 90 capsule 1  ? losartan (COZAAR) 50 MG tablet TAKE 1 TABLET BY MOUTH  DAILY (Patient taking differently: Take 50 mg by mouth daily.) 90 tablet 1  ?  sertraline (ZOLOFT) 50 MG tablet Take 1.5 tablets (75 mg total) by mouth daily. 135 tablet 1  ? vitamin B-12 (CYANOCOBALAMIN) 500 MCG tablet Take 1 tablet (500 mcg total) by mouth daily.    ? fluticasone (FLONASE) 50 MCG/ACT nasal spray 1 spray each nostril following sinus rinses twice daily (Patient taking differently: Place 1 spray into both nostrils daily as needed for allergies.) 16 g 5  ? ?Facility-Administered Medications Prior to Visit  ?Medication Dose Route Frequency Provider Last Rate Last Admin  ? 0.9 %  sodium chloride infusion  500 mL Intravenous Continuous [name], MD      ? ? ?Allergies  ?Allergen Reactions  ? Ibuprofen Rash  ? ? ?ROS ?Review of Systems ?Review of Systems:  ?A fourteen system review of systems was performed and found to be positive as per HPI. ? ?  ?Objective:  ?  ?Physical Exam ?General:  Well Developed, well nourished, appropriate for stated age.  ?Neuro:  Alert and oriented,  extra-ocular muscles intact, Dix-Hallpike- slight nystagmus (torsional) noted with head 45 degrees to the right and became dizzy upon sitting upright, no nystagmus noted to the left and pt did not become dizzy ?HEENT:  Normocephalic, atraumatic, PERRL, normal TM's of both ears, neck supple, no adenopathy  ?Skin:  no gross rash, warm, pink. ?Cardiac:  RRR, S1 S2 ?Respiratory: CTA B/L  ?Vascular:  Ext warm, no cyanosis apprec.; cap RF less 2 sec. ?Psych:  No HI/SI, judgement and insight good, Euthymic mood. Full Affect. ? ?BP 118/70   Pulse 88   Temp 99.6 ?F (37.6 ?C)   Ht _0  (1.575 m)   Wt 124 lb 6.4 oz (56.4 kg)   SpO2 95%   BMI 22.75 kg/m?  ?Wt Readings from Last 3 Encounters:  ?11/23/21 124 lb 6.4 oz (56.4 kg)  ?08/29/21 136 lb (61.7 kg)  ?08/18/21 135 lb (61.2 kg)  ? ? ? ?Health Maintenance Due  ?Topic Date Due  ? Hepatitis C Screening  Never done  ? Zoster Vaccines- Shingrix (1 of 2) Never done  ? TETANUS/TDAP  05/17/2014  ? COVID-19 Vaccine (5 - Booster for Pfizer series) 05/01/2021   ? ? ?There are no preventive care reminders to display for this patient. ? ?Lab Results  ?Component Value Date  ? TSH 1.950 11/17/2020  ? ?Lab Results  ?Component Value Date  ? WBC 3.3 (L) 11/13/2021  ? HGB 14.2 11/13/2021  ? HCT 41.9 11/13/2021  ? MCV 90.9 11/13/2021  ? PLT 319 11/13/2021  ? ?Lab Results  ?Component Value Date  ? NA 138 11/13/2021  ?  K 3.6 11/13/2021  ? CHLORIDE 105 06/11/2017  ? CO2 28 11/13/2021  ? GLUCOSE 94 11/13/2021  ? BUN 15 11/13/2021  ? CREATININE 0.78 11/13/2021  ? BILITOT 0.6 11/13/2021  ? ALKPHOS 34 (L) 11/13/2021  ? AST 18 11/13/2021  ? ALT 16 11/13/2021  ? PROT 7.0 11/13/2021  ? ALBUMIN 4.3 11/13/2021  ? CALCIUM 9.7 11/13/2021  ? ANIONGAP 9 11/13/2021  ? EGFR 77 11/17/2020  ? GFR 74.49 02/15/2017  ? ?Lab Results  ?Component Value Date  ? CHOL 189 11/17/2020  ? ?Lab Results  ?Component Value Date  ? HDL 79 11/17/2020  ? ?Lab Results  ?Component Value Date  ? [place] 99 11/17/2020  ? ?Lab Results  ?Component Value Date  ? TRIG 57 11/17/2020  ? ?Lab Results  ?Component Value Date  ? CHOLHDL 2.4 11/17/2020  ? ?Lab Results  ?Component Value Date  ? HGBA1C 5.1 07/11/2016  ? ? ?  ?Assessment & Plan:  ? ?Problem List Items Addressed This Visit   ? ?  ? Cardiovascular and Mediastinum  ? HTN (hypertension), benign (Chronic)  ? ?Other Visit Diagnoses   ? ? Unsteadiness on feet    -  Primary  ? Environmental and seasonal allergies      ? Relevant Medications  ? fluticasone (FLONASE) 50 MCG/ACT nasal spray  ? ?  ? ?Unsteadiness on feet: ?-Reviewed ED note, labs and imaging.  ?-MR brain w/o contrast:IMPRESSION: ?1. No acute finding. ?2. Mild chronic small vessel ischemia and small remote left ?cerebellar infarct. ?-CT angio head neck w w/o CM:IMPRESSION: ?1. No intracranial arterial occlusion or high-grade stenosis. ?2. Severe stenosis of the left vertebral artery origin with short ?segment occlusion. ?-Discussed with patient likely BPPV. Patient prefers to do exercises at home vs vestibular  rehabilitation with PT. Declined medication therapy. Provided handout. If symptoms fail to improve or worsen recommend referral to PT.  ? ?Hypertension: ?-BP elevated on take, repeated and improved. Stable. ?-Continue [place]

## 2021-11-23 ENCOUNTER — Encounter: Payer: Self-pay | Admitting: Physician Assistant

## 2021-11-23 ENCOUNTER — Ambulatory Visit (INDEPENDENT_AMBULATORY_CARE_PROVIDER_SITE_OTHER): Payer: Medicare Other | Admitting: Physician Assistant

## 2021-11-23 VITALS — BP 118/70 | HR 88 | Temp 99.6°F | Ht 62.0 in | Wt 124.4 lb

## 2021-11-23 DIAGNOSIS — I10 Essential (primary) hypertension: Secondary | ICD-10-CM

## 2021-11-23 DIAGNOSIS — J3089 Other allergic rhinitis: Secondary | ICD-10-CM

## 2021-11-23 DIAGNOSIS — R2681 Unsteadiness on feet: Secondary | ICD-10-CM | POA: Diagnosis not present

## 2021-11-23 MED ORDER — FLUTICASONE PROPIONATE 50 MCG/ACT NA SUSP: 1.0000 | Freq: Every day | NASAL | 1 refills | Status: DC | PRN

## 2021-11-23 NOTE — Patient Instructions (Signed)
How to Treat Vertigo at Home with Exercises ? ?What is Vertigo? ? ?Vertigo is a relatively common symptom most often associated with conditions such as sinusitis (inflammation of your sinuses due to viruses, allergies, or bacterial infections), or an inner ear infection or ear trauma.   It can be brought on by trauma (e.g. a blow to the head or whiplash) or more serious things like minor strokes.   Symptoms can also be brought on by normal degenerative changes to your inner ear that occur with aging.  The condition tends to be more commonly seen in the elderly but it can occur in all ages.   ? ?Patients most often complain of dizziness, as if the room is spinning around them.   Symptoms are provoked by quick head movements or changes in position like going from standing to lying in bed, or even turning over in bed.   It may present with nausea and/or vomiting, and can be very debilitating to some folks.   ? ?By far the most common cause, known as Benign Paroxysmal Positional Vertigo (BPPV), is categorized by a sudden onset of symptoms, that are intense but short-lived (60 seconds or less), which is triggered by a change in head position.   Symptoms usually dissipate if you stay in one position and do not move your head.   Within the inner ear are collections of calcium carbonate crystals referred to as ?otoliths? which may become dislodged from their normal position and migrate into the semicircular canals of the inner ear, throwing off your body's ability to sense where you are in space.   ? ? ?Fig. 921 Anatomy of the Right Osseous Labyrinth. Antonieta Iba. Anatomy of the Human Body. 1918. ? ?  ? ?  ? ?  ? ? ?What Else Could Be Behind My Vertigo? ? ?Some other causes of vertigo include: ? ?Meniere?s disease (disorder of inner ear with ringing in ears, feeling of fullness/pressure within ear, and fluctuating hearing loss) ?Tumours ?Neurological disorders e.g. Multiple Sclerosis ?Motion Sickness (lack of coordination  between visual stimuli, inner ear balance and positional sense) ?Migraine ?Labyrinthitis (inflammation of the fluid-filled tubes and sacs within the inner ear; may also be associated with changes in hearing) ?Vestibular neuritis (inflammation of the nerves associated with transmission of sensory info from the inner ear; usually of viral origins) ? ?How it can be treated/cured? ?While certain medications have been prescribed for vertigo including Lorazepam your doing well 7 house the house going organizing and getting things ready for sale with the and Meclizine (for motion sickness), there exists no evidence to support a recommendation of any medication in the routine treatment of BPPV.  Clinical trials have demonstrated that repositioning techniques (listed below) are a superior option for management Otis Dials et al., 2008). ? ? ? ?Figure above:  (A) Instructions for the modified Epley procedure (MEP) for left ear posterior canal benign paroxysmal positional vertigo (PC-BPPV). For right ear BPPV, the procedure has to be performed in the opposite direction, starting with the head turned to the right side.  ?1. Start by sitting on a bed with your head turned 45? to the left. Place a pillow behind you so that on lying back it will be under your shoulders.  ?2. Lie back quickly with shoulders on the pillow, neck extended, and head resting on the bed. In this position, the affected (left) ear is underneath. Wait for 30 secondS.  ?3. Turn your head 90? to the right (without raising it), and  wait again for 30 seconds.  ?4. Turn your body and head another 90? to the right, and wait for another 30 seconds.  ?5. Sit up on the right side. This maneuver should be performed three times a day. Repeat this daily until you are free from positional vertigo for 24 hours. ? ? (B) Instructions for the modified Semont maneuver (MSM) for left ear PC-BPPV. For right ear BPPV, the maneuver has to be performed in the opposite direction,  starting with the head turned toward the left ear.  ?1. Sit upright on a bed with your head turned 45? toward the right ear.  ?2. Drop quickly to the left side, so that your head touches the bed behind your left ear. Wait 30 seconds.  ?3. Move head and trunk in a swift movement toward the other side without stopping in the upright position, so that your head comes to rest on the right side of your forehead. Wait again for 30 seconds.  ?4. Sit up again.  ?This maneuver should be performed three times a day. Repeat this daily until you are free from positional vertigo symptoms for 24 hours.  ? ?(   See the video in the supplementary material on the NeurologyWeb site; go to http://www.neurology.org/content/63/1/150/F1.expansion.html   ) ? ? ? ? ?You can also try this motion at home as well- ?Self-Treatment of Benign Paroxysmal Positional Vertigo ?Benign Paroxysmal Positioning Vertigo is caused by loose inner ear crystals in the inner ear that migrate while sleeping to the back-bottom inner ear balance canal, the so-called ?posterior semi-circular canal.? The maneuver demonstrated below is the way to reposition the loose crystals so that the symptoms caused by the loose crystals go away. You may have a floating, swaying sense while walking or sitting for a few days after this procedure.  ? ? ? ? ? ? ? ? ? ? ? ? ?How to Perform the Epley Maneuver ?The Epley maneuver is an exercise that relieves symptoms of vertigo. Vertigo is the feeling that you or your surroundings are moving when they are not. When you feel vertigo, you may feel like the room is spinning and may have trouble walking. The Epley maneuver is used for a type of vertigo caused by a calcium deposit in a part of the inner ear. The maneuver involves changing head positions to help the deposit move out of the area. ?You can do this maneuver at home whenever you have symptoms of vertigo. You can repeat it in 24 hours if your vertigo has not gone away. ?Even  though the Epley maneuver may relieve your vertigo for a few weeks, it is possible that your symptoms will return. This maneuver relieves vertigo, but it does not relieve dizziness. ?What are the risks? ?If it is done correctly, the Epley maneuver is considered safe. Sometimes it can lead to dizziness or nausea that goes away after a short time. If you develop other symptoms--such as changes in vision, weakness, or numbness--stop doing the maneuver and call your health care provider. ?Supplies needed: ?A bed or table. ?A pillow. ?How to do the Epley maneuver ?  ?Sit on the edge of a bed or table with your back straight and your legs extended or hanging over the edge of the bed or table. ?Turn your head halfway toward the affected ear or side as told by your health care provider. ?Lie backward quickly with your head turned until you are lying flat on your back. Your head should dangle (head-hanging  position). You may want to position a pillow under your shoulders. ?Hold this position for at least 30 seconds. If you feel dizzy or have symptoms of vertigo, continue to hold the position until the symptoms stop. ?Turn your head to the opposite direction until your unaffected ear is facing down. Your head should continue to dangle. ?Hold this position for at least 30 seconds. If you feel dizzy or have symptoms of vertigo, continue to hold the position until the symptoms stop. ?Turn your whole body to the same side as your head so that you are positioned on your side. Your head will now be nearly facedown and no longer needs to dangle. Hold for at least 30 seconds. If you feel dizzy or have symptoms of vertigo, continue to hold the position until the symptoms stop. ?Sit back up. ?You can repeat the maneuver in 24 hours if your vertigo does not go away. ?Follow these instructions at home: ?For 24 hours after doing the Epley maneuver: ?Keep your head in an upright position. ?When lying down to sleep or rest, keep your head  raised (elevated) with two or more pillows. ?Avoid excessive neck movements. ?Activity ?Do not drive or use machinery if you feel dizzy. ?After doing the Epley maneuver, return to your normal activities as told b

## 2021-12-06 ENCOUNTER — Ambulatory Visit (INDEPENDENT_AMBULATORY_CARE_PROVIDER_SITE_OTHER): Payer: Medicare Other | Admitting: Physician Assistant

## 2021-12-06 ENCOUNTER — Encounter: Payer: Self-pay | Admitting: Physician Assistant

## 2021-12-06 VITALS — BP 128/64 | HR 68 | Temp 98.3°F | Ht 62.0 in | Wt 131.0 lb

## 2021-12-06 DIAGNOSIS — F988 Other specified behavioral and emotional disorders with onset usually occurring in childhood and adolescence: Secondary | ICD-10-CM | POA: Diagnosis not present

## 2021-12-06 DIAGNOSIS — I10 Essential (primary) hypertension: Secondary | ICD-10-CM | POA: Diagnosis not present

## 2021-12-06 DIAGNOSIS — Z0000 Encounter for general adult medical examination without abnormal findings: Secondary | ICD-10-CM

## 2021-12-06 MED ORDER — AMPHETAMINE-DEXTROAMPHETAMINE 10 MG PO TABS: 10.0000 mg | ORAL_TABLET | ORAL | 0 refills | Status: DC

## 2021-12-06 NOTE — Patient Instructions (Signed)
Preventive Care 7 Years and Older, Female ?Preventive care refers to lifestyle choices and visits with your health care provider that can promote health and wellness. Preventive care visits are also called wellness exams. ?What can I expect for my preventive care visit? ?Counseling ?Your health care provider may ask you questions about your: ?Medical history, including: ?Past medical problems. ?Family medical history. ?Pregnancy and menstrual history. ?History of falls. ?Current health, including: ?Memory and ability to understand (cognition). ?Emotional well-being. ?Home life and relationship well-being. ?Sexual activity and sexual health. ?Lifestyle, including: ?Alcohol, nicotine or tobacco, and drug use. ?Access to firearms. ?Diet, exercise, and sleep habits. ?Work and work Statistician. ?Sunscreen use. ?Safety issues such as seatbelt and bike helmet use. ?Physical exam ?Your health care provider will check your: ?Height and weight. These may be used to calculate your BMI (body mass index). BMI is a measurement that tells if you are at a healthy weight. ?Waist circumference. This measures the distance around your waistline. This measurement also tells if you are at a healthy weight and may help predict your risk of certain diseases, such as type 2 diabetes and high blood pressure. ?Heart rate and blood pressure. ?Body temperature. ?Skin for abnormal spots. ?What immunizations do I need? ? ?Vaccines are usually given at various ages, according to a schedule. Your health care provider will recommend vaccines for you based on your age, medical history, and lifestyle or other factors, such as travel or where you work. ?What tests do I need? ?Screening ?Your health care provider may recommend screening tests for certain conditions. This may include: ?Lipid and cholesterol levels. ?Hepatitis C test. ?Hepatitis B test. ?HIV (human immunodeficiency virus) test. ?STI (sexually transmitted infection) testing, if you are at  risk. ?Lung cancer screening. ?Colorectal cancer screening. ?Diabetes screening. This is done by checking your blood sugar (glucose) after you have not eaten for a while (fasting). ?Mammogram. Talk with your health care provider about how often you should have regular mammograms. ?BRCA-related cancer screening. This may be done if you have a family history of breast, ovarian, tubal, or peritoneal cancers. ?Bone density scan. This is done to screen for osteoporosis. ?Talk with your health care provider about your test results, treatment options, and if necessary, the need for more tests. ?Follow these instructions at home: ?Eating and drinking ? ?Eat a diet that includes fresh fruits and vegetables, whole grains, lean protein, and low-fat dairy products. Limit your intake of foods with high amounts of sugar, saturated fats, and salt. ?Take vitamin and mineral supplements as recommended by your health care provider. ?Do not drink alcohol if your health care provider tells you not to drink. ?If you drink alcohol: ?Limit how much you have to 0-1 drink a day. ?Know how much alcohol is in your drink. In the [place] one drink equals one 12 oz bottle of beer (355 mL), one 5 oz glass of wine (148 mL), or one 1? oz glass of hard liquor (44 mL). ?Lifestyle ?Brush your teeth every morning and night with fluoride toothpaste. Floss one time each day. ?Exercise for at least 30 minutes 5 or more days each week. ?Do not use any products that contain nicotine or tobacco. These products include cigarettes, chewing tobacco, and vaping devices, such as e-cigarettes. If you need help quitting, ask your health care provider. ?Do not use drugs. ?If you are sexually active, practice safe sex. Use a condom or other form of protection in order to prevent STIs. ?Take aspirin only as told by  your health care provider. Make sure that you understand how much to take and what form to take. Work with your health care provider to find out whether it  is safe and beneficial for you to take aspirin daily. ?Ask your health care provider if you need to take a cholesterol-lowering medicine (statin). ?Find healthy ways to manage stress, such as: ?Meditation, yoga, or listening to music. ?Journaling. ?Talking to a trusted person. ?Spending time with friends and family. ?Minimize exposure to UV radiation to reduce your risk of skin cancer. ?Safety ?Always wear your seat belt while driving or riding in a vehicle. ?Do not drive: ?If you have been drinking alcohol. Do not ride with someone who has been drinking. ?When you are tired or distracted. ?While texting. ?If you have been using any mind-altering substances or drugs. ?Wear a helmet and other protective equipment during sports activities. ?If you have firearms in your house, make sure you follow all gun safety procedures. ?What's next? ?Visit your health care provider once a year for an annual wellness visit. ?Ask your health care provider how often you should have your eyes and teeth checked. ?Stay up to date on all vaccines. ?This information is not intended to replace advice given to you by your health care provider. Make sure you discuss any questions you have with your health care provider. ?Document Revised: 01/25/2021 Document Reviewed: 01/25/2021 ?Elsevier Patient Education ? [place]. ? ?

## 2021-12-06 NOTE — Progress Notes (Signed)
? ?Subjective:  ? Denise Wolfe is a 81 y.o. female who presents for Medicare Annual (Subsequent) preventive examination. ? ?Review of Systems    ?General:   No F/C, wt loss ?Pulm:   No DIB, SOB, pleuritic chest pain ?Card:  No CP, palpitations ?Abd:  No n/v/d or pain ?Ext:  No edema or LE pain ?   ?Objective:  ?  ?Today's Vitals  ? 12/06/21 1504 12/06/21 1534  ?BP: (!) 168/84 128/64  ?Pulse: 68   ?Temp: 98.3 ?F (36.8 ?C)   ?SpO2: 98%   ?Weight: 131 lb (59.4 kg)   ?Height: '5\' 2"'$  (1.575 m)   ? ?Body mass index is 23.96 kg/m?. ? ? ?  10/09/2019  ?  9:37 AM 11/28/2017  ?  3:28 PM 07/23/2017  ? 10:31 AM 05/14/2017  ?  9:08 AM 04/27/2017  ? 11:21 AM 04/16/2017  ?  9:54 AM 03/25/2017  ?  2:23 PM  ?Advanced Directives  ?Does Patient Have a Medical Advance Directive? No No No No Yes No No  ?Would patient like information on creating a medical advance directive? No - Patient declined    No - Patient declined No - Patient declined   ? ? ?Current Medications (verified) ?Outpatient Encounter Medications as of 12/06/2021  ?Medication Sig  ? atorvastatin (LIPITOR) 80 MG tablet TAKE ONE-HALF TABLET BY  MOUTH DAILY (Patient taking differently: Take 40 mg by mouth at bedtime.)  ? cholecalciferol (VITAMIN D) 400 units TABS tablet Take 400 Units by mouth daily. Patient is not sure of the units.  ? fluticasone (FLONASE) 50 MCG/ACT nasal spray Place 1 spray into both nostrils daily as needed for allergies.  ? hydrochlorothiazide (MICROZIDE) 12.5 MG capsule TAKE 1 CAPSULE BY MOUTH  DAILY (Patient taking differently: Take 12.5 mg by mouth daily.)  ? losartan (COZAAR) 50 MG tablet TAKE 1 TABLET BY MOUTH  DAILY (Patient taking differently: Take 50 mg by mouth daily.)  ? sertraline (ZOLOFT) 50 MG tablet Take 1.5 tablets (75 mg total) by mouth daily.  ? vitamin B-12 (CYANOCOBALAMIN) 500 MCG tablet Take 1 tablet (500 mcg total) by mouth daily.  ? amphetamine-dextroamphetamine (ADDERALL) 10 MG tablet Take 1 tablet (10 mg total) by mouth every morning.   ? [DISCONTINUED] amphetamine-dextroamphetamine (ADDERALL) 10 MG tablet Take 1 tablet (10 mg total) by mouth every morning.  ? ?Facility-Administered Encounter Medications as of 12/06/2021  ?Medication  ? 0.9 %  sodium chloride infusion  ? ? ?Allergies (verified) ?Ibuprofen  ? ?History: ?Past Medical History:  ?Diagnosis Date  ? Anemia   ? during childhood  ? Arthritis   ? hands, shoulder  ? Breast cancer ([place]) 03/2000  ? left side modified mastecomy  ? Cancer [facility])   ? breast, age 48; left side  ? Colon cancer ([place]) 02/11/2017  ? rectum-squamous cell carcinoma  ? Depression   ? Hypertension   ? Personal history of chemotherapy   ? Personal history of radiation therapy   ? Squamous cell carcinoma of rectum ([place]) 02/14/2017  ? ?Past Surgical History:  ?Procedure Laterality Date  ? ABDOMINAL HYSTERECTOMY    ? With bilateral oophorectomy. 2002.  ? BREAST BIOPSY Right 2004  ? benign  ? BREAST SURGERY Left 2001  ? biopsy  ? CATARACT EXTRACTION Bilateral 2017  ? IR FLUORO GUIDE CV LINE RIGHT  03/11/2017  ? IR FLUORO GUIDE CV LINE RIGHT  04/16/2017  ? IR US GUIDE VASC ACCESS RIGHT  03/11/2017  ? IR US GUIDE VASC ACCESS RIGHT  04/16/2017  ? MASTECTOMY MODIFIED RADICAL Left 2001  ? MENISCUS DEBRIDEMENT Right 2003  ? TUMOR EXCISION Right   ? thigh area  ? ?Family History  ?Problem Relation Age of Onset  ? Cancer Mother 58  ?     breast  ? Breast cancer Mother   ? Heart disease Father   ? Colon cancer Neg Hx   ? Esophageal cancer Neg Hx   ? Rectal cancer Neg Hx   ? Stomach cancer Neg Hx   ? ?Social History  ? ?Socioeconomic History  ? Marital status: Widowed  ?  Spouse name: Not on file  ? Number of children: Not on file  ? Years of education: Not on file  ? Highest education level: Not on file  ?Occupational History  ? Not on file  ?Tobacco Use  ? Smoking status: Never  ? Smokeless tobacco: Never  ?Substance and Sexual Activity  ? Alcohol use: Yes  ?  Alcohol/week: 14.0 standard drinks  ?  Types: 14 Glasses of wine per week  ? Drug  use: No  ? Sexual activity: Not Currently  ?Other Topics Concern  ? Not on file  ?Social History Narrative  ? Not on file  ? ?Social Determinants of Health  ? ?Financial Resource Strain: Not on file  ?Food Insecurity: Not on file  ?Transportation Needs: Not on file  ?Physical Activity: Not on file  ?Stress: Not on file  ?Social Connections: Not on file  ? ? ?Tobacco Counseling ?Counseling given: Not Answered ? ? ? ?  ? ?How often do you need to have someone help you when you read instructions, pamphlets, or other written materials from your doctor or pharmacy?: (P) 1 - Never ? ?Diabetic?no ? ? ?Activities of Daily Living ? ?  12/06/2021  ?  9:24 AM 11/23/2021  ?  3:46 PM  ?In your present state of health, do you have any difficulty performing the following activities:  ?Hearing? 0 0  ?Vision? 0 0  ?Difficulty concentrating or making decisions? 1 1  ?Walking or climbing stairs? 0 0  ?Dressing or bathing? 0 0  ?Doing errands, shopping? 0 0  ?Preparing Food and eating ? N   ?Using the Toilet? N   ?In the past six months, have you accidently leaked urine? Y   ?Do you have problems with loss of bowel control? N   ?Managing your Medications? N   ?Managing your Finances? N   ?Housekeeping or managing your Housekeeping? N   ? ? ?Patient Care Team: ?Lorrene Reid, PA-C as PCP - General ?Silverio Decamp, MD as Consulting Physician (Family Medicine) ?Truitt Merle, MD as Consulting Physician (Hematology) ?Leighton Ruff, MD as Consulting Physician (General Surgery) ? ?Indicate any recent Medical Services you may have received from other than [facility] providers in the past year (date may be approximate). ? ?   ?Assessment:  ? This is a routine wellness examination for Denise Wolfe. ? ?Hearing/Vision screen ?No results found. ? ?Dietary issues and exercise activities discussed: ?-Reports feeling better, dizziness has improved. Able to do daily activities at home and with yard-work. Reports no change with appetite. Usually has breakfast  and dinner with intermittent snacks. ? ? Goals Addressed   ?None ?  ?Depression Screen ? ?  12/06/2021  ?  3:04 PM 11/23/2021  ?  3:46 PM 08/29/2021  ?  1:04 PM 06/02/2021  ?  8:57 AM 03/01/2021  ?  2:27 PM 11/21/2020  ? 10:00 AM 08/22/2020  ? 10:49 AM  ?  PHQ 2/9 Scores  ?PHQ - 2 Score 0 0 0 0 2 0 0  ?PHQ- 9 Score '2 3 2 '$ 0 '7 5 3  '$ ?  ?Fall Risk ? ?  12/06/2021  ?  9:24 AM 08/29/2021  ?  1:04 PM 06/02/2021  ?  8:57 AM 03/01/2021  ?  2:27 PM 11/21/2020  ?  9:59 AM  ?Fall Risk   ?Falls in the past year? 0 0 0 0 0  ?Number falls in past yr:  0 0 0   ?Injury with Fall?  0 0 0   ?Risk for fall due to :  No Fall Risks No Fall Risks No Fall Risks   ?Follow up  Falls evaluation completed Falls evaluation completed Falls evaluation completed Falls evaluation completed  ? ? ?FALL RISK PREVENTION PERTAINING TO THE HOME: ? ?Any stairs in or around the home? Yes  ?If so, are there any without handrails? No  ?Home free of loose throw rugs in walkways, pet beds, electrical cords, etc? Yes  ?Adequate lighting in your home to reduce risk of falls? Yes  ? ?ASSISTIVE DEVICES UTILIZED TO PREVENT FALLS: ? ?Life alert? No  ?Use of a cane, walker or w/c? No  ?Grab bars in the bathroom? No  ?Shower chair or bench in shower? No  ?Elevated toilet seat or a handicapped toilet? No  ? ?TIMED UP AND GO: ? ?Was the test performed? Yes .  ?Length of time to ambulate 10 feet: 12 sec.  ? ?Gait steady and fast without use of assistive device ? ?Cognitive Function: wnl's ?  ? ?  12/06/2021  ?  2:56 PM 11/21/2020  ? 10:01 AM 12/15/2018  ?  8:17 AM  ?6CIT Screen  ?What Year? 0 points 0 points 0 points  ?What month? 0 points 0 points 0 points  ?What time? 0 points 0 points 0 points  ?Count back from 20 0 points 0 points 0 points  ?Months in reverse 0 points 0 points 0 points  ?Repeat phrase 2 points 0 points 0 points  ?Total Score 2 points 0 points 0 points  ? ? ?Immunizations ?Immunization History  ?Administered Date(s) Administered  ? Influenza, High Dose Seasonal PF  07/23/2017, 06/26/2018  ? PFIZER(Purple Top)SARS-COV-2 Vaccination 09/24/2019, 10/19/2019, 06/26/2020, 03/06/2021  ? Pneumococcal Conjugate-13 12/27/2016  ? Pneumococcal Polysaccharide-23 01/19/2009  ? T

## 2021-12-19 ENCOUNTER — Other Ambulatory Visit: Payer: Self-pay | Admitting: Physician Assistant

## 2021-12-19 DIAGNOSIS — E7889 Other lipoprotein metabolism disorders: Secondary | ICD-10-CM

## 2021-12-19 DIAGNOSIS — Z0000 Encounter for general adult medical examination without abnormal findings: Secondary | ICD-10-CM

## 2021-12-19 DIAGNOSIS — E559 Vitamin D deficiency, unspecified: Secondary | ICD-10-CM

## 2021-12-19 DIAGNOSIS — I10 Essential (primary) hypertension: Secondary | ICD-10-CM

## 2021-12-21 ENCOUNTER — Other Ambulatory Visit: Payer: Medicare Other

## 2021-12-21 DIAGNOSIS — E559 Vitamin D deficiency, unspecified: Secondary | ICD-10-CM

## 2021-12-21 DIAGNOSIS — Z0000 Encounter for general adult medical examination without abnormal findings: Secondary | ICD-10-CM

## 2021-12-21 DIAGNOSIS — E7889 Other lipoprotein metabolism disorders: Secondary | ICD-10-CM

## 2021-12-21 DIAGNOSIS — I10 Essential (primary) hypertension: Secondary | ICD-10-CM | POA: Diagnosis not present

## 2021-12-22 LAB — COMPREHENSIVE METABOLIC PANEL
ALT: 12 IU/L (ref 0–32)
AST: 19 IU/L (ref 0–40)
Albumin/Globulin Ratio: 2.3 — ABNORMAL HIGH (ref 1.2–2.2)
Albumin: 4.4 g/dL (ref 3.7–4.7)
Alkaline Phosphatase: 43 IU/L — ABNORMAL LOW (ref 44–121)
BUN/Creatinine Ratio: 21 (ref 12–28)
BUN: 19 mg/dL (ref 8–27)
Bilirubin Total: 0.2 mg/dL (ref 0.0–1.2)
CO2: 26 mmol/L (ref 20–29)
Calcium: 10 mg/dL (ref 8.7–10.3)
Chloride: 100 mmol/L (ref 96–106)
Creatinine, Ser: 0.89 mg/dL (ref 0.57–1.00)
Globulin, Total: 1.9 g/dL (ref 1.5–4.5)
Glucose: 100 mg/dL — ABNORMAL HIGH (ref 70–99)
Potassium: 4.2 mmol/L (ref 3.5–5.2)
Sodium: 141 mmol/L (ref 134–144)
Total Protein: 6.3 g/dL (ref 6.0–8.5)
eGFR: 66 mL/min/{1.73_m2} (ref >59)

## 2021-12-22 LAB — LIPID PANEL
Chol/HDL Ratio: 2.4 ratio (ref 0.0–4.4)
Cholesterol, Total: 181 mg/dL (ref 100–199)
HDL: 74 mg/dL (ref >39)
LDL Chol Calc (NIH): 97 mg/dL (ref 0–99)
Triglycerides: 51 mg/dL (ref 0–149)
VLDL Cholesterol Cal: 10 mg/dL (ref 5–40)

## 2021-12-22 LAB — CBC
Hematocrit: 39.6 % (ref 34.0–46.6)
Hemoglobin: 13.5 g/dL (ref 11.1–15.9)
MCH: 30.8 pg (ref 26.6–33.0)
MCHC: 34.1 g/dL (ref 31.5–35.7)
MCV: 90 fL (ref 79–97)
Platelets: 310 10*3/uL (ref 150–450)
RBC: 4.38 x10E6/uL (ref 3.77–5.28)
RDW: 12.9 % (ref 11.7–15.4)
WBC: 3.1 10*3/uL — ABNORMAL LOW (ref 3.4–10.8)

## 2021-12-22 LAB — TSH: TSH: 1.8 u[IU]/mL (ref 0.450–4.500)

## 2021-12-22 LAB — VITAMIN D 25 HYDROXY (VIT D DEFICIENCY, FRACTURES): Vit D, 25-Hydroxy: 144 ng/mL — ABNORMAL HIGH (ref 30.0–100.0)

## 2021-12-31 ENCOUNTER — Other Ambulatory Visit: Payer: Self-pay | Admitting: Physician Assistant

## 2022-01-26 ENCOUNTER — Other Ambulatory Visit: Payer: Self-pay | Admitting: Physician Assistant

## 2022-01-26 DIAGNOSIS — E782 Mixed hyperlipidemia: Secondary | ICD-10-CM

## 2022-02-05 DIAGNOSIS — Z9841 Cataract extraction status, right eye: Secondary | ICD-10-CM | POA: Diagnosis not present

## 2022-02-05 DIAGNOSIS — H52223 Regular astigmatism, bilateral: Secondary | ICD-10-CM | POA: Diagnosis not present

## 2022-02-05 DIAGNOSIS — Z9842 Cataract extraction status, left eye: Secondary | ICD-10-CM | POA: Diagnosis not present

## 2022-03-07 ENCOUNTER — Ambulatory Visit (INDEPENDENT_AMBULATORY_CARE_PROVIDER_SITE_OTHER): Payer: Medicare Other | Admitting: Physician Assistant

## 2022-03-07 ENCOUNTER — Encounter: Payer: Self-pay | Admitting: Physician Assistant

## 2022-03-07 VITALS — BP 132/76 | HR 85 | Ht 61.81 in | Wt 127.2 lb

## 2022-03-07 DIAGNOSIS — F430 Acute stress reaction: Secondary | ICD-10-CM | POA: Diagnosis not present

## 2022-03-07 DIAGNOSIS — I10 Essential (primary) hypertension: Secondary | ICD-10-CM

## 2022-03-07 DIAGNOSIS — F988 Other specified behavioral and emotional disorders with onset usually occurring in childhood and adolescence: Secondary | ICD-10-CM

## 2022-03-07 DIAGNOSIS — F339 Major depressive disorder, recurrent, unspecified: Secondary | ICD-10-CM

## 2022-03-07 DIAGNOSIS — E782 Mixed hyperlipidemia: Secondary | ICD-10-CM | POA: Diagnosis not present

## 2022-03-07 MED ORDER — SERTRALINE HCL 50 MG PO TABS: 75.0000 mg | ORAL_TABLET | Freq: Every day | ORAL | 1 refills | Status: DC

## 2022-03-07 MED ORDER — AMPHETAMINE-DEXTROAMPHETAMINE 10 MG PO TABS
10.0000 mg | ORAL_TABLET | ORAL | 0 refills | Status: DC
Start: 2022-03-07 — End: 2022-06-07

## 2022-03-07 NOTE — Assessment & Plan Note (Signed)
-  Stable. -Continue current medication regimen.  -Will continue to monitor. 

## 2022-03-07 NOTE — Progress Notes (Signed)
Established patient visit   Patient: Denise Wolfe   DOB: 07/12/1941   81 y.o. Female  MRN: [id number] Visit Date: 03/07/2022  Chief Complaint  Patient presents with   Follow-up   Subjective    HPI  Patient presents for chronic follow-up visit. Patient has no acute concerns. Patient reports taking sertraline 75 mg without issues. Mood has been stable.  ADD: Patient reports no issues with medication. Does report sometimes forgets to take a dose of Adderall and notices trouble with  HTN: Pt denies chest pain, palpitations, dizziness or lower extremity swelling. Taking medication as directed without side effects. Pt follows a low salt diet.  HLD: Pt taking medication as directed without issues. No myalgias or muscle weakness. Patient reports eats a balanced diet.     03/07/2022    3:33 PM 12/06/2021    3:04 PM 11/23/2021    3:46 PM 08/29/2021    1:04 PM 06/02/2021    8:57 AM  Depression screen PHQ 2/9  Decreased Interest 0 0 0 0 0  Down, Depressed, Hopeless 0 0 0 0 0  PHQ - 2 Score 0 0 0 0 0  Altered sleeping _0 0 0  Tired, decreased energy 0 0 1 0 0  Change in appetite 0 0 0 0 0  Feeling bad or failure about yourself  0 0 0 0 0  Trouble concentrating _1 0  Moving slowly or fidgety/restless 0 0 0 0 0  Suicidal thoughts 0 0 0 0 0  PHQ-9 Score _2 0  Difficult doing work/chores  Somewhat difficult  Not difficult at all Not difficult at all      03/07/2022    3:33 PM 12/06/2021    3:05 PM 11/23/2021    3:46 PM 08/29/2021    1:05 PM  GAD 7 : Generalized Anxiety Score  Nervous, Anxious, on Edge 0 0 0 0  Control/stop worrying 0 0 0 0  Worry too much - different things 0 0 0 0  Trouble relaxing 0 0 1 3  Restless 1 0 0 0  Easily annoyed or irritable 0 0 0 0  Afraid - awful might happen 0 0 0 0  Total GAD 7 Score 1 0 1 3  Anxiety Difficulty  Not difficult at all  Not difficult at all      Medications: Outpatient Medications Prior to Visit  Medication Sig    atorvastatin (LIPITOR) 80 MG tablet TAKE ONE-HALF TABLET BY  MOUTH DAILY   fluticasone (FLONASE) 50 MCG/ACT nasal spray Place 1 spray into both nostrils daily as needed for allergies.   hydrochlorothiazide (MICROZIDE) 12.5 MG capsule TAKE 1 CAPSULE BY MOUTH DAILY   losartan (COZAAR) 50 MG tablet Take 1 tablet (50 mg total) by mouth daily.   vitamin B-12 (CYANOCOBALAMIN) 500 MCG tablet Take 1 tablet (500 mcg total) by mouth daily.   [DISCONTINUED] sertraline (ZOLOFT) 50 MG tablet Take 1.5 tablets (75 mg total) by mouth daily.   cholecalciferol (VITAMIN D) 400 units TABS tablet Take 400 Units by mouth daily. Patient is not sure of the units. (Patient not taking: Reported on 03/07/2022)   [DISCONTINUED] amphetamine-dextroamphetamine (ADDERALL) 10 MG tablet Take 1 tablet (10 mg total) by mouth every morning.   Facility-Administered Medications Prior to Visit  Medication Dose Route Frequency Provider   0.9 %  sodium chloride infusion  500 mL Intravenous Continuous [name], MD    Review of Systems Review of Systems:  A fourteen system review of systems was performed and found to be positive as per HPI.   Last CBC Lab Results  Component Value Date   WBC 3.1 (L) 12/21/2021   HGB 13.5 12/21/2021   HCT 39.6 12/21/2021   MCV 90 12/21/2021   MCH 30.8 12/21/2021   RDW 12.9 12/21/2021   PLT 310 54/65/0354   Last metabolic panel Lab Results  Component Value Date   GLUCOSE 100 (H) 12/21/2021   NA 141 12/21/2021   K 4.2 12/21/2021   CL 100 12/21/2021   CO2 26 12/21/2021   BUN 19 12/21/2021   CREATININE 0.89 12/21/2021   EGFR 66 12/21/2021   CALCIUM 10.0 12/21/2021   PHOS 3.9 08/11/2019   PROT 6.3 12/21/2021   ALBUMIN 4.4 12/21/2021   LABGLOB 1.9 12/21/2021   AGRATIO 2.3 (H) 12/21/2021   BILITOT 0.2 12/21/2021   ALKPHOS 43 (L) 12/21/2021   AST 19 12/21/2021   ALT 12 12/21/2021   ANIONGAP 9 11/13/2021   Last lipids Lab Results  Component Value Date   CHOL 181 12/21/2021    HDL 74 12/21/2021   LDLCALC 97 12/21/2021   TRIG 51 12/21/2021   CHOLHDL 2.4 12/21/2021   Last hemoglobin A1c Lab Results  Component Value Date   HGBA1C 5.1 07/11/2016   Last thyroid functions Lab Results  Component Value Date   TSH 1.800 12/21/2021   Last vitamin D Lab Results  Component Value Date   VD25OH 144.0 (H) 12/21/2021       Objective    BP 132/76   Pulse 85   Ht 5' 1.81" (1.57 m)   Wt 127 lb 3.2 oz (57.7 kg)   SpO2 97%   BMI 23.41 kg/m  BP Readings from Last 3 Encounters:  03/07/22 132/76  12/06/21 128/64  11/23/21 118/70   Wt Readings from Last 3 Encounters:  03/07/22 127 lb 3.2 oz (57.7 kg)  12/06/21 131 lb (59.4 kg)  11/23/21 124 lb 6.4 oz (56.4 kg)    Physical Exam  General:  Well Developed, well nourished, appropriate for stated age.  Neuro:  Alert and oriented,  extra-ocular muscles intact  HEENT:  Normocephalic, atraumatic, neck supple  Skin:  no gross rash, warm, pink. Cardiac:  RRR, S1 S2 Respiratory: CTA B/L w/o wheezing, crackles or rales.  Vascular:  Ext warm, no cyanosis apprec.; cap RF less 2 sec. No edema. Psych:  No HI/SI, judgement and insight good, Euthymic mood. Full Affect.   No results found for any visits on 03/07/22.  Assessment & Plan      Problem List Items Addressed This Visit       Cardiovascular and Mediastinum   HTN (hypertension), benign - Primary (Chronic)    -Stable. Continue current medication regimen. Will continue to monitor.        Other   Depression, recurrent ([place]) (Chronic)    -Stable. Continue current medication regimen. Provided medication refill. Will continue to monitor.      Relevant Medications   sertraline (ZOLOFT) 50 MG tablet   HLD (hyperlipidemia) (Chronic)    -Last lipid panel wnl's. LDL 97. Continue current medication regimen. Will continue to monitor.      Mixed disorder as reaction to stress (Chronic)    -Stable. Continue current medication regimen. Will continue to monitor.       Relevant Medications   sertraline (ZOLOFT) 50 MG tablet   Attention deficit disorder (Chronic)    -Stable. Continue current medication regimen. PDMP reviewed, no aberrancies  noted. Will continue to monitor.      Relevant Medications   amphetamine-dextroamphetamine (ADDERALL) 10 MG tablet    Return in about 3 months (around 06/07/2022) for ADD, HTN, vitamin D.        Lorrene Reid, PA-C  [facility] at [facility] [phone] (phone) [phone] (fax)  [place]

## 2022-03-07 NOTE — Assessment & Plan Note (Signed)
-  Stable. Continue current medication regimen. Provided medication refill. Will continue to monitor.

## 2022-03-07 NOTE — Assessment & Plan Note (Signed)
-  Last lipid panel wnl's. LDL 97. Continue current medication regimen. Will continue to monitor.

## 2022-03-07 NOTE — Assessment & Plan Note (Signed)
-  Stable. Continue current medication regimen. PDMP reviewed, no aberrancies noted. Will continue to monitor.

## 2022-03-29 ENCOUNTER — Other Ambulatory Visit: Payer: Self-pay | Admitting: Physician Assistant

## 2022-04-03 ENCOUNTER — Other Ambulatory Visit: Payer: Self-pay | Admitting: Physician Assistant

## 2022-04-03 DIAGNOSIS — J3089 Other allergic rhinitis: Secondary | ICD-10-CM

## 2022-06-07 ENCOUNTER — Ambulatory Visit (INDEPENDENT_AMBULATORY_CARE_PROVIDER_SITE_OTHER): Payer: Medicare Other | Admitting: Physician Assistant

## 2022-06-07 ENCOUNTER — Encounter: Payer: Self-pay | Admitting: Physician Assistant

## 2022-06-07 VITALS — BP 125/69 | HR 76 | Ht 61.81 in | Wt 129.8 lb

## 2022-06-07 DIAGNOSIS — F988 Other specified behavioral and emotional disorders with onset usually occurring in childhood and adolescence: Secondary | ICD-10-CM

## 2022-06-07 DIAGNOSIS — I10 Essential (primary) hypertension: Secondary | ICD-10-CM | POA: Diagnosis not present

## 2022-06-07 DIAGNOSIS — F339 Major depressive disorder, recurrent, unspecified: Secondary | ICD-10-CM | POA: Diagnosis not present

## 2022-06-07 MED ORDER — AMPHETAMINE-DEXTROAMPHETAMINE 10 MG PO TABS: 10.0000 mg | ORAL_TABLET | ORAL | 0 refills | Status: DC

## 2022-06-07 NOTE — Assessment & Plan Note (Signed)
-  Stable. PHQ-9 score at baseline. Continue sertraline 75 mg daily.

## 2022-06-07 NOTE — Progress Notes (Signed)
Established patient visit   Patient: Denise Wolfe   DOB: 1941/06/24   81 y.o. Female  MRN: [id number] Visit Date: 06/07/2022  Chief Complaint  Patient presents with   Follow-up   Subjective    HPI  Patient presents for chronic follow-up and medication management. Patient has no acute concerns today. Patient has been on Adderall for many years. Reports medication helps her be able to do her house chores and stay on task. Has not had issues with the medication. No sleep disturbance or mood changes.  HTN: No chest pain, palpitations, syncope or lower extremity swelling. Patient reports once a while will get dizzy which resolves after resting for a few minutes. Taking medication as directed without side effects.   Mood: Patient reports mood has been stable. No labile mood, or SI/HI. Taking sertraline 75 mg without problems.     06/07/2022    3:12 PM 03/07/2022    3:33 PM 12/06/2021    3:04 PM 11/23/2021    3:46 PM 08/29/2021    1:04 PM  Depression screen PHQ 2/9  Decreased Interest 0 0 0 0 0  Down, Depressed, Hopeless 0 0 0 0 0  PHQ - 2 Score 0 0 0 0 0  Altered sleeping _0 0  Tired, decreased energy 0 0 0 1 0  Change in appetite 0 0 0 0 0  Feeling bad or failure about yourself  0 0 0 0 0  Trouble concentrating _1 Moving slowly or fidgety/restless 0 0 0 0 0  Suicidal thoughts 0 0 0 0 0  PHQ-9 Score _2 Difficult doing work/chores   Somewhat difficult  Not difficult at all      06/07/2022    3:13 PM 03/07/2022    3:33 PM 12/06/2021    3:05 PM 11/23/2021    3:46 PM  GAD 7 : Generalized Anxiety Score  Nervous, Anxious, on Edge 0 0 0 0  Control/stop worrying 0 0 0 0  Worry too much - different things 0 0 0 0  Trouble relaxing 1 0 0 1  Restless 0 1 0 0  Easily annoyed or irritable 0 0 0 0  Afraid - awful might happen 0 0 0 0  Total GAD 7 Score 1 1 0 1  Anxiety Difficulty   Not difficult at all       Medications: Outpatient Medications Prior to Visit   Medication Sig   atorvastatin (LIPITOR) 80 MG tablet TAKE ONE-HALF TABLET BY  MOUTH DAILY   cholecalciferol (VITAMIN D) 400 units TABS tablet Take 400 Units by mouth daily. Patient is not sure of the units. (Patient not taking: Reported on 03/07/2022)   fluticasone (FLONASE) 50 MCG/ACT nasal spray USE 1 SPRAY IN BOTH NOSTRILS  DAILY AS NEEDED FOR ALLERGIES   hydrochlorothiazide (MICROZIDE) 12.5 MG capsule TAKE 1 CAPSULE BY MOUTH DAILY   losartan (COZAAR) 50 MG tablet TAKE 1 TABLET BY MOUTH DAILY   sertraline (ZOLOFT) 50 MG tablet Take 1.5 tablets (75 mg total) by mouth daily.   vitamin B-12 (CYANOCOBALAMIN) 500 MCG tablet Take 1 tablet (500 mcg total) by mouth daily.   [DISCONTINUED] amphetamine-dextroamphetamine (ADDERALL) 10 MG tablet Take 1 tablet (10 mg total) by mouth every morning.   Facility-Administered Medications Prior to Visit  Medication Dose Route Frequency Provider   0.9 %  sodium chloride infusion  500 mL Intravenous Continuous [name], MD    Review of  Systems Review of Systems:  A fourteen system review of systems was performed and found to be positive as per HPI.  Last CBC Lab Results  Component Value Date   WBC 3.1 (L) 12/21/2021   HGB 13.5 12/21/2021   HCT 39.6 12/21/2021   MCV 90 12/21/2021   MCH 30.8 12/21/2021   RDW 12.9 12/21/2021   PLT 310 36/14/4315   Last metabolic panel Lab Results  Component Value Date   GLUCOSE 100 (H) 12/21/2021   NA 141 12/21/2021   K 4.2 12/21/2021   CL 100 12/21/2021   CO2 26 12/21/2021   BUN 19 12/21/2021   CREATININE 0.89 12/21/2021   EGFR 66 12/21/2021   CALCIUM 10.0 12/21/2021   PHOS 3.9 08/11/2019   PROT 6.3 12/21/2021   ALBUMIN 4.4 12/21/2021   LABGLOB 1.9 12/21/2021   AGRATIO 2.3 (H) 12/21/2021   BILITOT 0.2 12/21/2021   ALKPHOS 43 (L) 12/21/2021   AST 19 12/21/2021   ALT 12 12/21/2021   ANIONGAP 9 11/13/2021   Last lipids Lab Results  Component Value Date   CHOL 181 12/21/2021   HDL 74  12/21/2021   LDLCALC 97 12/21/2021   TRIG 51 12/21/2021   CHOLHDL 2.4 12/21/2021   Last hemoglobin A1c Lab Results  Component Value Date   HGBA1C 5.1 07/11/2016   Last thyroid functions Lab Results  Component Value Date   TSH 1.800 12/21/2021   Last vitamin D Lab Results  Component Value Date   VD25OH 144.0 (H) 12/21/2021     Objective    BP 125/69   Pulse 76   Ht 5' 1.81" (1.57 m)   Wt 129 lb 12.8 oz (58.9 kg)   SpO2 97%   BMI 23.89 kg/m  BP Readings from Last 3 Encounters:  06/07/22 125/69  03/07/22 132/76  12/06/21 128/64   Wt Readings from Last 3 Encounters:  06/07/22 129 lb 12.8 oz (58.9 kg)  03/07/22 127 lb 3.2 oz (57.7 kg)  12/06/21 131 lb (59.4 kg)    Physical Exam  General:  Well Developed, well nourished, appropriate for stated age.  Neuro:  Alert and oriented,  extra-ocular muscles intact  HEENT:  Normocephalic, atraumatic, neck supple  Skin:  no gross rash, warm, pink. Cardiac:  RRR, S1 S2 Respiratory: CTA B/L  Vascular:  Ext warm, no cyanosis apprec.; no edema Psych:  No HI/SI, judgement and insight good, Euthymic mood. Full Affect.   No results found for any visits on 06/07/22.  Assessment & Plan      Problem List Items Addressed This Visit       Cardiovascular and Mediastinum   HTN (hypertension), benign - Primary (Chronic)    -BP today at goal. Continue Losartan 50 mg daily. Last CMP, renal function and electrolytes normal. Recommend repeating CMP at follow-up visit.        Other   Depression, recurrent ([place]) (Chronic)    -Stable. PHQ-9 score at baseline. Continue sertraline 75 mg daily.      Attention deficit disorder (Chronic)    -Stable. Continue Adderall 10 mg daily. PDMP reviewed, no aberrancies. Provided refill. Follow-up in 3 months for medication management.      Relevant Medications   amphetamine-dextroamphetamine (ADDERALL) 10 MG tablet     Return in about 3 months (around 09/07/2022) for HTN, HLD, ADD.         Lorrene Reid, PA-C  [facility] at [facility] [phone] (phone) [phone] (fax)  [place]

## 2022-06-07 NOTE — Assessment & Plan Note (Addendum)
-  BP today at goal. Continue Losartan 50 mg daily. Last CMP, renal function and electrolytes normal. Recommend repeating CMP at follow-up visit.

## 2022-06-07 NOTE — Assessment & Plan Note (Signed)
-  Stable. Continue Adderall 10 mg daily. PDMP reviewed, no aberrancies. Provided refill. Follow-up in 3 months for medication management.

## 2022-07-03 ENCOUNTER — Other Ambulatory Visit: Payer: Self-pay | Admitting: Physician Assistant

## 2022-08-21 ENCOUNTER — Telehealth: Payer: Self-pay

## 2022-08-21 NOTE — Telephone Encounter (Unsigned)
Pt had VCE done. Per Dr. Hilarie Fredrickson capsule stayed in the stomach for over 3 hours. The stomach was full of food. He would like to repeat VCE having the pt do a full prep and with reglan. Left message for pt to call back.  Incorrect patient.

## 2022-08-21 NOTE — Telephone Encounter (Signed)
Patient is returning your call.  

## 2022-09-07 ENCOUNTER — Ambulatory Visit (INDEPENDENT_AMBULATORY_CARE_PROVIDER_SITE_OTHER): Payer: Medicare Other | Admitting: Nurse Practitioner

## 2022-09-07 ENCOUNTER — Encounter: Payer: Self-pay | Admitting: Nurse Practitioner

## 2022-09-07 VITALS — BP 123/82 | HR 82 | Resp 18 | Ht 61.81 in | Wt 128.0 lb

## 2022-09-07 DIAGNOSIS — E782 Mixed hyperlipidemia: Secondary | ICD-10-CM | POA: Diagnosis not present

## 2022-09-07 DIAGNOSIS — F430 Acute stress reaction: Secondary | ICD-10-CM

## 2022-09-07 DIAGNOSIS — F988 Other specified behavioral and emotional disorders with onset usually occurring in childhood and adolescence: Secondary | ICD-10-CM | POA: Diagnosis not present

## 2022-09-07 DIAGNOSIS — I10 Essential (primary) hypertension: Secondary | ICD-10-CM

## 2022-09-07 MED ORDER — AMPHETAMINE-DEXTROAMPHETAMINE 10 MG PO TABS: 10.0000 mg | ORAL_TABLET | ORAL | 0 refills | Status: DC

## 2022-09-07 NOTE — Progress Notes (Signed)
Established patient visit   Patient: Denise Wolfe   DOB: May 10, 1941   82 y.o. Female  MRN: [id number] Visit Date: 09/07/2022   Chief Complaint  Patient presents with   Follow-up   Hypertension   Hyperlipidemia   ADD   Subjective    HPI  Follow up  -hypertension  --well managed.  ADD -currently takes adderall 10 mg daily. Doing well with this dosing. Does need to have refills today  -She denies chest pain, chest pressure, or shortness of breath. She denies headaches or visual disturbances. She denies abdominal pain, nausea, vomiting, or changes in bowel or bladder habits.    Medications: Outpatient Medications Prior to Visit  Medication Sig   atorvastatin (LIPITOR) 80 MG tablet TAKE ONE-HALF TABLET BY  MOUTH DAILY   cholecalciferol (VITAMIN D) 400 units TABS tablet Take 400 Units by mouth daily. Patient is not sure of the units.   fluticasone (FLONASE) 50 MCG/ACT nasal spray USE 1 SPRAY IN BOTH NOSTRILS  DAILY AS NEEDED FOR ALLERGIES   hydrochlorothiazide (MICROZIDE) 12.5 MG capsule TAKE 1 CAPSULE BY MOUTH DAILY   losartan (COZAAR) 50 MG tablet TAKE 1 TABLET BY MOUTH DAILY   sertraline (ZOLOFT) 50 MG tablet Take 1.5 tablets (75 mg total) by mouth daily.   vitamin B-12 (CYANOCOBALAMIN) 500 MCG tablet Take 1 tablet (500 mcg total) by mouth daily.   [DISCONTINUED] amphetamine-dextroamphetamine (ADDERALL) 10 MG tablet Take 1 tablet (10 mg total) by mouth every morning.   Facility-Administered Medications Prior to Visit  Medication Dose Route Frequency Provider   0.9 %  sodium chloride infusion  500 mL Intravenous Continuous [name], MD    Review of Systems See HPI    Last CBC Lab Results  Component Value Date   WBC 3.1 (L) 12/21/2021   HGB 13.5 12/21/2021   HCT 39.6 12/21/2021   MCV 90 12/21/2021   MCH 30.8 12/21/2021   RDW 12.9 12/21/2021   PLT 310 [id number]   Last metabolic panel Lab Results  Component Value Date   GLUCOSE 100 (H) 12/21/2021   NA 141  12/21/2021   K 4.2 12/21/2021   CL 100 12/21/2021   CO2 26 12/21/2021   BUN 19 12/21/2021   CREATININE 0.89 12/21/2021   EGFR 66 12/21/2021   CALCIUM 10.0 12/21/2021   PHOS 3.9 08/11/2019   PROT 6.3 12/21/2021   ALBUMIN 4.4 12/21/2021   LABGLOB 1.9 12/21/2021   AGRATIO 2.3 (H) 12/21/2021   BILITOT 0.2 12/21/2021   ALKPHOS 43 (L) 12/21/2021   AST 19 12/21/2021   ALT 12 12/21/2021   ANIONGAP 9 11/13/2021   Last lipids Lab Results  Component Value Date   CHOL 181 12/21/2021   HDL 74 12/21/2021   LDLCALC 97 12/21/2021   TRIG 51 12/21/2021   CHOLHDL 2.4 12/21/2021   Last hemoglobin A1c Lab Results  Component Value Date   HGBA1C 5.1 07/11/2016   Last thyroid functions Lab Results  Component Value Date   TSH 1.800 12/21/2021   Last vitamin D Lab Results  Component Value Date   VD25OH 144.0 (H) 12/21/2021       Objective     Today's Vitals   09/07/22 1039  BP: 123/82  Pulse: 82  Resp: 18  SpO2: 99%  Weight: 128 lb (58.1 kg)  Height: 5' 1.81" (1.57 m)   Body mass index is 23.56 kg/m.  BP Readings from Last 3 Encounters:  09/07/22 123/82  06/07/22 125/69  03/07/22 132/76    Wt  Readings from Last 3 Encounters:  09/07/22 128 lb (58.1 kg)  06/07/22 129 lb 12.8 oz (58.9 kg)  03/07/22 127 lb 3.2 oz (57.7 kg)    Physical Exam Vitals and nursing note reviewed.  Constitutional:      Appearance: Normal appearance. She is well-developed.  HENT:     Head: Normocephalic and atraumatic.     Nose: Nose normal.     Mouth/Throat:     Mouth: Mucous membranes are moist.     Pharynx: Oropharynx is clear.  Eyes:     Extraocular Movements: Extraocular movements intact.     Conjunctiva/sclera: Conjunctivae normal.     Pupils: Pupils are equal, round, and reactive to light.  Cardiovascular:     Rate and Rhythm: Normal rate and regular rhythm.     Pulses: Normal pulses.     Heart sounds: Normal heart sounds.  Pulmonary:     Effort: Pulmonary effort is normal.      Breath sounds: Normal breath sounds.  Abdominal:     Palpations: Abdomen is soft.  Musculoskeletal:        General: Normal range of motion.     Cervical back: Normal range of motion and neck supple.  Lymphadenopathy:     Cervical: No cervical adenopathy.  Skin:    General: Skin is warm and dry.     Capillary Refill: Capillary refill takes less than 2 seconds.  Neurological:     General: No focal deficit present.     Mental Status: She is alert and oriented to person, place, and time.  Psychiatric:        Mood and Affect: Mood normal.        Behavior: Behavior normal.        Thought Content: Thought content normal.        Judgment: Judgment normal.     Assessment & Plan    1. HTN (hypertension), benign Stable. Continue bp medication as prescribed   2. Mixed hyperlipidemia Recent lipid panel normal. Continue lipitor as prescribed   3. Mixed disorder as reaction to stress Continue sertraline as prescribed   4. Attention deficit disorder, unspecified hyperactivity presence Ok to conitnue adderall 10 mg in morning as needed. Single  90 day prescription sent to her pharmacy today - amphetamine-dextroamphetamine (ADDERALL) 10 MG tablet; Take 1 tablet (10 mg total) by mouth every morning.  Dispense: 90 tablet; Refill: 0 .  Problem List Items Addressed This Visit       Cardiovascular and Mediastinum   HTN (hypertension), benign - Primary (Chronic)     Other   HLD (hyperlipidemia) (Chronic)   Mixed disorder as reaction to stress (Chronic)   Attention deficit disorder (Chronic)   Relevant Medications   amphetamine-dextroamphetamine (ADDERALL) 10 MG tablet     Return in about 3 months (around 12/07/2022) for medicare wellness, FBW at time of visit. she can also stay with me. Marland Kitchen         Ronnell Freshwater, NP  [facility] at [facility] [phone] (phone) [phone] (fax)  [place]

## 2022-11-05 ENCOUNTER — Other Ambulatory Visit: Payer: Self-pay

## 2022-11-05 DIAGNOSIS — I10 Essential (primary) hypertension: Secondary | ICD-10-CM

## 2022-11-05 MED ORDER — HYDROCHLOROTHIAZIDE 12.5 MG PO CAPS: 12.5000 mg | ORAL_CAPSULE | Freq: Every day | ORAL | 0 refills | Status: DC

## 2022-11-05 MED ORDER — LOSARTAN POTASSIUM 50 MG PO TABS: 50.0000 mg | ORAL_TABLET | Freq: Every day | ORAL | 0 refills | Status: DC

## 2022-12-13 ENCOUNTER — Encounter: Payer: Medicare Other | Admitting: Nurse Practitioner

## 2022-12-18 ENCOUNTER — Other Ambulatory Visit: Payer: Medicare Other

## 2022-12-19 ENCOUNTER — Other Ambulatory Visit: Payer: Medicare Other

## 2022-12-19 ENCOUNTER — Other Ambulatory Visit: Payer: Self-pay

## 2022-12-19 DIAGNOSIS — R7309 Other abnormal glucose: Secondary | ICD-10-CM | POA: Diagnosis not present

## 2022-12-19 DIAGNOSIS — Z0000 Encounter for general adult medical examination without abnormal findings: Secondary | ICD-10-CM

## 2022-12-19 DIAGNOSIS — Z130 Encounter for screening for diseases of the blood and blood-forming organs and certain disorders involving the immune mechanism: Secondary | ICD-10-CM

## 2022-12-19 DIAGNOSIS — Z1329 Encounter for screening for other suspected endocrine disorder: Secondary | ICD-10-CM | POA: Diagnosis not present

## 2022-12-19 DIAGNOSIS — I10 Essential (primary) hypertension: Secondary | ICD-10-CM | POA: Diagnosis not present

## 2022-12-19 DIAGNOSIS — Z1321 Encounter for screening for nutritional disorder: Secondary | ICD-10-CM | POA: Diagnosis not present

## 2022-12-19 DIAGNOSIS — Z13228 Encounter for screening for other metabolic disorders: Secondary | ICD-10-CM | POA: Diagnosis not present

## 2022-12-19 DIAGNOSIS — F339 Major depressive disorder, recurrent, unspecified: Secondary | ICD-10-CM | POA: Diagnosis not present

## 2022-12-19 DIAGNOSIS — E785 Hyperlipidemia, unspecified: Secondary | ICD-10-CM | POA: Diagnosis not present

## 2022-12-20 ENCOUNTER — Ambulatory Visit (INDEPENDENT_AMBULATORY_CARE_PROVIDER_SITE_OTHER): Payer: Medicare Other

## 2022-12-20 VITALS — Ht 61.0 in | Wt 123.0 lb

## 2022-12-20 DIAGNOSIS — Z0000 Encounter for general adult medical examination without abnormal findings: Secondary | ICD-10-CM

## 2022-12-20 LAB — LIPID PANEL
Chol/HDL Ratio: 2.9 ratio (ref 0.0–4.4)
Cholesterol, Total: 235 mg/dL — ABNORMAL HIGH (ref 100–199)
HDL: 82 mg/dL (ref >39)
LDL Chol Calc (NIH): 144 mg/dL — ABNORMAL HIGH (ref 0–99)
Triglycerides: 52 mg/dL (ref 0–149)
VLDL Cholesterol Cal: 9 mg/dL (ref 5–40)

## 2022-12-20 LAB — CBC
Hematocrit: 38.8 % (ref 34.0–46.6)
Hemoglobin: 12.8 g/dL (ref 11.1–15.9)
MCH: 30 pg (ref 26.6–33.0)
MCHC: 33 g/dL (ref 31.5–35.7)
MCV: 91 fL (ref 79–97)
Platelets: 320 10*3/uL (ref 150–450)
RBC: 4.27 x10E6/uL (ref 3.77–5.28)
RDW: 13.3 % (ref 11.7–15.4)
WBC: 2.6 10*3/uL — ABNORMAL LOW (ref 3.4–10.8)

## 2022-12-20 LAB — COMPREHENSIVE METABOLIC PANEL
ALT: 10 IU/L (ref 0–32)
AST: 15 IU/L (ref 0–40)
Albumin/Globulin Ratio: 2 (ref 1.2–2.2)
Albumin: 4.2 g/dL (ref 3.8–4.8)
Alkaline Phosphatase: 43 IU/L — ABNORMAL LOW (ref 44–121)
BUN/Creatinine Ratio: 22 (ref 12–28)
BUN: 17 mg/dL (ref 8–27)
Bilirubin Total: 0.3 mg/dL (ref 0.0–1.2)
CO2: 24 mmol/L (ref 20–29)
Calcium: 10 mg/dL (ref 8.7–10.3)
Chloride: 101 mmol/L (ref 96–106)
Creatinine, Ser: 0.76 mg/dL (ref 0.57–1.00)
Globulin, Total: 2.1 g/dL (ref 1.5–4.5)
Glucose: 99 mg/dL (ref 70–99)
Potassium: 4.4 mmol/L (ref 3.5–5.2)
Sodium: 138 mmol/L (ref 134–144)
Total Protein: 6.3 g/dL (ref 6.0–8.5)
eGFR: 80 mL/min/{1.73_m2} (ref >59)

## 2022-12-20 LAB — HEMOGLOBIN A1C
Est. average glucose Bld gHb Est-mCnc: 120 mg/dL
Hgb A1c MFr Bld: 5.8 % — ABNORMAL HIGH (ref 4.8–5.6)

## 2022-12-20 LAB — TSH: TSH: 2.3 u[IU]/mL (ref 0.450–4.500)

## 2022-12-20 NOTE — Patient Instructions (Addendum)
Denise Wolfe , Thank you for taking time to come for your Medicare Wellness Visit. I appreciate your ongoing commitment to your health goals. Please review the following plan we discussed and let me know if I can assist you in the future.   These are the goals we discussed:  Goals       No current goals (pt-stated)        This is a list of the screening recommended for you and due dates:  Health Maintenance  Topic Date Due   DTaP/Tdap/Td vaccine (2 - Tdap) 05/17/2014   COVID-19 Vaccine (5 - 2023-24 season) 01/05/2023*   Zoster (Shingles) Vaccine (1 of 2) 03/22/2023*   Hepatitis C Screening: USPSTF Recommendation to screen - Ages 18-79 yo.  12/20/2023*   Flu Shot  03/14/2023   Medicare Annual Wellness Visit  12/20/2023   Pneumonia Vaccine  Completed   DEXA scan (bone density measurement)  Completed   HPV Vaccine  Aged Out  *Topic was postponed. The date shown is not the original due date.    Advanced directives: Advance directive discussed with you today. Even though you declined this today, please call our office should you change your mind, and we can give you the proper paperwork for you to fill out.   Conditions/risks identified: None  Next appointment: Follow up in one year for your annual wellness visit    Preventive Care 65 Years and Older, Female Preventive care refers to lifestyle choices and visits with your health care provider that can promote health and wellness. What does preventive care include? A yearly physical exam. This is also called an annual well check. Dental exams once or twice a year. Routine eye exams. Ask your health care provider how often you should have your eyes checked. Personal lifestyle choices, including: Daily care of your teeth and gums. Regular physical activity. Eating a healthy diet. Avoiding tobacco and drug use. Limiting alcohol use. Practicing safe sex. Taking low-dose aspirin every day. Taking vitamin and mineral supplements as  recommended by your health care provider. What happens during an annual well check? The services and screenings done by your health care provider during your annual well check will depend on your age, overall health, lifestyle risk factors, and family history of disease. Counseling  Your health care provider may ask you questions about your: Alcohol use. Tobacco use. Drug use. Emotional well-being. Home and relationship well-being. Sexual activity. Eating habits. History of falls. Memory and ability to understand (cognition). Work and work Astronomer. Reproductive health. Screening  You may have the following tests or measurements: Height, weight, and BMI. Blood pressure. Lipid and cholesterol levels. These may be checked every 5 years, or more frequently if you are over 61 years old. Skin check. Lung cancer screening. You may have this screening every year starting at age 31 if you have a 30-pack-year history of smoking and currently smoke or have quit within the past 15 years. Fecal occult blood test (FOBT) of the stool. You may have this test every year starting at age 35. Flexible sigmoidoscopy or colonoscopy. You may have a sigmoidoscopy every 5 years or a colonoscopy every 10 years starting at age 77. Hepatitis C blood test. Hepatitis B blood test. Sexually transmitted disease (STD) testing. Diabetes screening. This is done by checking your blood sugar (glucose) after you have not eaten for a while (fasting). You may have this done every 1-3 years. Bone density scan. This is done to screen for osteoporosis. You may have  this done starting at age 11. Mammogram. This may be done every 1-2 years. Talk to your health care provider about how often you should have regular mammograms. Talk with your health care provider about your test results, treatment options, and if necessary, the need for more tests. Vaccines  Your health care provider may recommend certain vaccines, such  as: Influenza vaccine. This is recommended every year. Tetanus, diphtheria, and acellular pertussis (Tdap, Td) vaccine. You may need a Td booster every 10 years. Zoster vaccine. You may need this after age 30. Pneumococcal 13-valent conjugate (PCV13) vaccine. One dose is recommended after age 85. Pneumococcal polysaccharide (PPSV23) vaccine. One dose is recommended after age 51. Talk to your health care provider about which screenings and vaccines you need and how often you need them. This information is not intended to replace advice given to you by your health care provider. Make sure you discuss any questions you have with your health care provider. Document Released: 08/26/2015 Document Revised: 04/18/2016 Document Reviewed: 05/31/2015 Elsevier Interactive Patient Education  2017 [employer] Prevention in the Home Falls can cause injuries. They can happen to people of all ages. There are many things you can do to make your home safe and to help prevent falls. What can I do on the outside of my home? Regularly fix the edges of walkways and driveways and fix any cracks. Remove anything that might make you trip as you walk through a door, such as a raised step or threshold. Trim any bushes or trees on the path to your home. Use bright outdoor lighting. Clear any walking paths of anything that might make someone trip, such as rocks or tools. Regularly check to see if handrails are loose or broken. Make sure that both sides of any steps have handrails. Any raised decks and porches should have guardrails on the edges. Have any leaves, snow, or ice cleared regularly. Use sand or salt on walking paths during winter. Clean up any spills in your garage right away. This includes oil or grease spills. What can I do in the bathroom? Use night lights. Install grab bars by the toilet and in the tub and shower. Do not use towel bars as grab bars. Use non-skid mats or decals in the tub or  shower. If you need to sit down in the shower, use a plastic, non-slip stool. Keep the floor dry. Clean up any water that spills on the floor as soon as it happens. Remove soap buildup in the tub or shower regularly. Attach bath mats securely with double-sided non-slip rug tape. Do not have throw rugs and other things on the floor that can make you trip. What can I do in the bedroom? Use night lights. Make sure that you have a light by your bed that is easy to reach. Do not use any sheets or blankets that are too big for your bed. They should not hang down onto the floor. Have a firm chair that has side arms. You can use this for support while you get dressed. Do not have throw rugs and other things on the floor that can make you trip. What can I do in the kitchen? Clean up any spills right away. Avoid walking on wet floors. Keep items that you use a lot in easy-to-reach places. If you need to reach something above you, use a strong step stool that has a grab bar. Keep electrical cords out of the way. Do not use floor polish or  wax that makes floors slippery. If you must use wax, use non-skid floor wax. Do not have throw rugs and other things on the floor that can make you trip. What can I do with my stairs? Do not leave any items on the stairs. Make sure that there are handrails on both sides of the stairs and use them. Fix handrails that are broken or loose. Make sure that handrails are as long as the stairways. Check any carpeting to make sure that it is firmly attached to the stairs. Fix any carpet that is loose or worn. Avoid having throw rugs at the top or bottom of the stairs. If you do have throw rugs, attach them to the floor with carpet tape. Make sure that you have a light switch at the top of the stairs and the bottom of the stairs. If you do not have them, ask someone to add them for you. What else can I do to help prevent falls? Wear shoes that: Do not have high heels. Have  rubber bottoms. Are comfortable and fit you well. Are closed at the toe. Do not wear sandals. If you use a stepladder: Make sure that it is fully opened. Do not climb a closed stepladder. Make sure that both sides of the stepladder are locked into place. Ask someone to hold it for you, if possible. Clearly mark and make sure that you can see: Any grab bars or handrails. First and last steps. Where the edge of each step is. Use tools that help you move around (mobility aids) if they are needed. These include: Canes. Walkers. Scooters. Crutches. Turn on the lights when you go into a dark area. Replace any light bulbs as soon as they burn out. Set up your furniture so you have a clear path. Avoid moving your furniture around. If any of your floors are uneven, fix them. If there are any pets around you, be aware of where they are. Review your medicines with your doctor. Some medicines can make you feel dizzy. This can increase your chance of falling. Ask your doctor what other things that you can do to help prevent falls. This information is not intended to replace advice given to you by your health care provider. Make sure you discuss any questions you have with your health care provider. Document Released: 05/26/2009 Document Revised: 01/05/2016 Document Reviewed: 09/03/2014 Elsevier Interactive Patient Education  2017 [employer].

## 2022-12-20 NOTE — Progress Notes (Signed)
Subjective:   Denise Wolfe is a 82 y.o. female who presents for Medicare Annual (Subsequent) preventive examination.  Review of Systems    Virtual Visit via Telephone Note  I connected with  Denise Wolfe on 12/20/22 at  2:00 PM EDT by telephone and verified that I am speaking with the correct person using two identifiers.  Location: Patient: Home  Provider: Office Persons participating in the virtual visit: patient/Nurse Health Advisor   I discussed the limitations, risks, security and privacy concerns of performing an evaluation and management service by telephone and the availability of in person appointments. The patient expressed understanding and agreed to proceed.  Interactive audio and video telecommunications were attempted between this nurse and patient, however failed, due to patient having technical difficulties OR patient did not have access to video capability.  We continued and completed visit with audio only.  Some vital signs may be absent or patient reported.   Tillie Rung, LPN  Cardiac Risk Factors include: advanced age (>70men, >34 women);hypertension     Objective:    Today's Vitals   12/20/22 1404  Weight: 123 lb (55.8 kg)  Height: 5\' 1"  (1.549 m)   Body mass index is 23.24 kg/m.     12/20/2022    2:17 PM 10/09/2019    9:37 AM 11/28/2017    3:28 PM 07/23/2017   10:31 AM 05/14/2017    9:08 AM 04/27/2017   11:21 AM 04/16/2017    9:54 AM  Advanced Directives  Does Patient Have a Medical Advance Directive? No No No No No Yes No  Would patient like information on creating a medical advance directive? No - Patient declined No - Patient declined    No - Patient declined No - Patient declined    Current Medications (verified) Outpatient Encounter Medications as of 12/20/2022  Medication Sig   amphetamine-dextroamphetamine (ADDERALL) 10 MG tablet Take 1 tablet (10 mg total) by mouth every morning.   atorvastatin (LIPITOR) 80 MG tablet TAKE ONE-HALF TABLET  BY  MOUTH DAILY   fluticasone (FLONASE) 50 MCG/ACT nasal spray USE 1 SPRAY IN BOTH NOSTRILS  DAILY AS NEEDED FOR ALLERGIES   hydrochlorothiazide (MICROZIDE) 12.5 MG capsule Take 1 capsule (12.5 mg total) by mouth daily.   losartan (COZAAR) 50 MG tablet Take 1 tablet (50 mg total) by mouth daily.   sertraline (ZOLOFT) 50 MG tablet Take 1.5 tablets (75 mg total) by mouth daily.   vitamin B-12 (CYANOCOBALAMIN) 500 MCG tablet Take 1 tablet (500 mcg total) by mouth daily.   [DISCONTINUED] cholecalciferol (VITAMIN D) 400 units TABS tablet Take 400 Units by mouth daily. Patient is not sure of the units.   Facility-Administered Encounter Medications as of 12/20/2022  Medication   0.9 %  sodium chloride infusion    Allergies (verified) Ibuprofen   History: Past Medical History:  Diagnosis Date   Anemia    during childhood   Arthritis    hands, shoulder   Breast cancer (HCC) 03/2000   left side modified mastecomy   Cancer [facility])    breast, age 89; left side   Colon cancer (HCC) 02/11/2017   rectum-squamous cell carcinoma   Depression    Hypertension    Personal history of chemotherapy    Personal history of radiation therapy    Squamous cell carcinoma of rectum (HCC) 02/14/2017   Past Surgical History:  Procedure Laterality Date   ABDOMINAL HYSTERECTOMY     With bilateral oophorectomy. 2002.   BREAST BIOPSY Right 2004  benign   BREAST SURGERY Left 2001   biopsy   CATARACT EXTRACTION Bilateral 2017   IR FLUORO GUIDE CV LINE RIGHT  03/11/2017   IR FLUORO GUIDE CV LINE RIGHT  04/16/2017   IR US GUIDE VASC ACCESS RIGHT  03/11/2017   IR US GUIDE VASC ACCESS RIGHT  04/16/2017   MASTECTOMY MODIFIED RADICAL Left 2001   MENISCUS DEBRIDEMENT Right 2003   TUMOR EXCISION Right    thigh area   Family History  Problem Relation Age of Onset   Cancer Mother 19       breast   Breast cancer Mother    Heart disease Father    Colon cancer Neg Hx    Esophageal cancer Neg Hx    Rectal cancer Neg  Hx    Stomach cancer Neg Hx    Social History   Socioeconomic History   Marital status: Widowed    Spouse name: Not on file   Number of children: Not on file   Years of education: Not on file   Highest education level: Not on file  Occupational History   Not on file  Tobacco Use   Smoking status: Never   Smokeless tobacco: Never  Substance and Sexual Activity   Alcohol use: Yes    Alcohol/week: 14.0 standard drinks of alcohol    Types: 14 Glasses of wine per week   Drug use: No   Sexual activity: Not Currently  Other Topics Concern   Not on file  Social History Narrative   Not on file   Social Determinants of Health   Financial Resource Strain: Low Risk  (12/20/2022)   Overall Financial Resource Strain (CARDIA)    Difficulty of Paying Living Expenses: Not hard at all  Food Insecurity: No Food Insecurity (12/20/2022)   Hunger Vital Sign    Worried About Running Out of Food in the Last Year: Never true    Ran Out of Food in the Last Year: Never true  Transportation Needs: No Transportation Needs (12/20/2022)   PRAPARE - Administrator, Civil Service (Medical): No    Lack of Transportation (Non-Medical): No  Physical Activity: Inactive (12/20/2022)   Exercise Vital Sign    Days of Exercise per Week: 0 days    Minutes of Exercise per Session: 0 min  Stress: No Stress Concern Present (12/20/2022)   [employer] of Occupational Health - Occupational Stress Questionnaire    Feeling of Stress : Not at all  Social Connections: Socially Isolated (12/20/2022)   Social Connection and Isolation Panel [NHANES]    Frequency of Communication with Friends and Family: More than three times a week    Frequency of Social Gatherings with Friends and Family: More than three times a week    Attends Religious Services: Never    Database administrator or Organizations: No    Attends Banker Meetings: Never    Marital Status: Widowed    Tobacco Counseling Counseling  given: Not Answered   Clinical Intake:  Pre-visit preparation completed: Yes  Pain : No/denies pain     BMI - recorded: 23.24 Nutritional Status: BMI of 19-24  Normal Nutritional Risks: None Diabetes: No  How often do you need to have someone help you when you read instructions, pamphlets, or other written materials from your doctor or pharmacy?: 1 - Never  Diabetic?  No  Interpreter Needed?: No  Information entered by :: Theresa Mulligan LPN   Activities of Daily Living  12/20/2022    2:11 PM 12/20/2022    1:05 PM  In your present state of health, do you have any difficulty performing the following activities:  Hearing? 0 0  Vision? 0 0  Difficulty concentrating or making decisions? 1 1  Comment Dx: ADD   Walking or climbing stairs? 1 1  Comment Due to orhtopedic conditions   Dressing or bathing? 0 0  Doing errands, shopping? 0 0  Preparing Food and eating ? N N  Using the Toilet? N N  In the past six months, have you accidently leaked urine? Y Y  Comment Does not wear pads or breifs   Do you have problems with loss of bowel control? N N  Managing your Medications? N N  Managing your Finances? N N  Housekeeping or managing your Housekeeping? N N    Patient Care Team: Carlean Jews, NP as PCP - General (Family Medicine) Monica Becton, MD as Consulting Physician (Family Medicine) Malachy Mood, MD as Consulting Physician (Hematology) Romie Levee, MD as Consulting Physician (General Surgery)  Indicate any recent Medical Services you may have received from other than [facility] providers in the past year (date may be approximate).     Assessment:   This is a routine wellness examination for Denise Wolfe.  Hearing/Vision screen Hearing Screening - Comments:: Denies hearing difficulties   Vision Screening - Comments:: Wears rx glasses - up to date with routine eye exams with  [facility]  Dietary issues and exercise activities discussed: Exercise  limited by: None identified   Goals Addressed               This Visit's Progress     No current goals (pt-stated)         Depression Screen    12/20/2022    2:10 PM 06/07/2022    3:12 PM 03/07/2022    3:33 PM 12/06/2021    3:04 PM 11/23/2021    3:46 PM 08/29/2021    1:04 PM 06/02/2021    8:57 AM  PHQ 2/9 Scores  PHQ - 2 Score 0 0 0 0 0 0 0  PHQ- 9 Score  3 3 2 3 2  0    Fall Risk    12/20/2022    1:05 PM 09/07/2022   10:42 AM 12/06/2021    9:24 AM 08/29/2021    1:04 PM 06/02/2021    8:57 AM  Fall Risk   Falls in the past year? 0 0 0 0 0  Number falls in past yr:  0  0 0  Injury with Fall?  0  0 0  Risk for fall due to :    No Fall Risks No Fall Risks  Follow up    Falls evaluation completed Falls evaluation completed    FALL RISK PREVENTION PERTAINING TO THE HOME:  Any stairs in or around the home? Yes  If so, are there any without handrails? No  Home free of loose throw rugs in walkways, pet beds, electrical cords, etc? Yes  Adequate lighting in your home to reduce risk of falls? Yes   ASSISTIVE DEVICES UTILIZED TO PREVENT FALLS:  Life alert? No  Use of a cane, walker or w/c? No  Grab bars in the bathroom? No  Shower chair or bench in shower? No  Elevated toilet seat or a handicapped toilet? No   TIMED UP AND GO:  Was the test performed? No . Audio Visit   Cognitive Function:  12/20/2022    2:17 PM 12/06/2021    2:56 PM 11/21/2020   10:01 AM 12/15/2018    8:17 AM  6CIT Screen  What Year? 0 points 0 points 0 points 0 points  What month? 0 points 0 points 0 points 0 points  What time? 0 points 0 points 0 points 0 points  Count back from 20 0 points 0 points 0 points 0 points  Months in reverse 0 points 0 points 0 points 0 points  Repeat phrase 0 points 2 points 0 points 0 points  Total Score 0 points 2 points 0 points 0 points    Immunizations Immunization History  Administered Date(s) Administered   Influenza, High Dose Seasonal PF  07/23/2017, 06/26/2018   PFIZER(Purple Top)SARS-COV-2 Vaccination 09/24/2019, 10/19/2019, 06/26/2020, 03/06/2021   Pneumococcal Conjugate-13 12/27/2016   Pneumococcal Polysaccharide-23 01/19/2009   Td 05/17/2004    TDAP status: Due, Education has been provided regarding the importance of this vaccine. Advised may receive this vaccine at local pharmacy or Health Dept. Aware to provide a copy of the vaccination record if obtained from local pharmacy or Health Dept. Verbalized acceptance and understanding.  Flu Vaccine status: Up to date  Pneumococcal vaccine status: Up to date  Covid-19 vaccine status: Completed vaccines  Qualifies for Shingles Vaccine? Yes   Zostavax completed No   Shingrix Completed?: No.    Education has been provided regarding the importance of this vaccine. Patient has been advised to call insurance company to determine out of pocket expense if they have not yet received this vaccine. Advised may also receive vaccine at local pharmacy or Health Dept. Verbalized acceptance and understanding.  Screening Tests Health Maintenance  Topic Date Due   DTaP/Tdap/Td (2 - Tdap) 05/17/2014   COVID-19 Vaccine (5 - 2023-24 season) 01/05/2023 (Originally 04/13/2022)   Zoster Vaccines- Shingrix (1 of 2) 03/22/2023 (Originally 12/24/1959)   Hepatitis C Screening  12/20/2023 (Originally 12/24/1958)   INFLUENZA VACCINE  03/14/2023   Medicare Annual Wellness (AWV)  12/20/2023   Pneumonia Vaccine 40+ Years old  Completed   DEXA SCAN  Completed   HPV VACCINES  Aged Out    Health Maintenance  Health Maintenance Due  Topic Date Due   DTaP/Tdap/Td (2 - Tdap) 05/17/2014    Colorectal cancer screening: No longer required.   Mammogram status: No longer required due to Age.  Bone Density status: Completed 12/13/20. Results reflect: Bone density results: OSTEOPOROSIS. Repeat every   years.  Lung Cancer Screening: (Low Dose CT Chest recommended if Age 36-80 years, 30 pack-year currently  smoking OR have quit w/in 15years.) does not qualify.     Additional Screening:  Hepatitis C Screening: does not qualify; Completed   Vision Screening: Recommended annual ophthalmology exams for early detection of glaucoma and other disorders of the eye. Is the patient up to date with their annual eye exam?  Yes  Who is the provider or what is the name of the office in which the patient attends annual eye exams? [facility] If pt is not established with a provider, would they like to be referred to a provider to establish care? No .   Dental Screening: Recommended annual dental exams for proper oral hygiene  Community Resource Referral / Chronic Care Management:  CRR required this visit?  No   CCM required this visit?  No      Plan:     I have personally reviewed and noted the following in the patient's chart:   Medical  and social history Use of alcohol, tobacco or illicit drugs  Current medications and supplements including opioid prescriptions. Patient is not currently taking opioid prescriptions. Functional ability and status Nutritional status Physical activity Advanced directives List of other physicians Hospitalizations, surgeries, and ER visits in previous 12 months Vitals Screenings to include cognitive, depression, and falls Referrals and appointments  In addition, I have reviewed and discussed with patient certain preventive protocols, quality metrics, and best practice recommendations. A written personalized care plan for preventive services as well as general preventive health recommendations were provided to patient.     Tillie Rung, LPN   08/18/1094   Nurse Notes Patient request f/u with concerns of medication  ADDERALL refills.

## 2022-12-21 ENCOUNTER — Telehealth: Payer: Self-pay

## 2022-12-21 DIAGNOSIS — F988 Other specified behavioral and emotional disorders with onset usually occurring in childhood and adolescence: Secondary | ICD-10-CM

## 2022-12-21 MED ORDER — AMPHETAMINE-DEXTROAMPHETAMINE 10 MG PO TABS: 10.0000 mg | ORAL_TABLET | ORAL | 0 refills | Status: DC

## 2022-12-21 NOTE — Telephone Encounter (Signed)
Sent refill to [facility] pharmacy as requested, will need 79-month appointment for additional refills.

## 2022-12-21 NOTE — Addendum Note (Signed)
Addended by: Saralyn Pilar on: 12/21/2022 10:43 AM   Modules accepted: Orders

## 2022-12-21 NOTE — Telephone Encounter (Signed)
Pt is requesting a refill on:  amphetamine-dextroamphetamine (ADDERALL) 10 MG tablet (Expired)    Pharmacy: [id number] - [address] [place] - [address]   LOV 12/20/22 ROV 03/22/23

## 2023-01-06 ENCOUNTER — Other Ambulatory Visit: Payer: Self-pay | Admitting: Nurse Practitioner

## 2023-01-06 DIAGNOSIS — I10 Essential (primary) hypertension: Secondary | ICD-10-CM

## 2023-02-11 NOTE — Progress Notes (Signed)
Reviewed with patient during visit

## 2023-02-19 ENCOUNTER — Telehealth: Payer: Self-pay | Admitting: *Deleted

## 2023-02-19 NOTE — Telephone Encounter (Deleted)
Good afternoon, we have you scheduled with the doctor for your appointment.

## 2023-02-19 NOTE — Telephone Encounter (Signed)
LVM for pt to call office to reschedule appointment that is scheduled on 03/22/23 due to provider not being in office.  Will also send mychart message.

## 2023-03-22 ENCOUNTER — Encounter: Payer: Self-pay | Admitting: Family Medicine

## 2023-03-22 ENCOUNTER — Ambulatory Visit (INDEPENDENT_AMBULATORY_CARE_PROVIDER_SITE_OTHER): Payer: Medicare Other | Admitting: Family Medicine

## 2023-03-22 VITALS — BP 137/76 | HR 87 | Ht 61.0 in | Wt 121.8 lb

## 2023-03-22 DIAGNOSIS — R0789 Other chest pain: Secondary | ICD-10-CM | POA: Insufficient documentation

## 2023-03-22 DIAGNOSIS — C20 Malignant neoplasm of rectum: Secondary | ICD-10-CM | POA: Diagnosis not present

## 2023-03-22 DIAGNOSIS — J3089 Other allergic rhinitis: Secondary | ICD-10-CM | POA: Diagnosis not present

## 2023-03-22 DIAGNOSIS — K625 Hemorrhage of anus and rectum: Secondary | ICD-10-CM | POA: Insufficient documentation

## 2023-03-22 MED ORDER — FLUTICASONE PROPIONATE 50 MCG/ACT NA SUSP: 1.0000 | Freq: Every day | NASAL | 0 refills | Status: AC

## 2023-03-22 MED ORDER — DICLOFENAC SODIUM 1 % EX GEL: 2.0000 g | Freq: Four times a day (QID) | CUTANEOUS | 1 refills | Status: AC

## 2023-03-22 NOTE — Assessment & Plan Note (Signed)
Likely costochondritis related to her fall.  No concern for displaced fracture or pneumothorax based on exam.  Patient declined chest x-ray.  Advised her if it worsens to let us know and we can order chest x-ray.  Advised her to to use Voltaren gel as needed.  Has a allergy to ibuprofen.  Advised her to use a small amount of Voltaren gel to see if she develops rash prior to applying it on her chest.

## 2023-03-22 NOTE — Progress Notes (Unsigned)
Established Patient Office Visit  Subjective   Patient ID: Denise Wolfe, female    DOB: 1940/09/04  Age: 82 y.o. MRN: [id number]  Chief Complaint  Patient presents with   Medical Management of Chronic Issues    HPI  Patient has no issues with her chronic medications.  Patient states she tripped over a vine on Sunday and fell.  Since then has had some tenderness..  Chest wall to the left of her sternum.  No difficulty breathing.  Pain is worse with deep breathing, cough, certain movements.  Has not taken any medication for the pain.  Patient had an episode on Tuesday in which she had to bowel movements with bright red blood.  Patient said the blood was "a lot".  It was nonpainful.  Has not had any further rectal bleeding since that time.  No dizziness or weakness.  She has a history of rectal squamous cell carcinoma status postchemotherapy and radiation treatment.  Has not seen oncology since 2020.  Cancer was initially found on colonoscopy in 2018.  Patient would like to have a repeat colonoscopy because of this.  If she needs to be referred to oncology again, she would prefer to be seen by a new oncologist.    The ASCVD Risk score (Arnett DK, et al., 2019) failed to calculate for the following reasons:   The 2019 ASCVD risk score is only valid for ages 73 to 52  Health Maintenance Due  Topic Date Due   Zoster Vaccines- Shingrix (1 of 2) Never done   DTaP/Tdap/Td (2 - Tdap) 05/17/2014   COVID-19 Vaccine (5 - 2023-24 season) 04/13/2022   INFLUENZA VACCINE  03/14/2023      Objective:     BP 137/76   Pulse 87   Ht 5\' 1"  (1.549 m)   Wt 121 lb 12.8 oz (55.2 kg)   SpO2 98%   BMI 23.01 kg/m  {Vitals History (Optional):23777}  Physical Exam General: Alert and oriented CV: Regular rate and rhythm Pulmonary: Lungs clear bilaterally MSK: Tenderness to palpation left of the sternum near ribs 3 and 4.  No masses or deformities noted.   No results found for any visits on  03/22/23.      Assessment & Plan:   BRBPR (bright red blood per rectum) Assessment & Plan: Possibly internal hemorrhoids.  Patient declined digital rectal exam.  Given her history of squamous cell cancer, will send in referral to her gastroenterologist.  Advised her she can call them as well to see if they can schedule an appointment without a referral.  Will check CBC for anemia.  Orders: -     CBC -     Ambulatory referral to Gastroenterology  Environmental and seasonal allergies -     Fluticasone Propionate; Place 1 spray into both nostrils daily. USE 1 SPRAY IN BOTH NOSTRILS  DAILY AS NEEDED FOR ALLERGIES  Dispense: 32 g; Refill: 0  Squamous cell carcinoma of rectum (HCC) -     Ambulatory referral to Gastroenterology  Chest wall tenderness Assessment & Plan: Likely costochondritis related to her fall.  No concern for displaced fracture or pneumothorax based on exam.  Patient declined chest x-ray.  Advised her if it worsens to let us know and we can order chest x-ray.  Advised her to to use Voltaren gel as needed.  Has a allergy to ibuprofen.  Advised her to use a small amount of Voltaren gel to see if she develops rash prior to applying it on her  chest.   Other orders -     Diclofenac Sodium; Apply 2 g topically 4 (four) times daily.  Dispense: 150 g; Refill: 1     Return in about 3 months (around 06/22/2023) for adhd.    Sandre Kitty, MD

## 2023-03-22 NOTE — Patient Instructions (Addendum)
It was nice to see you today,  We addressed the following topics today: -  I will send in your referral to the gastroenterologist but you can also call Dr. Marvell Fuller office to see if you can schedule it on your own - I will check your hemoglobin for anemia.  We will let you know the results when we get them.   - I have sent in an order for voltaren gel to use on your chest.  If you have difficulty breathing or pain gets worse let us know and we will get a chest x ray.    Have a great day,  Frederic Jericho, MD

## 2023-03-22 NOTE — Assessment & Plan Note (Signed)
Possibly internal hemorrhoids.  Patient declined digital rectal exam.  Given her history of squamous cell cancer, will send in referral to her gastroenterologist.  Advised her she can call them as well to see if they can schedule an appointment without a referral.  Will check CBC for anemia.

## 2023-03-26 ENCOUNTER — Other Ambulatory Visit: Payer: Self-pay

## 2023-03-26 DIAGNOSIS — E782 Mixed hyperlipidemia: Secondary | ICD-10-CM

## 2023-03-26 MED ORDER — ATORVASTATIN CALCIUM 80 MG PO TABS
40.0000 mg | ORAL_TABLET | Freq: Every day | ORAL | 3 refills | Status: AC
Start: 2023-03-26 — End: ?

## 2023-03-28 ENCOUNTER — Ambulatory Visit (INDEPENDENT_AMBULATORY_CARE_PROVIDER_SITE_OTHER): Payer: Medicare Other | Admitting: Internal Medicine

## 2023-03-28 ENCOUNTER — Encounter: Payer: Self-pay | Admitting: Internal Medicine

## 2023-03-28 VITALS — BP 122/70 | HR 97 | Ht 61.0 in | Wt 121.0 lb

## 2023-03-28 DIAGNOSIS — C20 Malignant neoplasm of rectum: Secondary | ICD-10-CM

## 2023-03-28 DIAGNOSIS — R634 Abnormal weight loss: Secondary | ICD-10-CM | POA: Diagnosis not present

## 2023-03-28 DIAGNOSIS — K649 Unspecified hemorrhoids: Secondary | ICD-10-CM

## 2023-03-28 MED ORDER — HYDROCORTISONE (PERIANAL) 2.5 % EX CREA: 1.0000 | TOPICAL_CREAM | Freq: Two times a day (BID) | CUTANEOUS | 1 refills | Status: AC | PRN

## 2023-03-28 NOTE — Progress Notes (Signed)
Denise Wolfe 82 y.o. 1940-12-06 [id number]  Assessment & Plan:   Encounter Diagnoses  Name Primary?   Bleeding hemorrhoids Yes   Decreased weight    Squamous cell carcinoma of rectum (HCC)-treated with chemo/XRT, in remission     I think the bleeding is from hemorrhoids we will treat that with hydrocortisone cream as needed.  Should she have more significant bleeding or changes in bowel habits she knows to contact me and we can consider additional evaluation.  Regarding her weight it has fluctuated she was at 124 pounds last year she will monitor this not getting a history to suggest she needs other investigation at this time.  CC: Sandre Kitty, MD  Subjective:   Chief Complaint: Rectal bleeding  HPI 82 year old white woman with a history of squamous cell carcinoma the rectum treated with chemo and radiation therapy, has been followed up by Dr. Maisie Fus, who has had some intermittent rectal bleeding minor off and on but more recently said she had a large amount of blood in the commode.  She saw primary care a CBC was normal and she was referred for evaluation.  She has not had any bleeding in a week or more.  Bowel habits are regular.  Weight is down a little bit she does not think her appetite is different there is no early satiety or abdominal pain. GI review of systems is otherwise negative.  Wt Readings from Last 3 Encounters:  03/28/23 121 lb (54.9 kg)  03/22/23 121 lb 12.8 oz (55.2 kg)  12/20/22 123 lb (55.8 kg)    Colonoscopy 72/18 - One 12 to 15 mm polyp in the rectum (vs proximal                            anus). Biopsied. Firm, - just inside anal verge or                            on it?                           - One 2 mm polyp in the descending colon, removed                            with a cold biopsy forceps. Resected and retrieved.                           - The examination was otherwise normal on direct                            and retroflexion  views.  1. Colon, biopsy, descending, polyp - TUBULAR ADENOMA (ONE FRAGMENT). - NO HIGH GRADE DYSPLASIA OR MALIGNANCY. 2. Colon, polyp(s), rectum, polyp - biopsy only - SQUAMOUS CELL CARCINOMA, BASALOID-TYPE. - SEE MICROSCOPIC DESCRIPTION. Allergies  Allergen Reactions   Ibuprofen Rash   Current Meds  Medication Sig   atorvastatin (LIPITOR) 80 MG tablet Take 0.5 tablets (40 mg total) by mouth daily.   diclofenac Sodium (VOLTAREN) 1 % GEL Apply 2 g topically 4 (four) times daily.   fluticasone (FLONASE) 50 MCG/ACT nasal spray Place 1 spray into both nostrils daily. USE 1 SPRAY IN BOTH NOSTRILS  DAILY AS NEEDED FOR ALLERGIES   hydrochlorothiazide (MICROZIDE)  12.5 MG capsule TAKE 1 CAPSULE BY MOUTH DAILY   hydrocortisone (ANUSOL-HC) 2.5 % rectal cream Place 1 Application rectally 2 (two) times daily as needed for hemorrhoids (bleeding).   losartan (COZAAR) 50 MG tablet TAKE 1 TABLET BY MOUTH DAILY   sertraline (ZOLOFT) 50 MG tablet Take 1.5 tablets (75 mg total) by mouth daily.   vitamin B-12 (CYANOCOBALAMIN) 500 MCG tablet Take 1 tablet (500 mcg total) by mouth daily.   Current Facility-Administered Medications for the 03/28/23 encounter (Office Visit) with Iva Boop, MD  Medication   0.9 %  sodium chloride infusion   Past Medical History:  Diagnosis Date   Anemia    during childhood   Arthritis    hands, shoulder   Breast cancer (HCC) 03/2000   left side modified mastecomy   Cancer [facility])    breast, age 27; left side   Colon cancer (HCC) 02/11/2017   rectum-squamous cell carcinoma   Depression    Hypertension    Personal history of chemotherapy    Personal history of radiation therapy    Squamous cell carcinoma of rectum (HCC) 02/14/2017   Past Surgical History:  Procedure Laterality Date   ABDOMINAL HYSTERECTOMY     With bilateral oophorectomy. 2002.   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  Never smoker 1-2 alcoholic drinks a day no other tobacco or drug use.  No caffeine.   family history includes Breast cancer (age of onset: 56) in her mother; Heart disease in her father.   Review of Systems   Objective:   Physical Exam @BP  122/70   Pulse 97   Ht 5\' 1"  (1.549 m)   Wt 121 lb (54.9 kg)   BMI 22.86 kg/m @  General:  NAD Eyes:   anicteric Lungs:  clear Heart::  S1S2 no rubs, murmurs or gallops Abdomen:  soft and nontender, BS+ Ext:   no edema, cyanosis or clubbing  Patti [place], CMA present.  Rectal - inspection shows anterior tag/externalhemorrhoid, DRE is NL   Anoscopy  Internal hemorrhoids RP andLL w/ stigmata of bleeding - Gr 1    Data Reviewed:  See HPI

## 2023-03-28 NOTE — Patient Instructions (Signed)
Contact us if you have any more concerns about your rectal bleeding.   We have sent the following medications to your pharmacy for you to pick up at your convenience: Hydrocortisone cream  _______________________________________________________  If your blood pressure at your visit was 140/90 or greater, please contact your primary care physician to follow up on this.  _______________________________________________________  If you are age 81 or older, your body mass index should be between 23-30. Your Body mass index is 22.86 kg/m. If this is out of the aforementioned range listed, please consider follow up with your Primary Care Provider.  If you are age 58 or younger, your body mass index should be between 19-25. Your Body mass index is 22.86 kg/m. If this is out of the aformentioned range listed, please consider follow up with your Primary Care Provider.   ________________________________________________________  The [facility] providers would like to encourage you to use [facility] to communicate with providers for non-urgent requests or questions.  Due to long hold times on the telephone, sending your provider a message by [facility] may be a faster and more efficient way to get a response.  Please allow 48 business hours for a response.  Please remember that this is for non-urgent requests.  _______________________________________________________   I appreciate the opportunity to care for you. Stan Head, MD, [facility]

## 2023-05-07 ENCOUNTER — Other Ambulatory Visit: Payer: Self-pay

## 2023-05-07 ENCOUNTER — Other Ambulatory Visit: Payer: Self-pay | Admitting: Family Medicine

## 2023-05-07 DIAGNOSIS — F430 Acute stress reaction: Secondary | ICD-10-CM

## 2023-05-07 MED ORDER — SERTRALINE HCL 50 MG PO TABS
75.0000 mg | ORAL_TABLET | Freq: Every day | ORAL | 3 refills | Status: AC
Start: 2023-05-07 — End: ?

## 2023-06-24 ENCOUNTER — Ambulatory Visit: Payer: Medicare Other | Admitting: Family Medicine

## 2023-06-24 NOTE — Progress Notes (Deleted)
   Established Patient Office Visit  Subjective   Patient ID: Denise Wolfe, female    DOB: [birth year]  Age: 82 y.o. MRN: [id number]  No chief complaint on file.   HPI  ADHD-how often is she taking?  Bright red blood?   The ASCVD Risk score (Arnett DK, et al., 2019) failed to calculate for the following reasons:   The 2019 ASCVD risk score is only valid for ages 49 to 75  Health Maintenance Due  Topic Date Due   Zoster Vaccines- Shingrix (1 of 2) Never done   DTaP/Tdap/Td (2 - Tdap) 05/17/2014   INFLUENZA VACCINE  03/14/2023   COVID-19 Vaccine (5 - 2023-24 season) 04/14/2023      Objective:     There were no vitals taken for this visit. {Vitals History (Optional):23777}  Physical Exam   No results found for any visits on 06/24/23.      Assessment & Plan:   There are no diagnoses linked to this encounter.   No follow-ups on file.    Sandre Kitty, MD

## 2024-01-09 ENCOUNTER — Other Ambulatory Visit: Payer: Self-pay

## 2024-01-09 ENCOUNTER — Ambulatory Visit: Payer: Medicare Other

## 2024-01-09 ENCOUNTER — Other Ambulatory Visit: Payer: Self-pay | Admitting: Family Medicine

## 2024-01-09 DIAGNOSIS — Z0000 Encounter for general adult medical examination without abnormal findings: Secondary | ICD-10-CM | POA: Diagnosis not present

## 2024-01-09 DIAGNOSIS — I10 Essential (primary) hypertension: Secondary | ICD-10-CM

## 2024-01-09 MED ORDER — LOSARTAN POTASSIUM 50 MG PO TABS
50.0000 mg | ORAL_TABLET | Freq: Every day | ORAL | 0 refills | Status: DC
Start: 2024-01-09 — End: 2024-01-09

## 2024-01-09 MED ORDER — HYDROCHLOROTHIAZIDE 12.5 MG PO CAPS: 12.5000 mg | ORAL_CAPSULE | Freq: Every day | ORAL | 0 refills | Status: DC

## 2024-01-09 MED ORDER — LOSARTAN POTASSIUM 50 MG PO TABS: 50.0000 mg | ORAL_TABLET | Freq: Every day | ORAL | 0 refills | Status: DC

## 2024-01-09 NOTE — Patient Instructions (Signed)
 Denise Wolfe , Thank you for taking time out of your busy schedule to complete your Annual Wellness Visit with me. I enjoyed our conversation and look forward to speaking with you again next year. I, as well as your care team,  appreciate your ongoing commitment to your health goals. Please review the following plan we discussed and let me know if I can assist you in the future. Your Game plan/ To Do List    Referrals: If you haven't heard from the office you've been referred to, please reach out to them at the phone provided.  N/a Follow up Visits: Next Medicare AWV with our clinical staff: moving to [place]    Have you seen your provider in the last 6 months (3 months if uncontrolled diabetes)? No Next Office Visit with your provider: states will be moving to [place] . If does not move soon as planned she will call to make an appointment  Clinician Recommendations:  Aim for 30 minutes of exercise or brisk walking, 6-8 glasses of water, and 5 servings of fruits and vegetables each day.       This is a list of the screening recommended for you and due dates:  Health Maintenance  Topic Date Due   Zoster (Shingles) Vaccine (1 of 2) Never done   DTaP/Tdap/Td vaccine (2 - Tdap) 05/17/2014   COVID-19 Vaccine (5 - 2024-25 season) 04/14/2023   Flu Shot  03/13/2024   Medicare Annual Wellness Visit  01/08/2025   Pneumonia Vaccine  Completed   DEXA scan (bone density measurement)  Completed   HPV Vaccine  Aged Out   Meningitis B Vaccine  Aged Out    Advanced directives: (ACP Link)Information on Advanced Care Planning can be found at [place]  Best boy Advance Health Care Directives Advance Health Care Directives. [email or web address]  Advance Care Planning is important because it:  [x]  Makes sure you receive the medical care that is consistent with your values, goals, and preferences  [x]  It provides guidance to your family and loved ones and reduces their decisional burden about whether or not  they are making the right decisions based on your wishes.  Follow the link provided in your after visit summary or read over the paperwork we have mailed to you to help you started getting your Advance Directives in place. If you need assistance in completing these, please reach out to us  so that we can help you!  See attachments for Preventive Care and Fall Prevention Tips.

## 2024-01-09 NOTE — Progress Notes (Signed)
 Subjective:   Denise Wolfe is a 83 y.o. who presents for a Medicare Wellness preventive visit.  As a reminder, Annual Wellness Visits don't include a physical exam, and some assessments may be limited, especially if this visit is performed virtually. We may recommend an in-person follow-up visit with your provider if needed.  Visit Complete: Virtual I connected with  Denise Wolfe on 01/09/24 by a audio enabled telemedicine application and verified that I am speaking with the correct person using two identifiers.  Patient Location: Home  Provider Location: Home Office  I discussed the limitations of evaluation and management by telemedicine. The patient expressed understanding and agreed to proceed.  Vital Signs: Because this visit was a virtual/telehealth visit, some criteria may be missing or patient reported. Any vitals not documented were not able to be obtained and vitals that have been documented are patient reported.  VideoError- Librarian, academic were attempted between this provider and patient, however failed, due to patient having technical difficulties OR patient did not have access to video capability.  We continued and completed visit with audio only.   Persons Participating in Visit: Patient.  AWV Questionnaire: No: Patient Medicare AWV questionnaire was not completed prior to this visit.  Cardiac Risk Factors include: advanced age (>74men, >14 women);dyslipidemia;hypertension     Objective:     Today's Vitals   01/09/24 1541  PainSc: 4    There is no height or weight on file to calculate BMI.     01/09/2024    3:48 PM 12/20/2022    2:17 PM 10/09/2019    9:37 AM 11/28/2017    3:28 PM 07/23/2017   10:31 AM 05/14/2017    9:08 AM 04/27/2017   11:21 AM  Advanced Directives  Does Patient Have a Medical Advance Directive? No No No No No No Yes  Would patient like information on creating a medical advance directive? No - Patient declined No  - Patient declined No - Patient declined    No - Patient declined    Current Medications (verified) Outpatient Encounter Medications as of 01/09/2024  Medication Sig   fluticasone  (FLONASE ) 50 MCG/ACT nasal spray Place 1 spray into both nostrils daily. USE 1 SPRAY IN BOTH NOSTRILS  DAILY AS NEEDED FOR ALLERGIES   hydrochlorothiazide  (MICROZIDE ) 12.5 MG capsule Take 1 capsule (12.5 mg total) by mouth daily.   hydrocortisone  (ANUSOL -HC) 2.5 % rectal cream Place 1 Application rectally 2 (two) times daily as needed for hemorrhoids (bleeding).   losartan  (COZAAR ) 50 MG tablet Take 1 tablet (50 mg total) by mouth daily.   sertraline  (ZOLOFT ) 50 MG tablet Take 1.5 tablets (75 mg total) by mouth daily.   vitamin B-12 (CYANOCOBALAMIN ) 500 MCG tablet Take 1 tablet (500 mcg total) by mouth daily.   atorvastatin  (LIPITOR) 80 MG tablet Take 0.5 tablets (40 mg total) by mouth daily. (Patient not taking: Reported on 01/09/2024)   diclofenac  Sodium (VOLTAREN ) 1 % GEL Apply 2 g topically 4 (four) times daily. (Patient not taking: Reported on 01/09/2024)   No facility-administered encounter medications on file as of 01/09/2024.    Allergies (verified) Ibuprofen   History: Past Medical History:  Diagnosis Date   Anemia    during childhood   Arthritis    hands, shoulder   Breast cancer (HCC) 03/2000   left side modified mastecomy   Cancer [facility])    breast, age 68; left side   Colon cancer (HCC) 02/11/2017   rectum-squamous cell carcinoma   Depression  Hypertension    Personal history of chemotherapy    Personal history of radiation therapy    Squamous cell carcinoma of rectum (HCC) 02/14/2017   Past Surgical History:  Procedure Laterality Date   ABDOMINAL HYSTERECTOMY     With bilateral oophorectomy. 2002.   BREAST BIOPSY Right 2004   benign   BREAST SURGERY Left 2001   biopsy   CATARACT EXTRACTION Bilateral 2017   IR FLUORO GUIDE CV LINE RIGHT  03/11/2017   IR FLUORO GUIDE CV LINE RIGHT   04/16/2017   IR US  GUIDE VASC ACCESS RIGHT  03/11/2017   IR US  GUIDE VASC ACCESS RIGHT  04/16/2017   MASTECTOMY MODIFIED RADICAL Left 2001   MENISCUS DEBRIDEMENT Right 2003   TUMOR EXCISION Right    thigh area   Family History  Problem Relation Age of Onset   Breast cancer Mother 57   Heart disease Father    Colon cancer Neg Hx    Esophageal cancer Neg Hx    Rectal cancer Neg Hx    Stomach cancer Neg Hx    Social History   Socioeconomic History   Marital status: Widowed    Spouse name: Not on file   Number of children: Not on file   Years of education: Not on file   Highest education level: Not on file  Occupational History   Not on file  Tobacco Use   Smoking status: Never   Smokeless tobacco: Never  Vaping Use   Vaping status: Never Used  Substance and Sexual Activity   Alcohol use: Yes    Alcohol/week: 14.0 standard drinks of alcohol    Types: 14 Glasses of wine per week   Drug use: No   Sexual activity: Not Currently  Other Topics Concern   Not on file  Social History Narrative   Patient is widowed 1 son 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  Never smoker 1-2 alcoholic drinks a day no other tobacco or drug use.  No caffeine.   Social Drivers of Corporate investment banker Strain: Low Risk  (01/09/2024)   Overall Financial Resource Strain (CARDIA)    Difficulty of Paying Living Expenses: Not hard at all  Food Insecurity: No Food Insecurity (01/09/2024)   Hunger Vital Sign    Worried About Running Out of Food in the Last Year: Never true    Ran Out of Food in the Last Year: Never true  Transportation Needs: No Transportation Needs (01/09/2024)   PRAPARE - Administrator, Civil Service (Medical): No    Lack of Transportation (Non-Medical): No  Physical Activity: Inactive (01/09/2024)   Exercise Vital Sign    Days of Exercise per Week: 0 days    Minutes of Exercise per Session: 0 min  Stress: No Stress Concern Present (01/09/2024)   [employer] of  Occupational Health - Occupational Stress Questionnaire    Feeling of Stress : Not at all  Social Connections: Socially Isolated (01/09/2024)   Social Connection and Isolation Panel [NHANES]    Frequency of Communication with Friends and Family: Three times a week    Frequency of Social Gatherings with Friends and Family: Never    Attends Religious Services: Never    Database administrator or Organizations: No    Attends Banker Meetings: Never    Marital Status: Widowed    Tobacco Counseling Counseling given: Not Answered    Clinical Intake:  Pre-visit preparation completed: Yes  Pain : 0-10 Pain  Score: 4  Pain Type: Chronic pain Pain Location: Knee Pain Orientation: Left, Right Pain Descriptors / Indicators: Aching Pain Onset: More than a month ago Pain Frequency: Constant     Nutritional Risks: None Diabetes: No  Lab Results  Component Value Date   HGBA1C 5.8 (H) 12/19/2022   HGBA1C 5.1 07/11/2016     How often do you need to have someone help you when you read instructions, pamphlets, or other written materials from your doctor or pharmacy?: 1 - Never  Interpreter Needed?: No  Information entered by :: NAllen LPN   Activities of Daily Living     01/09/2024    3:42 PM  In your present state of health, do you have any difficulty performing the following activities:  Hearing? 0  Vision? 0  Difficulty concentrating or making decisions? 0  Walking or climbing stairs? 1  Comment due to knees  Dressing or bathing? 0  Doing errands, shopping? 0  Preparing Food and eating ? N  Using the Toilet? N  In the past six months, have you accidently leaked urine? N  Do you have problems with loss of bowel control? N  Managing your Medications? N  Managing your Finances? N  Housekeeping or managing your Housekeeping? N    Patient Care Team: Laneta Pintos, MD as PCP - General (Family Medicine) Gean Keels, MD as Consulting Physician  (Family Medicine) Sonja Fults, MD as Consulting Physician (Hematology) Joyce Nixon, MD as Consulting Physician (General Surgery)  Indicate any recent Medical Services you may have received from other than [facility] providers in the past year (date may be approximate).     Assessment:    This is a routine wellness examination for Marianela.  Hearing/Vision screen Hearing Screening - Comments:: Denies hearing issues Vision Screening - Comments:: Regular eye exams, [facility]   Goals Addressed             This Visit's Progress    Patient Stated       01/09/2024, eat healthy, stay mobile       Depression Screen     01/09/2024    3:50 PM 03/22/2023   10:57 AM 12/20/2022    2:10 PM 06/07/2022    3:12 PM 03/07/2022    3:33 PM 12/06/2021    3:04 PM 11/23/2021    3:46 PM  PHQ 2/9 Scores  PHQ - 2 Score 0 0 0 0 0 0 0  PHQ- 9 Score 3 6  3 3 2 3     Fall Risk     01/09/2024    3:48 PM 03/22/2023   10:58 AM 12/20/2022    1:05 PM 09/07/2022   10:42 AM 12/06/2021    9:24 AM  Fall Risk   Falls in the past year? 1 1 0 0 0  Comment tripped      Number falls in past yr: 0 0  0   Injury with Fall? 0 1  0   Risk for fall due to : Medication side effect Other (Comment)     Follow up Falls evaluation completed;Falls prevention discussed Falls evaluation completed       MEDICARE RISK AT HOME:  Medicare Risk at Home Any stairs in or around the home?: Yes If so, are there any without handrails?: No Home free of loose throw rugs in walkways, pet beds, electrical cords, etc?: Yes Adequate lighting in your home to reduce risk of falls?: Yes Life alert?: No Use of a cane, walker  or w/c?: No Grab bars in the bathroom?: No Shower chair or bench in shower?: No Elevated toilet seat or a handicapped toilet?: Yes  TIMED UP AND GO:  Was the test performed?  No  Cognitive Function: 6CIT completed        01/09/2024    3:51 PM 12/20/2022    2:17 PM 12/06/2021    2:56 PM 11/21/2020   10:01 AM  12/15/2018    8:17 AM  6CIT Screen  What Year? 0 points 0 points 0 points 0 points 0 points  What month? 0 points 0 points 0 points 0 points 0 points  What time? 0 points 0 points 0 points 0 points 0 points  Count back from 20 0 points 0 points 0 points 0 points 0 points  Months in reverse 0 points 0 points 0 points 0 points 0 points  Repeat phrase 0 points 0 points 2 points 0 points 0 points  Total Score 0 points 0 points 2 points 0 points 0 points    Immunizations Immunization History  Administered Date(s) Administered   Influenza, High Dose Seasonal PF 07/23/2017, 06/26/2018   PFIZER(Purple Top)SARS-COV-2 Vaccination 09/24/2019, 10/19/2019, 06/26/2020, 03/06/2021   Pneumococcal Conjugate-13 12/27/2016   Pneumococcal Polysaccharide-23 01/19/2009   Td 05/17/2004    Screening Tests Health Maintenance  Topic Date Due   Zoster Vaccines- Shingrix (1 of 2) Never done   DTaP/Tdap/Td (2 - Tdap) 05/17/2014   COVID-19 Vaccine (5 - 2024-25 season) 04/14/2023   INFLUENZA VACCINE  03/13/2024   Medicare Annual Wellness (AWV)  01/08/2025   Pneumonia Vaccine 79+ Years old  Completed   DEXA SCAN  Completed   HPV VACCINES  Aged Out   Meningococcal B Vaccine  Aged Out    Health Maintenance  Health Maintenance Due  Topic Date Due   Zoster Vaccines- Shingrix (1 of 2) Never done   DTaP/Tdap/Td (2 - Tdap) 05/17/2014   COVID-19 Vaccine (5 - 2024-25 season) 04/14/2023   Health Maintenance Items Addressed: Declines shingrix. Due for TDAP and covid vaccine  Additional Screening:  Vision Screening: Recommended annual ophthalmology exams for early detection of glaucoma and other disorders of the eye.  Dental Screening: Recommended annual dental exams for proper oral hygiene  Community Resource Referral / Chronic Care Management: CRR required this visit?  No   CCM required this visit?  No   Plan:    I have personally reviewed and noted the following in the patient's chart:   Medical  and social history Use of alcohol, tobacco or illicit drugs  Current medications and supplements including opioid prescriptions. Patient is not currently taking opioid prescriptions. Functional ability and status Nutritional status Physical activity Advanced directives List of other physicians Hospitalizations, surgeries, and ER visits in previous 12 months Vitals Screenings to include cognitive, depression, and falls Referrals and appointments  In addition, I have reviewed and discussed with patient certain preventive protocols, quality metrics, and best practice recommendations. A written personalized care plan for preventive services as well as general preventive health recommendations were provided to patient.   Areatha Beecham, LPN   1/61/0960   After Visit Summary: (MyChart) Due to this being a telephonic visit, the after visit summary with patients personalized plan was offered to patient via MyChart   Notes: Nothing significant to report at this time.

## 2024-04-16 ENCOUNTER — Other Ambulatory Visit: Payer: Self-pay | Admitting: Family Medicine

## 2024-04-16 DIAGNOSIS — I10 Essential (primary) hypertension: Secondary | ICD-10-CM

## 2024-07-02 ENCOUNTER — Other Ambulatory Visit: Payer: Self-pay | Admitting: Family Medicine

## 2024-07-02 DIAGNOSIS — F430 Acute stress reaction: Secondary | ICD-10-CM
